# Patient Record
Sex: Female | Born: 1950 | Race: Black or African American | Hispanic: No | Marital: Single | Smoking: Current every day smoker
Health system: Southern US, Community
[De-identification: ages and names within clinical notes are randomized; demographics above are authoritative.]

## PROBLEM LIST (undated history)

## (undated) DIAGNOSIS — E119 Type 2 diabetes mellitus without complications: Secondary | ICD-10-CM

## (undated) DIAGNOSIS — G40909 Epilepsy, unspecified, not intractable, without status epilepticus: Secondary | ICD-10-CM

## (undated) DIAGNOSIS — R911 Solitary pulmonary nodule: Secondary | ICD-10-CM

## (undated) DIAGNOSIS — I10 Essential (primary) hypertension: Secondary | ICD-10-CM

## (undated) DIAGNOSIS — R569 Unspecified convulsions: Secondary | ICD-10-CM

## (undated) DIAGNOSIS — E785 Hyperlipidemia, unspecified: Secondary | ICD-10-CM

## (undated) DIAGNOSIS — I219 Acute myocardial infarction, unspecified: Secondary | ICD-10-CM

## (undated) DIAGNOSIS — C3490 Malignant neoplasm of unspecified part of unspecified bronchus or lung: Secondary | ICD-10-CM

## (undated) DIAGNOSIS — I2510 Atherosclerotic heart disease of native coronary artery without angina pectoris: Secondary | ICD-10-CM

## (undated) DIAGNOSIS — C50911 Malignant neoplasm of unspecified site of right female breast: Secondary | ICD-10-CM

## (undated) DIAGNOSIS — I82409 Acute embolism and thrombosis of unspecified deep veins of unspecified lower extremity: Secondary | ICD-10-CM

## (undated) DIAGNOSIS — C50919 Malignant neoplasm of unspecified site of unspecified female breast: Secondary | ICD-10-CM

## (undated) DIAGNOSIS — F419 Anxiety disorder, unspecified: Secondary | ICD-10-CM

## (undated) HISTORY — PX: IVC FILTER PLACEMENT (ARMC HX): HXRAD1551

## (undated) HISTORY — DX: Epilepsy, unspecified, not intractable, without status epilepticus: G40.909

## (undated) HISTORY — DX: Atherosclerotic heart disease of native coronary artery without angina pectoris: I25.10

## (undated) HISTORY — PX: LEG SURGERY: SHX1003

## (undated) HISTORY — DX: Malignant neoplasm of unspecified part of unspecified bronchus or lung: C34.90

## (undated) HISTORY — DX: Malignant neoplasm of unspecified site of right female breast: C50.911

## (undated) HISTORY — DX: Hyperlipidemia, unspecified: E78.5

## (undated) HISTORY — DX: Type 2 diabetes mellitus without complications: E11.9

## (undated) HISTORY — PX: ABDOMINAL HYSTERECTOMY: SHX81

## (undated) HISTORY — DX: Solitary pulmonary nodule: R91.1

## (undated) HISTORY — DX: Acute embolism and thrombosis of unspecified deep veins of unspecified lower extremity: I82.409

## (undated) HISTORY — DX: Acute myocardial infarction, unspecified: I21.9

## (undated) HISTORY — DX: Essential (primary) hypertension: I10

---

## 2005-02-18 ENCOUNTER — Emergency Department: Payer: Self-pay | Admitting: Emergency Medicine

## 2005-05-05 ENCOUNTER — Emergency Department: Payer: Self-pay | Admitting: Emergency Medicine

## 2005-05-10 ENCOUNTER — Emergency Department: Payer: Self-pay | Admitting: Emergency Medicine

## 2006-07-21 ENCOUNTER — Emergency Department: Payer: Self-pay | Admitting: Emergency Medicine

## 2007-02-19 ENCOUNTER — Emergency Department: Payer: Self-pay | Admitting: Emergency Medicine

## 2007-03-12 ENCOUNTER — Emergency Department: Payer: Self-pay | Admitting: Emergency Medicine

## 2007-04-12 ENCOUNTER — Emergency Department: Payer: Self-pay | Admitting: Emergency Medicine

## 2007-08-15 ENCOUNTER — Emergency Department: Payer: Self-pay | Admitting: Emergency Medicine

## 2007-10-17 ENCOUNTER — Ambulatory Visit: Payer: Self-pay | Admitting: Internal Medicine

## 2007-12-13 ENCOUNTER — Inpatient Hospital Stay: Payer: Self-pay | Admitting: Internal Medicine

## 2007-12-22 ENCOUNTER — Emergency Department: Payer: Self-pay | Admitting: Emergency Medicine

## 2007-12-24 ENCOUNTER — Emergency Department: Payer: Self-pay | Admitting: Emergency Medicine

## 2008-01-02 ENCOUNTER — Ambulatory Visit: Payer: Self-pay | Admitting: Vascular Surgery

## 2008-01-08 ENCOUNTER — Emergency Department: Payer: Self-pay | Admitting: Internal Medicine

## 2008-01-10 ENCOUNTER — Emergency Department: Payer: Self-pay | Admitting: Emergency Medicine

## 2008-04-24 ENCOUNTER — Emergency Department: Payer: Self-pay | Admitting: Emergency Medicine

## 2008-10-10 ENCOUNTER — Emergency Department: Payer: Self-pay | Admitting: Emergency Medicine

## 2009-05-06 ENCOUNTER — Emergency Department: Payer: Self-pay | Admitting: Emergency Medicine

## 2010-03-25 ENCOUNTER — Emergency Department: Payer: Self-pay | Admitting: Internal Medicine

## 2010-04-28 ENCOUNTER — Emergency Department: Payer: Self-pay | Admitting: Emergency Medicine

## 2010-07-22 ENCOUNTER — Emergency Department: Payer: Self-pay | Admitting: Emergency Medicine

## 2010-09-08 ENCOUNTER — Emergency Department: Payer: Self-pay | Admitting: Internal Medicine

## 2010-09-28 ENCOUNTER — Emergency Department: Payer: Self-pay | Admitting: Emergency Medicine

## 2010-09-29 ENCOUNTER — Emergency Department: Payer: Self-pay | Admitting: Emergency Medicine

## 2010-10-21 ENCOUNTER — Ambulatory Visit: Payer: Self-pay | Admitting: Internal Medicine

## 2010-10-29 ENCOUNTER — Emergency Department: Payer: Self-pay | Admitting: Emergency Medicine

## 2010-11-13 ENCOUNTER — Emergency Department: Payer: Self-pay | Admitting: Unknown Physician Specialty

## 2011-03-25 ENCOUNTER — Emergency Department: Payer: Self-pay | Admitting: Internal Medicine

## 2011-11-24 ENCOUNTER — Emergency Department: Payer: Self-pay | Admitting: *Deleted

## 2011-11-24 LAB — URINALYSIS, COMPLETE
Glucose,UR: NEGATIVE mg/dL (ref 0–75)
Ketone: NEGATIVE
Leukocyte Esterase: NEGATIVE
Nitrite: NEGATIVE
Protein: 30
RBC,UR: 7 /HPF (ref 0–5)
Squamous Epithelial: 12
WBC UR: 1 /HPF (ref 0–5)

## 2011-11-24 LAB — COMPREHENSIVE METABOLIC PANEL
Albumin: 3.6 g/dL (ref 3.4–5.0)
Calcium, Total: 8.8 mg/dL (ref 8.5–10.1)
Chloride: 103 mmol/L (ref 98–107)
Co2: 32 mmol/L (ref 21–32)
EGFR (African American): >60
Potassium: 3.7 mmol/L (ref 3.5–5.1)
Total Protein: 8 g/dL (ref 6.4–8.2)

## 2011-11-24 LAB — CBC
HCT: 44.7 % (ref 35.0–47.0)
HGB: 14.7 g/dL (ref 12.0–16.0)
MCH: 27.4 pg (ref 26.0–34.0)
MCHC: 32.8 g/dL (ref 32.0–36.0)
MCV: 84 fL (ref 80–100)
RBC: 5.35 10*6/uL — ABNORMAL HIGH (ref 3.80–5.20)
RDW: 15.9 % — ABNORMAL HIGH (ref 11.5–14.5)

## 2011-11-24 LAB — LIPASE, BLOOD: Lipase: 43 U/L — ABNORMAL LOW (ref 73–393)

## 2013-04-10 ENCOUNTER — Ambulatory Visit: Payer: Self-pay | Admitting: Podiatry

## 2013-04-11 ENCOUNTER — Emergency Department: Payer: Self-pay | Admitting: Emergency Medicine

## 2013-04-11 LAB — BASIC METABOLIC PANEL WITH GFR
Anion Gap: 5 — ABNORMAL LOW (ref 7–16)
BUN: 22 mg/dL — ABNORMAL HIGH (ref 7–18)
Calcium, Total: 8.7 mg/dL (ref 8.5–10.1)
Chloride: 107 mmol/L (ref 98–107)
Co2: 27 mmol/L (ref 21–32)
Creatinine: 0.95 mg/dL (ref 0.60–1.30)
EGFR (African American): >60
EGFR (Non-African Amer.): >60
Glucose: 181 mg/dL — ABNORMAL HIGH (ref 65–99)
Osmolality: 285 (ref 275–301)
Potassium: 3.2 mmol/L — ABNORMAL LOW (ref 3.5–5.1)
Sodium: 139 mmol/L (ref 136–145)

## 2013-04-11 LAB — PROTIME-INR
INR: 1.1
Prothrombin Time: 14 s (ref 11.5–14.7)

## 2013-04-11 LAB — APTT: Activated PTT: 28.8 s (ref 23.6–35.9)

## 2013-04-12 LAB — CBC WITH DIFFERENTIAL/PLATELET
Basophil %: 0.6 %
Eosinophil #: 0.3 10*3/uL (ref 0.0–0.7)
Eosinophil %: 2.8 %
Lymphocyte #: 2.7 10*3/uL (ref 1.0–3.6)
Lymphocyte %: 23.9 %
MCHC: 33.1 g/dL (ref 32.0–36.0)
MCV: 83 fL (ref 80–100)
Monocyte %: 11.1 %
Neutrophil #: 6.9 10*3/uL — ABNORMAL HIGH (ref 1.4–6.5)
Neutrophil %: 61.6 %
Platelet: 102 10*3/uL — ABNORMAL LOW (ref 150–440)
RBC: 4.8 10*6/uL (ref 3.80–5.20)
WBC: 11.2 10*3/uL — ABNORMAL HIGH (ref 3.6–11.0)

## 2013-04-12 LAB — HEPATIC FUNCTION PANEL A (ARMC)
Albumin: 2.7 g/dL — ABNORMAL LOW (ref 3.4–5.0)
Bilirubin, Direct: <0.1 mg/dL (ref 0.00–0.20)
SGOT(AST): 13 U/L — ABNORMAL LOW (ref 15–37)
Total Protein: 6.7 g/dL (ref 6.4–8.2)

## 2013-05-30 ENCOUNTER — Encounter: Payer: Self-pay | Admitting: *Deleted

## 2013-05-31 ENCOUNTER — Ambulatory Visit (INDEPENDENT_AMBULATORY_CARE_PROVIDER_SITE_OTHER): Payer: Medicare Other | Admitting: Podiatry

## 2013-05-31 ENCOUNTER — Encounter: Payer: Self-pay | Admitting: Podiatry

## 2013-05-31 VITALS — BP 154/79 | HR 83 | Resp 16 | Ht 65.0 in | Wt 210.0 lb

## 2013-05-31 DIAGNOSIS — M2040 Other hammer toe(s) (acquired), unspecified foot: Secondary | ICD-10-CM

## 2013-05-31 DIAGNOSIS — E1149 Type 2 diabetes mellitus with other diabetic neurological complication: Secondary | ICD-10-CM

## 2013-05-31 DIAGNOSIS — M7660 Achilles tendinitis, unspecified leg: Secondary | ICD-10-CM

## 2013-05-31 DIAGNOSIS — E1140 Type 2 diabetes mellitus with diabetic neuropathy, unspecified: Secondary | ICD-10-CM | POA: Insufficient documentation

## 2013-05-31 DIAGNOSIS — M722 Plantar fascial fibromatosis: Secondary | ICD-10-CM

## 2013-05-31 NOTE — Progress Notes (Signed)
Chamille presents today for her MRI read. 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 She has a retrocalcaneal spur. She states the majority of her pain is to the plantar left heel. She has friends who state she should just a diabetic shoe. She is not interested in surgical correction of the left heel at this time. She like to try diabetic shoe at all possible. She is a non-insulin-dependent diabetic. States that she is poorly controlled. Relates numbness and tingling in her toes at times.  Objective: Vital signs are stable she is alert and oriented x3. Pulses are palpable. Capillary fill time to digits one through 5 of the bilateral foot is immediate. Decreases sorry per Semmes-Weinstein monofilament to the bilateral foot. The tendon reflexes are intact. Skin is intact. Tenderness on palpation the tendo Achilles at its insertion site left heel. Tenderness on palpation in the tubercle the left heel. Hammertoe deformities 1 through 5 bilateral foot.  Assessment non-insulin-dependent diabetes mellitus poorly controlled with diabetic peripheral neuropathy. Hammertoe deformity. Tendo Achilles tendinitis.  Plan: We discussed etiology pathology conservative versus surgical therapies. We discussed the risks of not performing surgery. At this point in time we did go ahead and order custom molded diabetic shoes. And I will followup with her once the cement.

## 2013-12-04 ENCOUNTER — Emergency Department: Payer: Self-pay | Admitting: Emergency Medicine

## 2014-05-03 ENCOUNTER — Encounter: Payer: Self-pay | Admitting: *Deleted

## 2014-05-03 NOTE — Progress Notes (Signed)
Diabetic shoes not approved by family doctor. Had tried contacting pt with post card and phone call. No answer on why shoes may not be approved. Pt will need to come back into office if want to try for diabetic shoes and also will need to be scanned for inserts.

## 2014-08-01 LAB — APTT: Activated PTT: 26.2 secs (ref 23.6–35.9)

## 2014-08-01 LAB — CBC
HCT: 46.2 % (ref 35.0–47.0)
HGB: 15 g/dL (ref 12.0–16.0)
MCH: 27.2 pg (ref 26.0–34.0)
MCHC: 32.4 g/dL (ref 32.0–36.0)
MCV: 84 fL (ref 80–100)
Platelet: 158 10*3/uL (ref 150–440)
RBC: 5.51 10*6/uL — ABNORMAL HIGH (ref 3.80–5.20)
RDW: 16 % — ABNORMAL HIGH (ref 11.5–14.5)
WBC: 9.9 10*3/uL (ref 3.6–11.0)

## 2014-08-01 LAB — PROTIME-INR
INR: 1.1
PROTHROMBIN TIME: 13.6 s (ref 11.5–14.7)

## 2014-08-01 LAB — BASIC METABOLIC PANEL
Anion Gap: 7 (ref 7–16)
BUN: 24 mg/dL — ABNORMAL HIGH (ref 7–18)
Calcium, Total: 8.8 mg/dL (ref 8.5–10.1)
Chloride: 100 mmol/L (ref 98–107)
Co2: 29 mmol/L (ref 21–32)
Creatinine: 0.97 mg/dL (ref 0.60–1.30)
EGFR (African American): >60
EGFR (Non-African Amer.): >60
GLUCOSE: 260 mg/dL — AB (ref 65–99)
Osmolality: 285 (ref 275–301)
Potassium: 3.4 mmol/L — ABNORMAL LOW (ref 3.5–5.1)
Sodium: 136 mmol/L (ref 136–145)

## 2014-08-01 LAB — TROPONIN I: TROPONIN-I: 0.08 ng/mL — AB

## 2014-08-01 LAB — PRO B NATRIURETIC PEPTIDE: B-Type Natriuretic Peptide: 226 pg/mL — ABNORMAL HIGH (ref 0–125)

## 2014-08-02 ENCOUNTER — Inpatient Hospital Stay: Payer: Self-pay | Admitting: Internal Medicine

## 2014-08-02 DIAGNOSIS — I2510 Atherosclerotic heart disease of native coronary artery without angina pectoris: Secondary | ICD-10-CM

## 2014-08-02 DIAGNOSIS — I10 Essential (primary) hypertension: Secondary | ICD-10-CM

## 2014-08-02 DIAGNOSIS — I214 Non-ST elevation (NSTEMI) myocardial infarction: Secondary | ICD-10-CM

## 2014-08-02 DIAGNOSIS — Z720 Tobacco use: Secondary | ICD-10-CM

## 2014-08-02 HISTORY — PX: CARDIAC CATHETERIZATION: SHX172

## 2014-08-02 HISTORY — PX: CORONARY ANGIOPLASTY: SHX604

## 2014-08-02 LAB — CBC WITH DIFFERENTIAL/PLATELET
BASOS PCT: 0.7 %
Basophil #: 0.1 10*3/uL (ref 0.0–0.1)
Eosinophil #: 0.2 10*3/uL (ref 0.0–0.7)
Eosinophil %: 2.6 %
HCT: 42.1 % (ref 35.0–47.0)
HGB: 13.9 g/dL (ref 12.0–16.0)
Lymphocyte #: 2 10*3/uL (ref 1.0–3.6)
Lymphocyte %: 24.2 %
MCH: 27.5 pg (ref 26.0–34.0)
MCHC: 32.9 g/dL (ref 32.0–36.0)
MCV: 84 fL (ref 80–100)
Monocyte #: 0.7 x10 3/mm (ref 0.2–0.9)
Monocyte %: 8.6 %
NEUTROS PCT: 63.9 %
Neutrophil #: 5.3 10*3/uL (ref 1.4–6.5)
Platelet: 146 10*3/uL — ABNORMAL LOW (ref 150–440)
RBC: 5.04 10*6/uL (ref 3.80–5.20)
RDW: 15.9 % — ABNORMAL HIGH (ref 11.5–14.5)
WBC: 8.2 10*3/uL (ref 3.6–11.0)

## 2014-08-02 LAB — TROPONIN I
Troponin-I: 0.1 ng/mL — ABNORMAL HIGH
Troponin-I: 0.19 ng/mL — ABNORMAL HIGH

## 2014-08-02 LAB — BASIC METABOLIC PANEL
ANION GAP: 7 (ref 7–16)
BUN: 20 mg/dL — ABNORMAL HIGH (ref 7–18)
CREATININE: 0.72 mg/dL (ref 0.60–1.30)
Calcium, Total: 8.7 mg/dL (ref 8.5–10.1)
Chloride: 101 mmol/L (ref 98–107)
Co2: 28 mmol/L (ref 21–32)
EGFR (Non-African Amer.): >60
Glucose: 285 mg/dL — ABNORMAL HIGH (ref 65–99)
OSMOLALITY: 285 (ref 275–301)
POTASSIUM: 3.2 mmol/L — AB (ref 3.5–5.1)
Sodium: 136 mmol/L (ref 136–145)

## 2014-08-02 LAB — CK-MB
CK-MB: 0.8 ng/mL (ref 0.5–3.6)
CK-MB: 1.1 ng/mL (ref 0.5–3.6)
CK-MB: 1.3 ng/mL (ref 0.5–3.6)

## 2014-08-03 LAB — BASIC METABOLIC PANEL
Anion Gap: 6 — ABNORMAL LOW (ref 7–16)
BUN: 14 mg/dL (ref 7–18)
CALCIUM: 8.2 mg/dL — AB (ref 8.5–10.1)
CHLORIDE: 108 mmol/L — AB (ref 98–107)
CREATININE: 0.68 mg/dL (ref 0.60–1.30)
Co2: 27 mmol/L (ref 21–32)
Glucose: 164 mg/dL — ABNORMAL HIGH (ref 65–99)
Osmolality: 285 (ref 275–301)
POTASSIUM: 3.3 mmol/L — AB (ref 3.5–5.1)
Sodium: 141 mmol/L (ref 136–145)

## 2014-08-03 LAB — CK TOTAL AND CKMB (NOT AT ARMC): CK, Total: 138 U/L (ref 26–192)

## 2014-08-03 LAB — CK-MB
CK-MB: 12.4 ng/mL — ABNORMAL HIGH (ref 0.5–3.6)
CK-MB: 12.6 ng/mL — AB (ref 0.5–3.6)

## 2014-08-03 LAB — TROPONIN I: Troponin-I: 4.8 ng/mL — ABNORMAL HIGH

## 2014-08-05 ENCOUNTER — Encounter: Payer: Self-pay | Admitting: Cardiovascular Disease

## 2014-08-06 ENCOUNTER — Encounter: Payer: Self-pay | Admitting: *Deleted

## 2014-08-08 ENCOUNTER — Encounter: Payer: Medicare Other | Admitting: Cardiovascular Disease

## 2014-08-08 ENCOUNTER — Encounter (INDEPENDENT_AMBULATORY_CARE_PROVIDER_SITE_OTHER): Payer: Self-pay

## 2014-08-08 ENCOUNTER — Encounter: Payer: Self-pay | Admitting: Cardiovascular Disease

## 2014-08-08 VITALS — BP 130/82 | Ht 65.0 in | Wt 213.5 lb

## 2014-08-14 ENCOUNTER — Ambulatory Visit (INDEPENDENT_AMBULATORY_CARE_PROVIDER_SITE_OTHER): Payer: Medicare Other | Admitting: Physician Assistant

## 2014-08-14 ENCOUNTER — Encounter: Payer: Self-pay | Admitting: Physician Assistant

## 2014-08-14 ENCOUNTER — Telehealth: Payer: Self-pay | Admitting: *Deleted

## 2014-08-14 VITALS — BP 128/84 | HR 75 | Ht 65.0 in | Wt 210.2 lb

## 2014-08-14 DIAGNOSIS — I252 Old myocardial infarction: Secondary | ICD-10-CM

## 2014-08-14 DIAGNOSIS — E785 Hyperlipidemia, unspecified: Secondary | ICD-10-CM

## 2014-08-14 DIAGNOSIS — T148XXA Other injury of unspecified body region, initial encounter: Secondary | ICD-10-CM

## 2014-08-14 DIAGNOSIS — I10 Essential (primary) hypertension: Secondary | ICD-10-CM

## 2014-08-14 DIAGNOSIS — I82402 Acute embolism and thrombosis of unspecified deep veins of left lower extremity: Secondary | ICD-10-CM

## 2014-08-14 DIAGNOSIS — I2510 Atherosclerotic heart disease of native coronary artery without angina pectoris: Secondary | ICD-10-CM

## 2014-08-14 NOTE — Progress Notes (Signed)
This encounter was created in error - please disregard.

## 2014-08-14 NOTE — Telephone Encounter (Signed)
Attempted to call patient at [phone] to inform patient her ultrasound is scheduled for 08/19/14 at 3 pm at the imaging center on [address]   LVM

## 2014-08-14 NOTE — Progress Notes (Signed)
Patient Name: Dawn Allen, Dawn Allen 1950-12-21, MRN [id number]  Date of Encounter: 08/14/2014  Primary Care Provider:  Lorelee Market, MD Primary Cardiologist:  Dr. Fletcher Anon, MD  Patient Profile:  63 y.o. female with history CAD with history of NSTEMI 07/2014 s/p PCI/DES to mRCA, HTN, HLD, DM2 presents for hospital follow up after recent admission to [facility] from 12/11-12/12 for NSTEMI.    Problem List:   Past Medical History  Diagnosis Date  . Hypertension   . DVT (deep venous thrombosis)     LEFT LEG   . MI (myocardial infarction)   . Diabetes mellitus without complication   . Seizure disorder   . Coronary artery disease     a. NSTEMI cath 08/02/14: LM nl, pLAD 50%, mLCx 30%, mRCA 95% s/p PCI/DES, 2nd lesion 40%, EF 55%  . HLD (hyperlipidemia)    Past Surgical History  Procedure Laterality Date  . Abdominal hysterectomy      PARTIAL   . Leg surgery Right     BLOOD CLOT  . Cardiac catheterization  08/02/2014  . Coronary angioplasty  08/02/2014    drug eluting stent placement     Allergies:  Allergies  Allergen Reactions  . Penicillins      HPI:  63 y.o. female with the above problem list presents for hospital follow up with recently diagnosed CAD s/p NSTEMI s/p PCI/DES to [facility] on 08/02/14. She also has history of HTN, HLD, DM2, recurrent DVT, and seizure disorder. Patient was without any prior known cardiac history. She presented to [facility] ED on 08/02/14 with substernal chest tightness that radiated to her left arm that was associated with SOB. Troponin peaked at 0.19. EKG was NSR with possible old inferior infarct. Cardiac cath showed 95% stenosis of mRCA s/p PCI/DES (see details in [place]).   She feels well today. She does have some bruising along the right radial wrist that is stable per her report. No knots or bleeding. Full ROM of the wrist and arm. No chest pain or SOB. She is taking all of her medications as prescribed. She is advancing her activity as tolerated  without issues.    Home Medications:  Prior to Admission medications   Medication Sig Start Date End Date Taking? Authorizing Provider  atorvastatin (LIPITOR) 40 MG tablet Take 40 mg by mouth daily.    Historical Provider, MD  carvedilol (COREG) 25 MG tablet Take 25 mg by mouth 2 (two) times daily with a meal.     Historical Provider, MD  clopidogrel (PLAVIX) 75 MG tablet Take 75 mg by mouth daily.    Historical Provider, MD  lisinopril-hydrochlorothiazide (PRINZIDE,ZESTORETIC) 20-25 MG per tablet Take 1 tablet by mouth daily.  07/26/14   Historical Provider, MD  metFORMIN (GLUCOPHAGE) 1000 MG tablet Take 1,000 mg by mouth 2 (two) times daily with a meal.    Historical Provider, MD  metoprolol tartrate (LOPRESSOR) 25 MG tablet Take 25 mg by mouth 2 (two) times daily.    Historical Provider, MD  nitroGLYCERIN (NITROSTAT) 0.4 MG SL tablet Place 0.4 mg under the tongue every 5 (five) minutes as needed for chest pain.    Historical Provider, MD  phenytoin (DILANTIN) 100 MG ER capsule Take 300 mg by mouth daily.     Historical Provider, MD  Rivaroxaban (XARELTO) 20 MG TABS tablet Take by mouth daily.    Historical Provider, MD     Weights: Filed Weights   08/14/14 1433  Weight: 210 lb 4 oz (95.369 kg)  Review of Systems:  As above. All other systems reviewed and are otherwise negative except as noted above.  Physical Exam:  Blood pressure 128/84, pulse 75, height 5\' 5"  (1.651 m), weight 210 lb 4 oz (95.369 kg).  General: Pleasant, NAD Psych: Normal affect. Neuro: Alert and oriented X 3. Moves all extremities spontaneously. HEENT: Normal  Neck: Supple without bruits or JVD. Lungs:  Resp regular and unlabored, CTA. Heart: RRR no s3, s4, or murmurs. Abdomen: Soft, non-tender, non-distended, BS + x 4.  Extremities: No clubbing, cyanosis or edema. Right radial wrist without bruits. 2+ pulse along right radial wrist. Resolving bruise along volar aspect of right radial wrist.     Accessory Clinical Findings:  EKG - NSR, 75, poor R wave progression, old inferior infarct, no significant st/t changes  Assessment & Plan:  1. CAD with history of NSTEMI 07/2014 s/p PCI/DES to mRCA: -No further chest pain or SOB -Continue Plavix 75 mg for at least 12 months (on Xarelto rather than aspirin 2/2 recurrent DVT) -Lopressor 25 mg bid -SL NTG prn -Cardiac rehab -Smoking cessation   2. Right radial wrist bruising: -Bruising appears to be resolving -Will check an upper extremity US to make sure -No bruits heard on exam/pulses intact  -Warm compresses -Advised patient this will take time to resolve  3. HTN: -Lisinopril 20 mg  -Lopressor as above  4. HLD: -Lipitor 40 mg   5. DM2: -Metformin 1000 mg bid per PCP  6. Recurrent DVT: -Xarelto 20 mg  7. Seizure disorder: -Dilantin 100 mg 3 capsules daily    Christell Faith, PA-C [facility] [address], [place] [address] [phone] [place] 08/14/2014, 5:13 PM

## 2014-08-14 NOTE — Patient Instructions (Signed)
You are scheduled for a Ultrasound of your upper right extremity  Your procedure will be at 3 pm [facility] [address]     Your physician recommends that you continue on your current medications as directed. Please refer to the Current Medication list given to you today.  Your physician recommends that you schedule a follow-up appointment in:  1 month with Christell Faith PA or Dr. Fletcher Anon

## 2014-08-19 NOTE — Telephone Encounter (Signed)
Patient stated she could not make this appointment  She needs at least 3 days in advance to arrange transportation   Rescheduled patients ultrasound for 08/22/14 at 0900 at [facility]  Patient to arrive at [place]   Patient does not have working phone  She stated she will call later today for new date and time

## 2014-08-20 NOTE — Telephone Encounter (Signed)
What's the status on her getting this done?

## 2014-08-21 NOTE — Telephone Encounter (Signed)
LVM (with friend of patient as requested by patient) to inform patient of ultrasound date and time  Patient does not have working number

## 2014-08-22 ENCOUNTER — Telehealth: Payer: Self-pay

## 2014-08-22 ENCOUNTER — Ambulatory Visit: Payer: Self-pay | Admitting: Physician Assistant

## 2014-08-22 NOTE — Telephone Encounter (Signed)
Calling to let us know that pt is currently at registration

## 2014-08-28 NOTE — Telephone Encounter (Signed)
Patient stated US of her arm has been completed

## 2014-08-28 NOTE — Telephone Encounter (Signed)
LVM 1/6

## 2014-09-10 ENCOUNTER — Encounter: Payer: Self-pay | Admitting: Cardiovascular Disease

## 2014-12-14 NOTE — H&P (Signed)
PATIENT NAME:  Dawn Allen, Dawn Allen MR#:  [id number] DATE OF BIRTH:  Jun 01, 1951  DATE OF ADMISSION:  08/01/2014  PRIMARY CARE PHYSICIAN: Threasa Alpha, FNP-BC  REFERRING PHYSICIAN: Brunilda Payor A. Edd Fabian, MD    CHIEF COMPLAINT: Chest pain.   HISTORY OF PRESENT ILLNESS: Dawn Allen is a 64 year old female with a history of DVT, hypertension, hyperlipidemia, continued tobacco use comes to the Emergency Department with complaints of chest pain. The patient has been experiencing this pain since this afternoon. The pain is in the retrosternal area, center of the chest. The patient states washed her cloths, was putting the cloths on the drying line, started to experience pain of sudden onset, 10/10 in intensity. The pain is worse taking a deep breath. Denies having any cough, shortness of breath. Concerning the ongoing chest pain the patient presented to the Emergency Department, workup in the Emergency Department, with mild elevation of the troponin of 0.08. The patient denies any previous history of chest pain, any workup in regards to this. The patient currently denies having any chest pain.   PAST MEDICAL HISTORY:  1.  History of DVT, on chronic anticoagulation.  2.  Diabetes mellitus type 2.  3.  History of seizures.  4.  Hypertension.   PAST SURGICAL HISTORY: Partial hysterectomy.   ALLERGIES: PENICILLIN.   HOME MEDICATIONS:  1.  Xarelto 20 mg once a day.  2.  Metformin 1000 mg 2 times a day.  3.  Lisinopril 20 mg once a day.  4.  Dilantin 100 mg 3 capsules once a day.   SOCIAL HISTORY: Continues to smoke 1 pack a day. Denies drinking alcohol or using illicit drugs. Lives by herself. Currently not working.   FAMILY HISTORY: Strong family history of coronary artery disease, diabetes mellitus, hypertension.   REVIEW OF SYSTEMS:  CONSTITUTIONAL: Experiencing generalized weakness.  EYES: No change in vision.  EARS, NOSE, AND THROAT: No change in hearing.  RESPIRATORY: No cough, shortness of breath.   CARDIOVASCULAR: Has chest pain.  GASTROINTESTINAL: No nausea, vomiting, abdominal pain.  GENITOURINARY: No dysuria or hematuria.  HEMATOLOGIC: No easy bruising or bleeding.  SKIN: No rash or lesion.  MUSCULOSKELETAL: No joint pains and aches.  NEUROLOGIC: No weakness or numbness in any part of the body.   PHYSICAL EXAMINATION:  GENERAL: This of well-built, well-nourished, age-appropriate female laying down in the bed not in distress.  VITAL SIGNS: Temperature 97.8, pulse 78, blood pressure 171/115, respiratory rate of 19 oxygen saturation is 97% on room air.  HEENT: Head normocephalic, atraumatic. There is no scleral icterus. Conjunctivae normal. Pupils equal and react to light. Mucous membranes moist. No pharyngeal erythema.  NECK: Supple. No lymphadenopathy. No JVD. No carotid bruit. No thyromegaly.  CHEST: Focal tenderness on the left parasternal area.  LUNGS: Bilaterally clear to auscultation.  HEART: S1, S2 regular. No murmurs are heard.  ABDOMEN: Bowel sounds plus. Soft, nontender, nondistended. No hepatosplenomegaly.  EXTREMITIES: No pedal edema. Pulses 2+.  NEUROLOGIC: The patient is alert, oriented to place, person, and time. Cranial nerves II through XII intact. Motor 5/5 in upper and lower extremities.   SKIN: No rash or lesions.  MUSCULOSKELETAL: Good range of motion in all the extremities.   LABORATORY DATA: Chest x-ray, one view, portable: No acute cardiopulmonary disease.   Troponin 0.08. CBC: WBC of 9.9, hemoglobin 15, platelet count of 158. CMP: BUN 24, creatinine of 0.97, potassium 3.4, glucose 260. Rest of all the values are within normal limits. BNP 226.   ASSESSMENT AND PLAN:  Dawn Allen is a 64 year old female who comes to the Emergency Department with complaints of chest pain.  1.  Chest pain. Initial set of cardiac enzymes 0.08, however, the patient has multiple risk factors, hypertension, hyperlipidemia, diabetes mellitus, and smoking, age and gender. Admit the  patient to the monitored bed, cycle cardiac enzymes x 3. If negative, consider getting a dobutamine  nuclear stress test.  2.  Hypertension, currently well controlled.  3.  Diabetes mellitus, on oral medications.  4.  Keep the patient on deep vein thrombosis prophylaxis with Xarelto.   TIME SPENT: 50 minutes.   ____________________________ Dawn Becton, MD pv:AT D: 08/02/2014 02:08:17 ET T: 08/02/2014 02:45:36 ET JOB#: [id number]  cc: Dawn Becton, MD, <Dictator> Dawn Becton MD ELECTRONICALLY SIGNED 08/02/2014 21:38

## 2014-12-14 NOTE — Consult Note (Signed)
Brief Consult Note: Diagnosis: NSTEMI.   Patient was seen by consultant.   Consult note dictated.   Recommend to proceed with surgery or procedure.   Comments: recommend cardiac cath today.  Electronic Signatures: Kathlyn Sacramento (MD)  (Signed 11-Dec-15 09:13)  Authored: Brief Consult Note   Last Updated: 11-Dec-15 09:13 by Kathlyn Sacramento (MD)

## 2014-12-14 NOTE — Discharge Summary (Signed)
PATIENT NAME:  Dawn Allen, Dawn Allen MR#:  [id number] DATE OF BIRTH:  03/11/1951  DATE OF ADMISSION:  08/02/2014 DATE OF DISCHARGE:  08/03/2014  DISCHARGE DIAGNOSES: 1.  Chest pain secondary to non-ST-elevation myocardial infarction status post cardiac with drug-eluting stent in right coronary artery.  2.  Hypertension.  3.  Type 2 diabetes mellitus.  4.  Seizure disorder. 5.  History of recurrent deep venous thromboses.   ALLERGIES: PENICILLIN.   DISCHARGE MEDICATIONS:  Xarelto 20 mg p.o. daily, lisinopril 20 mg p.o. daily, Dilantin 100 mg 3 capsules daily, metformin 1 gram p.o. b.i.d.  Start the metformin on 14th because of cardiac cath, Plavix 75 mg p.o. daily, lisinopril 20 mg p.o. daily, metoprolol tartrate 25 mg p.o. b.i.d. atorvastatin 40 mg p.o. daily, nitroglycerin sublingual p.r.n. for chest pain.  Metoprolol, statins and Plavix are new medications and also nitroglycerin is new. We gave her 20 tablets of nitroglycerin in case she gets chest pain.   PRIMARY DOCTOR: Meindert A. Brunetta Genera, MD.  CARDIOLOGIST CONSULTRogue Jury A. Fletcher Anon, MD.   DIET: Low-sodium, low-fat diet ADA diet.   DISCHARGE INSTRUCTION:  The patient advised to stop metformin until  December 13 and start on December 14.   HOSPITAL COURSE:  1.  This is a 64 year old female patient who came in because of chest pain. The patient noticed sudden onset of chest pain associated with deep breath.  Please look at history and physical for full details.  The patient's chest x-ray was normal, and troponin 1st one was 0.08. She has multiple risk factors hypertension, hyperlipidemia, diabetes, and smoking, so she is admitted to telemetry and patient's 2nd troponin was 0.08, 3rd one 0.10.  The patient was seen by cardiology and the patient was taken to cardiac cath on December 11.  Cardiac cath revealed EF 55% and 95% lesion in the mid RCA. The patient had a drug-eluting stent in RCA and was transferred to ICU for post PCI monitoring. The  patient received IV fluids for her cardiac cath and she was monitored post PCI in ICU.  Her symptoms are improved.  Denies any chest pain. The patient started on Plavix and she takes Xarelto for recurrent DVTs.  So, Dr. Fletcher Anon mentioned that the patient can take Plavix and Xarelto and we cannot give aspirin because of bleeding risk with 3 agents.  Also, same is advised to the patient.  The patient advised to continue Plavix and see Dr. Fletcher Anon in a week. She can also continue metoprolol and lisinopril for high blood pressure and we have added simvastatin 40 mg daily.  2.  Diabetes mellitus type 2.  She takes metformin at home. She can start it on December 14.  3.  History of seizure disorder.  She has a history of petit mal seizures, takes Dilantin for a long time and she says she does not want to stop it and sees Dr. Brunetta Genera for that. Advised to continue Dilantin and also advised to follow up with neurologist.  3.  History of recurrent deep venous thromboses.  Xarelto 20 mg p.o. daily. Advised to continue that.   LABORATORY DATA: The patient's white count is normal. CT of chest  is done to evaluate pulmonary emboli. The patient's CT chest is negative for PE and 2 small pulmonary nodules are present in left upper lobe. The patient can have a repeat CT in 3-6 months.  Potassium 3.2 and she can get the replacement for that.  PHYSICAL EXAMINATION: VITAL SIGNS:  The patient's discharge  blood pressure is 134/58, heart rate 89, temperature 98.4,  saturation 98% on room air.  GENERAL: She is alert, awake, and oriented, answering questions appropriately.  CARDIOVASCULAR: S1, S2 regular.  LUNGS: Clear to auscultation. No wheeze. No rales.  ABDOMEN: Soft, nontender, nondistended. Bowel sounds present.  NEUROLOGIC: Alert, awake, oriented.  No focal neurological deficit.   TIME SPENT ON DISCHARGE PREPARATION: More than 30 minutes.   ____________________________ Epifanio Lesches, MD sk:DT D: 08/03/2014  08:38:00 ET T: 08/03/2014 13:35:10 ET JOB#: [id number]  cc: Muhammad A. Fletcher Anon, MD Meindert A. Brunetta Genera, MD Epifanio Lesches, MD, <Dictator>   Epifanio Lesches MD ELECTRONICALLY SIGNED 08/06/2014 23:17

## 2014-12-18 NOTE — Consult Note (Signed)
PATIENT NAME:  Dawn Allen, Dawn Allen MR#:  [id number] DATE OF BIRTH:  November 30, 1950  DATE OF CONSULTATION:  08/02/2014  REFERRING PHYSICIAN:  Azucena Freed, MD CONSULTING PHYSICIAN:  Aubery Date A. Fletcher Anon, MD  REASON FOR CONSULTATION: Myocardial infarction.   HISTORY OF PRESENT ILLNESS: This is a 64 year old African American female with no previous cardiac history. She has known history of DVT, hypertension, hyperlipidemia, tobacco use and type 2 diabetes. She presented to the Emergency Room after she had chest pain yesterday around noon. It was substernal chest tightness with radiation to the left arm. It was intense in severity and lasted for more than 1 hour. She had associated shortness of breath and she felt like she was having a panic attack. She was noted to have mildly elevated troponin at 0.08 initially, which increased to 0.19. EKG showed sinus rhythm with possible old inferior infarct. She is currently chest pain-free.   PAST MEDICAL HISTORY: 1.  DVT, on chronic anticoagulation.  2.  Type 2 diabetes.  3.  Seizures.  4.  Hypertension.   PAST SURGICAL HISTORY: Includes partial hysterectomy.   ALLERGIES: PENICILLIN.   HOME MEDICATIONS: 1.  Xarelto 20 mg daily.  2.  Metformin 1000 mg twice daily.  3.  Lisinopril 20 mg once daily.  4.  Dilantin 300 mg once daily.   SOCIAL HISTORY: Remarkable for smoking 1 pack per day. She denies any alcohol or recreational drug use.   FAMILY HISTORY: Remarkable for coronary artery disease, diabetes and hypertension.   REVIEW OF SYSTEMS: A 10 point review was performed. It is negative other than what is mentioned in the HPI.  PHYSICAL EXAMINATION: GENERAL: The patient appears to be at her stated age and in no acute distress.  VITAL SIGNS: Blood pressure on presentation was elevated at 187/96; currently it is 144/84. Temperature is 98.2, pulse 90, and oxygen saturation is 97% on room air.  HEENT: Normocephalic, atraumatic.  NECK: No JVD or carotid  bruits.  CARDIOVASCULAR: Normal PMI. Normal S1 and S2 with no gallops. There is a 2/6 systolic ejection murmur at the aortic area.  ABDOMEN: Benign, nontender, and nondistended.  EXTREMITIES: No clubbing, cyanosis, or edema.  SKIN: Warm and dry with no rash.  PSYCHIATRIC: She is alert and oriented x3 with normal mood and affect.   DIAGNOSTIC DATA: EKG shows sinus rhythm with possible old inferior MI with Q waves.   Labs show a BNP of 226. Kidney function is normal. Troponin is 0.19. CBC is unremarkable, except for mild thrombocytopenia at 146.   CT of the chest showed no evidence of pulmonary embolism.   IMPRESSION: 1.  Non-ST elevation myocardial infarction.  2.  Uncontrolled hypertension.  3.  Tobacco use.  4.  Type 2 diabetes.   RECOMMENDATIONS: The patient's presentation is consistent with a small non-ST elevation myocardial infarction. I recommend continuing treatment with aspirin. I added metoprolol and atorvastatin. I recommend proceeding with cardiac catheterization and possible coronary intervention. Risks, benefits and alternatives were discussed with the patient. Further recommendations to follow after cardiac cath.   ____________________________ Mertie Clause. Fletcher Anon, MD maa:sb D: 08/02/2014 09:18:39 ET T: 08/02/2014 10:11:14 ET JOB#: [id number]  cc: Rogue Jury A. Fletcher Anon, MD, <Dictator> Wellington Hampshire MD ELECTRONICALLY SIGNED 08/23/2014 9:38

## 2015-02-05 ENCOUNTER — Emergency Department
Admission: EM | Admit: 2015-02-05 | Discharge: 2015-02-06 | Disposition: A | Discharge status: Home / Self Care | Payer: Medicare Other | Attending: Emergency Medicine | Admitting: Emergency Medicine

## 2015-02-05 ENCOUNTER — Encounter: Payer: Self-pay | Admitting: Emergency Medicine

## 2015-02-05 DIAGNOSIS — Z880 Allergy status to penicillin: Secondary | ICD-10-CM | POA: Diagnosis not present

## 2015-02-05 DIAGNOSIS — R1031 Right lower quadrant pain: Secondary | ICD-10-CM | POA: Diagnosis present

## 2015-02-05 DIAGNOSIS — Z79899 Other long term (current) drug therapy: Secondary | ICD-10-CM | POA: Insufficient documentation

## 2015-02-05 DIAGNOSIS — Z7901 Long term (current) use of anticoagulants: Secondary | ICD-10-CM | POA: Insufficient documentation

## 2015-02-05 DIAGNOSIS — K5730 Diverticulosis of large intestine without perforation or abscess without bleeding: Secondary | ICD-10-CM

## 2015-02-05 DIAGNOSIS — Z7902 Long term (current) use of antithrombotics/antiplatelets: Secondary | ICD-10-CM | POA: Diagnosis not present

## 2015-02-05 DIAGNOSIS — E1149 Type 2 diabetes mellitus with other diabetic neurological complication: Secondary | ICD-10-CM | POA: Diagnosis not present

## 2015-02-05 DIAGNOSIS — Z87891 Personal history of nicotine dependence: Secondary | ICD-10-CM | POA: Diagnosis not present

## 2015-02-05 DIAGNOSIS — I10 Essential (primary) hypertension: Secondary | ICD-10-CM | POA: Insufficient documentation

## 2015-02-05 DIAGNOSIS — R109 Unspecified abdominal pain: Secondary | ICD-10-CM

## 2015-02-05 NOTE — ED Notes (Signed)
Pt presents to ED via EMS with c/o RLQ abdominal pain radiates to flank, onset about a year, worse today. Pt was seen by her Dr. Today and discharge home.

## 2015-02-06 ENCOUNTER — Encounter: Payer: Self-pay | Admitting: Radiology

## 2015-02-06 ENCOUNTER — Emergency Department: Payer: Medicare Other

## 2015-02-06 DIAGNOSIS — K5730 Diverticulosis of large intestine without perforation or abscess without bleeding: Secondary | ICD-10-CM | POA: Diagnosis not present

## 2015-02-06 LAB — URINALYSIS COMPLETE WITH MICROSCOPIC (ARMC ONLY)
BACTERIA UA: NONE SEEN
Bilirubin Urine: NEGATIVE
Glucose, UA: NEGATIVE mg/dL
Ketones, ur: NEGATIVE mg/dL
LEUKOCYTES UA: NEGATIVE
Nitrite: NEGATIVE
Protein, ur: NEGATIVE mg/dL
Specific Gravity, Urine: 1.003 — ABNORMAL LOW (ref 1.005–1.030)
pH: 7 (ref 5.0–8.0)

## 2015-02-06 LAB — COMPREHENSIVE METABOLIC PANEL
ALT: 24 U/L (ref 14–54)
ANION GAP: 8 (ref 5–15)
AST: 20 U/L (ref 15–41)
Albumin: 3.4 g/dL — ABNORMAL LOW (ref 3.5–5.0)
Alkaline Phosphatase: 245 U/L — ABNORMAL HIGH (ref 38–126)
BUN: 16 mg/dL (ref 6–20)
CO2: 27 mmol/L (ref 22–32)
Calcium: 8.6 mg/dL — ABNORMAL LOW (ref 8.9–10.3)
Chloride: 99 mmol/L — ABNORMAL LOW (ref 101–111)
Creatinine, Ser: 0.78 mg/dL (ref 0.44–1.00)
GFR calc Af Amer: >60 mL/min (ref >60)
GFR calc non Af Amer: >60 mL/min (ref >60)
Glucose, Bld: 191 mg/dL — ABNORMAL HIGH (ref 65–99)
POTASSIUM: 3.1 mmol/L — AB (ref 3.5–5.1)
SODIUM: 134 mmol/L — AB (ref 135–145)
TOTAL PROTEIN: 7.2 g/dL (ref 6.5–8.1)
Total Bilirubin: 0.3 mg/dL (ref 0.3–1.2)

## 2015-02-06 MED ORDER — POLYETHYLENE GLYCOL 3350 17 G PO PACK
17.0000 g | PACK | Freq: Every day | ORAL | Status: DC
  Administered 2015-02-06: 17 g via ORAL
  Filled 2015-02-06 (×2): qty 1

## 2015-02-06 MED ORDER — KETOROLAC TROMETHAMINE 10 MG PO TABS: 10.0000 mg | ORAL_TABLET | Freq: Once | ORAL | Status: DC

## 2015-02-06 MED ORDER — SODIUM CHLORIDE 0.9 % IV BOLUS (SEPSIS)
1000.0000 mL | Freq: Once | INTRAVENOUS | Status: AC
  Administered 2015-02-06: 1000 mL via INTRAVENOUS

## 2015-02-06 MED ORDER — IOHEXOL 300 MG/ML  SOLN
100.0000 mL | Freq: Once | INTRAMUSCULAR | Status: AC | PRN
  Administered 2015-02-06: 100 mL via INTRAVENOUS

## 2015-02-06 MED ORDER — ONDANSETRON HCL 4 MG/2ML IJ SOLN
INTRAMUSCULAR | Status: AC
  Administered 2015-02-06: 4 mg via INTRAVENOUS
  Filled 2015-02-06: qty 2

## 2015-02-06 MED ORDER — ONDANSETRON HCL 4 MG/2ML IJ SOLN
4.0000 mg | Freq: Once | INTRAMUSCULAR | Status: AC
  Administered 2015-02-06: 4 mg via INTRAVENOUS

## 2015-02-06 MED ORDER — MORPHINE SULFATE 4 MG/ML IJ SOLN
4.0000 mg | Freq: Once | INTRAMUSCULAR | Status: AC
  Administered 2015-02-06: 4 mg via INTRAVENOUS

## 2015-02-06 MED ORDER — IOHEXOL 240 MG/ML SOLN
25.0000 mL | Freq: Once | INTRAMUSCULAR | Status: AC | PRN
  Administered 2015-02-06: 25 mL via ORAL

## 2015-02-06 MED ORDER — MORPHINE SULFATE 4 MG/ML IJ SOLN
INTRAMUSCULAR | Status: AC
  Administered 2015-02-06: 4 mg via INTRAVENOUS
  Filled 2015-02-06: qty 1

## 2015-02-06 NOTE — ED Notes (Signed)
CT notified that pt finished with contrast. 

## 2015-02-06 NOTE — ED Provider Notes (Signed)
[facility] Emergency Department Provider Note  ____________________________________________  Time seen: 11:45 PM  I have reviewed the triage vital signs and the nursing notes.   HISTORY  Chief Complaint Abdominal Pain      HPI Dawn Allen is a 64 y.o. female presents with bilateral lower quadrant pains times one year with acute worsening today. Of note patient saw her primary care physician today Dr. Brunetta Genera. Patient denied nausea no vomiting no diarrhea and no constipation. Patient denies any dysuria or urinary urgency or frequency.Patient current pain score is 10 out of 10. She denies any aggravating or alleviating factors.     Past Medical History  Diagnosis Date  . Hypertension   . DVT (deep venous thrombosis)     LEFT LEG   . MI (myocardial infarction)   . Diabetes mellitus without complication   . Seizure disorder   . Coronary artery disease     a. NSTEMI cath 08/02/14: LM nl, pLAD 50%, mLCx 30%, mRCA 95% s/p PCI/DES, 2nd lesion 40%, EF 55%  . HLD (hyperlipidemia)     Patient Active Problem List   Diagnosis Date Noted  . Coronary artery disease   . Hypertension   . DVT (deep venous thrombosis)   . HLD (hyperlipidemia)   . Achilles bursitis or tendinitis 05/31/2013  . 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 Other hammer toe (acquired) 05/31/2013  . 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 Abdominal hysterectomy      PARTIAL   . Leg surgery Right     BLOOD CLOT  . Cardiac catheterization  08/02/2014  . Coronary angioplasty  08/02/2014    drug eluting stent placement    Current Outpatient Rx  Name  Route  Sig  Dispense  Refill  . atorvastatin (LIPITOR) 40 MG tablet   Oral   Take 40 mg by mouth daily.         . cholecalciferol (VITAMIN D) 1000 UNITS tablet   Oral   Take 50,000 Units by mouth daily.         . clopidogrel (PLAVIX) 75 MG  tablet   Oral   Take 75 mg by mouth daily.         . empagliflozin (JARDIANCE) 25 MG TABS tablet   Oral   Take 25 mg by mouth daily.         Marland Kitchen lisinopril-hydrochlorothiazide (PRINZIDE,ZESTORETIC) 20-25 MG per tablet   Oral   Take 1 tablet by mouth daily.          . metFORMIN (GLUCOPHAGE) 1000 MG tablet   Oral   Take 1,000 mg by mouth 2 (two) times daily with a meal.         . metoprolol tartrate (LOPRESSOR) 25 MG tablet   Oral   Take 25 mg by mouth 2 (two) times daily.         . nitroGLYCERIN (NITROSTAT) 0.4 MG SL tablet   Sublingual   Place 0.4 mg under the tongue every 5 (five) minutes as needed for chest pain.         . phenytoin (DILANTIN) 100 MG ER capsule   Oral   Take 300 mg by mouth daily.          . Rivaroxaban (XARELTO) 20 MG TABS tablet   Oral   Take 20 mg by mouth daily.          . Saxagliptin-Metformin (KOMBIGLYZE XR) 2.12-998 MG TB24  Oral   Take by mouth.           Allergies Penicillins  Family History  Problem Relation Age of Onset  . Family history unknown: Yes    Social History History  Substance Use Topics  . Smoking status: Former Smoker -- 1.00 packs/day for 14 years    Quit date: 08/04/2014  . Smokeless tobacco: Never Used  . Alcohol Use: No    Review of Systems  Constitutional: Negative for fever. Eyes: Negative for visual changes. ENT: Negative for sore throat. Cardiovascular: Negative for chest pain. Respiratory: Negative for shortness of breath. Gastrointestinal: Positive for abdominal pain. Negative vomiting and diarrhea. Genitourinary: Negative for dysuria. Musculoskeletal: Negative for back pain. Skin: Negative for rash. Neurological: Negative for headaches, focal weakness or numbness.   10-point ROS otherwise negative.  ____________________________________________   PHYSICAL EXAM:  VITAL SIGNS: ED Triage Vitals  Enc Vitals Group     BP 02/05/15 2317 180/91 mmHg     Pulse Rate 02/05/15 2317  74     Resp 02/05/15 2317 18     Temp 02/05/15 2317 97.9 F (36.6 C)     Temp Source 02/05/15 2317 Oral     SpO2 02/05/15 2317 97 %     Weight --      Height --      Head Cir --      Peak Flow --      Pain Score 02/05/15 2318 9     Pain Loc --      Pain Edu? --      Excl. in [place]? --      Constitutional: Alert and oriented. Well appearing and in no distress. Eyes: Conjunctivae are normal. PERRL. Normal extraocular movements. ENT   Head: Normocephalic and atraumatic.   Nose: No congestion/rhinnorhea.   Mouth/Throat: Mucous membranes are moist.   Neck: No stridor. Hematological/Lymphatic/Immunilogical: No cervical lymphadenopathy. Cardiovascular: Normal rate, regular rhythm. Normal and symmetric distal pulses are present in all extremities. No murmurs, rubs, or gallops. Respiratory: Normal respiratory effort without tachypnea nor retractions. Breath sounds are clear and equal bilaterally. No wheezes/rales/rhonchi. Gastrointestinal: Patient tender to palpation right and left lower quadrant.. No distention. There is no CVA tenderness. Genitourinary: deferred Musculoskeletal: Nontender with normal range of motion in all extremities. No joint effusions.  No lower extremity tenderness nor edema. Neurologic:  Normal speech and language. No gross focal neurologic deficits are appreciated. Speech is normal.  Skin:  Skin is warm, dry and intact. No rash noted. Psychiatric: Mood and affect are normal. Speech and behavior are normal. Patient exhibits appropriate insight and judgment.  ____________________________________________    LABS (pertinent positives/negatives)  Labs Reviewed  COMPREHENSIVE METABOLIC PANEL - Abnormal; Notable for the following:    Sodium 134 (*)    Potassium 3.1 (*)    Chloride 99 (*)    Glucose, Bld 191 (*)    Calcium 8.6 (*)    Albumin 3.4 (*)    Alkaline Phosphatase 245 (*)    All other components within normal limits  URINALYSIS COMPLETEWITH  MICROSCOPIC ([facility] ONLY)       RADIOLOGY  CT abdomen and pelvis revealed diverticular disease without evidence of diverticulitis. Stool-filled rectosigmoid colon     INITIAL IMPRESSION / ASSESSMENT AND PLAN / ED COURSE  Pertinent labs & imaging results that were available during my care of the patient were reviewed by me and considered in my medical decision making (see chart for details).  History of physical exam, CT scan  of the abdomen and pelvis and lab data revealed no clear etiology for patient's abdominal pain. Could be secondary to constipation given stool-filled rectosigmoid region  ____________________________________________   FINAL CLINICAL IMPRESSION(S) / ED DIAGNOSES  Final diagnoses:  Abdominal pain, unspecified abdominal location  Diverticulosis of large intestine without hemorrhage      Gregor Hams, MD 02/06/15 [phone]

## 2015-02-06 NOTE — Discharge Instructions (Signed)
Abdominal Pain Many things can cause abdominal pain. Usually, abdominal pain is not caused by a disease and will improve without treatment. It can often be observed and treated at home. Your health care provider will do a physical exam and possibly order blood tests and X-rays to help determine the seriousness of your pain. However, in many cases, more time must pass before a clear cause of the pain can be found. Before that point, your health care provider may not know if you need more testing or further treatment. HOME CARE INSTRUCTIONS  Monitor your abdominal pain for any changes. The following actions may help to alleviate any discomfort you are experiencing:  Only take over-the-counter or prescription medicines as directed by your health care provider.  Do not take laxatives unless directed to do so by your health care provider.  Try a clear liquid diet (broth, tea, or water) as directed by your health care provider. Slowly move to a bland diet as tolerated. SEEK MEDICAL CARE IF:  You have unexplained abdominal pain.  You have abdominal pain associated with nausea or diarrhea.  You have pain when you urinate or have a bowel movement.  You experience abdominal pain that wakes you in the night.  You have abdominal pain that is worsened or improved by eating food.  You have abdominal pain that is worsened with eating fatty foods.  You have a fever. SEEK IMMEDIATE MEDICAL CARE IF:   Your pain does not go away within 2 hours.  You keep throwing up (vomiting).  Your pain is felt only in portions of the abdomen, such as the right side or the left lower portion of the abdomen.  You pass bloody or black tarry stools. MAKE SURE YOU:  Understand these instructions.   Will watch your condition.   Will get help right away if you are not doing well or get worse.  Document Released: 05/19/2005 Document Revised: 08/14/2013 Document Reviewed: 04/18/2013 [facility] Patient Information  2015 [place], [place]. This information is not intended to replace advice given to you by your health care provider. Make sure you discuss any questions you have with your health care provider.  Diverticulosis Diverticulosis is the condition that develops when small pouches (diverticula) form in the wall of your colon. Your colon, or large intestine, is where water is absorbed and stool is formed. The pouches form when the inside layer of your colon pushes through weak spots in the outer layers of your colon. CAUSES  No one knows exactly what causes diverticulosis. RISK FACTORS  Being older than 88. Your risk for this condition increases with age. Diverticulosis is rare in people younger than 40 years. By age 46, almost everyone has it.  Eating a low-fiber diet.  Being frequently constipated.  Being overweight.  Not getting enough exercise.  Smoking.  Taking over-the-counter pain medicines, like aspirin and ibuprofen. SYMPTOMS  Most people with diverticulosis do not have symptoms. DIAGNOSIS  Because diverticulosis often has no symptoms, health care providers often discover the condition during an exam for other colon problems. In many cases, a health care provider will diagnose diverticulosis while using a flexible scope to examine the colon (colonoscopy). TREATMENT  If you have never developed an infection related to diverticulosis, you may not need treatment. If you have had an infection before, treatment may include:  Eating more fruits, vegetables, and grains.  Taking a fiber supplement.  Taking a live bacteria supplement (probiotic).  Taking medicine to relax your colon. HOME CARE INSTRUCTIONS  Drink at least 6-8 glasses of water each day to prevent constipation.  Try not to strain when you have a bowel movement.  Keep all follow-up appointments. If you have had an infection before:  Increase the fiber in your diet as directed by your health care provider or  dietitian.  Take a dietary fiber supplement if your health care provider approves.  Only take medicines as directed by your health care provider. SEEK MEDICAL CARE IF:   You have abdominal pain.  You have bloating.  You have cramps.  You have not gone to the bathroom in 3 days. SEEK IMMEDIATE MEDICAL CARE IF:   Your pain gets worse.  Yourbloating becomes very bad.  You have a fever or chills, and your symptoms suddenly get worse.  You begin vomiting.  You have bowel movements that are bloody or black. MAKE SURE YOU:  Understand these instructions.  Will watch your condition.  Will get help right away if you are not doing well or get worse. Document Released: 05/06/2004 Document Revised: 08/14/2013 Document Reviewed: 07/04/2013 [facility] Patient Information 2015 [place], [place]. This information is not intended to replace advice given to you by your health care provider. Make sure you discuss any questions you have with your health care provider.

## 2015-02-27 ENCOUNTER — Inpatient Hospital Stay: Payer: Medicare Other

## 2015-02-27 ENCOUNTER — Emergency Department: Payer: Medicare Other

## 2015-02-27 ENCOUNTER — Inpatient Hospital Stay
Admission: EM | Admit: 2015-02-27 | Discharge: 2015-02-28 | DRG: 872 | Discharge status: Against Medical Advice | Payer: Medicare Other | Attending: Specialist | Admitting: Specialist

## 2015-02-27 DIAGNOSIS — K029 Dental caries, unspecified: Secondary | ICD-10-CM | POA: Diagnosis present

## 2015-02-27 DIAGNOSIS — Z79899 Other long term (current) drug therapy: Secondary | ICD-10-CM | POA: Diagnosis not present

## 2015-02-27 DIAGNOSIS — Z7902 Long term (current) use of antithrombotics/antiplatelets: Secondary | ICD-10-CM | POA: Diagnosis not present

## 2015-02-27 DIAGNOSIS — K047 Periapical abscess without sinus: Secondary | ICD-10-CM | POA: Diagnosis present

## 2015-02-27 DIAGNOSIS — Z955 Presence of coronary angioplasty implant and graft: Secondary | ICD-10-CM | POA: Diagnosis not present

## 2015-02-27 DIAGNOSIS — I252 Old myocardial infarction: Secondary | ICD-10-CM

## 2015-02-27 DIAGNOSIS — Z7901 Long term (current) use of anticoagulants: Secondary | ICD-10-CM | POA: Diagnosis not present

## 2015-02-27 DIAGNOSIS — R531 Weakness: Secondary | ICD-10-CM | POA: Diagnosis present

## 2015-02-27 DIAGNOSIS — I2510 Atherosclerotic heart disease of native coronary artery without angina pectoris: Secondary | ICD-10-CM | POA: Diagnosis present

## 2015-02-27 DIAGNOSIS — E876 Hypokalemia: Secondary | ICD-10-CM | POA: Diagnosis present

## 2015-02-27 DIAGNOSIS — E119 Type 2 diabetes mellitus without complications: Secondary | ICD-10-CM | POA: Diagnosis present

## 2015-02-27 DIAGNOSIS — Z86718 Personal history of other venous thrombosis and embolism: Secondary | ICD-10-CM | POA: Diagnosis not present

## 2015-02-27 DIAGNOSIS — R911 Solitary pulmonary nodule: Secondary | ICD-10-CM | POA: Diagnosis present

## 2015-02-27 DIAGNOSIS — A419 Sepsis, unspecified organism: Principal | ICD-10-CM | POA: Diagnosis present

## 2015-02-27 DIAGNOSIS — Z880 Allergy status to penicillin: Secondary | ICD-10-CM | POA: Diagnosis not present

## 2015-02-27 DIAGNOSIS — E785 Hyperlipidemia, unspecified: Secondary | ICD-10-CM | POA: Diagnosis present

## 2015-02-27 DIAGNOSIS — G40909 Epilepsy, unspecified, not intractable, without status epilepticus: Secondary | ICD-10-CM | POA: Diagnosis present

## 2015-02-27 DIAGNOSIS — K0889 Other specified disorders of teeth and supporting structures: Secondary | ICD-10-CM

## 2015-02-27 DIAGNOSIS — Z87891 Personal history of nicotine dependence: Secondary | ICD-10-CM

## 2015-02-27 DIAGNOSIS — I10 Essential (primary) hypertension: Secondary | ICD-10-CM | POA: Diagnosis present

## 2015-02-27 DIAGNOSIS — R6510 Systemic inflammatory response syndrome (SIRS) of non-infectious origin without acute organ dysfunction: Secondary | ICD-10-CM

## 2015-02-27 LAB — CBC WITH DIFFERENTIAL/PLATELET
Basophils Absolute: 0.1 10*3/uL (ref 0–0.1)
Basophils Relative: 0 %
EOS PCT: 1 %
Eosinophils Absolute: 0.2 10*3/uL (ref 0–0.7)
HEMATOCRIT: 45.3 % (ref 35.0–47.0)
Hemoglobin: 14.9 g/dL (ref 12.0–16.0)
LYMPHS ABS: 0.9 10*3/uL — AB (ref 1.0–3.6)
LYMPHS PCT: 6 %
MCH: 26.7 pg (ref 26.0–34.0)
MCHC: 32.8 g/dL (ref 32.0–36.0)
MCV: 81.6 fL (ref 80.0–100.0)
MONO ABS: 0.7 10*3/uL (ref 0.2–0.9)
Monocytes Relative: 5 %
Neutro Abs: 12.1 10*3/uL — ABNORMAL HIGH (ref 1.4–6.5)
Neutrophils Relative %: 88 %
Platelets: 159 10*3/uL (ref 150–440)
RBC: 5.56 MIL/uL — AB (ref 3.80–5.20)
RDW: 15.6 % — AB (ref 11.5–14.5)
WBC: 13.9 10*3/uL — ABNORMAL HIGH (ref 3.6–11.0)

## 2015-02-27 LAB — URINALYSIS COMPLETE WITH MICROSCOPIC (ARMC ONLY)
BILIRUBIN URINE: NEGATIVE
Bacteria, UA: NONE SEEN
Glucose, UA: >500 mg/dL — AB
Ketones, ur: NEGATIVE mg/dL
LEUKOCYTES UA: NEGATIVE
Nitrite: NEGATIVE
Protein, ur: NEGATIVE mg/dL
RBC / HPF: NONE SEEN RBC/hpf (ref 0–5)
SQUAMOUS EPITHELIAL / LPF: NONE SEEN
Specific Gravity, Urine: 1 — ABNORMAL LOW (ref 1.005–1.030)
WBC, UA: NONE SEEN WBC/hpf (ref 0–5)
pH: 6 (ref 5.0–8.0)

## 2015-02-27 LAB — COMPREHENSIVE METABOLIC PANEL
ALBUMIN: 3.9 g/dL (ref 3.5–5.0)
ALT: 23 U/L (ref 14–54)
AST: 22 U/L (ref 15–41)
Alkaline Phosphatase: 261 U/L — ABNORMAL HIGH (ref 38–126)
Anion gap: 12 (ref 5–15)
BILIRUBIN TOTAL: 0.4 mg/dL (ref 0.3–1.2)
BUN: 21 mg/dL — ABNORMAL HIGH (ref 6–20)
CO2: 27 mmol/L (ref 22–32)
Calcium: 8.9 mg/dL (ref 8.9–10.3)
Chloride: 98 mmol/L — ABNORMAL LOW (ref 101–111)
Creatinine, Ser: 0.93 mg/dL (ref 0.44–1.00)
Glucose, Bld: 172 mg/dL — ABNORMAL HIGH (ref 65–99)
Potassium: 3.4 mmol/L — ABNORMAL LOW (ref 3.5–5.1)
SODIUM: 137 mmol/L (ref 135–145)
TOTAL PROTEIN: 8.4 g/dL — AB (ref 6.5–8.1)

## 2015-02-27 LAB — LACTIC ACID, PLASMA
LACTIC ACID, VENOUS: 1.3 mmol/L (ref 0.5–2.0)
Lactic Acid, Venous: 1 mmol/L (ref 0.5–2.0)

## 2015-02-27 LAB — GLUCOSE, CAPILLARY
GLUCOSE-CAPILLARY: 214 mg/dL — AB (ref 65–99)
Glucose-Capillary: 204 mg/dL — ABNORMAL HIGH (ref 65–99)

## 2015-02-27 LAB — MAGNESIUM: MAGNESIUM: 2.2 mg/dL (ref 1.7–2.4)

## 2015-02-27 MED ORDER — DOXYCYCLINE HYCLATE 100 MG PO TABS
100.0000 mg | ORAL_TABLET | Freq: Two times a day (BID) | ORAL | Status: DC
  Administered 2015-02-27: 100 mg via ORAL
  Filled 2015-02-27: qty 1

## 2015-02-27 MED ORDER — INSULIN ASPART 100 UNIT/ML ~~LOC~~ SOLN
0.0000 [IU] | Freq: Every day | SUBCUTANEOUS | Status: DC
  Administered 2015-02-27: 2 [IU] via SUBCUTANEOUS

## 2015-02-27 MED ORDER — HYDROCHLOROTHIAZIDE 25 MG PO TABS: 25.0000 mg | ORAL_TABLET | Freq: Every day | ORAL | Status: DC

## 2015-02-27 MED ORDER — METFORMIN HCL 500 MG PO TABS: 1000.0000 mg | ORAL_TABLET | Freq: Two times a day (BID) | ORAL | Status: DC

## 2015-02-27 MED ORDER — NICOTINE 21 MG/24HR TD PT24: 21.0000 mg | MEDICATED_PATCH | Freq: Every day | TRANSDERMAL | Status: DC

## 2015-02-27 MED ORDER — POTASSIUM CHLORIDE IN NACL 20-0.9 MEQ/L-% IV SOLN
INTRAVENOUS | Status: DC
  Administered 2015-02-27 – 2015-02-28 (×2): via INTRAVENOUS
  Filled 2015-02-27 (×4): qty 1000

## 2015-02-27 MED ORDER — LISINOPRIL-HYDROCHLOROTHIAZIDE 20-25 MG PO TABS: 1.0000 | ORAL_TABLET | Freq: Every day | ORAL | Status: DC

## 2015-02-27 MED ORDER — RIVAROXABAN 10 MG PO TABS
20.0000 mg | ORAL_TABLET | Freq: Every evening | ORAL | Status: DC
  Filled 2015-02-27: qty 2

## 2015-02-27 MED ORDER — SAXAGLIPTIN-METFORMIN ER 2.5-1000 MG PO TB24: 2.0000 | ORAL_TABLET | Freq: Every day | ORAL | Status: DC

## 2015-02-27 MED ORDER — LISINOPRIL 20 MG PO TABS: 20.0000 mg | ORAL_TABLET | Freq: Every day | ORAL | Status: DC

## 2015-02-27 MED ORDER — LINAGLIPTIN 5 MG PO TABS
5.0000 mg | ORAL_TABLET | Freq: Every day | ORAL | Status: DC
  Administered 2015-02-27: 5 mg via ORAL
  Filled 2015-02-27: qty 1

## 2015-02-27 MED ORDER — PIOGLITAZONE HCL 15 MG PO TABS: 45.0000 mg | ORAL_TABLET | Freq: Every day | ORAL | Status: DC

## 2015-02-27 MED ORDER — NITROGLYCERIN 0.4 MG SL SUBL: 0.4000 mg | SUBLINGUAL_TABLET | SUBLINGUAL | Status: DC | PRN

## 2015-02-27 MED ORDER — ATORVASTATIN CALCIUM 20 MG PO TABS
40.0000 mg | ORAL_TABLET | Freq: Every day | ORAL | Status: DC
  Filled 2015-02-27: qty 2

## 2015-02-27 MED ORDER — INSULIN ASPART 100 UNIT/ML ~~LOC~~ SOLN
0.0000 [IU] | Freq: Three times a day (TID) | SUBCUTANEOUS | Status: DC
  Administered 2015-02-27: 3 [IU] via SUBCUTANEOUS
  Filled 2015-02-27: qty 3
  Filled 2015-02-27: qty 2

## 2015-02-27 MED ORDER — SODIUM CHLORIDE 0.9 % IV SOLN
1000.0000 mL | Freq: Once | INTRAVENOUS | Status: AC
  Administered 2015-02-27: 1000 mL via INTRAVENOUS

## 2015-02-27 MED ORDER — CLOPIDOGREL BISULFATE 75 MG PO TABS: 75.0000 mg | ORAL_TABLET | Freq: Every day | ORAL | Status: DC

## 2015-02-27 MED ORDER — RIVAROXABAN 20 MG PO TABS
20.0000 mg | ORAL_TABLET | Freq: Every day | ORAL | Status: DC
  Administered 2015-02-27: 20 mg via ORAL
  Filled 2015-02-27: qty 1

## 2015-02-27 MED ORDER — PHENYTOIN SODIUM EXTENDED 100 MG PO CAPS
300.0000 mg | ORAL_CAPSULE | Freq: Every day | ORAL | Status: DC
  Administered 2015-02-27: 300 mg via ORAL
  Filled 2015-02-27: qty 3

## 2015-02-27 MED ORDER — DIAZEPAM 5 MG PO TABS: 5.0000 mg | ORAL_TABLET | Freq: Two times a day (BID) | ORAL | Status: DC | PRN

## 2015-02-27 MED ORDER — EMPAGLIFLOZIN 25 MG PO TABS
25.0000 mg | ORAL_TABLET | Freq: Every day | ORAL | Status: DC
Start: 2015-02-27 — End: 2015-02-28
  Filled 2015-02-27: qty 25

## 2015-02-27 MED ORDER — NICOTINE 10 MG IN INHA: 1.0000 | RESPIRATORY_TRACT | Status: DC | PRN

## 2015-02-27 MED ORDER — SODIUM CHLORIDE 0.9 % IV SOLN: INTRAVENOUS | Status: DC

## 2015-02-27 MED ORDER — METOPROLOL TARTRATE 25 MG PO TABS
25.0000 mg | ORAL_TABLET | Freq: Two times a day (BID) | ORAL | Status: DC
  Administered 2015-02-27: 25 mg via ORAL
  Filled 2015-02-27: qty 1

## 2015-02-27 MED ORDER — HYDROCODONE-ACETAMINOPHEN 5-325 MG PO TABS
1.0000 | ORAL_TABLET | ORAL | Status: DC | PRN
  Administered 2015-02-27 – 2015-02-28 (×2): 1 via ORAL
  Filled 2015-02-27 (×2): qty 1

## 2015-02-27 MED ORDER — LINACLOTIDE 290 MCG PO CAPS
290.0000 ug | ORAL_CAPSULE | Freq: Every day | ORAL | Status: DC
  Filled 2015-02-27 (×2): qty 1

## 2015-02-27 MED ORDER — PAROXETINE HCL 20 MG PO TABS: 20.0000 mg | ORAL_TABLET | Freq: Every day | ORAL | Status: DC

## 2015-02-27 NOTE — H&P (Addendum)
[facility] [place] NAME: Dawn Allen    MR#:  [id number]  DATE OF BIRTH:  12/19/1950  DATE OF ADMISSION:  02/27/2015  PRIMARY CARE PHYSICIAN: Lorelee Market, MD   REQUESTING/REFERRING PHYSICIAN:   CHIEF COMPLAINT:   Chief Complaint  Patient presents with  . Fever    HISTORY OF PRESENT ILLNESS: Dawn Allen  is a 64 y.o. female with a known history of hypertension, DVT, on anticoagulation with Eliquis who presents to the hospital with complaints of fevers and chills when the patient, she was doing well up until today in the morning when she is having shaking chills as well as feeling cold. She felt quite good yesterday though, today she admits of feeling fatigued and weak.  Patient admits of losing weight, approximately a 4 to 5 pounds each time she goes to the physician's office. She's been losing weight for proximal to 6 months now SL dry cough and some shortness of breath. Admits of arrhythmias as well as intermittent nausea and vomiting and abdominal pain. She was seen in the emergency room on 02/06/2015 at which time she had CT scan of her abdomen which was unremarkable. She was discharged to home on medications for constipation. Her abdominal pain has improved.   PAST MEDICAL HISTORY:   Past Medical History  Diagnosis Date  . Hypertension   . DVT (deep venous thrombosis)     LEFT LEG   . MI (myocardial infarction)   . Diabetes mellitus without complication   . Seizure disorder   . Coronary artery disease     a. NSTEMI cath 08/02/14: LM nl, pLAD 50%, mLCx 30%, mRCA 95% s/p PCI/DES, 2nd lesion 40%, EF 55%  . HLD (hyperlipidemia)     PAST SURGICAL HISTORY:  Past Surgical History  Procedure Laterality Date  . Abdominal hysterectomy      PARTIAL   . Leg surgery Right     BLOOD CLOT  . Cardiac catheterization  08/02/2014  . Coronary angioplasty  08/02/2014    drug eluting stent placement    SOCIAL HISTORY:   History  Substance Use Topics  . Smoking status: Former Smoker -- 1.00 packs/day for 14 years    Quit date: 08/04/2014  . Smokeless tobacco: Never Used  . Alcohol Use: No    FAMILY HISTORY:  Family History  Problem Relation Age of Onset  . Family history unknown: Yes    DRUG ALLERGIES:  Allergies  Allergen Reactions  . Penicillins     Review of Systems  Constitutional: Positive for fever, chills, weight loss and malaise/fatigue.  HENT: Negative for congestion.   Eyes: Negative for blurred vision and double vision.  Respiratory: Positive for cough and shortness of breath. Negative for sputum production and wheezing.   Cardiovascular: Negative for chest pain, palpitations, orthopnea, leg swelling and PND.  Gastrointestinal: Positive for nausea, vomiting, abdominal pain and constipation. Negative for diarrhea, blood in stool and melena.  Genitourinary: Negative for dysuria, urgency, frequency and hematuria.  Musculoskeletal: Positive for joint pain. Negative for falls.  Skin: Negative for rash.  Neurological: Negative for dizziness and weakness.  Psychiatric/Behavioral: Negative for depression and memory loss. The patient is not nervous/anxious.     MEDICATIONS AT HOME:  Prior to Admission medications   Medication Sig Start Date End Date Taking? Authorizing Provider  atorvastatin (LIPITOR) 40 MG tablet Take 40 mg by mouth daily.   Yes Historical Provider, MD  clopidogrel (PLAVIX) 75 MG tablet Take  75 mg by mouth daily.   Yes Historical Provider, MD  empagliflozin (JARDIANCE) 25 MG TABS tablet Take 25 mg by mouth daily.   Yes Historical Provider, MD  LINZESS 290 MCG CAPS capsule Take 1 capsule by mouth daily. 02/05/15  Yes Historical Provider, MD  lisinopril-hydrochlorothiazide (PRINZIDE,ZESTORETIC) 20-25 MG per tablet Take 1 tablet by mouth daily.  07/26/14  Yes Historical Provider, MD  metFORMIN (GLUCOPHAGE) 1000 MG tablet Take 1,000 mg by mouth 2 (two) times daily with a  meal.   Yes Historical Provider, MD  metoprolol tartrate (LOPRESSOR) 25 MG tablet Take 25 mg by mouth 2 (two) times daily.   Yes Historical Provider, MD  PARoxetine (PAXIL) 20 MG tablet Take 1 tablet by mouth daily. 01/16/15  Yes Historical Provider, MD  phenytoin (DILANTIN) 100 MG ER capsule Take 300 mg by mouth daily.    Yes Historical Provider, MD  pioglitazone (ACTOS) 45 MG tablet Take 1 tablet by mouth daily. 01/28/15  Yes Historical Provider, MD  Saxagliptin-Metformin (KOMBIGLYZE XR) 2.12-998 MG TB24 Take 2 tablets by mouth daily.    Yes Historical Provider, MD  XARELTO 10 MG TABS tablet Take 1 tablet by mouth every evening. 02/13/15  Yes Historical Provider, MD  diazepam (VALIUM) 5 MG tablet Take 1 tablet by mouth 2 (two) times daily as needed. 01/03/15   Historical Provider, MD  nitroGLYCERIN (NITROSTAT) 0.4 MG SL tablet Place 0.4 mg under the tongue every 5 (five) minutes as needed for chest pain.    Historical Provider, MD      PHYSICAL EXAMINATION:   VITAL SIGNS: Blood pressure 115/59, pulse 114, temperature 103 F (39.4 C), temperature source Oral, resp. rate 22, height '5\' 5"'$  (1.651 m), weight 91.173 kg (201 lb), SpO2 97 %.  GENERAL:  64 y.o.-year-old patient lying in the bed with no acute distress.  EYES: Pupils equal, round, reactive to light and accommodation. No scleral icterus. Extraocular muscles intact.  HEENT: Head atraumatic, normocephalic. Oropharynx and nasopharynx clear.  NECK:  Supple, no jugular venous distention. No thyroid enlargement, no tenderness.  LUNGS: Slightly diminished breath sounds bilaterally, anteriorly, no wheezing, rales,rhonchi or crepitation. No use of accessory muscles of respiration.  CARDIOVASCULAR: S1, S2 normal. No murmurs, rubs, or gallops.  ABDOMEN: Soft, nontender, nondistended. Bowel sounds present. No organomegaly or mass.  EXTREMITIES: No pedal edema, cyanosis, or clubbing.  NEUROLOGIC: Cranial nerves II through XII are intact. Muscle  strength 5/5 in all extremities. Sensation intact. Gait not checked.  PSYCHIATRIC: The patient is alert and oriented x 3.  SKIN: No obvious rash, lesion, or ulcer. Peripheral pulses were diminished in lower extremities  LABORATORY PANEL:   CBC  Recent Labs Lab 02/27/15 1030  WBC 13.9*  HGB 14.9  HCT 45.3  PLT 159  MCV 81.6  MCH 26.7  MCHC 32.8  RDW 15.6*  LYMPHSABS 0.9*  MONOABS 0.7  EOSABS 0.2  BASOSABS 0.1   ------------------------------------------------------------------------------------------------------------------  Chemistries   Recent Labs Lab 02/27/15 1030  NA 137  K 3.4*  CL 98*  CO2 27  GLUCOSE 172*  BUN 21*  CREATININE 0.93  CALCIUM 8.9  AST 22  ALT 23  ALKPHOS 261*  BILITOT 0.4   ------------------------------------------------------------------------------------------------------------------  Cardiac Enzymes No results for input(s): TROPONINI in the last 168 hours. ------------------------------------------------------------------------------------------------------------------  RADIOLOGY: Dg Chest 2 View  02/27/2015   CLINICAL DATA:  Fever and cough  EXAM: CHEST  2 VIEW  COMPARISON:  CT chest 08/01/2014, chest x-ray 08/01/2014  FINDINGS: Cardiac enlargement without heart failure.  Negative for pneumonia or effusion. Small left upper lobe nodular density similar to the prior CT. Followup CT recommended per prior recommendation. No other lung nodule  IMPRESSION: Cardiac enlargement without heart failure  Left upper lobe nodule. Chest CT follow-up per prior CT recommendation.   Electronically Signed   By: Franchot Gallo M.D.   On: 02/27/2015 11:57   Ct Chest Wo Contrast  02/27/2015   CLINICAL DATA:  Possible pneumonia. Fever and chills. Abnormal chest x-ray.  EXAM: CT CHEST WITHOUT CONTRAST  TECHNIQUE: Multidetector CT imaging of the chest was performed following the standard protocol without IV contrast.  COMPARISON:  CT 08/01/2014.  Chest x-ray  02/27/2015  FINDINGS: Interval enlargement of left upper lobe nodule which has become more solid and has spiculated margins. Nodule now measures 8 x 16 mm and is concerning for carcinoma. No other lung nodule. No mediastinal adenopathy.  Negative for infiltrate or effusion.  Coronary artery calcification. Aortic arch calcification. Heart size normal.  Adrenal enlargement bilaterally similar to the prior study and possibly due to adrenal hypertrophy versus adenoma. Metastatic disease considered less likely. These are incompletely evaluated on this study.  No focal bony abnormality.  IMPRESSION: Enlarging left upper lobe nodule now measuring 8 x 16 mm and concerning for carcinoma. PET-CT recommended for further evaluation  Bilateral adrenal enlargement likely due to adrenal hypertrophy or adenoma. Metastatic disease considered less likely.   Electronically Signed   By: Franchot Gallo M.D.   On: 02/27/2015 14:07    EKG: Orders placed or performed in visit on 10/10/14  . EKG    IMPRESSION AND PLAN:  Active Problems:   SIRS (systemic inflammatory response syndrome)  1. SIRS , admit patient to medical floor, get blood cultures taken, start patient on antibiotic therapy with doxycycline. Get ehrlichia testing , Lyme disease antibodies,  also Rocky mountain spotted fever serology. Upon further questioning, however, it appeared the patient was also complaining of left lower jaw area Tooth pain, we'll get CT scan of maxillofacial area to rule out tooth abscess.  2. Hypokalemia. Supplement IV, check magnesium level 3. Leukocytosis. Follow with antibiotic therapy 4. Generalized weakness. Get physical therapist involved for further recommendations 5. Lung nodule. Patient was advised to follow-up with cancer Center as outpatient 6. Tobacco abuse. Discussed this patient for approximately 5 minutes. Nicotine replacement therapy will be initiated ,  patient was agreeable  All the records are reviewed and case  discussed with ED provider. Management plans discussed with the patient, family and they are in agreement.  CODE STATUS: Full code  TOTAL TIME TAKING CARE OF THIS PATIENT: 50 minutes.    Theodoro Grist M.D on 02/27/2015 at 4:09 PM  Between 7am to 6pm - Pager - [phone] After 6pm go to [email or web address] - password [email or web address] [facility]  Office  [phone]  CC: Primary care physician; Lorelee Market, MD

## 2015-02-27 NOTE — ED Notes (Signed)
Pt presents to ED via EMS w/ c/o fever/chills. Per EMS, pt woke up this morning feeling like she "couldn't get warm".  Per pt she has not taken AM medications.  Pt sts she feels nauseous.

## 2015-02-27 NOTE — ED Provider Notes (Signed)
[facility] Emergency Department Provider Note  ____________________________________________  Time seen: On arrival  I have reviewed the triage vital signs and the nursing notes.   HISTORY  Chief Complaint Fever    HPI Dawn Allen is a 64 y.o. female who complains of chills that started this morning. She reports she felt well yesterday but this morning she woke up and has had difficulty getting warm. She has some mild nausea but denies abdominal pain, denies dysuria. Does report a mild cough. No shortness of breath and no diarrhea. She denies sick contacts but reports her boyfriend has bone cancer     Past Medical History  Diagnosis Date  . Hypertension   . DVT (deep venous thrombosis)     LEFT LEG   . MI (myocardial infarction)   . Diabetes mellitus without complication   . Seizure disorder   . Coronary artery disease     a. NSTEMI cath 08/02/14: LM nl, pLAD 50%, mLCx 30%, mRCA 95% s/p PCI/DES, 2nd lesion 40%, EF 55%  . HLD (hyperlipidemia)     Patient Active Problem List   Diagnosis Date Noted  . Coronary artery disease   . Hypertension   . DVT (deep venous thrombosis)   . HLD (hyperlipidemia)   . Achilles bursitis or tendinitis 05/31/2013  . 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 Other hammer toe (acquired) 05/31/2013  . 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 Abdominal hysterectomy      PARTIAL   . Leg surgery Right     BLOOD CLOT  . Cardiac catheterization  08/02/2014  . Coronary angioplasty  08/02/2014    drug eluting stent placement    Current Outpatient Rx  Name  Route  Sig  Dispense  Refill  . atorvastatin (LIPITOR) 40 MG tablet   Oral   Take 40 mg by mouth daily.         . clopidogrel (PLAVIX) 75 MG tablet   Oral   Take 75 mg by mouth daily.         . empagliflozin (JARDIANCE) 25 MG TABS tablet   Oral   Take 25 mg by mouth  daily.         Marland Kitchen LINZESS 290 MCG CAPS capsule   Oral   Take 1 capsule by mouth daily.           Dispense as written.   Marland Kitchen lisinopril-hydrochlorothiazide (PRINZIDE,ZESTORETIC) 20-25 MG per tablet   Oral   Take 1 tablet by mouth daily.          . metFORMIN (GLUCOPHAGE) 1000 MG tablet   Oral   Take 1,000 mg by mouth 2 (two) times daily with a meal.         . metoprolol tartrate (LOPRESSOR) 25 MG tablet   Oral   Take 25 mg by mouth 2 (two) times daily.         Marland Kitchen PARoxetine (PAXIL) 20 MG tablet   Oral   Take 1 tablet by mouth daily.         . phenytoin (DILANTIN) 100 MG ER capsule   Oral   Take 300 mg by mouth daily.          . pioglitazone (ACTOS) 45 MG tablet   Oral   Take 1 tablet by mouth daily.         . Saxagliptin-Metformin (KOMBIGLYZE XR) 2.12-998 MG TB24   Oral   Take 2  tablets by mouth daily.          Alveda Reasons 10 MG TABS tablet   Oral   Take 1 tablet by mouth every evening.           Dispense as written.   . diazepam (VALIUM) 5 MG tablet   Oral   Take 1 tablet by mouth 2 (two) times daily as needed.         . nitroGLYCERIN (NITROSTAT) 0.4 MG SL tablet   Sublingual   Place 0.4 mg under the tongue every 5 (five) minutes as needed for chest pain.           Allergies Penicillins  Family History  Problem Relation Age of Onset  . Family history unknown: Yes    Social History History  Substance Use Topics  . Smoking status: Former Smoker -- 1.00 packs/day for 14 years    Quit date: 08/04/2014  . Smokeless tobacco: Never Used  . Alcohol Use: No    Review of Systems  Constitutional: Positive for chills Eyes: Negative for visual changes. ENT: Negative for sore throat Cardiovascular: Negative for chest pain. Respiratory: Negative for shortness of breath. Gastrointestinal: Negative for abdominal pain, vomiting and diarrhea. Positive for nausea Genitourinary: Negative for dysuria. Musculoskeletal: Negative for back  pain. Skin: Negative for rash. Neurological: Negative for headaches or focal weakness Psychiatric: No anxiety  10-point ROS otherwise negative.  ____________________________________________   PHYSICAL EXAM:  VITAL SIGNS: ED Triage Vitals  Enc Vitals Group     BP 02/27/15 1022 169/79 mmHg     Pulse Rate 02/27/15 1022 112     Resp 02/27/15 1022 19     Temp 02/27/15 1022 103 F (39.4 C)     Temp Source 02/27/15 1022 Oral     SpO2 02/27/15 1022 98 %     Weight 02/27/15 1022 201 lb (91.173 kg)     Height 02/27/15 1022 '5\' 5"'$  (1.651 m)     Head Cir --      Peak Flow --      Pain Score 02/27/15 1023 7     Pain Loc --      Pain Edu? --      Excl. in [place]? --      Constitutional: Alert and oriented. Well appearing and in no distress. Eyes: Conjunctivae are normal.  ENT   Head: Normocephalic and atraumatic.   Mouth/Throat: Mucous membranes are moist. Cardiovascular: Tachycardia, regular rhythm. Normal and symmetric distal pulses are present in all extremities. No murmurs, rubs, or gallops. Respiratory: Normal respiratory effort without tachypnea nor retractions. Breath sounds are clear and equal bilaterally.  Gastrointestinal: Soft and non-tender in all quadrants. No distention. There is no CVA tenderness. Genitourinary: deferred Musculoskeletal: Nontender with normal range of motion in all extremities. No lower extremity tenderness nor edema. Neurologic:  Normal speech and language. No gross focal neurologic deficits are appreciated. Skin:  Skin is warm, dry and intact. No rash noted. Psychiatric: Mood and affect are normal. Patient exhibits appropriate insight and judgment.  ____________________________________________    LABS (pertinent positives/negatives)  Labs Reviewed  CBC WITH DIFFERENTIAL/PLATELET - Abnormal; Notable for the following:    WBC 13.9 (*)    RBC 5.56 (*)    RDW 15.6 (*)    Neutro Abs 12.1 (*)    Lymphs Abs 0.9 (*)    All other components  within normal limits  COMPREHENSIVE METABOLIC PANEL - Abnormal; Notable for the following:    Potassium 3.4 (*)  Chloride 98 (*)    Glucose, Bld 172 (*)    BUN 21 (*)    Total Protein 8.4 (*)    Alkaline Phosphatase 261 (*)    All other components within normal limits  URINALYSIS COMPLETEWITH MICROSCOPIC ([facility] ONLY) - Abnormal; Notable for the following:    Color, Urine COLORLESS (*)    APPearance CLEAR (*)    Glucose, UA >500 (*)    Specific Gravity, Urine 1.000 (*)    Hgb urine dipstick 1+ (*)    All other components within normal limits  CULTURE, BLOOD (ROUTINE X 2)  CULTURE, BLOOD (ROUTINE X 2)  URINE CULTURE  LACTIC ACID, PLASMA  LACTIC ACID, PLASMA    ____________________________________________   EKG  None  ____________________________________________    RADIOLOGY I have personally reviewed any xrays that were ordered on this patient: Chest x-ray unremarkable  CT scan shows nodule has enlarged. Notified patient of this and the need for close close follow-up  ____________________________________________   PROCEDURES  Procedure(s) performed: none  Critical Care performed: none  ____________________________________________   INITIAL IMPRESSION / ASSESSMENT AND PLAN / ED COURSE  Pertinent labs & imaging results that were available during my care of the patient were reviewed by me and considered in my medical decision making (see chart for details).  Unclear cause of patient's chills/fever. Chest x-ray unremarkable, urine normal. Blood work shows elevated white blood cell count and given fever and tachycardia is consistent with SIRS criteria. I will ask hospitalist to evaluate the patient for admission. ____________________________________________   FINAL CLINICAL IMPRESSION(S) / ED DIAGNOSES  Final diagnoses:  SIRS (systemic inflammatory response syndrome)     Lavonia Drafts, MD 02/27/15 1517

## 2015-02-27 NOTE — ED Notes (Addendum)
Patient resting in stretcher. Respirations even and unlabored. No obvious distress. No needs/concerns verbalized at this time. Call bell within reach. Encouraged to call with needs. Will continue to monitor.

## 2015-02-27 NOTE — Progress Notes (Signed)
ANTICOAGULATION CONSULT NOTE - Initial Consult  Pharmacy Consult for Xarelto Indication: VTE prophylaxis  Allergies  Allergen Reactions  . Penicillins     Patient Measurements: Height: '5\' 5"'$  (165.1 cm) Weight: 201 lb (91.173 kg) IBW/kg (Calculated) : 57 Heparin Dosing Weight:   Vital Signs: Temp: 99.8 F (37.7 C) (07/07 1655) Temp Source: Oral (07/07 1655) BP: 118/51 mmHg (07/07 1655) Pulse Rate: 89 (07/07 1655)  Labs:  Recent Labs  02/27/15 1030  HGB 14.9  HCT 45.3  PLT 159  CREATININE 0.93    Estimated Creatinine Clearance: 69.1 mL/min (by C-G formula based on Cr of 0.93).   Medical History: Past Medical History  Diagnosis Date  . Hypertension   . DVT (deep venous thrombosis)     LEFT LEG   . MI (myocardial infarction)   . Diabetes mellitus without complication   . Seizure disorder   . Coronary artery disease     a. NSTEMI cath 08/02/14: LM nl, pLAD 50%, mLCx 30%, mRCA 95% s/p PCI/DES, 2nd lesion 40%, EF 55%  . HLD (hyperlipidemia)     Medications:  Prescriptions prior to admission  Medication Sig Dispense Refill Last Dose  . atorvastatin (LIPITOR) 40 MG tablet Take 40 mg by mouth daily.   02/27/2015 at Unknown time  . clopidogrel (PLAVIX) 75 MG tablet Take 75 mg by mouth daily.   02/26/2015 at Unknown time  . empagliflozin (JARDIANCE) 25 MG TABS tablet Take 25 mg by mouth daily.   02/27/2015 at Unknown time  . LINZESS 290 MCG CAPS capsule Take 1 capsule by mouth daily.   02/27/2015 at Unknown time  . lisinopril-hydrochlorothiazide (PRINZIDE,ZESTORETIC) 20-25 MG per tablet Take 1 tablet by mouth daily.    02/27/2015 at Unknown time  . metFORMIN (GLUCOPHAGE) 1000 MG tablet Take 1,000 mg by mouth 2 (two) times daily with a meal.   02/26/2015 at Unknown time  . metoprolol tartrate (LOPRESSOR) 25 MG tablet Take 25 mg by mouth 2 (two) times daily.   02/27/2015 at Unknown time  . PARoxetine (PAXIL) 20 MG tablet Take 1 tablet by mouth daily.   02/27/2015 at Unknown time  .  phenytoin (DILANTIN) 100 MG ER capsule Take 300 mg by mouth daily.    02/26/2015 at Unknown time  . pioglitazone (ACTOS) 45 MG tablet Take 1 tablet by mouth daily.   02/27/2015 at Unknown time  . Saxagliptin-Metformin (KOMBIGLYZE XR) 2.12-998 MG TB24 Take 2 tablets by mouth daily.    02/26/2015 at Unknown time  . XARELTO 10 MG TABS tablet Take 1 tablet by mouth every evening.   02/26/2015 at Unknown time  . diazepam (VALIUM) 5 MG tablet Take 1 tablet by mouth 2 (two) times daily as needed.   prn at prn  . nitroGLYCERIN (NITROSTAT) 0.4 MG SL tablet Place 0.4 mg under the tongue every 5 (five) minutes as needed for chest pain.   prn at prn    Assessment: Pt was originally ordered Xarelto 10 mg PO daily for DVT prophylaxis based on home med list.  Spoke with Dr V, agreed to increase dose to Xarelto 20 mg PO daily based on indication.   Goal of Therapy:  DVT prophylaxis   Plan:  Xarelto 10 mg PO daily originally ordered.  Will adjust dose to Xarelto 20 mg PO daily.   Teran Daughenbaugh D 02/27/2015,7:02 PM

## 2015-02-28 LAB — URINE CULTURE: Special Requests: NORMAL

## 2015-02-28 NOTE — Progress Notes (Signed)
RN called to room. Pt requesting to leave AMA. She was upset about getting her medications. She wants to take them the way she does at home. I explained to her we could administer her medications to her like she does at home. She still wanted to leave. She signed AMA paper. I tried to explained the benefits of her staying. She didn't want to hear what I had to say.

## 2015-02-28 NOTE — Progress Notes (Signed)
Dr Verdell Carmine notified that pt is requesting to leave AMA. PT UPSET WANTING TO ADM MEDS INDEPENDENTLY.refuses to stay at hospital . md acknowledged AMA

## 2015-02-28 NOTE — Progress Notes (Signed)
PT [facility] MED OF DIABETES PRIOR TO DISCHARGE

## 2015-03-02 ENCOUNTER — Inpatient Hospital Stay
Admission: AD | Admit: 2015-03-02 | Discharge: 2015-03-05 | DRG: 872 | Disposition: A | Discharge status: Home / Self Care | Payer: Medicare Other | Source: Ambulatory Visit | Attending: Internal Medicine | Admitting: Internal Medicine

## 2015-03-02 ENCOUNTER — Inpatient Hospital Stay: Payer: Medicare Other

## 2015-03-02 ENCOUNTER — Encounter: Payer: Self-pay | Admitting: *Deleted

## 2015-03-02 DIAGNOSIS — Z86718 Personal history of other venous thrombosis and embolism: Secondary | ICD-10-CM

## 2015-03-02 DIAGNOSIS — I2510 Atherosclerotic heart disease of native coronary artery without angina pectoris: Secondary | ICD-10-CM | POA: Diagnosis present

## 2015-03-02 DIAGNOSIS — A408 Other streptococcal sepsis: Principal | ICD-10-CM | POA: Diagnosis present

## 2015-03-02 DIAGNOSIS — K029 Dental caries, unspecified: Secondary | ICD-10-CM | POA: Diagnosis present

## 2015-03-02 DIAGNOSIS — I10 Essential (primary) hypertension: Secondary | ICD-10-CM | POA: Diagnosis present

## 2015-03-02 DIAGNOSIS — K047 Periapical abscess without sinus: Secondary | ICD-10-CM | POA: Diagnosis present

## 2015-03-02 DIAGNOSIS — E119 Type 2 diabetes mellitus without complications: Secondary | ICD-10-CM | POA: Diagnosis present

## 2015-03-02 DIAGNOSIS — G40909 Epilepsy, unspecified, not intractable, without status epilepticus: Secondary | ICD-10-CM | POA: Diagnosis present

## 2015-03-02 DIAGNOSIS — R531 Weakness: Secondary | ICD-10-CM

## 2015-03-02 DIAGNOSIS — A419 Sepsis, unspecified organism: Secondary | ICD-10-CM

## 2015-03-02 DIAGNOSIS — B954 Other streptococcus as the cause of diseases classified elsewhere: Secondary | ICD-10-CM | POA: Diagnosis present

## 2015-03-02 DIAGNOSIS — R7881 Bacteremia: Secondary | ICD-10-CM | POA: Diagnosis not present

## 2015-03-02 DIAGNOSIS — F17210 Nicotine dependence, cigarettes, uncomplicated: Secondary | ICD-10-CM | POA: Diagnosis present

## 2015-03-02 DIAGNOSIS — I252 Old myocardial infarction: Secondary | ICD-10-CM

## 2015-03-02 DIAGNOSIS — Z79899 Other long term (current) drug therapy: Secondary | ICD-10-CM | POA: Diagnosis not present

## 2015-03-02 DIAGNOSIS — Z880 Allergy status to penicillin: Secondary | ICD-10-CM | POA: Diagnosis not present

## 2015-03-02 DIAGNOSIS — R911 Solitary pulmonary nodule: Secondary | ICD-10-CM | POA: Diagnosis present

## 2015-03-02 DIAGNOSIS — E785 Hyperlipidemia, unspecified: Secondary | ICD-10-CM | POA: Diagnosis present

## 2015-03-02 DIAGNOSIS — R918 Other nonspecific abnormal finding of lung field: Secondary | ICD-10-CM

## 2015-03-02 LAB — URINALYSIS COMPLETE WITH MICROSCOPIC (ARMC ONLY)
Bacteria, UA: NONE SEEN
Bilirubin Urine: NEGATIVE
Glucose, UA: >500 mg/dL — AB
KETONES UR: NEGATIVE mg/dL
Leukocytes, UA: NEGATIVE
Nitrite: NEGATIVE
PH: 6 (ref 5.0–8.0)
PROTEIN: NEGATIVE mg/dL
SPECIFIC GRAVITY, URINE: 1.009 (ref 1.005–1.030)
Squamous Epithelial / LPF: NONE SEEN
WBC UA: NONE SEEN WBC/hpf (ref 0–5)

## 2015-03-02 LAB — COMPREHENSIVE METABOLIC PANEL
ALK PHOS: 174 U/L — AB (ref 38–126)
ALT: 23 U/L (ref 14–54)
ANION GAP: 10 (ref 5–15)
AST: 21 U/L (ref 15–41)
Albumin: 3.4 g/dL — ABNORMAL LOW (ref 3.5–5.0)
BILIRUBIN TOTAL: 0.3 mg/dL (ref 0.3–1.2)
BUN: 20 mg/dL (ref 6–20)
CHLORIDE: 101 mmol/L (ref 101–111)
CO2: 27 mmol/L (ref 22–32)
Calcium: 8.8 mg/dL — ABNORMAL LOW (ref 8.9–10.3)
Creatinine, Ser: 1.07 mg/dL — ABNORMAL HIGH (ref 0.44–1.00)
GFR calc Af Amer: >60 mL/min (ref >60)
GFR calc non Af Amer: 54 mL/min — ABNORMAL LOW (ref >60)
GLUCOSE: 144 mg/dL — AB (ref 65–99)
POTASSIUM: 3.5 mmol/L (ref 3.5–5.1)
SODIUM: 138 mmol/L (ref 135–145)
TOTAL PROTEIN: 7 g/dL (ref 6.5–8.1)

## 2015-03-02 LAB — CBC
HCT: 40 % (ref 35.0–47.0)
HEMOGLOBIN: 13.2 g/dL (ref 12.0–16.0)
MCH: 26.9 pg (ref 26.0–34.0)
MCHC: 33.1 g/dL (ref 32.0–36.0)
MCV: 81.3 fL (ref 80.0–100.0)
Platelets: 143 10*3/uL — ABNORMAL LOW (ref 150–440)
RBC: 4.93 MIL/uL (ref 3.80–5.20)
RDW: 16.4 % — AB (ref 11.5–14.5)
WBC: 7 10*3/uL (ref 3.6–11.0)

## 2015-03-02 LAB — GLUCOSE, CAPILLARY: GLUCOSE-CAPILLARY: 147 mg/dL — AB (ref 65–99)

## 2015-03-02 MED ORDER — HYDROCHLOROTHIAZIDE 25 MG PO TABS
25.0000 mg | ORAL_TABLET | Freq: Every day | ORAL | Status: DC
  Administered 2015-03-03 – 2015-03-04 (×2): 25 mg via ORAL
  Filled 2015-03-02 (×2): qty 1

## 2015-03-02 MED ORDER — ATORVASTATIN CALCIUM 20 MG PO TABS
40.0000 mg | ORAL_TABLET | Freq: Every day | ORAL | Status: DC
  Administered 2015-03-03 – 2015-03-04 (×2): 40 mg via ORAL
  Filled 2015-03-02 (×2): qty 2

## 2015-03-02 MED ORDER — DOCUSATE SODIUM 100 MG PO CAPS
100.0000 mg | ORAL_CAPSULE | Freq: Two times a day (BID) | ORAL | Status: DC
  Administered 2015-03-02 – 2015-03-04 (×4): 100 mg via ORAL
  Filled 2015-03-02 (×5): qty 1

## 2015-03-02 MED ORDER — DOXYCYCLINE HYCLATE 100 MG PO TABS
100.0000 mg | ORAL_TABLET | Freq: Two times a day (BID) | ORAL | Status: AC
  Administered 2015-03-02 – 2015-03-03 (×4): 100 mg via ORAL
  Filled 2015-03-02 (×4): qty 1

## 2015-03-02 MED ORDER — DIAZEPAM 5 MG PO TABS
5.0000 mg | ORAL_TABLET | Freq: Three times a day (TID) | ORAL | Status: DC | PRN
  Administered 2015-03-03: 5 mg via ORAL
  Filled 2015-03-02: qty 1

## 2015-03-02 MED ORDER — CEFTRIAXONE SODIUM IN DEXTROSE 20 MG/ML IV SOLN
1.0000 g | Freq: Two times a day (BID) | INTRAVENOUS | Status: DC
  Administered 2015-03-02 – 2015-03-03 (×4): 1 g via INTRAVENOUS
  Filled 2015-03-02 (×7): qty 50

## 2015-03-02 MED ORDER — LINACLOTIDE 290 MCG PO CAPS
290.0000 ug | ORAL_CAPSULE | Freq: Every day | ORAL | Status: DC
Start: 2015-03-02 — End: 2015-03-05
  Filled 2015-03-02 (×4): qty 1

## 2015-03-02 MED ORDER — METOPROLOL TARTRATE 25 MG PO TABS
25.0000 mg | ORAL_TABLET | Freq: Two times a day (BID) | ORAL | Status: DC
  Administered 2015-03-02 – 2015-03-04 (×4): 25 mg via ORAL
  Filled 2015-03-02 (×4): qty 1

## 2015-03-02 MED ORDER — CLOPIDOGREL BISULFATE 75 MG PO TABS
75.0000 mg | ORAL_TABLET | Freq: Every day | ORAL | Status: DC
  Filled 2015-03-02 (×2): qty 1

## 2015-03-02 MED ORDER — LISINOPRIL 20 MG PO TABS
20.0000 mg | ORAL_TABLET | Freq: Every day | ORAL | Status: DC
  Administered 2015-03-03 – 2015-03-04 (×2): 20 mg via ORAL
  Filled 2015-03-02 (×2): qty 1

## 2015-03-02 MED ORDER — ACETAMINOPHEN 650 MG RE SUPP: 650.0000 mg | Freq: Four times a day (QID) | RECTAL | Status: DC | PRN

## 2015-03-02 MED ORDER — PAROXETINE HCL 20 MG PO TABS
20.0000 mg | ORAL_TABLET | Freq: Every day | ORAL | Status: DC
  Administered 2015-03-03: 20 mg via ORAL
  Filled 2015-03-02: qty 1

## 2015-03-02 MED ORDER — POTASSIUM CHLORIDE IN NACL 20-0.9 MEQ/L-% IV SOLN
INTRAVENOUS | Status: DC
  Administered 2015-03-02: 19:00:00 via INTRAVENOUS
  Filled 2015-03-02 (×8): qty 1000

## 2015-03-02 MED ORDER — INSULIN ASPART 100 UNIT/ML ~~LOC~~ SOLN: 0.0000 [IU] | Freq: Every day | SUBCUTANEOUS | Status: DC

## 2015-03-02 MED ORDER — NITROGLYCERIN 0.4 MG SL SUBL: 0.4000 mg | SUBLINGUAL_TABLET | SUBLINGUAL | Status: DC | PRN

## 2015-03-02 MED ORDER — LISINOPRIL-HYDROCHLOROTHIAZIDE 20-25 MG PO TABS
1.0000 | ORAL_TABLET | Freq: Every day | ORAL | Status: DC
Start: 2015-03-02 — End: 2015-03-02

## 2015-03-02 MED ORDER — HEPARIN SODIUM (PORCINE) 5000 UNIT/ML IJ SOLN
5000.0000 [IU] | Freq: Three times a day (TID) | INTRAMUSCULAR | Status: DC
  Administered 2015-03-02 – 2015-03-03 (×2): 5000 [IU] via SUBCUTANEOUS
  Filled 2015-03-02 (×2): qty 1

## 2015-03-02 MED ORDER — ONDANSETRON HCL 4 MG PO TABS: 4.0000 mg | ORAL_TABLET | Freq: Four times a day (QID) | ORAL | Status: DC | PRN

## 2015-03-02 MED ORDER — ASPIRIN EC 81 MG PO TBEC
81.0000 mg | DELAYED_RELEASE_TABLET | Freq: Every day | ORAL | Status: DC
  Administered 2015-03-02 – 2015-03-04 (×3): 81 mg via ORAL
  Filled 2015-03-02 (×3): qty 1

## 2015-03-02 MED ORDER — ONDANSETRON HCL 4 MG/2ML IJ SOLN: 4.0000 mg | Freq: Four times a day (QID) | INTRAMUSCULAR | Status: DC | PRN

## 2015-03-02 MED ORDER — HYDROCODONE-ACETAMINOPHEN 5-325 MG PO TABS
1.0000 | ORAL_TABLET | ORAL | Status: DC | PRN
  Administered 2015-03-02 (×2): 1 via ORAL
  Administered 2015-03-03 – 2015-03-04 (×4): 2 via ORAL
  Filled 2015-03-02: qty 1
  Filled 2015-03-02 (×4): qty 2
  Filled 2015-03-02: qty 1

## 2015-03-02 MED ORDER — PHENYTOIN SODIUM EXTENDED 100 MG PO CAPS
300.0000 mg | ORAL_CAPSULE | Freq: Every day | ORAL | Status: DC
  Administered 2015-03-03 – 2015-03-04 (×2): 300 mg via ORAL
  Filled 2015-03-02 (×2): qty 3

## 2015-03-02 MED ORDER — ACETAMINOPHEN 325 MG PO TABS: 650.0000 mg | ORAL_TABLET | Freq: Four times a day (QID) | ORAL | Status: DC | PRN

## 2015-03-02 MED ORDER — PIOGLITAZONE HCL 15 MG PO TABS
45.0000 mg | ORAL_TABLET | Freq: Every day | ORAL | Status: DC
  Administered 2015-03-03: 45 mg via ORAL
  Filled 2015-03-02: qty 3

## 2015-03-02 MED ORDER — METFORMIN HCL 500 MG PO TABS
1000.0000 mg | ORAL_TABLET | Freq: Two times a day (BID) | ORAL | Status: DC
Start: 2015-03-02 — End: 2015-03-05
  Filled 2015-03-02 (×2): qty 2

## 2015-03-02 MED ORDER — INSULIN ASPART 100 UNIT/ML ~~LOC~~ SOLN
0.0000 [IU] | Freq: Three times a day (TID) | SUBCUTANEOUS | Status: DC
  Administered 2015-03-02: 1 [IU] via SUBCUTANEOUS
  Administered 2015-03-04 – 2015-03-05 (×3): 2 [IU] via SUBCUTANEOUS
  Filled 2015-03-02: qty 1
  Filled 2015-03-02 (×3): qty 2

## 2015-03-02 NOTE — H&P (Signed)
History and Physical    Dawn Allen QMV:[id number] DOB: 1951/04/01 DOA: 03/02/2015  Referring physician: none PCP: Lorelee Market, MD  Specialists: none  Chief Complaint: positive blood cultures  HPI: Dawn Allen is a 64 y.o. female has a past medical history significant for DVT, HTN, DM, and ASCVD recently admitted for fever. Left AMA after <48 hours. Cultures returned positive for gram positive cocci. She was called and told to return for admission. Of note, pt states her fevers are gone. She does have a lung mass requiring further workup. She also has horrible dental abcesses and caries noted on recent CT.  Review of Systems: The patient denies anorexia, fever, weight loss,, vision loss, decreased hearing, hoarseness, chest pain, syncope, dyspnea on exertion, peripheral edema, balance deficits, hemoptysis, abdominal pain, melena, hematochezia, severe indigestion/heartburn, hematuria, incontinence, genital sores, muscle weakness, suspicious skin lesions, transient blindness, difficulty walking, depression, unusual weight change, abnormal bleeding, enlarged lymph nodes, angioedema, and breast masses.   Past Medical History  Diagnosis Date  . Hypertension   . DVT (deep venous thrombosis)     LEFT LEG   . MI (myocardial infarction)   . Diabetes mellitus without complication   . Seizure disorder   . Coronary artery disease     a. NSTEMI cath 08/02/14: LM nl, pLAD 50%, mLCx 30%, mRCA 95% s/p PCI/DES, 2nd lesion 40%, EF 55%  . HLD (hyperlipidemia)    Past Surgical History  Procedure Laterality Date  . Abdominal hysterectomy      PARTIAL   . Leg surgery Right     BLOOD CLOT  . Cardiac catheterization  08/02/2014  . Coronary angioplasty  08/02/2014    drug eluting stent placement   Social History:  reports that she has been smoking Cigarettes.  She has a 14 pack-year smoking history. She has never used smokeless tobacco. She reports that she does not drink alcohol or use  illicit drugs.  Allergies  Allergen Reactions  . Penicillins     Family History  Problem Relation Age of Onset  . Family history unknown: Yes    Prior to Admission medications   Medication Sig Start Date End Date Taking? Authorizing Provider  atorvastatin (LIPITOR) 40 MG tablet Take 40 mg by mouth daily.   Yes Historical Provider, MD  empagliflozin (JARDIANCE) 25 MG TABS tablet Take 25 mg by mouth daily.   Yes Historical Provider, MD  LINZESS 290 MCG CAPS capsule Take 1 capsule by mouth daily. 02/05/15  Yes Historical Provider, MD  lisinopril-hydrochlorothiazide (PRINZIDE,ZESTORETIC) 20-25 MG per tablet Take 1 tablet by mouth daily.  07/26/14  Yes Historical Provider, MD  nitroGLYCERIN (NITROSTAT) 0.4 MG SL tablet Place 0.4 mg under the tongue every 5 (five) minutes as needed for chest pain.   Yes Historical Provider, MD  phenytoin (DILANTIN) 100 MG ER capsule Take 300 mg by mouth daily.    Yes Historical Provider, MD  pioglitazone (ACTOS) 45 MG tablet Take 1 tablet by mouth daily. 01/28/15  Yes Historical Provider, MD  Vitamin D, Ergocalciferol, (DRISDOL) 50000 UNITS CAPS capsule Take 50,000 Units by mouth every 7 (seven) days.   Yes Historical Provider, MD  XARELTO 10 MG TABS tablet Take 1 tablet by mouth every evening. 02/13/15  Yes Historical Provider, MD  clopidogrel (PLAVIX) 75 MG tablet Take 75 mg by mouth daily.    Historical Provider, MD  diazepam (VALIUM) 5 MG tablet Take 1 tablet by mouth 2 (two) times daily as needed. 01/03/15   Historical Provider,  MD  metFORMIN (GLUCOPHAGE) 1000 MG tablet Take 1,000 mg by mouth 2 (two) times daily with a meal.    Historical Provider, MD  metoprolol tartrate (LOPRESSOR) 25 MG tablet Take 25 mg by mouth 2 (two) times daily.    Historical Provider, MD  PARoxetine (PAXIL) 20 MG tablet Take 1 tablet by mouth daily. 01/16/15   Historical Provider, MD  Saxagliptin-Metformin (KOMBIGLYZE XR) 2.12-998 MG TB24 Take 2 tablets by mouth daily.     Historical  Provider, MD   Physical Exam: Filed Vitals:   03/02/15 1412 03/02/15 1413  BP: 153/74 130/72  Pulse: 86 85  Temp: 98.5 F (36.9 C)   TempSrc: Oral   SpO2: 96% 98%     General:  No apparent distress  Eyes: PERRL, EOMI, no scleral icterus  ENT: moist oropharynx, dentition poor  Neck: supple, no lymphadenopathy  Cardiovascular: regular rate without MRG; 2+ peripheral pulses, no JVD, no peripheral edema  Respiratory: CTA biL, good air movement without wheezing, rhonchi or crackled  Abdomen: soft, non tender to palpation, positive bowel sounds, no guarding, no rebound  Skin: no rashes  Musculoskeletal: normal bulk and tone, no joint swelling  Psychiatric: normal mood and affect  Neurologic: CN 2-12 grossly intact, MS 5/5 in all 4  Labs on Admission:  Basic Metabolic Panel:  Recent Labs Lab 02/27/15 1030 02/27/15 1808  NA 137  --   K 3.4*  --   CL 98*  --   CO2 27  --   GLUCOSE 172*  --   BUN 21*  --   CREATININE 0.93  --   CALCIUM 8.9  --   MG  --  2.2   Liver Function Tests:  Recent Labs Lab 02/27/15 1030  AST 22  ALT 23  ALKPHOS 261*  BILITOT 0.4  PROT 8.4*  ALBUMIN 3.9   No results for input(s): LIPASE, AMYLASE in the last 168 hours. No results for input(s): AMMONIA in the last 168 hours. CBC:  Recent Labs Lab 02/27/15 1030  WBC 13.9*  NEUTROABS 12.1*  HGB 14.9  HCT 45.3  MCV 81.6  PLT 159   Cardiac Enzymes: No results for input(s): CKTOTAL, CKMB, CKMBINDEX, TROPONINI in the last 168 hours.  BNP (last 3 results) No results for input(s): BNP in the last 8760 hours.  ProBNP (last 3 results) No results for input(s): PROBNP in the last 8760 hours.  CBG:  Recent Labs Lab 02/27/15 1648 02/27/15 2042  GLUCAP 204* 214*    Radiological Exams on Admission: No results found.  EKG: Independently reviewed.  Assessment/Plan Principal Problem:   Sepsis Active Problems:   Lung mass   Will admit to floor and resend cultures.  Order UA and CXR. Begin IV ABX. Order PET scan. Order Dental consult. CM consult for D/C planning  Diet: clear liquid Fluids: NS with K+ DVT Prophylaxis: SQ Heparin  Code Status: FULL  Family Communication: no  Disposition Plan: home  Time spent: 50 min

## 2015-03-02 NOTE — Discharge Summary (Signed)
[facility] [place] NAME: Dawn Allen    MR#:  [id number]  DATE OF BIRTH:  10-25-1950  DATE OF ADMISSION:  02/27/2015 ADMITTING PHYSICIAN: Theodoro Grist, MD  DATE OF DISCHARGE: 02/28/2015  8:05 AM  PRIMARY CARE PHYSICIAN: Lorelee Market, MD     ADMISSION DIAGNOSIS:  SIRS (systemic inflammatory response syndrome) [A41.9]  DISCHARGE DIAGNOSIS:  Principal Problem:   SIRS (systemic inflammatory response syndrome) Active Problems:   Leukocytosis   Pain in a tooth or teeth   Hypokalemia   Generalized weakness   Lung nodule   SECONDARY DIAGNOSIS:   Past Medical History  Diagnosis Date  . Hypertension   . DVT (deep venous thrombosis)     LEFT LEG   . MI (myocardial infarction)   . Diabetes mellitus without complication   . Seizure disorder   . Coronary artery disease     a. NSTEMI cath 08/02/14: LM nl, pLAD 50%, mLCx 30%, mRCA 95% s/p PCI/DES, 2nd lesion 40%, EF 55%  . HLD (hyperlipidemia)     .pro HOSPITAL COURSE:   Patient is 64 year old female with past medical history significant for history of hypertension, DVT, coronary artery disease, diabetes, hyperlipidemia who presents to the hospital with complaints of fevers and chills, fatigue and weakness as well as teeth pain after long discussion. Patient admitted of having weight loss. In emergency room, she was noted to have leukocytosis to almost 14,000 as well as hypokalemia of 3.4. Patient was admitted to the hospital with diagnosis of service and started on antibiotic therapy after blood cultures were taken. Chest x-ray done on 02/27/2015 revealed left upper lobe nodule and CT scan of chest was recommended . After further discussion this patient. It appeared that she was having some teeth pain, so CT scan of maxillofacial area without contrast was ordered, which revealed in retrospect numerous dental carious involving majority of maxillary teeth and minimally at the  right mandibular teeth large periapical abscesses were noted bilaterally along the maxilla and right first mandibular molar in the root of the remaining left mandibular molar among other changes.  Unfortunately, she grew progressively inpatient and decided to leave AGAINST MEDICAL ADVICE Discussion by the problem 1. SIRS,  blood cultures taken, patient was initiated on antibiotic therapy with doxycycline. Since there was no obvious source of infection, we will got ehrlichia testing , Lyme disease antibodies, also Rocky mountain spotted fever serology. Upon further questioning, it appeared the patient was also complaining of left lower jaw area tooth pain, so CT scan of maxillofacial area was done to rule out tooth abscess, which unfortunately it failed, revealing multiple abscesses, unlikely reason cause of patient's high fevers,  septic picture. Unfortunate patient left AGAINST MEDICAL ADVICE.  2. Hypokalemia. Supplemented IV, magnesium level was found to be normal 3. Leukocytosis. Follow with antibiotic therapy 4. Generalized weakness. Physical therapist consultation for further recommendations was requested, but  patient did not wait for it.  5. Lung nodule. Patient was advised to follow-up with cancer Center as outpatient 6. Tobacco abuse. Discussed this patient for approximately 5 minutes. Nicotine replacement therapy was initiated , for which patient was agreeable DISCHARGE CONDITIONS:   Stable  CONSULTS OBTAINED:     DRUG ALLERGIES:   Allergies  Allergen Reactions  . Penicillins     DISCHARGE MEDICATIONS:   Discharge Medication List as of 02/28/2015  8:05 AM    CONTINUE these medications which have NOT CHANGED   Details  atorvastatin (LIPITOR) 40  MG tablet Take 40 mg by mouth daily., Until Discontinued, Historical Med    clopidogrel (PLAVIX) 75 MG tablet Take 75 mg by mouth daily., Until Discontinued, Historical Med    diazepam (VALIUM) 5 MG tablet Take 1 tablet by mouth 2  (two) times daily as needed., Starting 01/03/2015, Until Discontinued, Historical Med    empagliflozin (JARDIANCE) 25 MG TABS tablet Take 25 mg by mouth daily., Until Discontinued, Historical Med    LINZESS 290 MCG CAPS capsule Take 1 capsule by mouth daily., Starting 02/05/2015, Until Discontinued, Historical Med    lisinopril-hydrochlorothiazide (PRINZIDE,ZESTORETIC) 20-25 MG per tablet Take 1 tablet by mouth daily. , Starting 07/26/2014, Until Discontinued, Historical Med    metFORMIN (GLUCOPHAGE) 1000 MG tablet Take 1,000 mg by mouth 2 (two) times daily with a meal., Until Discontinued, Historical Med    metoprolol tartrate (LOPRESSOR) 25 MG tablet Take 25 mg by mouth 2 (two) times daily., Until Discontinued, Historical Med    nitroGLYCERIN (NITROSTAT) 0.4 MG SL tablet Place 0.4 mg under the tongue every 5 (five) minutes as needed for chest pain., Until Discontinued, Historical Med    PARoxetine (PAXIL) 20 MG tablet Take 1 tablet by mouth daily., Starting 01/16/2015, Until Discontinued, Historical Med    phenytoin (DILANTIN) 100 MG ER capsule Take 300 mg by mouth daily. , Until Discontinued, Historical Med    pioglitazone (ACTOS) 45 MG tablet Take 1 tablet by mouth daily., Starting 01/28/2015, Until Discontinued, Historical Med    Saxagliptin-Metformin (KOMBIGLYZE XR) 2.12-998 MG TB24 Take 2 tablets by mouth daily. , Until Discontinued, Historical Med    XARELTO 10 MG TABS tablet Take 1 tablet by mouth every evening., Starting 02/13/2015, Until Discontinued, Historical Med         DISCHARGE INSTRUCTIONS:    Patient is to follow-up with her primary care physician ASAP for further management of her condition  If you experience worsening of your admission symptoms, develop shortness of breath, life threatening emergency, suicidal or homicidal thoughts you must seek medical attention immediately by calling 911 or calling your MD immediately  if symptoms less severe.  You Must read  complete instructions/literature along with all the possible adverse reactions/side effects for all the Medicines you take and that have been prescribed to you. Take any new Medicines after you have completely understood and accept all the possible adverse reactions/side effects.   Please note  You were cared for by a hospitalist during your hospital stay. If you have any questions about your discharge medications or the care you received while you were in the hospital after you are discharged, you can call the unit and asked to speak with the hospitalist on call if the hospitalist that took care of you is not available. Once you are discharged, your primary care physician will handle any further medical issues. Please note that NO REFILLS for any discharge medications will be authorized once you are discharged, as it is imperative that you return to your primary care physician (or establish a relationship with a primary care physician if you do not have one) for your aftercare needs so that they can reassess your need for medications and monitor your lab values.    Today   CHIEF COMPLAINT:   Chief Complaint  Patient presents with  . Fever    HISTORY OF PRESENT ILLNESS:  Blanche Scovell  is a 64 y.o. female with a known history of  hypertension, DVT, coronary artery disease, diabetes, hyperlipidemia who presents to the hospital with complaints  of fevers and chills, fatigue and weakness as well as teeth pain after long discussion. Patient admitted of having weight loss. In emergency room, she was noted to have leukocytosis to almost 14,000 as well as hypokalemia of 3.4. Patient was admitted to the hospital with diagnosis of service and started on antibiotic therapy after blood cultures were taken. Chest x-ray done on 02/27/2015 revealed left upper lobe nodule and CT scan of chest was recommended . After further discussion this patient. It appeared that she was having some teeth pain, so CT scan of  maxillofacial area without contrast was ordered, which revealed in retrospect numerous dental carious involving majority of maxillary teeth and minimally at the right mandibular teeth large periapical abscesses were noted bilaterally along the maxilla and right first mandibular molar in the root of the remaining left mandibular molar among other changes.  Unfortunately, she grew progressively inpatient and decided to leave AGAINST MEDICAL ADVICE Discussion by the problem 1. SIRS,  blood cultures taken, patient was initiated on antibiotic therapy with doxycycline. Since there was no obvious source of infection, we will got ehrlichia testing , Lyme disease antibodies, also Rocky mountain spotted fever serology. Upon further questioning, it appeared the patient was also complaining of left lower jaw area tooth pain, so CT scan of maxillofacial area was done to rule out tooth abscess, which unfortunately it failed, revealing multiple abscesses, unlikely reason cause of patient's high fevers,  septic picture. Unfortunate patient left AGAINST MEDICAL ADVICE.  2. Hypokalemia. Supplemented IV, magnesium level was found to be normal 3. Leukocytosis. Follow with antibiotic therapy 4. Generalized weakness. Physical therapist consultation for further recommendations was requested, but  patient did not wait for it.  5. Lung nodule. Patient was advised to follow-up with cancer Center as outpatient 6. Tobacco abuse. Discussed this patient for approximately 5 minutes. Nicotine replacement therapy was initiated , for which patient was agreeable    VITAL SIGNS:  Blood pressure 147/53, pulse 57, temperature 98.5 F (36.9 C), temperature source Oral, resp. rate 18, height '5\' 5"'$  (1.651 m), weight 91.173 kg (201 lb), SpO2 94 %.  I/O:  No intake or output data in the 24 hours ending 03/02/15 1412  PHYSICAL EXAMINATION:  GENERAL:  64 y.o.-year-old patient lying in the bed with no acute distress.  EYES: Pupils equal,  round, reactive to light and accommodation. No scleral icterus. Extraocular muscles intact.  HEENT: Head atraumatic, normocephalic. Oropharynx and nasopharynx clear.  NECK:  Supple, no jugular venous distention. No thyroid enlargement, no tenderness.  LUNGS: Normal breath sounds bilaterally, no wheezing, rales,rhonchi or crepitation. No use of accessory muscles of respiration.  CARDIOVASCULAR: S1, S2 normal. No murmurs, rubs, or gallops.  ABDOMEN: Soft, non-tender, non-distended. Bowel sounds present. No organomegaly or mass.  EXTREMITIES: No pedal edema, cyanosis, or clubbing.  NEUROLOGIC: Cranial nerves II through XII are intact. Muscle strength 5/5 in all extremities. Sensation intact. Gait not checked.  PSYCHIATRIC: The patient is alert and oriented x 3.  SKIN: No obvious rash, lesion, or ulcer.   DATA REVIEW:   CBC  Recent Labs Lab 02/27/15 1030  WBC 13.9*  HGB 14.9  HCT 45.3  PLT 159    Chemistries   Recent Labs Lab 02/27/15 1030 02/27/15 1808  NA 137  --   K 3.4*  --   CL 98*  --   CO2 27  --   GLUCOSE 172*  --   BUN 21*  --   CREATININE 0.93  --   CALCIUM  8.9  --   MG  --  2.2  AST 22  --   ALT 23  --   ALKPHOS 261*  --   BILITOT 0.4  --     Cardiac Enzymes No results for input(s): TROPONINI in the last 168 hours.  Microbiology Results  Results for orders placed or performed during the hospital encounter of 02/27/15  Culture, blood (routine x 2)     Status: None (Preliminary result)   Collection Time: 02/27/15 10:30 AM  Result Value Ref Range Status   Specimen Description BLOOD LEFT ASSIST CONTROL  Final   Special Requests   Final    BOTTLES DRAWN AEROBIC AND ANAEROBIC  5 CC ANAEROBIC, 3 CC AEROBIC   Culture  Setup Time   Final    GRAM POSITIVE COCCI ANAEROBIC BOTTLE ONLY CRITICAL RESULT CALLED TO, READ BACK BY AND VERIFIED WITH: STEPHANIE BROTHERS AT 060 03/02/15.[facility] CONFIRMED BY [facility]    Culture   Final    GRAM POSITIVE COCCI ANAEROBIC BOTTLE  ONLY IDENTIFICATION TO FOLLOW    Report Status PENDING  Incomplete  Urine culture     Status: None   Collection Time: 02/27/15 10:30 AM  Result Value Ref Range Status   Specimen Description URINE, CLEAN CATCH  Final   Special Requests Normal  Final   Culture   Final    MULTIPLE SPECIES PRESENT, SUGGEST RECOLLECTION IF CLINICALLY INDICATED   Report Status 02/28/2015 FINAL  Final  Culture, blood (routine x 2)     Status: None (Preliminary result)   Collection Time: 02/27/15 10:34 AM  Result Value Ref Range Status   Specimen Description BLOOD LEFT HAND  Final   Special Requests   Final    BOTTLES DRAWN AEROBIC AND ANAEROBIC  3 CC AEROBIC, 5 CC ANAEROBIC   Culture NO GROWTH 3 DAYS  Final   Report Status PENDING  Incomplete    RADIOLOGY:  No results found.  EKG:   Orders placed or performed in visit on 10/10/14  . EKG      Management plans discussed with the patient, family and they are in agreement.  CODE STATUS:   TOTAL TIME TAKING CARE OF THIS PATIENT: 40 minutes.    Theodoro Grist M.D on 03/02/2015 at 2:12 PM  Between 7am to 6pm - Pager - [phone]  After 6pm go to [email or web address] - password [email or web address] [facility]  Office  [phone]  CC: Primary care physician; Lorelee Market, MD

## 2015-03-03 ENCOUNTER — Inpatient Hospital Stay (HOSPITAL_COMMUNITY)
Admission: AD | Admit: 2015-03-03 | Discharge: 2015-03-03 | Disposition: A | Discharge status: Home / Self Care | Payer: Medicare Other | Source: Ambulatory Visit | Attending: Internal Medicine | Admitting: Internal Medicine

## 2015-03-03 DIAGNOSIS — R7881 Bacteremia: Secondary | ICD-10-CM

## 2015-03-03 LAB — BASIC METABOLIC PANEL
Anion gap: 7 (ref 5–15)
BUN: 24 mg/dL — AB (ref 6–20)
CO2: 26 mmol/L (ref 22–32)
Calcium: 8.3 mg/dL — ABNORMAL LOW (ref 8.9–10.3)
Chloride: 103 mmol/L (ref 101–111)
Creatinine, Ser: 0.86 mg/dL (ref 0.44–1.00)
GFR calc Af Amer: >60 mL/min (ref >60)
GFR calc non Af Amer: >60 mL/min (ref >60)
Glucose, Bld: 162 mg/dL — ABNORMAL HIGH (ref 65–99)
Potassium: 3.5 mmol/L (ref 3.5–5.1)
Sodium: 136 mmol/L (ref 135–145)

## 2015-03-03 LAB — CBC
HCT: 38.4 % (ref 35.0–47.0)
HEMOGLOBIN: 12.5 g/dL (ref 12.0–16.0)
MCH: 26.6 pg (ref 26.0–34.0)
MCHC: 32.5 g/dL (ref 32.0–36.0)
MCV: 81.9 fL (ref 80.0–100.0)
Platelets: 139 10*3/uL — ABNORMAL LOW (ref 150–440)
RBC: 4.68 MIL/uL (ref 3.80–5.20)
RDW: 16.4 % — AB (ref 11.5–14.5)
WBC: 6.4 10*3/uL (ref 3.6–11.0)

## 2015-03-03 LAB — HEMOGLOBIN A1C: HEMOGLOBIN A1C: 9.4 % — AB (ref 4.0–6.0)

## 2015-03-03 LAB — GLUCOSE, CAPILLARY
Glucose-Capillary: 148 mg/dL — ABNORMAL HIGH (ref 65–99)
Glucose-Capillary: 146 mg/dL — ABNORMAL HIGH (ref 65–99)

## 2015-03-03 MED ORDER — HYDROMORPHONE HCL 1 MG/ML IJ SOLN
2.0000 mg | Freq: Once | INTRAMUSCULAR | Status: AC
  Administered 2015-03-03: 2 mg via INTRAVENOUS
  Filled 2015-03-03: qty 2

## 2015-03-03 MED ORDER — RIVAROXABAN 20 MG PO TABS
20.0000 mg | ORAL_TABLET | Freq: Every day | ORAL | Status: DC
  Administered 2015-03-03 – 2015-03-04 (×2): 20 mg via ORAL
  Filled 2015-03-03 (×2): qty 1

## 2015-03-03 MED ORDER — MORPHINE SULFATE 4 MG/ML IJ SOLN
4.0000 mg | INTRAMUSCULAR | Status: DC | PRN
  Administered 2015-03-03 – 2015-03-05 (×7): 4 mg via INTRAVENOUS
  Filled 2015-03-03 (×7): qty 1

## 2015-03-03 NOTE — Progress Notes (Signed)
[facility] [place] NAME: Dawn Allen    MR#:  [id number]  DATE OF BIRTH:  [birth year]  SUBJECTIVE:  CHIEF COMPLAINT:  No chief complaint on file. wanted to leave AMA but now agreeable to stay REVIEW OF SYSTEMS:  Review of Systems  Constitutional: Negative for fever, weight loss, malaise/fatigue and diaphoresis.  HENT: Negative for ear discharge, ear pain, hearing loss, nosebleeds, sore throat and tinnitus.   Eyes: Negative for blurred vision and pain.  Respiratory: Negative for cough, hemoptysis, shortness of breath and wheezing.   Cardiovascular: Negative for chest pain, palpitations, orthopnea and leg swelling.  Gastrointestinal: Negative for heartburn, nausea, vomiting, abdominal pain, diarrhea, constipation and blood in stool.  Genitourinary: Negative for dysuria, urgency and frequency.  Musculoskeletal: Negative for myalgias and back pain.  Skin: Negative for itching and rash.  Neurological: Negative for dizziness, tingling, tremors, focal weakness, seizures, weakness and headaches.  Psychiatric/Behavioral: Negative for depression. The patient is not nervous/anxious.    DRUG ALLERGIES:   Allergies  Allergen Reactions  . Penicillins    VITALS:  Blood pressure 140/77, pulse 74, temperature 98.4 F (36.9 C), temperature source Oral, resp. rate 16, height '5\' 5"'$  (1.651 m), weight 91.899 kg (202 lb 9.6 oz), SpO2 99 %. PHYSICAL EXAMINATION:  Physical Exam  Constitutional: She is oriented to person, place, and time and well-developed, well-nourished, and in no distress.  HENT:  Head: Normocephalic and atraumatic.  Mouth/Throat: Dental abscesses and dental caries present.    Eyes: Conjunctivae and EOM are normal. Pupils are equal, round, and reactive to light.  Neck: Normal range of motion. Neck supple. No tracheal deviation present. No thyromegaly present.  Cardiovascular: Normal rate, regular rhythm and normal heart sounds.    Pulmonary/Chest: Effort normal and breath sounds normal. No respiratory distress. She has no wheezes. She exhibits no tenderness.  Abdominal: Soft. Bowel sounds are normal. She exhibits no distension. There is no tenderness.  Musculoskeletal: Normal range of motion.  Neurological: She is alert and oriented to person, place, and time. No cranial nerve deficit.  Skin: Skin is warm and dry. No rash noted.  Psychiatric: Mood and affect normal.   LABORATORY PANEL:   CBC  Recent Labs Lab 03/03/15 0443  WBC 6.4  HGB 12.5  HCT 38.4  PLT 139*   ------------------------------------------------------------------------------------------------------------------ Chemistries   Recent Labs Lab 02/27/15 1808 03/02/15 1554 03/03/15 0443  NA  --  138 136  K  --  3.5 3.5  CL  --  101 103  CO2  --  27 26  GLUCOSE  --  144* 162*  BUN  --  20 24*  CREATININE  --  1.07* 0.86  CALCIUM  --  8.8* 8.3*  MG 2.2  --   --   AST  --  21  --   ALT  --  23  --   ALKPHOS  --  174*  --   BILITOT  --  0.3  --    RADIOLOGY:  Dg Chest 2 View  03/02/2015   CLINICAL DATA:  Fever  EXAM: CHEST  2 VIEW  COMPARISON:  CT chest dated 02/27/2015  FINDINGS: Minimal patchy/ nodular opacity in the left upper lobe. Lungs are otherwise clear. No pleural effusion or pneumothorax.  The heart is normal in size.  Degenerative changes of the visualized thoracolumbar spine.  IVC filter.  IMPRESSION: Minimal patchy/ nodular opacity in the left upper lobe, better evaluated on CT chest.  No focal consolidation.   Electronically Signed   By: Julian Hy M.D.   On: 03/02/2015 17:46   ASSESSMENT AND PLAN:  * Strep Viridans bacteremia: 1/2 bottles, likely false + with no fever, leukocytosis or clinical indicators suggestive although she does have dental abscesses. Will get echo ad await repeat blood c/s from y'day, continue rocephin for now. Can D/C on PO AUGMENTIN if c/s remain neg.  * Lung mass: PET scan tomorrow  *  tobacco abuse: counselled for 3 mins. Not ready to quit yet.  * dental abcesses and caries: outpt dental eval   All the records are reviewed and case discussed with Care Management/Social Workerr. Management plans discussed with the patient, family and they are in agreement.  CODE STATUS: full code  TOTAL TIME TAKING CARE OF THIS PATIENT: 35 minutes.   More than 50% of the time was spent in counseling/coordination of care: YES  POSSIBLE D/C IN 1 DAYS, DEPENDING ON CLINICAL CONDITION.   [facility], Duffy Dantonio M.D on 03/03/2015 at 3:35 PM  Between 7am to 6pm - Pager - [phone]  After 6pm go to [email or web address] - password [email or web address] [facility]  Office  [phone]  CC:  Primary care physician; Lorelee Market, MD

## 2015-03-03 NOTE — Progress Notes (Signed)
*  PRELIMINARY RESULTS* Echocardiogram 2D Echocardiogram has been performed.  Dawn Allen 03/03/2015, 5:59 PM

## 2015-03-03 NOTE — Care Management (Addendum)
Spoke with patient for discharge planning. Patient is from home alone and stated that she is independent.She denies falls in the past. She stated that she doe not use walker or any other DME. Uses [employer] bus for transportation . PCP is Dr Eugenia Pancoast. Patient has used Dental clinic in [place] prior for dental problems and I will provide her with contact information for this prior to discharge. Patient stated that she will be able to get transportation there. For PET scan tomorrow. Patient stated that she has a "State Nurse"Joyce Wynetta Emery [phone] (number is incorrect) that comes out to see her twice yearly and that she also has an Loss adjuster, chartered" that comes every quarter to see her. She does not know any other number or what agency [employer] works for. Denies difficulty with obtaining medications. Continue to follow for discharge needs. [facility] [phone] [email or web address] placed on patient's discharge instructions. She does not have access to internet. I have attempted twice to reach [facility] but they are not answering the phone. Message left at [place] for follow up with patient. Patient updated.   Patient contacted this RNCM back and stated that she would rather go to [facility]. I have left message at [facility] dentistry Address: [address] [place], [place] [address] Phone: [phone]. When appointment is made this appointment date/time will need to be placed on patient's follow up instructions.  Appointment made: July 19 at [facility] [place].

## 2015-03-03 NOTE — Progress Notes (Signed)
Pt refusing IV maintenance fluids and IV restart. Pt educated by staff. Pt expresses desire to leave AMA. MD aware. Nurse Supervisor on floor to speak with pt.

## 2015-03-04 LAB — URINE CULTURE: SPECIAL REQUESTS: NORMAL

## 2015-03-04 LAB — CULTURE, BLOOD (ROUTINE X 2): Culture: NO GROWTH

## 2015-03-04 LAB — GLUCOSE, CAPILLARY
GLUCOSE-CAPILLARY: 132 mg/dL — AB (ref 65–99)
GLUCOSE-CAPILLARY: 117 mg/dL — AB (ref 65–99)
Glucose-Capillary: 154 mg/dL — ABNORMAL HIGH (ref 65–99)
Glucose-Capillary: 162 mg/dL — ABNORMAL HIGH (ref 65–99)

## 2015-03-04 MED ORDER — LORAZEPAM 2 MG/ML IJ SOLN: 1.0000 mg | Freq: Four times a day (QID) | INTRAMUSCULAR | Status: DC | PRN

## 2015-03-04 MED ORDER — CEFTRIAXONE SODIUM IN DEXTROSE 20 MG/ML IV SOLN
1.0000 g | INTRAVENOUS | Status: DC
  Filled 2015-03-04 (×2): qty 50

## 2015-03-04 NOTE — Progress Notes (Signed)
Pt needs PET scan per MD. Order entered. Received call from PET scan dept stating they did not have time and they prefer if it was outpatient Called MD again and he stated he did not feel comfortable discharging pt without PET scan being done since her CT scan was concerning for lung masses. Called PET scan again and pt is on schedule for 0830 tomorrow Am. Will be NPO after midnight

## 2015-03-04 NOTE — Progress Notes (Signed)
[place] at [place] NAME: Dawn Allen    MR#:  [id number]  DATE OF BIRTH:  01-02-1951  SUBJECTIVE:  CHIEF COMPLAINT:  No chief complaint on file. was planned for D/C after PET but she ate and nuclear lab says - it will happen tomorrow  REVIEW OF SYSTEMS:  Review of Systems  Constitutional: Negative for fever, weight loss, malaise/fatigue and diaphoresis.  HENT: Negative for ear discharge, ear pain, hearing loss, nosebleeds, sore throat and tinnitus.   Eyes: Negative for blurred vision and pain.  Respiratory: Negative for cough, hemoptysis, shortness of breath and wheezing.   Cardiovascular: Negative for chest pain, palpitations, orthopnea and leg swelling.  Gastrointestinal: Negative for heartburn, nausea, vomiting, abdominal pain, diarrhea, constipation and blood in stool.  Genitourinary: Negative for dysuria, urgency and frequency.  Musculoskeletal: Negative for myalgias and back pain.  Skin: Negative for itching and rash.  Neurological: Negative for dizziness, tingling, tremors, focal weakness, seizures, weakness and headaches.  Psychiatric/Behavioral: Negative for depression. The patient is not nervous/anxious.    DRUG ALLERGIES:   Allergies  Allergen Reactions  . Penicillins    VITALS:  Blood pressure 163/75, pulse 75, temperature 98.3 F (36.8 C), temperature source Oral, resp. rate 19, height '5\' 5"'$  (1.651 m), weight 94.031 kg (207 lb 4.8 oz), SpO2 100 %. PHYSICAL EXAMINATION:  Physical Exam  Constitutional: She is oriented to person, place, and time and well-developed, well-nourished, and in no distress.  HENT:  Head: Normocephalic and atraumatic.  Mouth/Throat: Dental abscesses and dental caries present.    Eyes: Conjunctivae and EOM are normal. Pupils are equal, round, and reactive to light.  Neck: Normal range of motion. Neck supple. No tracheal deviation present. No thyromegaly present.  Cardiovascular: Normal  rate, regular rhythm and normal heart sounds.   Pulmonary/Chest: Effort normal and breath sounds normal. No respiratory distress. She has no wheezes. She exhibits no tenderness.  Abdominal: Soft. Bowel sounds are normal. She exhibits no distension. There is no tenderness.  Musculoskeletal: Normal range of motion.  Neurological: She is alert and oriented to person, place, and time. No cranial nerve deficit.  Skin: Skin is warm and dry. No rash noted.  Psychiatric: Mood and affect normal.   LABORATORY PANEL:   CBC  Recent Labs Lab 03/03/15 0443  WBC 6.4  HGB 12.5  HCT 38.4  PLT 139*   ------------------------------------------------------------------------------------------------------------------ Chemistries   Recent Labs Lab 02/27/15 1808 03/02/15 1554 03/03/15 0443  NA  --  138 136  K  --  3.5 3.5  CL  --  101 103  CO2  --  27 26  GLUCOSE  --  144* 162*  BUN  --  20 24*  CREATININE  --  1.07* 0.86  CALCIUM  --  8.8* 8.3*  MG 2.2  --   --   AST  --  21  --   ALT  --  23  --   ALKPHOS  --  174*  --   BILITOT  --  0.3  --    RADIOLOGY:  No results found. ASSESSMENT AND PLAN:  * Strep Viridans bacteremia: 1/2 bottles, likely false + with no fever, leukocytosis or clinical indicators suggestive although she does have dental abscesses. Echo not showing ay vegetations, continue rocephin for now. Can D/C on PO AUGMENTIN if c/s remain neg.  * Lung mass: PET scan tomorrow  * tobacco abuse: counselled for 3 mins. Not ready to quit yet.  *  dental abcesses and caries: outpt dental eval   All the records are reviewed and case discussed with Care Management/Social Workerr. Management plans discussed with the patient, family and they are in agreement.  CODE STATUS: full code  TOTAL TIME TAKING CARE OF THIS PATIENT: 35 minutes.   More than 50% of the time was spent in counseling/coordination of care: YES  POSSIBLE D/C IN 1 DAYS, DEPENDING ON CLINICAL  CONDITION.   [facility], Merrill Villarruel M.D on 03/04/2015 at 12:35 PM  Between 7am to 6pm - Pager - [phone]  After 6pm go to [email or web address] - password [email or web address] [facility]  Office  [phone]  CC:  Primary care physician; Lorelee Market, MD

## 2015-03-04 NOTE — Care Management Important Message (Signed)
Important Message  Patient Details  Name: Dawn Allen MRN: [id number] Date of Birth: [birth year]   Medicare Important Message Given:  Yes-second notification given    Juliann Pulse A Allmond 03/04/2015, 11:08 AM

## 2015-03-04 NOTE — Progress Notes (Signed)
Inpatient Diabetes Program Recommendations  AACE/ADA: New Consensus Statement on Inpatient Glycemic Control (2013)  Target Ranges:  Prepandial:   less than 140 mg/dL      Peak postprandial:   less than 180 mg/dL (1-2 hours)      Critically ill patients:  140 - 180 mg/dL   Reason for Visit: elevated A1C  Spoke with patient at the bedside re: elevated A1C- understands it but is pleased that it has decreased from 12%, then 10% now 9.2%.  MD has encouraged her to start taking insulin but she has refused because of fear of pain- she is very pleased with her blood sugars here at the hospital and wants to begin insulin.  Discussed Lantus and Novolog- reluctant to take multiple doses per day but willing to take once a day Lantus to start. Would like a pen and she will discuss with MD at next visit.  Home stress, recent ingrown toenail and dental hygiene are likely contrubuting to elevate blood sugars which she states are generally in the high 200's and low 300's most of the time.   Gentry Fitz, RN, BA, MHA, CDE Diabetes Coordinator Inpatient Diabetes Program  [phone] (Team Pager) [phone] ([place]) 03/04/2015 2:23 PM

## 2015-03-05 ENCOUNTER — Ambulatory Visit: Payer: Medicare Other

## 2015-03-05 ENCOUNTER — Inpatient Hospital Stay: Payer: Medicare Other

## 2015-03-05 ENCOUNTER — Encounter: Payer: Self-pay | Admitting: Radiology

## 2015-03-05 LAB — GLUCOSE, CAPILLARY
Glucose-Capillary: 160 mg/dL — ABNORMAL HIGH (ref 65–99)
Glucose-Capillary: 128 mg/dL — ABNORMAL HIGH (ref 65–99)

## 2015-03-05 LAB — CULTURE, BLOOD (ROUTINE X 2)

## 2015-03-05 MED ORDER — CLINDAMYCIN HCL 150 MG PO CAPS: 300.0000 mg | ORAL_CAPSULE | Freq: Three times a day (TID) | ORAL | Status: DC

## 2015-03-05 MED ORDER — CLINDAMYCIN HCL 300 MG PO CAPS: 300.0000 mg | ORAL_CAPSULE | Freq: Three times a day (TID) | ORAL | Status: DC

## 2015-03-05 MED ORDER — HYDROCODONE-ACETAMINOPHEN 5-325 MG PO TABS
1.0000 | ORAL_TABLET | Freq: Three times a day (TID) | ORAL | Status: DC | PRN
Start: 2015-03-05 — End: 2015-08-14

## 2015-03-05 MED ORDER — IOHEXOL 300 MG/ML  SOLN
75.0000 mL | Freq: Once | INTRAMUSCULAR | Status: AC | PRN
  Administered 2015-03-05: 75 mL via INTRAVENOUS

## 2015-03-05 NOTE — Progress Notes (Signed)
Patient very adamant about being discharged today after PET scan. Pt kept NPO and given 2 units of Insulin per active orders in the chart. Pt sent down to scan per policy and could not have PET scan performed due to being given insulin. As this new information was given to the RN, MD notified and unaware of this information as well. PET scan discontinued and CT scan ordered per Dr. Posey Pronto. Pt began to get very defensive and insisted she be discharged from the hospital right away. As Dr. Posey Pronto tried to inform pt of changes to plan of care, the pt did not give Dr. Posey Pronto a chance to explain herself and her best interest for the patient. Dr. Posey Pronto stated that she did not feel comfortable discharging the pt and said the pt could leave against medical advice.  Pt refused to leave against medical advice and demanded her discharge paperwork, while being rude to the staff. Appropriate follow up taken by nursing staff and management to persuade the patient to allow CT scan to be performed. CT scan performed and pt was found to have lung nodule present. Dr. Posey Pronto informed pt of results and discharged the patient with appropriate follow up/outpatient care ordered. Discharged per MD orders. Discharge instructions reviewed with pt and pt verbalized understanding. IV removed per policy. Pain medication prescription given to pt and antibiotic prescription called into pharmacy. Pt then stated that her ride was downstairs waiting for her at the medical mall entrance. Pt discharged via wheelchair escorted by [place]. [facility] staff called nursing station to inform staff that the patient's ride was not yet here to pick her up. Pt very persuasive and refused to come back to floor and await ride to arrive. The charge nurse then went down and talked to patient into returning to the unit to wait for ride to arrive, although she was very unhappy. Once her ride arrived, she was discharged via wheelchair escorted by nursing staff.

## 2015-03-05 NOTE — Progress Notes (Signed)
Pt very upset when I entered the room. Her PET scan was apparently cancelled but now able to do it and pt is very frustrated and did not even give me a chance to talk or explained. She rushed out of the batheroom saying she needs her discharge paper, she is too old to go around like this and will f/u with her PCP. Requested I talk to her but refused. I am not discharging the patient and so she can leave AMA if she wishes to. RN informed

## 2015-03-05 NOTE — Discharge Summary (Addendum)
[facility] [place] NAME: Dawn Allen    MR#:  [id number]  DATE OF BIRTH:  05-17-1951  DATE OF ADMISSION:  03/02/2015 ADMITTING PHYSICIAN: Idelle Crouch, MD  DATE OF d/c July 13th  PRIMARY CARE PHYSICIAN: Lorelee Market, MD    ADMISSION DIAGNOSIS:  sepsis  DISCHARGE DIAGNOSIS:  Dental abscess Right lung nodule/mass positive  CT chest  SECONDARY DIAGNOSIS:   Past Medical History  Diagnosis Date  . Hypertension   . DVT (deep venous thrombosis)     LEFT LEG   . MI (myocardial infarction)   . Diabetes mellitus without complication   . Seizure disorder   . Coronary artery disease     a. NSTEMI cath 08/02/14: LM nl, pLAD 50%, mLCx 30%, mRCA 95% s/p PCI/DES, 2nd lesion 40%, EF 55%  . HLD (hyperlipidemia)     HOSPITAL COURSE:  Dawn Allen is a 64 y.o. female has a past medical history significant for DVT, HTN, DM, and ASCVD recently admitted for fever. Left AMA after <48 hours. Cultures returned positive for gram positive cocci. She was called and told to return for admission  *trep Viridans bacteremia: 1/2 bottles, likely false + with no fever, leukocytosis or clinical indicators suggestive although she does have dental abscesses. Echo not showing ay vegetations, recieved rocephin  -All repeat BC negative  *dental caries Pt advised to follow with dentist as outpt Po clindamycin at d/c  *left UL lung mass in the setting of Tobacco abuse CT chest shows increase in size since 2015 worrisome for bronchogenic ca\\spoke with pt and will f/u with Dr Oliva Bustard as outpt for durther w/u Dr Oliva Bustard aware  * tobacco abuse: counselled for 3 mins at admission. Not ready to quit yet.  Will d/c home   DRUG ALLERGIES:   Allergies  Allergen Reactions  . Penicillins     DISCHARGE MEDICATIONS:   Current Discharge Medication List    START taking these medications   Details  clindamycin (CLEOCIN) 300 MG capsule Take 1  capsule (300 mg total) by mouth every 8 (eight) hours. Qty: 21 capsule, Refills: 0    HYDROcodone-acetaminophen (NORCO/VICODIN) 5-325 MG per tablet Take 1 tablet by mouth every 8 (eight) hours as needed for moderate pain. Qty: 30 tablet, Refills: 0      CONTINUE these medications which have NOT CHANGED   Details  atorvastatin (LIPITOR) 40 MG tablet Take 40 mg by mouth daily.    empagliflozin (JARDIANCE) 25 MG TABS tablet Take 25 mg by mouth daily.    LINZESS 290 MCG CAPS capsule Take 1 capsule by mouth daily.    lisinopril-hydrochlorothiazide (PRINZIDE,ZESTORETIC) 20-25 MG per tablet Take 1 tablet by mouth daily.     nitroGLYCERIN (NITROSTAT) 0.4 MG SL tablet Place 0.4 mg under the tongue every 5 (five) minutes as needed for chest pain.    phenytoin (DILANTIN) 100 MG ER capsule Take 300 mg by mouth daily.     pioglitazone (ACTOS) 45 MG tablet Take 1 tablet by mouth daily.    Vitamin D, Ergocalciferol, (DRISDOL) 50000 UNITS CAPS capsule Take 50,000 Units by mouth every 7 (seven) days.    XARELTO 10 MG TABS tablet Take 1 tablet by mouth every evening.    clopidogrel (PLAVIX) 75 MG tablet Take 75 mg by mouth daily.    diazepam (VALIUM) 5 MG tablet Take 1 tablet by mouth 2 (two) times daily as needed.    metFORMIN (GLUCOPHAGE) 1000 MG tablet Take  1,000 mg by mouth 2 (two) times daily with a meal.    metoprolol tartrate (LOPRESSOR) 25 MG tablet Take 25 mg by mouth 2 (two) times daily.    PARoxetine (PAXIL) 20 MG tablet Take 1 tablet by mouth daily.    Saxagliptin-Metformin (KOMBIGLYZE XR) 2.12-998 MG TB24 Take 2 tablets by mouth daily.         If you experience worsening of your admission symptoms, develop shortness of breath, life threatening emergency, suicidal or homicidal thoughts you must seek medical attention immediately by calling 911 or calling your MD immediately  if symptoms less severe.  You Must read complete instructions/literature along with all the possible  adverse reactions/side effects for all the Medicines you take and that have been prescribed to you. Take any new Medicines after you have completely understood and accept all the possible adverse reactions/side effects.    DATA REVIEW:   CBC   Recent Labs Lab 03/03/15 0443  WBC 6.4  HGB 12.5  HCT 38.4  PLT 139*    Chemistries   Recent Labs Lab 02/27/15 1808 03/02/15 1554 03/03/15 0443  NA  --  138 136  K  --  3.5 3.5  CL  --  101 103  CO2  --  27 26  GLUCOSE  --  144* 162*  BUN  --  20 24*  CREATININE  --  1.07* 0.86  CALCIUM  --  8.8* 8.3*  MG 2.2  --   --   AST  --  21  --   ALT  --  23  --   ALKPHOS  --  174*  --   BILITOT  --  0.3  --     CODE STATUS:     Code Status Orders        Start     Ordered   03/02/15 1543  Full code   Continuous     03/02/15 1542      TOTAL TIME TAKING CARE OF THIS PATIENT: 40 minutes.    Akshith Moncus M.D on 03/05/2015 at 11:58 AM  Between 7am to 6pm - Pager - [phone] After 6pm go to [email or web address] - password [email or web address] [facility]  Office  [phone]  CC: Primary care physician; Lorelee Market, MD

## 2015-03-06 ENCOUNTER — Encounter: Payer: Self-pay | Admitting: Cardiothoracic Surgery

## 2015-03-06 ENCOUNTER — Inpatient Hospital Stay: Payer: Medicare Other | Attending: Cardiothoracic Surgery | Admitting: Cardiothoracic Surgery

## 2015-03-06 ENCOUNTER — Encounter: Payer: Self-pay | Admitting: *Deleted

## 2015-03-06 VITALS — BP 131/80 | HR 70 | Temp 98.0°F | Resp 18 | Ht 65.0 in | Wt 204.0 lb

## 2015-03-06 DIAGNOSIS — R911 Solitary pulmonary nodule: Secondary | ICD-10-CM | POA: Diagnosis not present

## 2015-03-06 DIAGNOSIS — R918 Other nonspecific abnormal finding of lung field: Secondary | ICD-10-CM | POA: Insufficient documentation

## 2015-03-06 NOTE — Patient Instructions (Signed)
Smoking Cessation Quitting smoking is important to your health and has many advantages. However, it is not always easy to quit since nicotine is a very addictive drug. Oftentimes, people try 3 times or more before being able to quit. This document explains the best ways for you to prepare to quit smoking. Quitting takes hard work and a lot of effort, but you can do it. ADVANTAGES OF QUITTING SMOKING  You will live longer, feel better, and live better.  Your body will feel the impact of quitting smoking almost immediately.  Within 20 minutes, blood pressure decreases. Your pulse returns to its normal level.  After 8 hours, carbon monoxide levels in the blood return to normal. Your oxygen level increases.  After 24 hours, the chance of having a heart attack starts to decrease. Your breath, hair, and body stop smelling like smoke.  After 48 hours, damaged nerve endings begin to recover. Your sense of taste and smell improve.  After 72 hours, the body is virtually free of nicotine. Your bronchial tubes relax and breathing becomes easier.  After 2 to 12 weeks, lungs can hold more air. Exercise becomes easier and circulation improves.  The risk of having a heart attack, stroke, cancer, or lung disease is greatly reduced.  After 1 year, the risk of coronary heart disease is cut in half.  After 5 years, the risk of stroke falls to the same as a nonsmoker.  After 10 years, the risk of lung cancer is cut in half and the risk of other cancers decreases significantly.  After 15 years, the risk of coronary heart disease drops, usually to the level of a nonsmoker.  If you are pregnant, quitting smoking will improve your chances of having a healthy baby.  The people you live with, especially any children, will be healthier.  You will have extra money to spend on things other than cigarettes. QUESTIONS TO THINK ABOUT BEFORE ATTEMPTING TO QUIT You may want to talk about your answers with your  health care provider.  Why do you want to quit?  If you tried to quit in the past, what helped and what did not?  What will be the most difficult situations for you after you quit? How will you plan to handle them?  Who can help you through the tough times? Your family? Friends? A health care provider?  What pleasures do you get from smoking? What ways can you still get pleasure if you quit? Here are some questions to ask your health care provider:  How can you help me to be successful at quitting?  What medicine do you think would be best for me and how should I take it?  What should I do if I need more help?  What is smoking withdrawal like? How can I get information on withdrawal? GET READY  Set a quit date.  Change your environment by getting rid of all cigarettes, ashtrays, matches, and lighters in your home, car, or work. Do not let people smoke in your home.  Review your past attempts to quit. Think about what worked and what did not. GET SUPPORT AND ENCOURAGEMENT You have a better chance of being successful if you have help. You can get support in many ways.  Tell your family, friends, and coworkers that you are going to quit and need their support. Ask them not to smoke around you.  Get individual, group, or telephone counseling and support. Programs are available at local hospitals and health centers. Call   your local health department for information about programs in your area.  Spiritual beliefs and practices may help some smokers quit.  Download a "quit meter" on your computer to keep track of quit statistics, such as how long you have gone without smoking, cigarettes not smoked, and money saved.  Get a self-help book about quitting smoking and staying off tobacco. LEARN NEW SKILLS AND BEHAVIORS  Distract yourself from urges to smoke. Talk to someone, go for a walk, or occupy your time with a task.  Change your normal routine. Take a different route to work.  Drink tea instead of coffee. Eat breakfast in a different place.  Reduce your stress. Take a hot bath, exercise, or read a book.  Plan something enjoyable to do every day. Reward yourself for not smoking.  Explore interactive web-based programs that specialize in helping you quit. GET MEDICINE AND USE IT CORRECTLY Medicines can help you stop smoking and decrease the urge to smoke. Combining medicine with the above behavioral methods and support can greatly increase your chances of successfully quitting smoking.  Nicotine replacement therapy helps deliver nicotine to your body without the negative effects and risks of smoking. Nicotine replacement therapy includes nicotine gum, lozenges, inhalers, nasal sprays, and skin patches. Some may be available over-the-counter and others require a prescription.  Antidepressant medicine helps people abstain from smoking, but how this works is unknown. This medicine is available by prescription.  Nicotinic receptor partial agonist medicine simulates the effect of nicotine in your brain. This medicine is available by prescription. Ask your health care provider for advice about which medicines to use and how to use them based on your health history. Your health care provider will tell you what side effects to look out for if you choose to be on a medicine or therapy. Carefully read the information on the package. Do not use any other product containing nicotine while using a nicotine replacement product.  RELAPSE OR DIFFICULT SITUATIONS Most relapses occur within the first 3 months after quitting. Do not be discouraged if you start smoking again. Remember, most people try several times before finally quitting. You may have symptoms of withdrawal because your body is used to nicotine. You may crave cigarettes, be irritable, feel very hungry, cough often, get headaches, or have difficulty concentrating. The withdrawal symptoms are only temporary. They are strongest  when you first quit, but they will go away within 10-14 days. To reduce the chances of relapse, try to:  Avoid drinking alcohol. Drinking lowers your chances of successfully quitting.  Reduce the amount of caffeine you consume. Once you quit smoking, the amount of caffeine in your body increases and can give you symptoms, such as a rapid heartbeat, sweating, and anxiety.  Avoid smokers because they can make you want to smoke.  Do not let weight gain distract you. Many smokers will gain weight when they quit, usually less than 10 pounds. Eat a healthy diet and stay active. You can always lose the weight gained after you quit.  Find ways to improve your mood other than smoking. FOR MORE INFORMATION  www.smokefree.gov  Document Released: 08/03/2001 Document Revised: 12/24/2013 Document Reviewed: 11/18/2011 ExitCare Patient Information 2015 [employer]. This information is not intended to replace advice given to you by your health care provider. Make sure you discuss any questions you have with your health care provider.  

## 2015-03-06 NOTE — Progress Notes (Signed)
Patient ID: Dawn Allen, female   DOB: June 16, 1951, 64 y.o.   MRN: [id number]  Chief Complaint  Patient presents with  . New Evaluation    lung nodule   Referred By Dr. Brunetta Genera Reason for Referral left lung nodule  HPI Location, Quality, Duration, Severity, Timing, Context, Modifying Factors, Associated Signs and Symptoms.  Dawn Allen is a 64 y.o. female.  This patient is a 64 year old African-American female who is referred to me after a short stay in the hospital earlier this week. She states that she was diagnosed as having a tooth abscess which required intravenous antibiotics and a short hospital stay. During that evaluation she was found to have an enlarging left upper lobe mass. Previously in December 2015 she had a CT scan done revealing 2 small lesions in the left upper lobe each measuring about 6 mm. A repeat CT scan performed yesterday showed that the 2 lesions have now coalesced and the lesions now measure about 1.6 cm. She states that she has no prior history of any lung cancers. With regard to her tooth abscess she is scheduled to follow-up at the dental school at [facility]. She states she does not want to follow-up with anybody here because of some concerns about anesthesia. Of note is that the patient has had a prior deep venous thrombosis in both right and left legs on separate occasions and has a inferior vena caval filter in place. She takes several toe for that. She does have a history of smoking and continues to smoke at least a half a pack cigarettes a day. She has smoked all her life. In addition she is diabetic and requires insulin when she was in the hospital but is currently taking oral hypoglycemics. She does get somewhat short of breath whenever she exerts herself. She feels that this is secondary to deconditioning as well as her underlying lung problems. She carries a diagnosis of a prior myocardial infarction in late 2015. She does have a stent in place. She has no overt  symptoms related to her lung nodule. It's unclear how long this has been there and. There are no prior scans before 2015.   Past Medical History  Diagnosis Date  . Hypertension   . DVT (deep venous thrombosis)     LEFT LEG   . MI (myocardial infarction)   . Diabetes mellitus without complication   . Seizure disorder   . Coronary artery disease     a. NSTEMI cath 08/02/14: LM nl, pLAD 50%, mLCx 30%, mRCA 95% s/p PCI/DES, 2nd lesion 40%, EF 55%  . HLD (hyperlipidemia)   . Lung nodule     Past Surgical History  Procedure Laterality Date  . Abdominal hysterectomy      PARTIAL   . Leg surgery Right     BLOOD CLOT  . Cardiac catheterization  08/02/2014  . Coronary angioplasty  08/02/2014    drug eluting stent placement    Family History  Problem Relation Age of Onset  . Heart attack Mother     Social History History  Substance Use Topics  . Smoking status: Current Every Day Smoker -- 1.00 packs/day for 14 years    Types: Cigarettes  . Smokeless tobacco: Never Used  . Alcohol Use: No    Allergies  Allergen Reactions  . Penicillins     Current Outpatient Prescriptions  Medication Sig Dispense Refill  . atorvastatin (LIPITOR) 40 MG tablet Take 40 mg by mouth daily.    . clindamycin (  CLEOCIN) 300 MG capsule Take 1 capsule (300 mg total) by mouth every 8 (eight) hours. 21 capsule 0  . clopidogrel (PLAVIX) 75 MG tablet Take 75 mg by mouth daily.    . diazepam (VALIUM) 5 MG tablet Take 1 tablet by mouth 2 (two) times daily as needed.    . empagliflozin (JARDIANCE) 25 MG TABS tablet Take 25 mg by mouth daily.    Marland Kitchen HYDROcodone-acetaminophen (NORCO/VICODIN) 5-325 MG per tablet Take 1 tablet by mouth every 8 (eight) hours as needed for moderate pain. 30 tablet 0  . LINZESS 290 MCG CAPS capsule Take 1 capsule by mouth daily.    Marland Kitchen lisinopril-hydrochlorothiazide (PRINZIDE,ZESTORETIC) 20-25 MG per tablet Take 1 tablet by mouth daily.     . metFORMIN (GLUCOPHAGE) 1000 MG tablet  Take 1,000 mg by mouth 2 (two) times daily with a meal.    . metoprolol tartrate (LOPRESSOR) 25 MG tablet Take 25 mg by mouth 2 (two) times daily.    . nitroGLYCERIN (NITROSTAT) 0.4 MG SL tablet Place 0.4 mg under the tongue every 5 (five) minutes as needed for chest pain.    Marland Kitchen PARoxetine (PAXIL) 20 MG tablet Take 1 tablet by mouth daily.    . phenytoin (DILANTIN) 100 MG ER capsule Take 300 mg by mouth daily.     . pioglitazone (ACTOS) 45 MG tablet Take 1 tablet by mouth daily.    . Saxagliptin-Metformin (KOMBIGLYZE XR) 2.12-998 MG TB24 Take 2 tablets by mouth daily.     . Vitamin D, Ergocalciferol, (DRISDOL) 50000 UNITS CAPS capsule Take 50,000 Units by mouth every 7 (seven) days.    Alveda Reasons 10 MG TABS tablet Take 1 tablet by mouth every evening.     No current facility-administered medications for this visit.      Review of Systems A complete review of systems was asked and was negative except for the following positive findings a 20 pound weight loss over the last 6 months, fever chills night sweats related to her tooth abscess, loss of appetite, occasional cough, joint pain, depression, excessive thirst, easy bruising.  Blood pressure 131/80, pulse 70, temperature 98 F (36.7 C), temperature source Oral, resp. rate 18, height '5\' 5"'$  (1.651 m), weight 204 lb (92.534 kg), SpO2 98 %.  Physical Exam CONSTITUTIONAL:  Pleasant, well-developed, well-nourished, and in no acute distress. EYES: Pupils equal and reactive to light, Sclera non-icteric EARS, NOSE, MOUTH AND THROAT:  The oropharynx was clear.  Dentition is good repair.  Oral mucosa pink and moist. LYMPH NODES:  Lymph nodes in the neck and axillae were normal RESPIRATORY:  Lungs were clear.  Normal respiratory effort without pathologic use of accessory muscles of respiration CARDIOVASCULAR: Heart was regular without murmurs.  There were no carotid bruits. GI: The abdomen was soft, nontender, and nondistended. There were no palpable  masses. There was no hepatosplenomegaly. There were normal bowel sounds in all quadrants. GU:  Rectal deferred.   MUSCULOSKELETAL:  Normal muscle strength and tone.  No clubbing or cyanosis.   SKIN:  There were no pathologic skin lesions.  There were no nodules on palpation. NEUROLOGIC:  Sensation is normal.  Cranial nerves are grossly intact. PSYCH:  Oriented to person, place and time.  Mood and affect are normal.  Data Reviewed I have independently reviewed the patient's CT scan from yesterday in compared to the scan from before. Reveals an enlarging left upper lobe mass  I have personally reviewed the patient's imaging, laboratory findings and medical records.  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 She also has other comorbid conditions including morbid obesity, myocardial infarction, coronary artery disease, tobacco abuse, diabetes mellitus and hypertension.    Plan    I told the patient I believe that she needs to have her tooth addressed first. Would also like to obtain a PET scan as well as a complete set of pulmonary function studies. I would like to see her back again in 2 weeks' time. We'll also set her up to see one of our cardiologists to see if any additional workup is required prior to any surgical intervention. We understand that she is on Xarelto and that will need to be adjusted. I will see her back in 2 weeks for continuing consultation regarding her left upper lobe mass. We also counseled her on smoking cessation today.       Nestor Lewandowsky, MD 03/06/2015, 4:30 PM

## 2015-03-06 NOTE — Progress Notes (Signed)
   03/06/15 1439  Clinical Encounter Type  Visited With Patient  Visit Type Initial  Spiritual Encounters  Spiritual Needs Emotional  Stress Factors  Patient Stress Factors Health changes;Lack of knowledge  Pastoral care and support to patient in the cancer center.  Pt said she is scared of what the doctor is going to tell her and uncertain of what is ahead.  Provided compassion and support.  Arrie Senate Reyansh Kushnir-pager [phone]

## 2015-03-06 NOTE — Addendum Note (Signed)
Addended by: Louis Matte on: 03/06/2015 04:58 PM   Modules accepted: Orders

## 2015-03-07 LAB — EHRLICHIA ANTIBODY PANEL
E CHAFFEENSIS AB, IGG: NEGATIVE
E CHAFFEENSIS AB, IGM: NEGATIVE
E. CHAFFEENSIS IGG AB: NEGATIVE
E. Chaffeensis (HME) IgM Titer: NEGATIVE

## 2015-03-07 LAB — CULTURE, BLOOD (ROUTINE X 2)
Culture: NO GROWTH
Culture: NO GROWTH

## 2015-03-07 LAB — B. BURGDORFI ANTIBODIES: B burgdorferi Ab IgG+IgM: <0.91 {ISR} (ref 0.00–0.90)

## 2015-03-07 LAB — ROCKY MTN SPOTTED FVR ABS PNL(IGG+IGM)
RMSF IgG: NEGATIVE
RMSF IgM: 0.23 index (ref 0.00–0.89)

## 2015-03-07 NOTE — Progress Notes (Signed)
  Oncology Nurse Navigator Documentation    Navigator Encounter Type: Clinic/MDC (03/07/15 0700)     Barriers/Navigation Needs: Transportation;Financial (03/07/15 0700)         Met with patient at initial thoracic surgery consultation. Reviewed navigation program and plan of care. Patient is visibly very anxious and reports feeling overwhelmed. Will follow closely and assist in coordination of plan of care to include PFT's, PET scan, cardiac evaluation, and evaluation of need for/ alternatives to xarelto. Patient appears reassured from interventions.

## 2015-03-11 ENCOUNTER — Ambulatory Visit: Payer: Medicare Other | Admitting: Cardiovascular Disease

## 2015-03-11 ENCOUNTER — Other Ambulatory Visit: Payer: Self-pay | Admitting: Cardiothoracic Surgery

## 2015-03-11 ENCOUNTER — Ambulatory Visit: Payer: Medicare Other | Attending: Cardiothoracic Surgery

## 2015-03-11 ENCOUNTER — Telehealth: Payer: Self-pay | Admitting: *Deleted

## 2015-03-11 DIAGNOSIS — R918 Other nonspecific abnormal finding of lung field: Secondary | ICD-10-CM | POA: Diagnosis not present

## 2015-03-11 LAB — BLOOD GAS, ARTERIAL
Acid-Base Excess: 1 mmol/L (ref 0.0–3.0)
Allens test (pass/fail): POSITIVE — AB
Bicarbonate: 25.2 mEq/L (ref 21.0–28.0)
FIO2: 21 %
O2 Saturation: 96.8 %
Patient temperature: 37
pCO2 arterial: 38 mmHg (ref 32.0–48.0)
pH, Arterial: 7.43 (ref 7.350–7.450)
pO2, Arterial: 86 mmHg (ref 83.0–108.0)

## 2015-03-11 MED ORDER — ALBUTEROL SULFATE (2.5 MG/3ML) 0.083% IN NEBU
2.5000 mg | INHALATION_SOLUTION | Freq: Once | RESPIRATORY_TRACT | Status: AC
  Filled 2015-03-11: qty 3

## 2015-03-12 ENCOUNTER — Ambulatory Visit: Payer: Medicare Other | Admitting: Cardiovascular Disease

## 2015-03-12 ENCOUNTER — Telehealth: Payer: Self-pay | Admitting: *Deleted

## 2015-03-12 NOTE — Telephone Encounter (Signed)
Patient called clinical hotline for Dr. Genevive Bi. Call answered immediately by RN. patient introduced herself as "This is Dawn Allen need to talk to Dr. Genevive Bi" Allen asked patient for her last name and her birthday to verify correct chart/patient. Patient refused to answer RN and stated "If you don't want to talk to me then fine. Allen don't have to talk to you." She hung up. Allen attempted to call patient back. Pt did not answer. Allen left a voice mail message to reach back to the patient. Allen explained via the voice mail that Allen was trying to help her and Allen would like the patient call the cancer center back so that we can answer her questions or concerns.  Allen communicated in verbal hand off to Burgess Estelle, RN nurse navigation regarding patient's call. He will attempt to reach out to the patient this afternoon.

## 2015-03-13 ENCOUNTER — Inpatient Hospital Stay: Payer: Medicare Other

## 2015-03-13 ENCOUNTER — Encounter: Admission: RE | Admit: 2015-03-13 | Payer: Medicare Other | Source: Ambulatory Visit

## 2015-03-14 NOTE — Progress Notes (Signed)
PSN called patient to discuss financial resources that may be available to her.  Patient reported that she was treated "like a dog" by an employee of the cancer center.  She stated that she did not want to talk right now.  PSN apologized for the treatment she perceived as receiving.  Additionally, PSN asked if we could make it up to here.    Patient hung up before anything else could be said.  Patient called back while typing this note.  She called to apologize.  PSN was very supportive of patient, and asked if she would allow RN navigator to call her to set her appointments back up..  She agreed to have him call Monday.  RN navigator notified to give her a call on Monday.  PSN called a food card in to [employer] for patient.

## 2015-03-17 ENCOUNTER — Ambulatory Visit: Payer: Medicare Other | Admitting: Cardiovascular Disease

## 2015-03-17 ENCOUNTER — Telehealth: Payer: Self-pay | Admitting: *Deleted

## 2015-03-17 NOTE — Telephone Encounter (Signed)
[name] Driver called patient to let her know that he was on his way to pick her up the for the cardiac appointment. Pt told VanDriver that she is not going to the heart doctor today said she didn't know about appointment.  She also told the VanDriver that she "was not sure about going to the PET scan tomorrow."  Per Elease Etienne, Raquel Sarna, RN will

## 2015-03-17 NOTE — Telephone Encounter (Signed)
  Oncology Nurse Navigator Documentation    Navigator Encounter Type: Telephone (03/17/15 1000)               Discussed needed appointments with patient. Patient doesn't remember knowing about appointments but is willing to have pet scan tomorrow. In reviewing restrictions with patient, patient verbalizes that blood sugar is always over 200. Patient is taking oral medications for diabetes and doesn't know what else to do.   Contacted patient's primary care, Dr. Bernita Buffy, to discuss above. He reports patient is noncompliant and will not consider injectable insulins, and has maxed out effectiveness of oral hypoglycemic medications. After reviewing recent glucose labs while patient was admitted, recommendation is for dietary management today and tomorrow and if patient is agreeable to injectable insulin Dr. Bernita Buffy is happy to assist.   Left voicemail with patient to call back.

## 2015-03-17 NOTE — Telephone Encounter (Signed)
  Oncology Nurse Navigator Documentation    Navigator Encounter Type: Telephone (03/17/15 1100)               Patient called back. Reviewed in detail PET scan instructions related to dietary restrictions and for patient to check blood sugar frequently to ensure safety. Patient agrees to come to PET scan.  Also, reviewed information from Dr. Bernita Buffy and patient is agreeable to injectable insulin to manage diabetes if needed. Will follow.

## 2015-03-17 NOTE — Telephone Encounter (Signed)
Received voice message on clinical hotline from patient asking "why Dr. Genevive Bi ordered a pap smear" for her for tomorrow. Shawn to return patient's call.

## 2015-03-18 ENCOUNTER — Encounter
Admission: RE | Admit: 2015-03-18 | Discharge: 2015-03-18 | Disposition: A | Discharge status: Home / Self Care | Payer: Medicare Other | Source: Ambulatory Visit | Attending: Cardiothoracic Surgery | Admitting: Cardiothoracic Surgery

## 2015-03-18 ENCOUNTER — Encounter: Payer: Self-pay | Admitting: *Deleted

## 2015-03-18 ENCOUNTER — Inpatient Hospital Stay: Payer: Medicare Other

## 2015-03-18 DIAGNOSIS — R911 Solitary pulmonary nodule: Secondary | ICD-10-CM | POA: Insufficient documentation

## 2015-03-18 LAB — GLUCOSE, CAPILLARY: GLUCOSE-CAPILLARY: 163 mg/dL — AB (ref 65–99)

## 2015-03-18 MED ORDER — FLUDEOXYGLUCOSE F - 18 (FDG) INJECTION
14.2700 | Freq: Once | INTRAVENOUS | Status: AC | PRN
  Administered 2015-03-18: 14.27 via INTRAVENOUS

## 2015-03-18 NOTE — Progress Notes (Signed)
  Oncology Nurse Navigator Documentation    Navigator Encounter Type: Other (03/18/15 0900)     Barriers/Navigation Needs: Transportation;Financial;Family concerns (03/18/15 0900)         Met patient in medical mall and escorted to PET scan waiting room. While waiting with patient reviewed importance of PET scan and f/u with Dr. Genevive Bi Thursday. Will assist in ongoing treatment planning.

## 2015-03-20 ENCOUNTER — Inpatient Hospital Stay: Payer: Medicare Other

## 2015-03-20 ENCOUNTER — Encounter: Payer: Self-pay | Admitting: *Deleted

## 2015-03-20 ENCOUNTER — Inpatient Hospital Stay (HOSPITAL_BASED_OUTPATIENT_CLINIC_OR_DEPARTMENT_OTHER): Payer: Medicare Other | Admitting: Cardiothoracic Surgery

## 2015-03-20 ENCOUNTER — Telehealth: Payer: Self-pay | Admitting: *Deleted

## 2015-03-20 VITALS — BP 148/84 | HR 83 | Temp 100.0°F | Resp 20 | Wt 195.9 lb

## 2015-03-20 DIAGNOSIS — R918 Other nonspecific abnormal finding of lung field: Secondary | ICD-10-CM

## 2015-03-20 NOTE — Progress Notes (Signed)
Patient here for follow up visit.

## 2015-03-20 NOTE — Telephone Encounter (Signed)
1. Patient is s/p PCI/DES in 07/2014 -She cannot come of Plavix until at least 07/2015 -If absolutely necessary could possibly bridge with Aggrenox, though this is not optimal   2. 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

## 2015-03-20 NOTE — Progress Notes (Signed)
Dawn Allen Inpatient Post-Op Note  Patient ID: Dawn Allen, female   DOB: 11-20-1950, 64 y.o.   MRN: [id number]  HISTORY: This patient returns today in follow-up. She did not get an opportunity to see the cardiologist or the dentists last week. However she did have her pulmonary function studies done and she had her PET scan performed. She states that she's been doing reasonably well with any new problems. Unfortunately she continues to smoke. She is also on aspirin and Plavix.   Filed Vitals:   03/20/15 1008  BP: 148/84  Pulse: 83  Temp: 100 F (37.8 C)  Resp: 20     EXAM: Resp: Lungs are clear bilaterally.  No respiratory distress, normal effort. Heart:  Regular without murmurs  her neck is supple without thyromegaly, palpable nodularity or adenopathy  Neurological: Alert and oriented to person, place, and time. Coordination normal.  Skin: Skin is warm and dry. No rash noted. No diaphoretic. No erythema. No pallor.  Psychiatric: Normal mood and affect. Normal behavior. Judgment and thought content normal.    ASSESSMENT: I have independently reviewed the patient's CT scan and PET scan. Of also reviewed her pulmonary function studies. Her DLCO is only 50% of predicted and her FEV1 is approximately 80% of predicted. Her PET scan shows intense uptake in the right and left thyroid lobes as well as the left upper lobe. There is no other evidence of distant disease.   PLAN:   I had a long discussion with the patient today regarding the options. I told her I would like her to see the cardiologist and dentists as well as our pulmonologist to see if they can improve her overall pulmonary function. I also told her that she should try to avoid tobacco use. She will come back to see me again in one week so that we can keep the flow of evaluation ongoing. Before any biopsies can be performed she must be off her aspirin and Plavix.    Nestor Lewandowsky, MD

## 2015-03-20 NOTE — Telephone Encounter (Signed)
Dawn Allen with the [place] wants to know if she can come off the Xarelto and Plavix for a biopsy of lung and thyroid. Possible surgery in the future.

## 2015-03-21 ENCOUNTER — Telehealth: Payer: Self-pay | Admitting: *Deleted

## 2015-03-21 NOTE — Progress Notes (Signed)
  Oncology Nurse Navigator Documentation    Navigator Encounter Type: Clinic/MDC (03/21/15 0600)     Barriers/Navigation Needs: Transportation;Financial;Education (03/21/15 0600)         Met with patient at thoracic surgery follow up. Had lengthy 1:1 interaction with assistance of [name], Navigator, to address patients anxiety and inability to accept possibility of malignancy that is impacting process of care. Will continue to closely follow. Patient agreeable to appointments being set up for next Tuesday, the 2nd with cancer center [place] transportation. Will attempt to obtain cardiac clearance for biopsy with anticoagulant medication hold, face to face appt with patient services navigator for resource assessment and assistance, pulmonary consult to maximize borderline lung functions for consideration of future surgery, dental visit to address tooth decay prior to surgery, and follow up with Dr. Genevive Bi.

## 2015-03-21 NOTE — Telephone Encounter (Signed)
  Oncology Nurse Navigator Documentation    Navigator Encounter Type: Telephone (03/21/15 1500)               Notified patient of appt for Tuesday 03/25/15 with patient services navigator at [place] to pick up at 1:30pm. Patient agreeable to this plan.

## 2015-03-21 NOTE — Telephone Encounter (Signed)
Spoke w/ Raquel Sarna. Please see staff message that he sent you.

## 2015-03-21 NOTE — Telephone Encounter (Signed)
Contacted ordering provider of xarelto, Dr. Brunetta Genera, and discussed PET findings and need for CT guided biopsy. Dr. Brunetta Genera approves holding xarelto for this procedure as patient has IVC filter in place.

## 2015-03-23 NOTE — Telephone Encounter (Signed)
Spoke with interventional cardiology, Dr. Fletcher Anon, MD, who states given patient is now > 6 months from PCI without ACS she can come off Plavix without bridging. Please have patient hold Plavix 7 days prior to procedure and continue aspirin 81 mg throughout. Please have her resume Plavix as soon as possible post procedure per treatment MD.

## 2015-03-25 ENCOUNTER — Inpatient Hospital Stay: Payer: Medicare Other | Attending: Internal Medicine | Admitting: Internal Medicine

## 2015-03-25 ENCOUNTER — Telehealth: Payer: Self-pay | Admitting: *Deleted

## 2015-03-25 NOTE — Telephone Encounter (Signed)
[name] Driver contacted patient to let her know that he was on his way to pick patient up.  Patient notified Ronal Fear "I will not be keeping my appointment today in the cancer center. I am currently at the [employer] office in [place]."  Dr. Mortimer Fries was notified at 1445. Dr. Mortimer Fries suggests that a referral be initiated to [facility] pulmonology clinic.  I spoke with [name], RN, who will call the patient coordinated any necessary referrals. Dr. Genevive Bi notified as well.

## 2015-03-25 NOTE — Progress Notes (Signed)
  Oncology Nurse Navigator Documentation    Navigator Encounter Type: Other (03/25/15 1500)               Notified that Ms Mcdougle was unable to keep her appt today. Per recommendation of Dr Mortimer Fries .I will attempt to arrange an appt with [place] for possible navigational bronch for biopsy with approval from Ms Eubanks. Dr Fletcher Anon is in this office as well and can consult with pulmonology regarding staying on asa. Will contact Ms Aden 8/3

## 2015-03-26 ENCOUNTER — Telehealth: Payer: Self-pay

## 2015-03-26 NOTE — Telephone Encounter (Signed)
  Oncology Nurse Navigator Documentation    Navigator Encounter Type: Telephone (03/26/15 1300)               Voicemail left for Ms Suttles to return call--Needs to potentially have CT guided biopsy arranged

## 2015-03-31 ENCOUNTER — Telehealth: Payer: Self-pay | Admitting: *Deleted

## 2015-03-31 ENCOUNTER — Telehealth: Payer: Self-pay

## 2015-03-31 NOTE — Telephone Encounter (Signed)
1545~ I spoke with Amber in Ultrasound and was informed that pt's thyroid biopsy is scheduled for 04/03/15 at 2:30 pm. Pt is to arrive at 1:45 pm. I informed Amber that pt is on Metformin and Xarelto. She then asked me for Ms. Albertsen's telephone number; which I gave to her.  This note to be forwarded to [facility] and Dr. Genevive Bi.Marland KitchenMarland Kitchen

## 2015-03-31 NOTE — Telephone Encounter (Signed)
  Oncology Nurse Navigator Documentation    Navigator Encounter Type: Telephone (03/31/15 1100)               Attempted to call and arrange for biopsy. No voicemail available

## 2015-04-02 ENCOUNTER — Other Ambulatory Visit: Payer: Self-pay | Admitting: *Deleted

## 2015-04-02 ENCOUNTER — Telehealth: Payer: Self-pay | Admitting: *Deleted

## 2015-04-02 DIAGNOSIS — E041 Nontoxic single thyroid nodule: Secondary | ICD-10-CM

## 2015-04-02 NOTE — Progress Notes (Signed)
  Oncology Nurse Navigator Documentation    Navigator Encounter Type: Other;Written/Letter (04/02/15 1100)    Letter mailed to home address.           04/02/15  Good afternoon Dawn Allen,  I have been unable to reach you by telephone. I wanted to reach out to you to see how you were doing since the last time I got to meet with you. I saw where you were unable to make your appointment with [facility] and Dr Mortimer Fries. If you would like for me to get you rescheduled with [facility], let me know and I can arrange that and transportation for you. I also wanted to offer my support to you regarding your decision on whether to proceed with having a lung and thyroid biopsy. If you would like to talk to me further regarding having the biopsy done, please call me. If you have any other needs please feel free to call me as well.  Yours Sincerely,   [name] RN  [phone]  Raquel Sarna will be back in the office after August 22nd , and will be here for all your needs as well.

## 2015-04-02 NOTE — Telephone Encounter (Signed)
I tried to reach pt via text message without success. Dawn Allen in [place] (ext [phone]) notified..pt is to be rescheduled... For future reference she cannot take Xarelto the day before her procedure; but CAN take all other meds the day of.Marland KitchenMarland KitchenWill need to be NPO after MN.Marland KitchenMarland Kitchen

## 2015-04-03 ENCOUNTER — Ambulatory Visit: Payer: Medicare Other

## 2015-04-07 NOTE — Telephone Encounter (Signed)
On 04/03/15 pt called and stated that she would come in and see Dr. Genevive Bi one more time before proceeding with her thyroid biopsy and lung biopsy. She is afraid and will need reassurance. Dr. Genevive Bi was agreeable to this. Appt. Scheduled.Marland KitchenMarland Kitchen

## 2015-04-10 ENCOUNTER — Encounter: Payer: Self-pay | Admitting: *Deleted

## 2015-04-10 ENCOUNTER — Inpatient Hospital Stay: Payer: Medicare Other | Attending: Cardiothoracic Surgery | Admitting: Cardiothoracic Surgery

## 2015-04-10 VITALS — BP 123/81 | HR 96 | Temp 97.6°F | Wt 195.8 lb

## 2015-04-10 DIAGNOSIS — E079 Disorder of thyroid, unspecified: Secondary | ICD-10-CM | POA: Diagnosis not present

## 2015-04-10 DIAGNOSIS — R918 Other nonspecific abnormal finding of lung field: Secondary | ICD-10-CM | POA: Insufficient documentation

## 2015-04-10 NOTE — Addendum Note (Signed)
Addended by: Louis Matte on: 04/10/2015 01:39 PM   Modules accepted: Orders

## 2015-04-10 NOTE — Progress Notes (Unsigned)
PSN met with patient to discuss financial concerns. Patient accepted assistance with groceries. Offered additional resources/services with other areas as well, patient declined at this time. She agreed to contact us as needed.

## 2015-04-10 NOTE — Progress Notes (Signed)
Dawn Allen Inpatient Post-Op Note  Patient ID: Dawn Allen, female   DOB: 1951-04-16, 64 y.o.   MRN: [id number]  HISTORY: This patient returns today in follow-up. She had been scheduled for a biopsy of her thyroid and lung lesions but did not keep that appointment. She continues to remain very anxious and nervous about potential diagnosis. I explained to her that without a biopsy we could not institute any type of treatment plan. She understood that. She does not complain of any shortness of breath at this time. She's had no fevers or chills.   Filed Vitals:   04/10/15 0919  BP: 123/81  Pulse: 96  Temp: 97.6 F (36.4 C)     EXAM: Resp: Lungs are clear bilaterally.  No respiratory distress, normal effort. Heart:  Regular without murmurs Abd:  Abdomen is soft, non distended and non tender. No masses are palpable.  There is no rebound and no guarding.  Neurological: Alert and oriented to person, place, and time. Coordination normal.  Skin: Skin is warm and dry. No rash noted. No diaphoretic. No erythema. No pallor.  Psychiatric: Normal mood and affect. Normal behavior. Judgment and thought content normal.    ASSESSMENT: I had a long discussion with her today regarding the potential need for a biopsy of her thyroid and her left upper lobe mass. I did discuss her care with Dr. Phoebe Perch at [facility] surgical. He has recommended a biopsy of both lobes of the thyroid. I discussed that with the patient and she is agreeable to proceeding. She does take Eliquis and we have permission to stop that. We'll go ahead and set her up for a lung and thyroid biopsy and stop her Eliquis several days prior. I will see her back again in follow-up once the biopsy results are available.   PLAN:   I will see the patient back in follow-up once the results of her thyroid and lung biopsies are available. All of her questions were answered. She will also meet with our social worker for her continued  needs.    Nestor Lewandowsky, MD

## 2015-04-10 NOTE — Progress Notes (Signed)
  Oncology Nurse Navigator Documentation    Navigator Encounter Type: Clinic/MDC (04/10/15 1000)     Barriers/Navigation Needs: Financial;Transportation (04/10/15 1000)         Met with patient at length at follow up appointment with Dr. Genevive Bi. Patient is agreeable to proceed with plan for biopsy of both thyroid and lung abnormality. With assistance from [place] in special procedures and [place] in scheduling, patient is given instructions to stop xarelto after 8/28 dose, van to pick up on Tuesday 04/22/15 at 11:45am, does not need to be npo for this biopsy. Also given appt. For Wednesday 04/23/15 7:30 van pick up. Will need to be npo for this biopsy and have someone at home. [place] is aware that patient will be transported to and from hospital by cancer center [name] and will make arrangements for a friend to be with her at home.   Also coordinated patient services navigator visit while here in clinic today to assist with financial issues as well as giving some food from our food pantry supply. Will follow closely.

## 2015-04-18 ENCOUNTER — Ambulatory Visit: Payer: Medicare Other | Admitting: Cardiovascular Disease

## 2015-04-21 ENCOUNTER — Other Ambulatory Visit: Payer: Self-pay | Admitting: Radiology

## 2015-04-21 NOTE — Telephone Encounter (Signed)
error 

## 2015-04-22 ENCOUNTER — Other Ambulatory Visit: Payer: Self-pay | Admitting: Radiology

## 2015-04-22 ENCOUNTER — Ambulatory Visit: Admission: RE | Admit: 2015-04-22 | Payer: Medicare Other | Source: Ambulatory Visit

## 2015-04-23 ENCOUNTER — Ambulatory Visit
Admission: RE | Admit: 2015-04-23 | Discharge: 2015-04-23 | Disposition: A | Discharge status: Home / Self Care | Payer: Medicare Other | Source: Ambulatory Visit | Attending: Cardiothoracic Surgery | Admitting: Cardiothoracic Surgery

## 2015-04-23 ENCOUNTER — Ambulatory Visit
Admission: RE | Admit: 2015-04-23 | Discharge: 2015-04-23 | Disposition: A | Discharge status: Home / Self Care | Payer: Medicare Other | Source: Ambulatory Visit | Attending: Interventional Radiology | Admitting: Interventional Radiology

## 2015-04-23 DIAGNOSIS — M722 Plantar fascial fibromatosis: Secondary | ICD-10-CM | POA: Diagnosis not present

## 2015-04-23 DIAGNOSIS — R918 Other nonspecific abnormal finding of lung field: Secondary | ICD-10-CM

## 2015-04-23 DIAGNOSIS — Z9889 Other specified postprocedural states: Secondary | ICD-10-CM | POA: Diagnosis present

## 2015-04-23 DIAGNOSIS — C3412 Malignant neoplasm of upper lobe, left bronchus or lung: Secondary | ICD-10-CM | POA: Insufficient documentation

## 2015-04-23 DIAGNOSIS — D72829 Elevated white blood cell count, unspecified: Secondary | ICD-10-CM | POA: Diagnosis not present

## 2015-04-23 DIAGNOSIS — A419 Sepsis, unspecified organism: Secondary | ICD-10-CM | POA: Insufficient documentation

## 2015-04-23 DIAGNOSIS — E785 Hyperlipidemia, unspecified: Secondary | ICD-10-CM | POA: Diagnosis not present

## 2015-04-23 DIAGNOSIS — I82409 Acute embolism and thrombosis of unspecified deep veins of unspecified lower extremity: Secondary | ICD-10-CM | POA: Insufficient documentation

## 2015-04-23 DIAGNOSIS — E876 Hypokalemia: Secondary | ICD-10-CM | POA: Diagnosis not present

## 2015-04-23 DIAGNOSIS — I2510 Atherosclerotic heart disease of native coronary artery without angina pectoris: Secondary | ICD-10-CM | POA: Diagnosis not present

## 2015-04-23 DIAGNOSIS — M2040 Other hammer toe(s) (acquired), unspecified foot: Secondary | ICD-10-CM | POA: Diagnosis not present

## 2015-04-23 DIAGNOSIS — I10 Essential (primary) hypertension: Secondary | ICD-10-CM | POA: Insufficient documentation

## 2015-04-23 DIAGNOSIS — M7660 Achilles tendinitis, unspecified leg: Secondary | ICD-10-CM | POA: Diagnosis not present

## 2015-04-23 DIAGNOSIS — C3490 Malignant neoplasm of unspecified part of unspecified bronchus or lung: Secondary | ICD-10-CM

## 2015-04-23 HISTORY — DX: Anxiety disorder, unspecified: F41.9

## 2015-04-23 HISTORY — DX: Unspecified convulsions: R56.9

## 2015-04-23 HISTORY — DX: Malignant neoplasm of unspecified part of unspecified bronchus or lung: C34.90

## 2015-04-23 LAB — CBC
HCT: 41.4 % (ref 35.0–47.0)
Hemoglobin: 13.4 g/dL (ref 12.0–16.0)
MCH: 26.9 pg (ref 26.0–34.0)
MCHC: 32.4 g/dL (ref 32.0–36.0)
MCV: 83 fL (ref 80.0–100.0)
PLATELETS: 171 10*3/uL (ref 150–440)
RBC: 4.99 MIL/uL (ref 3.80–5.20)
RDW: 17 % — ABNORMAL HIGH (ref 11.5–14.5)
WBC: 7.1 10*3/uL (ref 3.6–11.0)

## 2015-04-23 LAB — GLUCOSE, CAPILLARY: Glucose-Capillary: 166 mg/dL — ABNORMAL HIGH (ref 65–99)

## 2015-04-23 LAB — PROTIME-INR
INR: 1.01
PROTHROMBIN TIME: 13.5 s (ref 11.4–15.0)

## 2015-04-23 LAB — APTT: aPTT: 25 seconds (ref 24–36)

## 2015-04-23 MED ORDER — FENTANYL CITRATE (PF) 100 MCG/2ML IJ SOLN
INTRAMUSCULAR | Status: AC | PRN
  Administered 2015-04-23 (×3): 50 ug via INTRAVENOUS

## 2015-04-23 MED ORDER — MIDAZOLAM HCL 2 MG/2ML IJ SOLN
INTRAMUSCULAR | Status: AC | PRN
  Administered 2015-04-23 (×4): 1 mg via INTRAVENOUS

## 2015-04-23 MED ORDER — SODIUM CHLORIDE 0.9 % IV SOLN
Freq: Once | INTRAVENOUS | Status: AC
  Administered 2015-04-23: 09:00:00 via INTRAVENOUS

## 2015-04-23 MED ORDER — HYDROCODONE-ACETAMINOPHEN 5-325 MG PO TABS
ORAL_TABLET | ORAL | Status: AC
  Filled 2015-04-23: qty 1

## 2015-04-23 MED ORDER — HYDROCODONE-ACETAMINOPHEN 5-325 MG PO TABS
1.0000 | ORAL_TABLET | ORAL | Status: DC | PRN
  Administered 2015-04-23: 1 via ORAL

## 2015-04-23 MED ORDER — FENTANYL CITRATE (PF) 100 MCG/2ML IJ SOLN
INTRAMUSCULAR | Status: AC
  Filled 2015-04-23: qty 2

## 2015-04-23 MED ORDER — MIDAZOLAM HCL 5 MG/5ML IJ SOLN
INTRAMUSCULAR | Status: AC
  Filled 2015-04-23: qty 5

## 2015-04-23 NOTE — Procedures (Signed)
CT core 18g x2 LUL nodule to surg path No complication No blood loss. See complete dictation in [facility].

## 2015-04-23 NOTE — Discharge Instructions (Signed)
Needle Biopsy of Lung, Care After °Refer to this sheet in the next few weeks. These instructions provide you with information on caring for yourself after your procedure. Your health care provider may also give you more specific instructions. Your treatment has been planned according to current medical practices, but problems sometimes occur. Call your health care provider if you have any problems or questions after your procedure. °WHAT TO EXPECT AFTER THE PROCEDURE °· A bandage will be applied over the area where the needle was inserted. You may be asked to apply pressure to the bandage for several minutes to ensure there is minimal bleeding. °· In most cases, you can leave when your needle biopsy procedure is completed. Do not drive yourself home. Someone else should take you home. °· If you received an IV sedative or general anesthetic, you will be taken to a comfortable place to relax while the medicine wears off. °· If you have upcoming travel scheduled, talk to your health care provider about when it is safe to travel by air after the procedure. °HOME CARE INSTRUCTIONS °· Expect to take it easy for the rest of the day. °· Protect the area where you received the needle biopsy by keeping the bandage in place for as long as instructed. °· You may feel some mild pain or discomfort in the area, but this should stop in a day or two. °· Take medicines only as directed by your health care provider. °SEEK MEDICAL CARE IF:  °· You have pain at the biopsy site that worsens or is not helped by medicine. °· You have swelling or drainage at the needle biopsy site. °· You have a fever. °SEEK IMMEDIATE MEDICAL CARE IF:  °· You have new or worsening shortness of breath. °· You have chest pain. °· You are coughing up blood. °· You have bleeding that does not stop with pressure or a bandage. °· You develop light-headedness or fainting. °Document Released: 06/06/2007 Document Revised: 12/24/2013 Document Reviewed:  01/01/2013 °ExitCare® Patient Information ©2015 [employer]. This information is not intended to replace advice given to you by your health care provider. Make sure you discuss any questions you have with your health care provider. ° °

## 2015-04-25 ENCOUNTER — Other Ambulatory Visit: Payer: Self-pay | Admitting: Radiology

## 2015-04-25 LAB — SURGICAL PATHOLOGY

## 2015-04-29 ENCOUNTER — Ambulatory Visit
Admission: RE | Admit: 2015-04-29 | Discharge: 2015-04-29 | Disposition: A | Discharge status: Home / Self Care | Payer: Medicare Other | Source: Ambulatory Visit | Attending: Cardiothoracic Surgery | Admitting: Cardiothoracic Surgery

## 2015-04-29 DIAGNOSIS — E041 Nontoxic single thyroid nodule: Secondary | ICD-10-CM | POA: Diagnosis present

## 2015-04-29 MED ORDER — IBUPROFEN 200 MG PO TABS
800.0000 mg | ORAL_TABLET | Freq: Once | ORAL | Status: AC
  Administered 2015-04-29: 800 mg via ORAL
  Filled 2015-04-29 (×2): qty 4

## 2015-04-29 NOTE — Procedures (Signed)
Interventional Radiology Procedure Note  Procedure:  US guided bx of single right and single left thyroid nodule.  Both are FDG+ on prior PET CT.  Complications: None Recommendations:  - Ok to shower tomorrow - OTC analgesics - Routine care   Signed,  Dulcy Fanny. Earleen Newport, DO

## 2015-05-01 ENCOUNTER — Other Ambulatory Visit: Payer: Self-pay | Admitting: *Deleted

## 2015-05-01 DIAGNOSIS — C3490 Malignant neoplasm of unspecified part of unspecified bronchus or lung: Secondary | ICD-10-CM

## 2015-05-02 ENCOUNTER — Inpatient Hospital Stay: Payer: Medicare Other | Attending: Hematology and Oncology | Admitting: Hematology and Oncology

## 2015-05-02 ENCOUNTER — Ambulatory Visit
Admission: RE | Admit: 2015-05-02 | Discharge: 2015-05-02 | Disposition: A | Discharge status: Home / Self Care | Payer: Medicare Other | Source: Ambulatory Visit | Attending: Radiation Oncology | Admitting: Radiation Oncology

## 2015-05-02 ENCOUNTER — Encounter: Payer: Self-pay | Admitting: Radiation Oncology

## 2015-05-02 ENCOUNTER — Inpatient Hospital Stay (HOSPITAL_BASED_OUTPATIENT_CLINIC_OR_DEPARTMENT_OTHER): Payer: Medicare Other | Admitting: Cardiothoracic Surgery

## 2015-05-02 ENCOUNTER — Inpatient Hospital Stay: Payer: Medicare Other | Admitting: Cardiothoracic Surgery

## 2015-05-02 ENCOUNTER — Inpatient Hospital Stay: Payer: Medicare Other

## 2015-05-02 VITALS — BP 141/106 | HR 82 | Temp 98.6°F | Ht 65.0 in | Wt 200.2 lb

## 2015-05-02 DIAGNOSIS — I10 Essential (primary) hypertension: Secondary | ICD-10-CM | POA: Diagnosis not present

## 2015-05-02 DIAGNOSIS — F17210 Nicotine dependence, cigarettes, uncomplicated: Secondary | ICD-10-CM | POA: Diagnosis not present

## 2015-05-02 DIAGNOSIS — E041 Nontoxic single thyroid nodule: Secondary | ICD-10-CM

## 2015-05-02 DIAGNOSIS — E119 Type 2 diabetes mellitus without complications: Secondary | ICD-10-CM | POA: Insufficient documentation

## 2015-05-02 DIAGNOSIS — C3412 Malignant neoplasm of upper lobe, left bronchus or lung: Secondary | ICD-10-CM

## 2015-05-02 DIAGNOSIS — I2510 Atherosclerotic heart disease of native coronary artery without angina pectoris: Secondary | ICD-10-CM | POA: Insufficient documentation

## 2015-05-02 DIAGNOSIS — Z79899 Other long term (current) drug therapy: Secondary | ICD-10-CM | POA: Diagnosis not present

## 2015-05-02 DIAGNOSIS — F419 Anxiety disorder, unspecified: Secondary | ICD-10-CM | POA: Diagnosis not present

## 2015-05-02 DIAGNOSIS — I82509 Chronic embolism and thrombosis of unspecified deep veins of unspecified lower extremity: Secondary | ICD-10-CM | POA: Insufficient documentation

## 2015-05-02 DIAGNOSIS — Z955 Presence of coronary angioplasty implant and graft: Secondary | ICD-10-CM | POA: Insufficient documentation

## 2015-05-02 DIAGNOSIS — R911 Solitary pulmonary nodule: Secondary | ICD-10-CM

## 2015-05-02 DIAGNOSIS — I252 Old myocardial infarction: Secondary | ICD-10-CM

## 2015-05-02 DIAGNOSIS — R451 Restlessness and agitation: Secondary | ICD-10-CM | POA: Diagnosis not present

## 2015-05-02 DIAGNOSIS — C3492 Malignant neoplasm of unspecified part of left bronchus or lung: Secondary | ICD-10-CM

## 2015-05-02 DIAGNOSIS — Z7901 Long term (current) use of anticoagulants: Secondary | ICD-10-CM

## 2015-05-02 DIAGNOSIS — Z86718 Personal history of other venous thrombosis and embolism: Secondary | ICD-10-CM | POA: Insufficient documentation

## 2015-05-02 DIAGNOSIS — Z7902 Long term (current) use of antithrombotics/antiplatelets: Secondary | ICD-10-CM | POA: Insufficient documentation

## 2015-05-02 DIAGNOSIS — Z510 Encounter for antineoplastic radiation therapy: Secondary | ICD-10-CM | POA: Insufficient documentation

## 2015-05-02 LAB — CYTOLOGY - NON PAP

## 2015-05-02 NOTE — Progress Notes (Signed)
Patient here for follow up. Blood pressure extremely high. Patient took BP meds while in clinic and was advised to see her PCP as soon as possible regarding blood pressure control.

## 2015-05-02 NOTE — Consult Note (Signed)
Except an outstanding is perfect of Radiation Oncology NEW PATIENT EVALUATION  Name: Dawn Allen  MRN: [id number]  Date:   05/02/2015     DOB: Mar 05, 1951   This 64 y.o. female patient presents to the clinic for initial evaluation of stage I lung cancer for SB RT.  REFERRING PHYSICIAN: Lorelee Market, MD  CHIEF COMPLAINT: No chief complaint on file.   DIAGNOSIS: The encounter diagnosis was Malignant neoplasm of upper lobe of left lung.   PREVIOUS INVESTIGATIONS:  PET CT and CT scans reviewed Pathology reports reviewed Clinical notes reviewed Case presented at weekly tumor conference   HPI: Patient is a 64 year old female who was seen in the hospital for tooth abscess was noted on x-ray to have a left lung mass. Initially there were 2 separate lesions although they coalesced and on recent examination measure about 6 mm. Patient also has significant comorbidities including DVT with a filter placed coronary artery disease adult onset diabetes and prior MI. PET CT scan was performed showing hypermetabolic bilateral thyroid nodules as well as a 1.6 cm left upper lobe pulmonary nodule suspicious for primary bronchogenic carcinoma.  She underwent a CT-guided biopsy of the lung lesion which was positive for non-small cell lung carcinoma favoring squamous cell carcinoma. 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 Patient is asymptomatic from a pulmonary standpoint. She is been declined by surgical oncology for resection. She is seen today for consideration of radiation therapy. She is doing well specifically denies dysphagia cough mop phthisis or chest tightness.  PLANNED TREATMENT REGIMEN: SB RT of left upper lobe lesion.  PAST MEDICAL HISTORY:  has a past medical history of Hypertension; DVT (deep venous thrombosis); MI (myocardial infarction); Diabetes mellitus without complication; Seizure disorder; Coronary artery disease; HLD  (hyperlipidemia); Lung nodule; Anxiety; and Seizures.    PAST SURGICAL HISTORY:  Past Surgical History  Procedure Laterality Date  . Abdominal hysterectomy      PARTIAL   . Leg surgery Right     BLOOD CLOT  . Cardiac catheterization  08/02/2014  . Coronary angioplasty  08/02/2014    drug eluting stent placement  . Ivc filter placement ([facility] hx)      FAMILY HISTORY: family history includes Heart attack in her mother.  SOCIAL HISTORY:  reports that she has been smoking Cigarettes.  She has a 45 pack-year smoking history. She has never used smokeless tobacco. She reports that she does not drink alcohol or use illicit drugs.  ALLERGIES: Penicillins  MEDICATIONS:  Current Outpatient Prescriptions  Medication Sig Dispense Refill  . atorvastatin (LIPITOR) 40 MG tablet Take 40 mg by mouth daily.    . clindamycin (CLEOCIN) 300 MG capsule Take 1 capsule (300 mg total) by mouth every 8 (eight) hours. 21 capsule 0  . clopidogrel (PLAVIX) 75 MG tablet Take 75 mg by mouth daily.    . diazepam (VALIUM) 5 MG tablet Take 1 tablet by mouth 2 (two) times daily as needed.    . empagliflozin (JARDIANCE) 25 MG TABS tablet Take 25 mg by mouth daily.    Marland Kitchen HYDROcodone-acetaminophen (NORCO/VICODIN) 5-325 MG per tablet Take 1 tablet by mouth every 8 (eight) hours as needed for moderate pain. 30 tablet 0  . LINZESS 290 MCG CAPS capsule Take 1 capsule by mouth daily.    Marland Kitchen lisinopril-hydrochlorothiazide (PRINZIDE,ZESTORETIC) 20-25 MG per tablet Take 1 tablet by mouth daily.     . metFORMIN (GLUCOPHAGE) 1000 MG tablet Take 1,000 mg by mouth 2 (two)  times daily with a meal.    . metoprolol tartrate (LOPRESSOR) 25 MG tablet Take 25 mg by mouth 2 (two) times daily.    . nitroGLYCERIN (NITROSTAT) 0.4 MG SL tablet Place 0.4 mg under the tongue every 5 (five) minutes as needed for chest pain.    Marland Kitchen PARoxetine (PAXIL) 20 MG tablet Take 1 tablet by mouth daily.    . phenytoin (DILANTIN) 100 MG ER capsule Take 300 mg by  mouth daily.     . pioglitazone (ACTOS) 45 MG tablet Take 1 tablet by mouth daily.    . Saxagliptin-Metformin (KOMBIGLYZE XR) 2.12-998 MG TB24 Take 2 tablets by mouth daily.     . Vitamin D, Ergocalciferol, (DRISDOL) 50000 UNITS CAPS capsule Take 50,000 Units by mouth every 7 (seven) days.    Alveda Reasons 10 MG TABS tablet Take 1 tablet by mouth every evening.     No current facility-administered medications for this encounter.   Facility-Administered Medications Ordered in Other Encounters  Medication Dose Route Frequency Provider Last Rate Last Dose  . albuterol (PROVENTIL) (2.5 MG/3ML) 0.083% nebulizer solution 2.5 mg  2.5 mg Nebulization Once Nestor Lewandowsky, MD        ECOG PERFORMANCE STATUS:  0 - Asymptomatic  REVIEW OF SYSTEMS:  Patient denies any weight loss, fatigue, weakness, fever, chills or night sweats. Patient denies any loss of vision, blurred vision. Patient denies any ringing  of the ears or hearing loss. No irregular heartbeat. Patient denies heart murmur or history of fainting. Patient denies any chest pain or pain radiating to her upper extremities. Patient denies any shortness of breath, difficulty breathing at night, cough or hemoptysis. Patient denies any swelling in the lower legs. Patient denies any nausea vomiting, vomiting of blood, or coffee ground material in the vomitus. Patient denies any stomach pain. Patient states has had normal bowel movements no significant constipation or diarrhea. Patient denies any dysuria, hematuria or significant nocturia. Patient denies any problems walking, swelling in the joints or loss of balance. Patient denies any skin changes, loss of hair or loss of weight. Patient denies any excessive worrying or anxiety or significant depression. Patient denies any problems with insomnia. Patient denies excessive thirst, polyuria, polydipsia. Patient denies any swollen glands, patient denies easy bruising or easy bleeding. Patient denies any recent  infections, allergies or URI. Patient "s visual fields have not changed significantly in recent time.    PHYSICAL EXAM: There were no vitals taken for this visit. Well-developed female in NAD slightly obese. No cervical or supra clavicular adenopathy is identified. Well-developed well-nourished patient in NAD. HEENT reveals PERLA, EOMI, discs not visualized.  Oral cavity is clear. No oral mucosal lesions are identified. Neck is clear without evidence of cervical or supraclavicular adenopathy. Lungs are clear to A&P. Cardiac examination is essentially unremarkable with regular rate and rhythm without murmur rub or thrill. Abdomen is benign with no organomegaly or masses noted. Motor sensory and DTR levels are equal and symmetric in the upper and lower extremities. Cranial nerves II through XII are grossly intact. Proprioception is intact. No peripheral adenopathy or edema is identified. No motor or sensory levels are noted. Crude visual fields are within normal range.   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 Would plan on delivering 5000 cGy in 5 fractions. Risks and benefits of treatment including cough, fatigue, alteration of blood counts, all were discussed in detail with the patient and her family friend. Patient also be seen by ENT for discussion of thyroid care based on her recent follicular carcinoma diagnosis. I have set up and ordered CT simulation early next week. Case was discussed at our weekly tumor conference and recommendation for SB RT was confirmed.  I would like to take this opportunity for allowing me to participate in the care of your  patient.Armstead Peaks., MD

## 2015-05-03 NOTE — Progress Notes (Signed)
[facility] day:  05/02/2015  Chief Complaint: Dawn Allen is a 64 y.o. female with recently diagnosed squamous cell lung cancer who is referred by Dr Genevive Bi for new patient assessment.  HPI: The patient has a long standing smoking history (at least 1/2 pack a day for a number of years).  She describes having a dental abscess requiring admission to [facility] from 03/02/2015 - 0713/2016 for IV antibiotics.  At that time, she denied any respiratory issues.  CXR revealed a left upper lung mass.  Chest CT on 03/05/2015 revealed an enlarging LUL nodule (16 x 9 mm; previously 6 mm each) from 08/01/2014 worrisome for bronchogenic carcinoma.  Thyroid gland was heterogeneous.  PET scan on 03/18/2015 revealed a hypermetabolic 16 mm LUL nodule suspicious for primary lung cancer.  She also had hypermetabolic bilateral thyroid nodules.  On 04/23/2015, she underwent CT guided lung biopsy.  Pathology revealed non small cell carcinoma, favor squamous. FNA of right thyroid nodule on 04/29/2015 was suspicious for follicular neoplasm, Hurthle cell type (Bethesda category IV).  She has a history of bilateral lower extremity deep venous thrombosis.  She has an IVC filter in place.  She is on Xarelto.  She has been evaluated by surgery and is felt not to be a good surgical candidate.  She was presented at tumor board.  Symptomatically, she feels fine.  She denies any shortness of breath or cough.  She notes that her only problem is stress.  Past Medical History  Diagnosis Date  . Hypertension   . DVT (deep venous thrombosis)     LEFT LEG   . MI (myocardial infarction)   . Diabetes mellitus without complication   . Seizure disorder   . Coronary artery disease     a. NSTEMI cath 08/02/14: LM nl, pLAD 50%, mLCx 30%, mRCA 95% s/p PCI/DES, 2nd lesion 40%, EF 55%  . HLD (hyperlipidemia)   . Lung nodule   . Anxiety   . Seizures     Past Surgical History  Procedure  Laterality Date  . Abdominal hysterectomy      PARTIAL   . Leg surgery Right     BLOOD CLOT  . Cardiac catheterization  08/02/2014  . Coronary angioplasty  08/02/2014    drug eluting stent placement  . Ivc filter placement ([facility] hx)      Family History  Problem Relation Age of Onset  . Heart attack Mother     Social History:  reports that she has been smoking Cigarettes.  She has a 45 pack-year smoking history. She has never used smokeless tobacco. She reports that she does not drink alcohol or use illicit drugs.  The patient is accompanied by her friend, Rosamaria Lints, today.  Allergies:  Allergies  Allergen Reactions  . Penicillins     Current Medications: Current Outpatient Prescriptions  Medication Sig Dispense Refill  . atorvastatin (LIPITOR) 40 MG tablet Take 40 mg by mouth daily.    . clindamycin (CLEOCIN) 300 MG capsule Take 1 capsule (300 mg total) by mouth every 8 (eight) hours. 21 capsule 0  . clopidogrel (PLAVIX) 75 MG tablet Take 75 mg by mouth daily.    . diazepam (VALIUM) 5 MG tablet Take 1 tablet by mouth 2 (two) times daily as needed.    . empagliflozin (JARDIANCE) 25 MG TABS tablet Take 25 mg by mouth daily.    Marland Kitchen HYDROcodone-acetaminophen (NORCO/VICODIN) 5-325 MG per tablet Take 1 tablet by mouth every  8 (eight) hours as needed for moderate pain. 30 tablet 0  . LINZESS 290 MCG CAPS capsule Take 1 capsule by mouth daily.    Marland Kitchen lisinopril-hydrochlorothiazide (PRINZIDE,ZESTORETIC) 20-25 MG per tablet Take 1 tablet by mouth daily.     . metFORMIN (GLUCOPHAGE) 1000 MG tablet Take 1,000 mg by mouth 2 (two) times daily with a meal.    . metoprolol tartrate (LOPRESSOR) 25 MG tablet Take 25 mg by mouth 2 (two) times daily.    . nitroGLYCERIN (NITROSTAT) 0.4 MG SL tablet Place 0.4 mg under the tongue every 5 (five) minutes as needed for chest pain.    Marland Kitchen PARoxetine (PAXIL) 20 MG tablet Take 1 tablet by mouth daily.    . phenytoin (DILANTIN) 100 MG ER capsule Take 300  mg by mouth daily.     . pioglitazone (ACTOS) 45 MG tablet Take 1 tablet by mouth daily.    . Saxagliptin-Metformin (KOMBIGLYZE XR) 2.12-998 MG TB24 Take 2 tablets by mouth daily.     . Vitamin D, Ergocalciferol, (DRISDOL) 50000 UNITS CAPS capsule Take 50,000 Units by mouth every 7 (seven) days.    Alveda Reasons 10 MG TABS tablet Take 1 tablet by mouth every evening.     No current facility-administered medications for this visit.   Facility-Administered Medications Ordered in Other Visits  Medication Dose Route Frequency Provider Last Rate Last Dose  . albuterol (PROVENTIL) (2.5 MG/3ML) 0.083% nebulizer solution 2.5 mg  2.5 mg Nebulization Once Nestor Lewandowsky, MD        Review of Systems:  GENERAL:  Feels "fine".  Active.  No fevers or sweats.  20 pound weight loss in 6 months. PERFORMANCE STATUS (ECOG):  1 HEENT:  No visual changes, runny nose, sore throat, mouth sores or tenderness. Lungs: No shortness of breath or cough.  No hemoptysis. Cardiac:  No chest pain, palpitations, orthopnea, or PND. GI:  No nausea, vomiting, diarrhea, constipation, melena or hematochezia. GU:  No urgency, frequency, dysuria, or hematuria. Musculoskeletal:  No back pain.  No joint pain.  No muscle tenderness. Extremities:  No pain or swelling. Skin:  No rashes or skin changes. Neuro:  No headache, numbness or weakness, balance or coordination issues. Endocrine:  No diabetes, thyroid issues, hot flashes or night sweats. Psych:  Stress.  No mood changes, depression or anxiety. Pain:  No focal pain. Review of systems:  All other systems reviewed and found to be negative.  Physical Exam: There were no vitals taken for this visit. GENERAL:  Well developed, well nourished, sitting comfortably in the exam room in no acute distress.  She becomes quite agitated when discussing tobacco and walks out of room. MENTAL STATUS:  Alert and oriented to person, place and time. HEAD: Long brown hair.  Normocephalic,  atraumatic, face symmetric, no Cushingoid features. EYES:  Brown eyes.  No conjunctivitis or scleral icterus. SKIN:  No rashes, ulcers or lesions. EXTREMITIES: No edema, no skin discoloration.   NEUROLOGICAL: Unremarkable. PSYCH:  Agitated easily.  No visits with results within 3 Day(s) from this visit. Latest known visit with results is:  Hospital Outpatient Visit on 04/29/2015  Component Date Value Ref Range Status  . CYTOLOGY - NON GYN 04/29/2015    Final                   Value:Cytology - Non PAP CASE: [id number] PATIENT: Dawn Allen Non-Gyn Cytology Report     SPECIMEN SUBMITTED: A. Thyroid nodule, right, FNA  CLINICAL HISTORY: 64 year old female with  a history of bilateral thyroid nodules identified as FDG positive on prior PET-CT - 2.5 cm inferior left thyroid nodule, max SUV 5.6. 1.5 cm lateral right thyroid nodule, max SUV 7.1.  PRE-OPERATIVE DIAGNOSIS: Right thyroid nodule  POST-OPERATIVE DIAGNOSIS: Same as pre-op     DIAGNOSIS: A. THYROID NODULE, RIGHT; ULTRASOUND GUIDED FNA: - SUSPICIOUS FOR FOLLICULAR NEOPLASM, HURTHLE CELL TYPE (BETHESDA CATEGORY IV).  Comment: The slides contain focal chunky colloid, abundant Hurthle cells arranged in a crowded groups as well as singly. These cytologic findings support the above diagnosis.   GROSS DESCRIPTION:  A. Site: right thyroid nodule Procedure: [place] Cytotechnologist: Micheline Rough Specimen(s) collected: 3 Diff Quik stained slides 3 Pap stained slides Specimen                          labeled right thyroid nodule :      Description: pink CytoLyt solution      Submitted for:           ThinPrep           Cell block(s): 1   Final Diagnosis performed by Quay Burow, MD.  Electronically signed 05/02/2015 9:17:58AM    The electronic signature indicates that the named Attending Pathologist has evaluated the specimen  Technical component performed at [facility], [address] [place], [place]  [address] Lab: [phone] Dir: Darrick Penna. Evette Doffing, MD  Professional component performed at [facility], [facility], [place], [place], [place] [address] Lab: [phone] Dir: Dellia Nims. Reuel Derby, MD    . CYTOLOGY - NON GYN 04/29/2015    Final                   Value:Cytology - Non PAP CASE: [id number] PATIENT: Dawn Allen Non-Gyn Cytology Report     SPECIMEN SUBMITTED: A. Thryoid nodule, left, FNA  CLINICAL HISTORY: 64 year old female with a history of bilateral thyroid nodules identified as FDG positive on prior PET-CT - 2.5 cm inferior left thyroid nodule, max SUV 5.6. 1.5 cm lateral right thyroid nodule, max SUV 7.1.  PRE-OPERATIVE DIAGNOSIS: Left thyroid nodule  POST-OPERATIVE DIAGNOSIS: Same as pre-op     DIAGNOSIS: A. THYROID NODULE, LEFT; ULTRASOUND GUIDED FNA: - FOLLICULAR LESION OF UNDETERMINED SIGNIFICANCE (BETHESDA CATEGORY III).  Comment: The slides contain watery colloid, low cellularity of follicular groups some of which are arranged in a microfollicular pattern with crowding, single follicular cells, and a single intranuclear inclusion. These cytologic findings support the above diagnosis.  GROSS DESCRIPTION:  A. Site: left thyroid nodule Procedure: [place] Cytotechnologist: Micheline Rough S                         pecimen(s) collected: 3 Diff Quik stained slides 3 Pap stained slides Specimen labeled left thyroid nodule:      Description: pink CytoLyt solution      Submitted for:           ThinPrep           Cell block(s): 1   Final Diagnosis performed by Quay Burow, MD.  Electronically signed 05/02/2015 9:11:50AM    The electronic signature indicates that the named Attending Pathologist has evaluated the specimen  Technical component performed at [facility], [address], [place], [place] [address] Lab: [phone] Dir: Darrick Penna. Evette Doffing, MD  Professional component performed at [facility], [facility], [place], [place], [place] [address] Lab: [phone] Dir: Dellia Nims. Reuel Derby, MD  Assessment:  Dawn Allen is a 64 y.o. female with stage IA squamous cell lung cancer of the left upper lobe.  CT guided lung biopsy on 04/23/2015 revealed non small cell carcinoma, favor squamous.  PET scan on 03/18/2015 revealed a hypermetabolic 16 mm LUL nodule suspicious for primary lung cancer.  She also had hypermetabolic bilateral thyroid nodules.  She is not a surgical candidate.  She is being considered for stereotactic radiosurgery.  FNA right thyroid nodule on 04/29/2015 was suspicious for follicular neoplasm, Hurthle cell type (Bethesda category IV).  She has a history of bilateral lower extremity deep venous thrombosis.  She has an IVC filter in place.  She is on Xarelto.  Plan: 1.  Discuss diagnosis of lung cancer.  Discuss staging and prognosis.  Discuss plan for radiation ony.  Side effects briefly reviewed.  Discuss smoking cessation.  At that point patient got up and left the room. 2.  Anticipate follow-up of FNA right thyroid nodule suspicious for follicular neoplasm, Hurthle cell type (Bethesda category IV).  Anticipate ENT evaluation.  Possible resection or molecular diagnostic testing with either gene expression classifier or mutational analysis.  Risk of malignancy approximately 5-30%. 3.  Follow-up with radiation oncology. 4.  RTC prn.   Lequita Asal, MD  05/02/2015

## 2015-05-05 ENCOUNTER — Telehealth: Payer: Self-pay

## 2015-05-05 NOTE — Telephone Encounter (Signed)
  Oncology Nurse Navigator Documentation    Navigator Encounter Type: Other (05/05/15 1400)                      Time Spent with Patient: 30 (05/05/15 1400)   Received call from [place] who states she received call from surgery that wanted to set her up to take her thyroid out. Instructed her that Dr Genevive Bi had made a referral to Dr Burt Knack at [facility] surgical and the plan was that she does need to have her thyroid removed. She would like to talk more about it when she comes Wed for simulation. I did tell her that I did not know very much regarding thyroid surgery and I would call their office and ask her questions for her and we could talk about them when she comes in and get that appt scheduled for her. She agreed to plan.

## 2015-05-06 NOTE — Progress Notes (Signed)
Dawn Allen Inpatient Post-Op Note  Patient ID: Dawn Allen, female   DOB: 03-Apr-1951, 64 y.o.   MRN: [id number]  HISTORY: Here for followup of Lung and Thryroid biopsies.  Lung showed squamous cell carcinoma.  Complains of pain at thyroid biopsy site but not on chest wall.  Very anxious today   Filed Vitals:   05/02/15 1040  BP: 141/106  Pulse: 82  Temp: 98.6 F (37 C)     EXAM: Resp: Lungs are clear bilaterally.  No respiratory distress, normal effort. Heart:  Regular without murmurs  Neurological: Alert and oriented to person, place, and time. Coordination normal.  Skin: Skin is warm and dry. No rash noted. No diaphoretic. No erythema. No pallor.  Psychiatric: Anxious about the diagnosis. Normal behavior. Judgment and thought content normal.    ASSESSMENT: Reviewed the results of her lung pathology.  I think that she would be best served with RT as she lacks fortitude to undergo surgery.  She is in agreement and understands the risks and benefits of the options.  Aware that surgery has better overall survival in most cases.  She does not want to undergo surgery.   PLAN:   Stage I carcinoma of lung for which she will see Dr. Donella Stade and [name].    Thyroid mass to see Dr. Burt Knack for his opinion.    Nestor Lewandowsky, MD

## 2015-05-07 ENCOUNTER — Inpatient Hospital Stay: Payer: Medicare Other

## 2015-05-07 ENCOUNTER — Ambulatory Visit
Admission: RE | Admit: 2015-05-07 | Discharge: 2015-05-07 | Disposition: A | Discharge status: Home / Self Care | Payer: Medicare Other | Source: Ambulatory Visit | Attending: Radiation Oncology | Admitting: Radiation Oncology

## 2015-05-07 DIAGNOSIS — E119 Type 2 diabetes mellitus without complications: Secondary | ICD-10-CM | POA: Diagnosis not present

## 2015-05-07 DIAGNOSIS — Z955 Presence of coronary angioplasty implant and graft: Secondary | ICD-10-CM | POA: Diagnosis not present

## 2015-05-07 DIAGNOSIS — Z7902 Long term (current) use of antithrombotics/antiplatelets: Secondary | ICD-10-CM | POA: Diagnosis not present

## 2015-05-07 DIAGNOSIS — I252 Old myocardial infarction: Secondary | ICD-10-CM | POA: Diagnosis not present

## 2015-05-07 DIAGNOSIS — I82509 Chronic embolism and thrombosis of unspecified deep veins of unspecified lower extremity: Secondary | ICD-10-CM | POA: Diagnosis not present

## 2015-05-07 DIAGNOSIS — F17210 Nicotine dependence, cigarettes, uncomplicated: Secondary | ICD-10-CM | POA: Diagnosis not present

## 2015-05-07 DIAGNOSIS — Z79899 Other long term (current) drug therapy: Secondary | ICD-10-CM | POA: Diagnosis not present

## 2015-05-07 DIAGNOSIS — I2510 Atherosclerotic heart disease of native coronary artery without angina pectoris: Secondary | ICD-10-CM | POA: Diagnosis not present

## 2015-05-07 DIAGNOSIS — Z510 Encounter for antineoplastic radiation therapy: Secondary | ICD-10-CM | POA: Diagnosis present

## 2015-05-07 DIAGNOSIS — C3412 Malignant neoplasm of upper lobe, left bronchus or lung: Secondary | ICD-10-CM | POA: Diagnosis not present

## 2015-05-07 NOTE — Progress Notes (Signed)
  Oncology Nurse Navigator Documentation    Navigator Encounter Type: Other (Simulation) (05/07/15 1100) Patient Visit Type: Radonc (05/07/15 1100) Treatment Phase: CT SIM (05/07/15 1100)                  Time Spent with Patient: 30 (05/07/15 1100)   Called from XRT. Pt wanting to leave. Emotional support given. Escorted to CT sim and stayed during initial appt. Ms Lun also states she will go see surgeon regarding her thyroid but wants it to be after she finishes XRT. Dr Genevive Bi has said this will be okay and Ms Ryle was relayed this information.

## 2015-05-13 DIAGNOSIS — Z510 Encounter for antineoplastic radiation therapy: Secondary | ICD-10-CM | POA: Diagnosis not present

## 2015-05-19 ENCOUNTER — Ambulatory Visit
Admission: RE | Admit: 2015-05-19 | Discharge: 2015-05-19 | Disposition: A | Discharge status: Home / Self Care | Payer: Medicare Other | Source: Ambulatory Visit | Attending: Radiation Oncology | Admitting: Radiation Oncology

## 2015-05-19 DIAGNOSIS — Z510 Encounter for antineoplastic radiation therapy: Secondary | ICD-10-CM | POA: Diagnosis not present

## 2015-05-20 ENCOUNTER — Ambulatory Visit: Payer: Medicare Other | Admitting: Radiation Oncology

## 2015-05-21 ENCOUNTER — Ambulatory Visit
Admission: RE | Admit: 2015-05-21 | Discharge: 2015-05-21 | Disposition: A | Discharge status: Home / Self Care | Payer: Medicare Other | Source: Ambulatory Visit | Attending: Radiation Oncology | Admitting: Radiation Oncology

## 2015-05-21 DIAGNOSIS — Z510 Encounter for antineoplastic radiation therapy: Secondary | ICD-10-CM | POA: Diagnosis not present

## 2015-05-22 ENCOUNTER — Ambulatory Visit: Payer: Medicare Other | Admitting: Radiation Oncology

## 2015-05-23 ENCOUNTER — Ambulatory Visit
Admission: RE | Admit: 2015-05-23 | Discharge: 2015-05-23 | Disposition: A | Discharge status: Home / Self Care | Payer: Medicare Other | Source: Ambulatory Visit | Attending: Radiation Oncology | Admitting: Radiation Oncology

## 2015-05-23 ENCOUNTER — Ambulatory Visit: Payer: Medicare Other | Admitting: Radiation Oncology

## 2015-05-26 ENCOUNTER — Ambulatory Visit
Admission: RE | Admit: 2015-05-26 | Discharge: 2015-05-26 | Disposition: A | Discharge status: Home / Self Care | Payer: Medicare Other | Source: Ambulatory Visit | Attending: Radiation Oncology | Admitting: Radiation Oncology

## 2015-05-26 DIAGNOSIS — Z955 Presence of coronary angioplasty implant and graft: Secondary | ICD-10-CM | POA: Diagnosis not present

## 2015-05-26 DIAGNOSIS — I252 Old myocardial infarction: Secondary | ICD-10-CM | POA: Diagnosis not present

## 2015-05-26 DIAGNOSIS — Z510 Encounter for antineoplastic radiation therapy: Secondary | ICD-10-CM | POA: Diagnosis not present

## 2015-05-26 DIAGNOSIS — I2510 Atherosclerotic heart disease of native coronary artery without angina pectoris: Secondary | ICD-10-CM | POA: Diagnosis not present

## 2015-05-26 DIAGNOSIS — C3412 Malignant neoplasm of upper lobe, left bronchus or lung: Secondary | ICD-10-CM | POA: Diagnosis not present

## 2015-05-26 DIAGNOSIS — I82509 Chronic embolism and thrombosis of unspecified deep veins of unspecified lower extremity: Secondary | ICD-10-CM | POA: Diagnosis not present

## 2015-05-26 DIAGNOSIS — E119 Type 2 diabetes mellitus without complications: Secondary | ICD-10-CM | POA: Diagnosis not present

## 2015-05-26 DIAGNOSIS — Z7902 Long term (current) use of antithrombotics/antiplatelets: Secondary | ICD-10-CM | POA: Diagnosis not present

## 2015-05-26 DIAGNOSIS — Z79899 Other long term (current) drug therapy: Secondary | ICD-10-CM | POA: Diagnosis not present

## 2015-05-26 DIAGNOSIS — F17210 Nicotine dependence, cigarettes, uncomplicated: Secondary | ICD-10-CM | POA: Diagnosis not present

## 2015-05-27 ENCOUNTER — Ambulatory Visit: Payer: Medicare Other | Admitting: Radiation Oncology

## 2015-05-28 ENCOUNTER — Ambulatory Visit
Admission: RE | Admit: 2015-05-28 | Discharge: 2015-05-28 | Disposition: A | Discharge status: Home / Self Care | Payer: Medicare Other | Source: Ambulatory Visit | Attending: Radiation Oncology | Admitting: Radiation Oncology

## 2015-05-28 ENCOUNTER — Ambulatory Visit: Payer: Medicare Other | Admitting: Radiation Oncology

## 2015-05-28 DIAGNOSIS — Z510 Encounter for antineoplastic radiation therapy: Secondary | ICD-10-CM | POA: Diagnosis not present

## 2015-05-29 ENCOUNTER — Ambulatory Visit: Payer: Medicare Other | Admitting: Radiation Oncology

## 2015-05-30 ENCOUNTER — Ambulatory Visit
Admission: RE | Admit: 2015-05-30 | Discharge: 2015-05-30 | Disposition: A | Discharge status: Home / Self Care | Payer: Medicare Other | Source: Ambulatory Visit | Attending: Radiation Oncology | Admitting: Radiation Oncology

## 2015-05-30 DIAGNOSIS — Z510 Encounter for antineoplastic radiation therapy: Secondary | ICD-10-CM | POA: Diagnosis not present

## 2015-06-03 ENCOUNTER — Encounter: Payer: Self-pay | Admitting: Hematology and Oncology

## 2015-06-03 ENCOUNTER — Ambulatory Visit: Payer: Medicare Other | Admitting: Radiation Oncology

## 2015-06-13 ENCOUNTER — Telehealth: Payer: Self-pay | Admitting: *Deleted

## 2015-06-13 NOTE — Telephone Encounter (Signed)
Notified patient of appointment with ENT provider on 06/19/15, [name] will pick her up at 1:45pm. Informed that copay amount should not be greater than $3.00 per Dory Larsen at ENT office.

## 2015-07-02 ENCOUNTER — Other Ambulatory Visit: Payer: Self-pay | Admitting: *Deleted

## 2015-07-02 ENCOUNTER — Encounter: Payer: Self-pay | Admitting: Radiation Oncology

## 2015-07-02 ENCOUNTER — Ambulatory Visit
Admission: RE | Admit: 2015-07-02 | Discharge: 2015-07-02 | Disposition: A | Discharge status: Home / Self Care | Payer: Medicare Other | Source: Ambulatory Visit | Attending: Radiation Oncology | Admitting: Radiation Oncology

## 2015-07-02 VITALS — BP 158/94 | HR 83 | Temp 96.5°F | Resp 20 | Wt 204.6 lb

## 2015-07-02 DIAGNOSIS — C3412 Malignant neoplasm of upper lobe, left bronchus or lung: Secondary | ICD-10-CM

## 2015-07-02 DIAGNOSIS — Z85118 Personal history of other malignant neoplasm of bronchus and lung: Secondary | ICD-10-CM

## 2015-07-02 NOTE — Progress Notes (Signed)
Radiation Oncology Follow up Note  Name: Dawn Allen   Date:   07/02/2015 MRN:  [id number] DOB: 1950-12-27    This 64 y.o. female presents to the clinic today for follow-up for stage I lung cancer status post SB RT.  REFERRING PROVIDER: Lorelee Market, MD  HPI: Patient is a 64 year old female now 1 month out having completed SB RT for 1.6 cm left upper lobe pulmonary nodule. Histology favored squamous cell carcinoma. She is now 1 month out of SB RT and doing well. She is a mild slightly productive clear cough. She's also being worked up for possible thyroid nodule which time she revealed to be well-differentiated follicular carcinoma [place]da category 3. She's having no other symptoms or complaints from her prior SB RT.Marland Kitchen  COMPLICATIONS OF TREATMENT: none  FOLLOW UP COMPLIANCE: keeps appointments   PHYSICAL EXAM:  BP 158/94 mmHg  Pulse 83  Temp(Src) 96.5 F (35.8 C)  Resp 20  Wt 204 lb 9.4 oz (92.8 kg) Well-developed well-nourished patient in NAD. HEENT reveals PERLA, EOMI, discs not visualized.  Oral cavity is clear. No oral mucosal lesions are identified. Neck is clear without evidence of cervical or supraclavicular adenopathy. Lungs are clear to A&P. Cardiac examination is essentially unremarkable with regular rate and rhythm without murmur rub or thrill. Abdomen is benign with no organomegaly or masses noted. Motor sensory and DTR levels are equal and symmetric in the upper and lower extremities. Cranial nerves II through XII are grossly intact. Proprioception is intact. No peripheral adenopathy or edema is identified. No motor or sensory levels are noted. Crude visual fields are within normal range.  RADIOLOGY RESULTS: No current films for review  PLAN: At the present time she is doing well with no smoking side effects or complaints. She does have a mild slightly productive cough. I've suggested she uses some Mucinex for that. Otherwise I'm please were overall progress and of  asked to see her back in 3 months for follow-up. Will obtain and have ordered a CT scan with contrast prior to her visit with me. Patient knows to call sooner with any concerns.  I would like to take this opportunity for allowing me to participate in the care of your patient.Armstead Peaks., MD

## 2015-07-04 ENCOUNTER — Other Ambulatory Visit: Payer: Self-pay | Admitting: Otolaryngology

## 2015-07-04 DIAGNOSIS — E042 Nontoxic multinodular goiter: Secondary | ICD-10-CM

## 2015-07-08 ENCOUNTER — Ambulatory Visit
Admission: RE | Admit: 2015-07-08 | Discharge: 2015-07-08 | Disposition: A | Discharge status: Home / Self Care | Payer: Medicare Other | Source: Ambulatory Visit | Attending: Otolaryngology | Admitting: Otolaryngology

## 2015-07-08 ENCOUNTER — Other Ambulatory Visit: Payer: Self-pay | Admitting: Otolaryngology

## 2015-07-08 DIAGNOSIS — E042 Nontoxic multinodular goiter: Secondary | ICD-10-CM | POA: Insufficient documentation

## 2015-07-08 NOTE — Procedures (Signed)
Bilateral thyroid biopsy for Afirma testing  Complications:  None  Blood Loss: none  See dictation in canopy pacs

## 2015-07-10 ENCOUNTER — Telehealth: Payer: Self-pay

## 2015-08-01 ENCOUNTER — Encounter: Payer: Self-pay | Admitting: Diagnostic Radiology

## 2015-08-12 ENCOUNTER — Telehealth: Payer: Self-pay

## 2015-08-12 NOTE — Telephone Encounter (Signed)
  Oncology Nurse Navigator Documentation    Navigator Encounter Type: Telephone (08/12/15 1400)     Barriers/Navigation Needs: Transportation (08/12/15 1400)   Interventions: Transportations (08/12/15 1400)            Time Spent with Patient: 30 (08/12/15 1400)   Pt called and reports new finding in right breast on self exam. States lump is not bigger than a golf ball. Transportation has been arranged for 12/22 0900 with [name] and she will be given a breast exam and follow up after exam.

## 2015-08-14 ENCOUNTER — Encounter: Payer: Self-pay | Admitting: *Deleted

## 2015-08-14 ENCOUNTER — Inpatient Hospital Stay: Payer: Medicare Other | Attending: Family Medicine | Admitting: Family Medicine

## 2015-08-14 ENCOUNTER — Encounter: Payer: Self-pay | Admitting: Family Medicine

## 2015-08-14 VITALS — BP 160/82 | HR 79 | Temp 97.8°F | Resp 18 | Ht 65.0 in | Wt 209.0 lb

## 2015-08-14 DIAGNOSIS — N6310 Unspecified lump in the right breast, unspecified quadrant: Secondary | ICD-10-CM

## 2015-08-14 NOTE — Progress Notes (Signed)
Patient phoned Dawn Allen, and Dawn Allen earlier this week reporting a right breast lump.  CBE today reveals a 3.5 cm firm ,mobile mass at 11 o'clock right breast 3 cm. from areola. Patient states she first noticed a week ago when lying on right side.  Last mammogram in 2009 at [facility] with normal results.  Explained need for mammogram ,and ultrasound.  Patient at first resistant, but agreed to mammogram, and ultrasound, and biopsy if needed.  Scheduled for mammogram on 08/26/15 at 0900.  [name] to transport patient.  Navigators to meet in [employer]. Spoke to [employer], mammography tech regarding patient's anxiety.  She will be there to support patient , along with navigators.  Oncology Nurse Navigator Documentation    Navigator Encounter Type: Screening;Education (08/14/15 1100)

## 2015-08-14 NOTE — Progress Notes (Signed)
  Oncology Nurse Navigator Documentation    Navigator Encounter Type: Education;Other (support) (08/14/15 1100)                      Time Spent with Patient: 60 (08/14/15 1100)   Stayed with patient during appt for support and encouragement for proper follow up regarding breast nodule. She has her scheduled mammogram appt in hand for 08/25/14. [name] to provide transportation.

## 2015-08-14 NOTE — Progress Notes (Signed)
The patient presents to clinic with new findings of a palpable right breast mass-found on self breast exam. h/o lung cancer. She has not had a mammogram since 2009.

## 2015-08-14 NOTE — Progress Notes (Signed)
After discussion with patient on Monday 08/11/15 and [name] NP, met with patient today in clinic along with [name] and [name]. Reviewed plan of care with patient, arranged transportation with [name], and discussed need for medical oncologist visit in future. Patient is agreeable to this but does not desire to have appointment with [name] whom she has seen in the past. Patient verbalizes desire for followup after breast evaluation with [name].

## 2015-08-14 NOTE — Progress Notes (Deleted)
Patient phoned Dawn Allen, and Dawn Allen earlier this week reporting a right breast lump.  CBE today reveals a 3.5 cm firm ,mobile mass at 11 o'clock right breast 3 cm. from areola. Patient states she first noticed a week ago when lying on right side.  Last mammogram in 2009 at [facility] with normal results.  Explained need for mammogram ,and ultrasound.  Patient at first resistant, but agreed to mammogram, and ultrasound, and biopsy if needed.  Scheduled for mammogram on 08/26/15 at 0900.  [name] to transport patient.  Navigators to meet in [employer]. Spoke to [employer], mammography tech regarding patient's anxiety.  She will be there to support patient , along with navigators.  Oncology Nurse Navigator Documentation    Navigator Encounter Type: Screening;Education (08/14/15 1100)                      Time Spent with Patient: 60 (08/14/15 1100)

## 2015-08-20 ENCOUNTER — Other Ambulatory Visit: Payer: Self-pay | Admitting: Family Medicine

## 2015-08-21 ENCOUNTER — Other Ambulatory Visit: Payer: Self-pay | Admitting: Family Medicine

## 2015-08-21 DIAGNOSIS — N6310 Unspecified lump in the right breast, unspecified quadrant: Secondary | ICD-10-CM

## 2015-08-24 DIAGNOSIS — C50919 Malignant neoplasm of unspecified site of unspecified female breast: Secondary | ICD-10-CM

## 2015-08-24 HISTORY — DX: Malignant neoplasm of unspecified site of unspecified female breast: C50.919

## 2015-08-25 ENCOUNTER — Emergency Department
Admission: EM | Admit: 2015-08-25 | Discharge: 2015-08-25 | Disposition: A | Discharge status: Home / Self Care | Payer: Medicare Other | Attending: Emergency Medicine | Admitting: Emergency Medicine

## 2015-08-25 ENCOUNTER — Encounter: Payer: Self-pay | Admitting: Emergency Medicine

## 2015-08-25 DIAGNOSIS — Z79899 Other long term (current) drug therapy: Secondary | ICD-10-CM | POA: Insufficient documentation

## 2015-08-25 DIAGNOSIS — Z7901 Long term (current) use of anticoagulants: Secondary | ICD-10-CM | POA: Diagnosis not present

## 2015-08-25 DIAGNOSIS — Y9289 Other specified places as the place of occurrence of the external cause: Secondary | ICD-10-CM | POA: Insufficient documentation

## 2015-08-25 DIAGNOSIS — Z7984 Long term (current) use of oral hypoglycemic drugs: Secondary | ICD-10-CM | POA: Insufficient documentation

## 2015-08-25 DIAGNOSIS — W1849XA Other slipping, tripping and stumbling without falling, initial encounter: Secondary | ICD-10-CM | POA: Diagnosis not present

## 2015-08-25 DIAGNOSIS — Y9389 Activity, other specified: Secondary | ICD-10-CM | POA: Insufficient documentation

## 2015-08-25 DIAGNOSIS — Z880 Allergy status to penicillin: Secondary | ICD-10-CM | POA: Insufficient documentation

## 2015-08-25 DIAGNOSIS — F17210 Nicotine dependence, cigarettes, uncomplicated: Secondary | ICD-10-CM | POA: Diagnosis not present

## 2015-08-25 DIAGNOSIS — Y998 Other external cause status: Secondary | ICD-10-CM | POA: Diagnosis not present

## 2015-08-25 DIAGNOSIS — M7122 Synovial cyst of popliteal space [Baker], left knee: Secondary | ICD-10-CM | POA: Insufficient documentation

## 2015-08-25 DIAGNOSIS — S8992XA Unspecified injury of left lower leg, initial encounter: Secondary | ICD-10-CM | POA: Diagnosis present

## 2015-08-25 DIAGNOSIS — I10 Essential (primary) hypertension: Secondary | ICD-10-CM | POA: Diagnosis not present

## 2015-08-25 DIAGNOSIS — E1149 Type 2 diabetes mellitus with other diabetic neurological complication: Secondary | ICD-10-CM | POA: Insufficient documentation

## 2015-08-25 DIAGNOSIS — F419 Anxiety disorder, unspecified: Secondary | ICD-10-CM | POA: Diagnosis not present

## 2015-08-25 MED ORDER — NAPROXEN 500 MG PO TABS: 500.0000 mg | ORAL_TABLET | Freq: Two times a day (BID) | ORAL | Status: DC

## 2015-08-25 MED ORDER — HYDROCODONE-ACETAMINOPHEN 5-325 MG PO TABS
1.0000 | ORAL_TABLET | Freq: Once | ORAL | Status: AC
  Administered 2015-08-25: 1 via ORAL
  Filled 2015-08-25: qty 1

## 2015-08-25 MED ORDER — HYDROCODONE-ACETAMINOPHEN 5-325 MG PO TABS: 1.0000 | ORAL_TABLET | ORAL | Status: DC | PRN

## 2015-08-25 NOTE — Discharge Instructions (Signed)
Baker Cyst A Baker cyst is a sac-like structure that forms in the back of the knee. It is filled with the same fluid that is located in your knee. This fluid lubricates the bones and cartilage of the knee and allows them to move over each other more easily. CAUSES  When the knee becomes injured or inflamed, increased fluid forms in the knee. When this happens, the joint lining is pushed out behind the knee and forms the Baker cyst. This cyst may also be caused by inflammation from arthritic conditions and infections. SIGNS AND SYMPTOMS  A Baker cyst usually has no symptoms. When the cyst is substantially enlarged:  You may feel pressure behind the knee, stiffness in the knee, or a mass in the area behind the knee.  You may develop pain, redness, and swelling in the calf. This can suggest a blood clot and requires evaluation by your health care provider. DIAGNOSIS  A Baker cyst is most often found during an ultrasound exam. This exam may have been performed for other reasons, and the cyst was found incidentally. Sometimes an MRI is used. This picks up other problems within a joint that an ultrasound exam may not. If the Baker cyst developed immediately after an injury, X-ray exams may be used to diagnose the cyst. TREATMENT  The treatment depends on the cause of the cyst. Anti-inflammatory medicines and rest often will be prescribed. If the cyst is caused by a bacterial infection, antibiotic medicines may be prescribed.  HOME CARE INSTRUCTIONS   If the cyst was caused by an injury, for the first 24 hours, keep the injured leg elevated on 2 pillows while lying down.  For the first 24 hours while you are awake, apply ice to the injured area:  Put ice in a plastic bag.  Place a towel between your skin and the bag.  Leave the ice on for 20 minutes, 2-3 times a day.  Only take over-the-counter or prescription medicines for pain, discomfort, or fever as directed by your health care  provider.  Only take antibiotic medicine as directed. Make sure to finish it even if you start to feel better. MAKE SURE YOU:   Understand these instructions.  Will watch your condition.  Will get help right away if you are not doing well or get worse.   This information is not intended to replace advice given to you by your health care provider. Make sure you discuss any questions you have with your health care provider.   Document Released: 08/09/2005 Document Revised: 05/30/2013 Document Reviewed: 03/21/2013 Elsevier Interactive Patient Education [employer].

## 2015-08-25 NOTE — ED Provider Notes (Signed)
[facility] Emergency Department Provider Note  ____________________________________________  Time seen: 11 AM  I have reviewed the triage vital signs and the nursing notes.   HISTORY  Chief Complaint Leg Pain    HPI Dawn Allen is a 65 y.o. female who presents with left leg pain. Patient reports she was stepping out of her car yesterday and felt a pop behind the left knee. Since then she has had pain in the left knee and has had some difficulty ambulating. She did not fall. She denies calf pain. He complains of mild swelling to her left knee. No fevers or erythema. She reports that she was able to hear her knee pop. She does report a history of DVT and is on Xarelto     Past Medical History  Diagnosis Date  . Hypertension   . DVT (deep venous thrombosis) (HCC)     LEFT LEG   . MI (myocardial infarction) ([place])   . Diabetes mellitus without complication ([place])   . Seizure disorder ([place])   . Coronary artery disease     a. NSTEMI cath 08/02/14: LM nl, pLAD 50%, mLCx 30%, mRCA 95% s/p PCI/DES, 2nd lesion 40%, EF 55%  . HLD (hyperlipidemia)   . Lung nodule   . Anxiety   . Seizures ([place])   . 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 Mass of right breast 08/14/2015  . Thyroid nodule 04/29/2015  . 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 Hypokalemia 03/02/2015  . Leukocytosis 03/02/2015  . Generalized weakness 03/02/2015  . Lung nodule 03/02/2015  . Pain in a tooth or teeth 03/02/2015  . Sepsis ([place]) 03/02/2015  . Lung mass 03/02/2015  . SIRS (systemic inflammatory response syndrome) ([place]) 02/27/2015  . Coronary artery disease   . Hypertension   . DVT (deep venous thrombosis) ([place])   . HLD (hyperlipidemia)   . Achilles bursitis or tendinitis 05/31/2013  . 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 Other hammer toe (acquired) 05/31/2013  . 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 Abdominal hysterectomy      PARTIAL   . Leg surgery Right     BLOOD CLOT  . Cardiac catheterization  08/02/2014  . Coronary angioplasty  08/02/2014    drug eluting stent placement  . Ivc filter placement ([facility] hx)      Current Outpatient Rx  Name  Route  Sig  Dispense  Refill  . atorvastatin (LIPITOR) 40 MG tablet   Oral   Take 40 mg by mouth daily.         . diazepam (VALIUM) 5 MG tablet   Oral   Take 1 tablet by mouth 2 (two) times daily as needed.         . empagliflozin (JARDIANCE) 25 MG TABS tablet   Oral   Take 25 mg by mouth daily.         Marland Kitchen lisinopril-hydrochlorothiazide (PRINZIDE,ZESTORETIC) 20-25 MG per tablet   Oral   Take 1 tablet by mouth daily.          . metoprolol tartrate (LOPRESSOR) 25 MG tablet   Oral   Take 25 mg by mouth 2 (two) times daily.         . nitroGLYCERIN (NITROSTAT) 0.4 MG SL tablet   Sublingual   Place 0.4 mg under the tongue every 5 (five) minutes as  needed for chest pain.         . phenytoin (DILANTIN) 100 MG ER capsule   Oral   Take 300 mg by mouth daily.          . pioglitazone (ACTOS) 45 MG tablet   Oral   Take 1 tablet by mouth daily.         . sitaGLIPtin (JANUVIA) 100 MG tablet   Oral   Take 100 mg by mouth daily.         Alveda Reasons 10 MG TABS tablet   Oral   Take 1 tablet by mouth every evening.           Dispense as written.     Allergies Penicillins  Family History  Problem Relation Age of Onset  . Heart attack Mother     Social History Social History  Substance Use Topics  . Smoking status: Current Every Day Smoker -- 1.00 packs/day for 45 years    Types: Cigarettes  . Smokeless tobacco: Never Used  . Alcohol Use: No    Review of Systems  Constitutional: Negative for fever. Eyes: Negative for visual changes. ENT: Negative for neck pain Cardiovascular: Negative for chest  pain. Respiratory: Negative for shortness of breath. Gastrointestinal: Negative for abdominal pain Genitourinary: Negative for dysuria. Musculoskeletal: Negative for back pain. Positive for left knee pain Skin: Negative for rash. Neurological: Negative for headaches or focal weakness Psychiatric: Positive for anxiety    ____________________________________________   PHYSICAL EXAM:  VITAL SIGNS: ED Triage Vitals  Enc Vitals Group     BP 08/25/15 1022 139/73 mmHg     Pulse Rate 08/25/15 1022 77     Resp 08/25/15 1022 20     Temp 08/25/15 1022 98.2 F (36.8 C)     Temp Source 08/25/15 1022 Oral     SpO2 08/25/15 1022 99 %     Weight 08/25/15 1004 209 lb (94.802 kg)     Height 08/25/15 1004 '5\' 5"'$  (1.651 m)     Head Cir --      Peak Flow --      Pain Score 08/25/15 1005 10     Pain Loc --      Pain Edu? --      Excl. in [place]? --      Constitutional: Alert and oriented. Well appearing and in no distress. Eyes: Conjunctivae are normal.  ENT   Head: Normocephalic and atraumatic.   Mouth/Throat: Mucous membranes are moist. Cardiovascular: Normal rate, regular rhythm. Normal and symmetric distal pulses are present in all extremities. No murmurs, rubs, or gallops. Respiratory: Normal respiratory effort without tachypnea nor retractions. Breath sounds are clear and equal bilaterally.  Gastrointestinal: Soft and non-tender in all quadrants. No distention. There is no CVA tenderness. Genitourinary: deferred Musculoskeletal: Nontender with normal range of motion in all extremities. No lower extremity tenderness nor edema. Left knee ligaments appear intact although patient does have pain with rotation of distal extremity, she has point tenderness medially and posteriorly to the knee suspicious for a Baker cyst Neurologic:  Normal speech and language. No gross focal neurologic deficits are appreciated. Skin:  Skin is warm, dry and intact. No rash noted. Psychiatric: Mood and  affect are normal. Patient exhibits appropriate insight and judgment.  ____________________________________________    LABS (pertinent positives/negatives)  Labs Reviewed - No data to display  ____________________________________________   EKG  None  ____________________________________________    RADIOLOGY I have personally reviewed any xrays that were ordered  on this patient: None  ____________________________________________   PROCEDURES  Procedure(s) performed: none  Critical Care performed: none  ____________________________________________   INITIAL IMPRESSION / ASSESSMENT AND PLAN / ED COURSE  Pertinent labs & imaging results that were available during my care of the patient were reviewed by me and considered in my medical decision making (see chart for details).  History of present illness most consistent with ligament injury with likely developing a Baker cyst. Imaging not felt to be necessary given no fall injury. Patient is on Xarelto for DVT. She will follow-up with orthopedics for further evaluation. Knee immobilizer provided and walker but patient refuses to use walker became angry with nurse for unknown reason  ____________________________________________   FINAL CLINICAL IMPRESSION(S) / ED DIAGNOSES  Final diagnoses:  Baker's cyst of knee, left     Lavonia Drafts, MD 08/25/15 1513

## 2015-08-25 NOTE — ED Notes (Signed)
Pt presents with pain to back of left knee. Pt states she was getting out of her vehicle yesterday and heard a "pop." States she is unable to get up without help and was not able to lie down to sleep last night. NAD noted.

## 2015-08-25 NOTE — ED Notes (Signed)
Pt discharged home after verbalizing understanding of discharge instructions; nad noted. 

## 2015-08-25 NOTE — ED Notes (Signed)
Pt to ed with c/o left leg pain that started yesterday when she got out of car.  Pt states she heard a "pop" and then the pain started.

## 2015-08-25 NOTE — ED Notes (Signed)
Pt refused walker.

## 2015-08-25 NOTE — ED Notes (Signed)
Pt frustrated that it took too long to discharge her. Explained to pt that it generally takes 2-4 hours (minimum) to complete a visit to ED. She became angry and stated that she is well aware of how long it takes, and that she did not appreciate my telling her that. She demanded to know this nurse's last name. This nurse stated that we do not give out last names, but that there is only one [facility] in the department. Pt refused to take jacket off for blood pressure measurement, so bp taken over jacket on forearm.

## 2015-08-26 ENCOUNTER — Other Ambulatory Visit: Payer: Medicare Other

## 2015-08-26 ENCOUNTER — Ambulatory Visit: Admission: RE | Admit: 2015-08-26 | Payer: Medicare Other | Source: Ambulatory Visit

## 2015-08-26 ENCOUNTER — Telehealth: Payer: Self-pay | Admitting: *Deleted

## 2015-08-26 NOTE — Telephone Encounter (Signed)
Patient called yesterday to notify of need to cancel breast and thryroid appointments due to immobility from presumed Bakers cyst. Encouraged to try to keep appointments and discussed need for evaluation of both abnormalities. Had Tanya Nones contact patient this morning to discuss further. Per patient request appointments cancelled. Patient is instructed to contact us when we can reschedule.

## 2015-08-27 ENCOUNTER — Ambulatory Visit: Payer: Medicare Other | Admitting: Surgery

## 2015-08-28 ENCOUNTER — Telehealth: Payer: Self-pay | Admitting: *Deleted

## 2015-08-28 NOTE — Telephone Encounter (Signed)
Called and informed patient of her new appointment for mammogram on Monday 09/01/15 @ 9:40.  The cancer [name] will pick her up at 8:45.  She is agreeable.

## 2015-09-01 ENCOUNTER — Inpatient Hospital Stay: Admission: RE | Admit: 2015-09-01 | Payer: Medicare Other | Source: Ambulatory Visit

## 2015-09-01 ENCOUNTER — Other Ambulatory Visit: Payer: Medicare Other

## 2015-09-01 ENCOUNTER — Ambulatory Visit: Payer: Medicare Other | Attending: Family Medicine

## 2015-09-02 ENCOUNTER — Telehealth: Payer: Self-pay

## 2015-09-02 ENCOUNTER — Telehealth: Payer: Self-pay | Admitting: *Deleted

## 2015-09-02 NOTE — Telephone Encounter (Signed)
  Oncology Nurse Navigator Documentation    Navigator Encounter Type: Telephone (09/02/15 1400)               Barriers/Navigation Needs: Transportation (09/02/15 1400)   Interventions: Coordination of Care (09/02/15 1400)    Talked to patient earlier this morning and informed of her rescheduled appointment for her mammogram on 09/10/15 @ 11:00 with [name] pick up at 10:15.  Informed her we would call with her rescheduled appointment with [facility].  I have left her a message with her appointment to see Dr. Everlena Cooper at [facility] on 09/15/15 @ 2:00.  The cancer [name] is to pick her up at 1:15.  She is to call me back to confirm she received my message or if she has any questions.                  Time Spent with Patient: 30 (09/02/15 1400)

## 2015-09-02 NOTE — Telephone Encounter (Signed)
Spoke with Tanya Nones at this time in regards to patient being a No Show last week for her Thyroid Mass appointment. She explains that patient currently has a palpable breast mass that is being worked up. She is scheduled for a Mammogram at [place] on 09/10/15 and she would like patient to be seen after this date so that we can discuss breast mass and thyroid mass at same time as patient has extreme difficulty getting tranportation to appointments.   Appointment scheduled with Dr. Adonis Huguenin on 09/15/15 at [place] will let patient know and set-up [place] for transportation to and from this appointment.

## 2015-09-10 ENCOUNTER — Ambulatory Visit
Admission: RE | Admit: 2015-09-10 | Discharge: 2015-09-10 | Disposition: A | Discharge status: Home / Self Care | Payer: Medicare Other | Source: Ambulatory Visit | Attending: Family Medicine | Admitting: Family Medicine

## 2015-09-10 ENCOUNTER — Other Ambulatory Visit: Payer: Self-pay | Admitting: *Deleted

## 2015-09-10 DIAGNOSIS — C3490 Malignant neoplasm of unspecified part of unspecified bronchus or lung: Secondary | ICD-10-CM | POA: Diagnosis not present

## 2015-09-10 DIAGNOSIS — N63 Unspecified lump in unspecified breast: Secondary | ICD-10-CM

## 2015-09-10 DIAGNOSIS — N6310 Unspecified lump in the right breast, unspecified quadrant: Secondary | ICD-10-CM

## 2015-09-11 ENCOUNTER — Other Ambulatory Visit: Payer: Self-pay | Admitting: Radiation Oncology

## 2015-09-11 DIAGNOSIS — N63 Unspecified lump in unspecified breast: Secondary | ICD-10-CM

## 2015-09-15 ENCOUNTER — Ambulatory Visit
Admission: RE | Admit: 2015-09-15 | Discharge: 2015-09-15 | Disposition: A | Discharge status: Home / Self Care | Payer: Medicare Other | Source: Ambulatory Visit | Attending: Radiation Oncology | Admitting: Radiation Oncology

## 2015-09-15 ENCOUNTER — Other Ambulatory Visit: Payer: Self-pay | Admitting: *Deleted

## 2015-09-15 ENCOUNTER — Ambulatory Visit: Payer: Self-pay | Admitting: Surgery

## 2015-09-15 ENCOUNTER — Encounter: Payer: Self-pay | Admitting: *Deleted

## 2015-09-15 DIAGNOSIS — D0581 Other specified type of carcinoma in situ of right breast: Secondary | ICD-10-CM | POA: Insufficient documentation

## 2015-09-15 DIAGNOSIS — N63 Unspecified lump in unspecified breast: Secondary | ICD-10-CM

## 2015-09-15 HISTORY — PX: BREAST BIOPSY: SHX20

## 2015-09-15 MED ORDER — HYDROCODONE-ACETAMINOPHEN 5-325 MG PO TABS: 1.0000 | ORAL_TABLET | ORAL | Status: DC | PRN

## 2015-09-15 NOTE — Progress Notes (Signed)
  Oncology Nurse Navigator Documentation  Navigator Location: [facility] (09/15/15 1100) Navigator Encounter Type: Other (breast biopsy) (09/15/15 1100)   Abnormal Finding Date: 09/10/15 (09/15/15 1100)       Patient Visit Type:  (Biopsy) (09/15/15 1100)   Barriers/Navigation Needs: Transportation;Coordination of Care (09/15/15 1100)   Interventions: Coordination of Care (09/15/15 1100)   Coordination of Care: Radiology (09/15/15 1100)      Stayed with patient during her breast biopsy.  Offered support.  Patient was going to leave, but with much coaxing she decided to stay and have the procedure.  Informed her one of the navigators or Dr. Baruch Gouty would call her with her results.              Time Spent with Patient: 90 (09/15/15 1100)

## 2015-09-17 ENCOUNTER — Telehealth: Payer: Self-pay

## 2015-09-17 NOTE — Telephone Encounter (Signed)
Patient called our office stating that she was returning our phone call. However, I asked Amber-RN if she had called her and she stated that she had not. I also looked into patient's chart to see if there was any phone messages and didn't see anything. I told patient that I would contact her Breast navigator and ask if she was the one trying to locate patient. I told patient that I would investigate and that I would call her back with an answer. Patient agreed.  Called Sheena one of the breast navigators and asked if she or Lelon Frohlich have been trying to get in touch with patient. Vita Barley stated that Lelon Frohlich had already spoken to patient in reference to three upcoming appointments that she has on 09/22/2015. However, she was not sure if Lelon Frohlich have been calling the patient. I told Vita Barley that I would call patient back to let her know about her appointments. Sheena agreed.  I then called patient back and told her that we were not sure if Lelon Frohlich was trying to get in touch with her again or not. Patient asked me for Ann's phone number to call her and ask if she was needing from her. So I gave patient Ann's phone number [phone]).

## 2015-09-17 NOTE — Telephone Encounter (Signed)
Phoned patient this afternoon, and discussed biopsy results of invasive mammary carcinoma.  Patient states she is not sure if she wants to see Medical Oncologist, but encouraged her to see for treatment plan. Scheduled appointment with [name] for Monday 09/22/15 at 1:15.  Reminded patient of previously scheduled appointment for CT at 11:00, and with Dr. Azalee Course at [facility] at 3:00.   [name] to transport patient to and from appointments. Instructed patient to call back with any questions. Patient called and left message she would like for me to call back after 4:00 p.m.  Phoned patient. She requested I give pathology information to her friend Micheal Likens .  Spoke to [place] who is planning to accompany patient to appointment on Monday.    Oncology Nurse Navigator Documentation  Navigator Location: [facility] (09/17/15 1700) Navigator Encounter Type: Telephone (09/17/15 1700) Telephone: Incoming Call;Outgoing Call;Education;Appt Confirmation/Clarification;Diagnostic Results (09/17/15 1700) Abnormal Finding Date: 09/10/15 (09/17/15 1700) Confirmed Diagnosis Date: 09/17/15 (09/17/15 1700)   Treatment Initiated Date: 09/22/15 (09/17/15 1700) Patient Visit Type: MedOnc;Surgery (09/17/15 1700)   Barriers/Navigation Needs: Transportation;Education;Coordination of Care (09/17/15 1700) Education: Newly Diagnosed Cancer Education;Coping with Diagnosis/ Prognosis;Understanding Cancer/ Treatment Options;Accessing Care/ Finding Providers (09/17/15 1700) Interventions: Coordination of Care;Education Method (09/17/15 1700)            Acuity: Level 4 (09/17/15 1700)       Acuity Level 4: Coordination of Multimodality treatment;Low health literacy;Ongoing guidance, education and support provided throughout the patient's treatment;Psychological issues (09/17/15 1700) Time Spent with Patient: 45 (09/17/15 1700)

## 2015-09-22 ENCOUNTER — Encounter: Payer: Self-pay | Admitting: Diagnostic Radiology

## 2015-09-22 ENCOUNTER — Telehealth: Payer: Self-pay

## 2015-09-22 ENCOUNTER — Inpatient Hospital Stay: Payer: Medicare Other | Attending: Internal Medicine | Admitting: Internal Medicine

## 2015-09-22 ENCOUNTER — Encounter: Payer: Self-pay | Admitting: Internal Medicine

## 2015-09-22 ENCOUNTER — Ambulatory Visit
Admission: RE | Admit: 2015-09-22 | Discharge: 2015-09-22 | Disposition: A | Discharge status: Home / Self Care | Payer: Medicare Other | Source: Ambulatory Visit | Attending: Radiation Oncology | Admitting: Radiation Oncology

## 2015-09-22 ENCOUNTER — Ambulatory Visit: Payer: Medicare Other | Admitting: Surgery

## 2015-09-22 ENCOUNTER — Encounter: Payer: Self-pay | Admitting: *Deleted

## 2015-09-22 DIAGNOSIS — Z923 Personal history of irradiation: Secondary | ICD-10-CM | POA: Insufficient documentation

## 2015-09-22 DIAGNOSIS — G40909 Epilepsy, unspecified, not intractable, without status epilepticus: Secondary | ICD-10-CM | POA: Insufficient documentation

## 2015-09-22 DIAGNOSIS — C50411 Malignant neoplasm of upper-outer quadrant of right female breast: Secondary | ICD-10-CM | POA: Insufficient documentation

## 2015-09-22 DIAGNOSIS — R911 Solitary pulmonary nodule: Secondary | ICD-10-CM | POA: Diagnosis not present

## 2015-09-22 DIAGNOSIS — Z7901 Long term (current) use of anticoagulants: Secondary | ICD-10-CM | POA: Diagnosis not present

## 2015-09-22 DIAGNOSIS — E278 Other specified disorders of adrenal gland: Secondary | ICD-10-CM | POA: Insufficient documentation

## 2015-09-22 DIAGNOSIS — Z85118 Personal history of other malignant neoplasm of bronchus and lung: Secondary | ICD-10-CM | POA: Diagnosis not present

## 2015-09-22 DIAGNOSIS — I2510 Atherosclerotic heart disease of native coronary artery without angina pectoris: Secondary | ICD-10-CM | POA: Diagnosis not present

## 2015-09-22 DIAGNOSIS — Z87891 Personal history of nicotine dependence: Secondary | ICD-10-CM | POA: Insufficient documentation

## 2015-09-22 DIAGNOSIS — Z86718 Personal history of other venous thrombosis and embolism: Secondary | ICD-10-CM | POA: Diagnosis not present

## 2015-09-22 DIAGNOSIS — Z79899 Other long term (current) drug therapy: Secondary | ICD-10-CM | POA: Insufficient documentation

## 2015-09-22 DIAGNOSIS — F17210 Nicotine dependence, cigarettes, uncomplicated: Secondary | ICD-10-CM | POA: Insufficient documentation

## 2015-09-22 DIAGNOSIS — Z171 Estrogen receptor negative status [ER-]: Secondary | ICD-10-CM | POA: Insufficient documentation

## 2015-09-22 DIAGNOSIS — E079 Disorder of thyroid, unspecified: Secondary | ICD-10-CM | POA: Insufficient documentation

## 2015-09-22 DIAGNOSIS — I252 Old myocardial infarction: Secondary | ICD-10-CM | POA: Diagnosis not present

## 2015-09-22 DIAGNOSIS — E785 Hyperlipidemia, unspecified: Secondary | ICD-10-CM | POA: Diagnosis not present

## 2015-09-22 DIAGNOSIS — I10 Essential (primary) hypertension: Secondary | ICD-10-CM | POA: Diagnosis not present

## 2015-09-22 LAB — POCT I-STAT CREATININE: CREATININE: 1.1 mg/dL — AB (ref 0.44–1.00)

## 2015-09-22 LAB — SURGICAL PATHOLOGY

## 2015-09-22 MED ORDER — IOHEXOL 300 MG/ML  SOLN
75.0000 mL | Freq: Once | INTRAMUSCULAR | Status: AC | PRN
  Administered 2015-09-22: 75 mL via INTRAVENOUS

## 2015-09-22 NOTE — Progress Notes (Signed)
  Oncology Nurse Navigator Documentation  Navigator Location: [facility] (09/22/15 1600) Navigator Encounter Type: Initial MedOnc (09/22/15 1600)           Patient Visit Type: MedOnc (09/22/15 1600)   Barriers/Navigation Needs: Transportation (09/22/15 1600) Education: Newly Diagnosed Cancer Education (09/22/15 1600) Interventions: Coordination of Care (09/22/15 1600)   Coordination of Care: Other (surgery) (09/22/15 1600)        Acuity: Level 4 (09/22/15 1600)    Met with patient during her medical oncology visit with Dr. Yevette Edwards.  Offered support.  Took patient to meet with Dr. Azalee Course for her surgical consult.  She rescheduled after waiting about 40 minutes since Dr. Azalee Course was still in surgery and she uses the cancer center [name] for transportation.  She has been rescheduled for 10/02/15 @ 9:45.  She is to call if she has any questions or needs.     Time Spent with Patient: > 120 (09/22/15 1600)

## 2015-09-22 NOTE — Telephone Encounter (Signed)
Patient arrived for appointment with Tanya Nones, RN from [facility]. Patient's weight was recorded from previous visit at [facility] today and placed in patient room. Shortly after placing patient in room, she became concerned because she needed to be home by 4pm to have Oil delivered to heat her house as she would not have heat tonight if she did not get there in time. Call was made to operating room and Dr. Azalee Course was approximately 30-45 minutes away from being done which would have made the patient late for her delivery. She decided to reschedule and was placed on schedule for 10/02/15 at 0945 in [facility] with Dr. Azalee Course. RN Vita Barley wrote down appointment information and will get transportation van arranged for this patient. I apologized on multiple occasions for the delay. The patient was not upset with the circumstances at all.

## 2015-09-22 NOTE — Progress Notes (Signed)
Pt here to discuss breast bx for breast cancer. She states that where she had bx her breast hurts esp. On movement like cleaning her house and cooking dinner.

## 2015-09-22 NOTE — Progress Notes (Signed)
Chaperoned provider with Breast Exam

## 2015-09-22 NOTE — Progress Notes (Signed)
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   # Smoker; ? Paranoia   INTERVAL HISTORY:  This is my first interaction with the patient since I joined the practice September 2016. I reviewed the patient's prior charts/pertinent labs/imaging in detail; findings are summarized above.   65 year old female patient with a history of newly diagnosed breast cancer; and also stage I lung cancer status post radiation is here to discuss the treatment options for breast cancer. She patient is very fearful/paranoid- not wanting to talk regarding the breast cancer.  Patient states that she felt a lump in the breast few weeks ago- that was followed up with an mammogram and ultrasound of the breast- that showed 7-8 cm breast mass and also axillary lymph node. The breast mass positive for invasive ductal carcinoma; axillary lymph node negative for malignancy. She was treated to discuss further treatment options.  Patient denies any headaches. Denies any chest pain or shortness of breath or cough. She denies any Nausea vomiting tingling or numbness. Denies any weight loss.  REVIEW OF SYSTEMS:  A complete 10 point review of system is done which is negative except mentioned above/history of present illness.   PAST MEDICAL HISTORY :  Past Medical History  Diagnosis Date  . Hypertension   . DVT (deep venous thrombosis) (HCC)     LEFT LEG   . MI (myocardial infarction) ([place])   . Diabetes mellitus without complication ([place])   . Seizure disorder ([place])   . Coronary artery disease     a.  NSTEMI cath 08/02/14: LM nl, pLAD 50%, mLCx 30%, mRCA 95% s/p PCI/DES, 2nd lesion 40%, EF 55%  . HLD (hyperlipidemia)   . Lung nodule   . Anxiety   . Seizures ([place])   . Squamous cell lung cancer ([place]) 04/23/2015    PAST SURGICAL HISTORY :   Past Surgical History  Procedure Laterality Date  . Abdominal hysterectomy      PARTIAL   . Leg surgery Right     BLOOD CLOT  . Cardiac catheterization  08/02/2014  . Coronary angioplasty  08/02/2014    drug eluting stent placement  . Ivc filter placement ([facility] hx)      FAMILY HISTORY :   Family History  Problem Relation Age of Onset  . Heart attack Mother   . Breast cancer Neg Hx     SOCIAL HISTORY:   Social History  Substance Use Topics  . Smoking status: Current Every Day Smoker -- 1.00 packs/day for 45 years    Types: Cigarettes  . Smokeless tobacco: Never Used  . Alcohol Use: No    ALLERGIES:  is allergic to penicillins.  MEDICATIONS:  Current Outpatient Prescriptions  Medication Sig Dispense Refill  . diazepam (VALIUM) 5 MG tablet Take 1 tablet by mouth 2 (two) times daily as needed.    . empagliflozin (JARDIANCE) 25 MG TABS tablet Take 25 mg by mouth daily.    Marland Kitchen HYDROcodone-acetaminophen (NORCO/VICODIN) 5-325 MG tablet Take 1 tablet by mouth every 4 (four) hours  as needed for moderate pain. 20 tablet 0  . lisinopril-hydrochlorothiazide (PRINZIDE,ZESTORETIC) 20-25 MG per tablet Take 1 tablet by mouth daily.     . naproxen (NAPROSYN) 375 MG tablet Take 375 mg by mouth 2 (two) times daily with a meal.    . nitroGLYCERIN (NITROSTAT) 0.4 MG SL tablet Place 0.4 mg under the tongue every 5 (five) minutes as needed for chest pain.    . phenytoin (DILANTIN) 100 MG ER capsule Take 300 mg by mouth daily.     Alveda Reasons 10 MG TABS tablet Take 1 tablet by mouth every evening.    . pioglitazone (ACTOS) 45 MG tablet Take 1 tablet by mouth daily.     No current facility-administered medications for this visit.   Facility-Administered  Medications Ordered in Other Visits  Medication Dose Route Frequency Provider Last Rate Last Dose  . albuterol (PROVENTIL) (2.5 MG/3ML) 0.083% nebulizer solution 2.5 mg  2.5 mg Nebulization Once Nestor Lewandowsky, MD        PHYSICAL EXAMINATION: ECOG PERFORMANCE STATUS: 0 - Asymptomatic  There were no vitals taken for this visit.  There were no vitals filed for this visit.  GENERAL: Well-nourished well-developed; Alert, no distress and comfortable.  Alone;  EYES: no pallor or icterus OROPHARYNX: no thrush or ulceration; good dentition  NECK: supple, no masses felt LYMPH:  no palpable lymphadenopathy in the cervical, axillary or inguinal regions LUNGS: clear to auscultation and  No wheeze or crackles HEART/CVS: regular rate & rhythm and no murmurs; No lower extremity edema ABDOMEN:abdomen soft, non-tender and normal bowel sounds Musculoskeletal:no cyanosis of digits and no clubbing  PSYCH: alert & oriented x 3 with fluent speech NEURO: no focal motor/sensory deficits SKIN:  no rashes or significant lesions  RIGHT BREAST: Ecchymosis from the biopsy noted. No skin dimpling/erythema warmth noted. No nipple changes. She is mobile 78 cm mass in the upper outer quadrant. Mobile; no tender.   LABORATORY DATA:  I have reviewed the data as listed    Component Value Date/Time   NA 136 03/03/2015 0443   NA 141 08/03/2014 0050   K 3.5 03/03/2015 0443   K 3.3* 08/03/2014 0050   CL 103 03/03/2015 0443   CL 108* 08/03/2014 0050   CO2 26 03/03/2015 0443   CO2 27 08/03/2014 0050   GLUCOSE 162* 03/03/2015 0443   GLUCOSE 164* 08/03/2014 0050   BUN 24* 03/03/2015 0443   BUN 14 08/03/2014 0050   CREATININE 1.10* 09/22/2015 1107   CREATININE 0.68 08/03/2014 0050   CALCIUM 8.3* 03/03/2015 0443   CALCIUM 8.2* 08/03/2014 0050   PROT 7.0 03/02/2015 1554   PROT 6.7 04/11/2013 2241   ALBUMIN 3.4* 03/02/2015 1554   ALBUMIN 2.7* 04/11/2013 2241   AST 21 03/02/2015 1554   AST 13* 04/11/2013 2241    ALT 23 03/02/2015 1554   ALT 16 04/11/2013 2241   ALKPHOS 174* 03/02/2015 1554   ALKPHOS 207* 04/11/2013 2241   BILITOT 0.3 03/02/2015 1554   BILITOT 0.1* 04/11/2013 2241   GFRNONAA >60 03/03/2015 0443   GFRNONAA >60 08/03/2014 0050   GFRNONAA >60 04/11/2013 2241   GFRAA >60 03/03/2015 0443   GFRAA >60 08/03/2014 0050   GFRAA >60 04/11/2013 2241    No results found for: SPEP, UPEP  Lab Results  Component Value Date   WBC 7.1 04/23/2015   NEUTROABS 12.1* 02/27/2015   HGB 13.4 04/23/2015   HCT 41.4 04/23/2015   MCV 83.0 04/23/2015   PLT 171 04/23/2015  Chemistry      Component Value Date/Time   NA 136 03/03/2015 0443   NA 141 08/03/2014 0050   K 3.5 03/03/2015 0443   K 3.3* 08/03/2014 0050   CL 103 03/03/2015 0443   CL 108* 08/03/2014 0050   CO2 26 03/03/2015 0443   CO2 27 08/03/2014 0050   BUN 24* 03/03/2015 0443   BUN 14 08/03/2014 0050   CREATININE 1.10* 09/22/2015 1107   CREATININE 0.68 08/03/2014 0050      Component Value Date/Time   CALCIUM 8.3* 03/03/2015 0443   CALCIUM 8.2* 08/03/2014 0050   ALKPHOS 174* 03/02/2015 1554   ALKPHOS 207* 04/11/2013 2241   AST 21 03/02/2015 1554   AST 13* 04/11/2013 2241   ALT 23 03/02/2015 1554   ALT 16 04/11/2013 2241   BILITOT 0.3 03/02/2015 1554   BILITOT 0.1* 04/11/2013 2241       RADIOGRAPHIC STUDIES: I have personally reviewed the radiological images as listed and agreed with the findings in the report. Ct Chest W Contrast  09/22/2015  CLINICAL DATA:  Staging left lung cancer, recently finished radiation treatment. Smoking history. EXAM: CT CHEST WITH CONTRAST TECHNIQUE: Multidetector CT imaging of the chest was performed during intravenous contrast administration. CONTRAST:  35m OMNIPAQUE IOHEXOL 300 MG/ML  SOLN COMPARISON:  None. FINDINGS: Mediastinum/Lymph Nodes: Stable mild enlargement and heterogeneity of the thyroid gland. No masses or enlarged lymph nodes within the mediastinum or perihilar regions. No  enlarged lymph nodes within the axillary regions. Scattered atherosclerotic changes again seen along the walls of the normal-caliber thoracic aorta. Heart size is upper normal. No pericardial effusion. Coronary artery calcifications noted. Lungs/Pleura: Overall extent of patient's irregular left upper lobe nodule is unchanged, measuring 16 x 9 x 6 mm, but is decreased in density and overall fullness. No new nodules within either lung. No pleural effusion. Trachea and central bronchi are unremarkable. Upper abdomen: Stable bilateral adrenal enlargement, likely hyperplasia. Limited images of the upper abdomen otherwise unremarkable. Musculoskeletal: No chest wall mass or suspicious bone lesions identified. Scattered degenerative changes throughout the thoracic spine. IMPRESSION: 1. Left upper lobe nodule stable in extent, measuring 16 x 9 x 6 mm (long axis by transverse by craniocaudal dimensions respectively) but slightly decreased in density and overall fullness compared to the previous chest CT of 03/05/2015. No new lung findings. 2. No evidence of metastatic disease within the chest. 3. No evidence of osseous metastasis. 4. Stable bilateral adrenal enlargement, likely hyperplasia. Electronically Signed   By: SFranki CabotM.D.   On: 09/22/2015 12:38     ASSESSMENT & PLAN:   # Breast Ca- ER/PR- Neg; her2-NEG. T3N0- STAGE II; patient has a large primary breast malignancy [7-8 cm in size]; triple negative. Ideally, it is  recommended neoadjuvant chemotherapy followed by surgery/possibly radiation. However given patient's reluctance to proceed with treatments/and are state of mind I think- offering definitive treatment- like surgery upfront would be most reasonable. I think offer neoadjuvant chemotherapy would be difficult- given her psychiatric issues.   Given the triple-negative nature of the disease; she will likely benefit from adjuvant chemotherapy; and also is on the size/lymph node status- radiation  might also be necessary.  I'll also recommend PET scan.   # I have left a message for Dr. MCarrie Mewto discuss the above plan.  # Lung ca STAGE I Squamous cell s/p RT- CT- JAN 2017- No evidence of recurrence.  # Possible thyroid malignancy- this is to be followed up later on especially the context  of her newly diagnosed breast cancer.  # Smoking- given her reluctance/ I didn't think with was appropriate to discuss smoking cessation at this time.     Cammie Sickle, MD 09/22/2015 2:48 PM

## 2015-09-25 ENCOUNTER — Ambulatory Visit: Payer: Medicare Other | Admitting: Internal Medicine

## 2015-09-30 ENCOUNTER — Encounter: Payer: Self-pay | Admitting: Surgery

## 2015-10-01 ENCOUNTER — Telehealth: Payer: Self-pay | Admitting: *Deleted

## 2015-10-01 NOTE — Telephone Encounter (Signed)
Talked with patient earlier today to inform her that the [name] would pick her up at 9:00 for her 9:45 appointment tomorrow.  She is very hesitant about coming.  States a friend has told her horror stories about treatment and that she doesn't want to come tomorrow.  States "I'm a grown women and I don't have to do this".  We had about a 10 minute conversation.  I did inform her that she needs to be able to make a good informed decision about her care and that she should at least listen to what Dr. Azalee Course has to say tomorrow.  She asked that I call her back this afternoon so we could talk longer since I was in the clinic during our conversation.  I have left 2 messages for her this afternoon.

## 2015-10-02 ENCOUNTER — Ambulatory Visit (INDEPENDENT_AMBULATORY_CARE_PROVIDER_SITE_OTHER): Payer: Medicare Other | Admitting: Surgery

## 2015-10-02 ENCOUNTER — Encounter: Payer: Self-pay | Admitting: *Deleted

## 2015-10-02 ENCOUNTER — Encounter: Payer: Self-pay | Admitting: Surgery

## 2015-10-02 VITALS — Wt 208.0 lb

## 2015-10-02 DIAGNOSIS — N6310 Unspecified lump in the right breast, unspecified quadrant: Secondary | ICD-10-CM

## 2015-10-02 DIAGNOSIS — N63 Unspecified lump in breast: Secondary | ICD-10-CM | POA: Diagnosis not present

## 2015-10-02 NOTE — Progress Notes (Signed)
  Oncology Nurse Navigator Documentation  Navigator Location: [facility] (10/02/15 1100) Navigator Encounter Type: Other (surgical appt) (10/02/15 1100)           Patient Visit Type: Surgery (10/02/15 1100)   Barriers/Navigation Needs: Transportation;Coordination of Care;Financial (10/02/15 1100) Education: Preparing for Upcoming Surgery/ Treatment (10/02/15 1100) Interventions: Coordination of Care (10/02/15 1100)   Coordination of Care: Appts (10/02/15 1100)        Acuity: Level 4 (10/02/15 1100)         Time Spent with Patient: 120 (10/02/15 1100)   Met patient today for initial surgical consultation.  Offered support and education.  Assisted with coordination of care and appointments.  Scheduled transportation for next appointment for pre-op.  [name] to pick her up at 9:45 for her 10:30 appointment.

## 2015-10-02 NOTE — Progress Notes (Signed)
Subjective:     Patient ID: Dawn Allen, female   DOB: 1951-02-25, 65 y.o.   MRN: [id number]  HPI   65 year old female who comes in today to evaluate for right breast mass as well as her right thyroid mass. Patient does have a recent history of left lung non-small cell carcinoma for which she underwent radiation. About 3 weeks ago the patient had discovered this mass in her right breast , and spoke to one of our nurse navigators. She has since had mammography showing a 5 cm lesion was suspicious  Associated microcalcifications extending to about 10 cm in size. 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 Also had a right axillary lymph node that was biopsied which was negative for malignancy. She denies any changes in the skin of her breast or any drainage. Patient denies anyone in her family having a history of cancer. Patient has never had any previous issues on mammograms and has never had any prior biopsies.   Patient is very concerned about the complications associated with chemotherapy as well is scared of losing her entire breast.  Past Medical History  Diagnosis Date  . Hypertension   . DVT (deep venous thrombosis) (HCC)     LEFT LEG   . MI (myocardial infarction) ([place])   . Diabetes mellitus without complication ([place])   . Seizure disorder ([place])   . Coronary artery disease     a. NSTEMI cath 08/02/14: LM nl, pLAD 50%, mLCx 30%, mRCA 95% s/p PCI/DES, 2nd lesion 40%, EF 55%  . HLD (hyperlipidemia)   . Lung nodule   . Anxiety   . Seizures ([place])   . Squamous cell lung cancer ([place]) 04/23/2015   Past Surgical History  Procedure Laterality Date  . Abdominal hysterectomy      PARTIAL   . Leg surgery Right     BLOOD CLOT  . Cardiac catheterization  08/02/2014  . Coronary angioplasty  08/02/2014    drug eluting stent placement  . Ivc filter placement ([facility] hx)     Family History  Problem  Relation Age of Onset  . Heart attack Mother   . Breast cancer Neg Hx    Social History   Social History  . Marital Status: Single    Spouse Name: N/A  . Number of Children: N/A  . Years of Education: N/A   Social History Main Topics  . Smoking status: Current Every Day Smoker -- 1.00 packs/day for 45 years    Types: Cigarettes  . Smokeless tobacco: Never Used  . Alcohol Use: No  . Drug Use: No  . Sexual Activity:    Partners: Male   Other Topics Concern  . None   Social History Narrative    Current outpatient prescriptions:  .  atorvastatin (LIPITOR) 40 MG tablet, Take 1 tablet by mouth 1 day or 1 dose., Disp: , Rfl:  .  diazepam (VALIUM) 5 MG tablet, Take 1 tablet by mouth 2 (two) times daily as needed., Disp: , Rfl:  .  empagliflozin (JARDIANCE) 25 MG TABS tablet, Take 25 mg by mouth daily., Disp: , Rfl:  .  HYDROcodone-acetaminophen (NORCO/VICODIN) 5-325 MG tablet, Take 1 tablet by mouth every 4 (four) hours as needed for moderate pain., Disp: 20 tablet, Rfl: 0 .  lisinopril-hydrochlorothiazide (PRINZIDE,ZESTORETIC) 20-25 MG per tablet, Take 1 tablet by mouth daily. , Disp: , Rfl:  .  metFORMIN (GLUCOPHAGE)  1000 MG tablet, Take 1 tablet by mouth 2 (two) times daily., Disp: , Rfl:  .  naproxen (NAPROSYN) 500 MG tablet, Take 1 tablet by mouth 2 (two) times daily., Disp: , Rfl:  .  nitroGLYCERIN (NITROSTAT) 0.4 MG SL tablet, Place 0.4 mg under the tongue every 5 (five) minutes as needed for chest pain., Disp: , Rfl:  .  phenytoin (DILANTIN) 100 MG ER capsule, Take 300 mg by mouth daily. , Disp: , Rfl:  .  pioglitazone (ACTOS) 45 MG tablet, Take 1 tablet by mouth daily., Disp: , Rfl:  .  XARELTO 10 MG TABS tablet, Take 1 tablet by mouth every evening., Disp: , Rfl:  .  traMADol-acetaminophen (ULTRACET) 37.5-325 MG tablet, , Disp: , Rfl:  No current facility-administered medications for this visit.  Facility-Administered Medications Ordered in Other Visits:  .  albuterol  (PROVENTIL) (2.5 MG/3ML) 0.083% nebulizer solution 2.5 mg, 2.5 mg, Nebulization, Once, Nestor Lewandowsky, MD Allergies  Allergen Reactions  . Penicillins     Patient denies allergy       Review of Systems  Constitutional: Negative for fever, chills, activity change, appetite change and fatigue.  HENT: Negative for congestion and sore throat.   Respiratory: Negative for cough, chest tightness and wheezing.   Cardiovascular: Negative for chest pain, palpitations and leg swelling.  Gastrointestinal: Negative for diarrhea and abdominal distention.  Genitourinary: Negative for dysuria.  Musculoskeletal: Negative for back pain.  Skin: Negative for color change, pallor, rash and wound.  Neurological: Negative for dizziness and weakness.  Hematological: Negative for adenopathy. Does not bruise/bleed easily.  Psychiatric/Behavioral: Positive for decreased concentration and agitation. The patient is nervous/anxious.   All other systems reviewed and are negative.     There were no vitals filed for this visit.  Objective:   Physical Exam  Constitutional: She is oriented to person, place, and time. She appears well-developed and well-nourished. No distress.  HENT:  Head: Normocephalic and atraumatic.  Nose: Nose normal.  Mouth/Throat: Oropharynx is clear and moist. No oropharyngeal exudate.  Eyes: Conjunctivae and EOM are normal. Pupils are equal, round, and reactive to light. No scleral icterus.  Neck: Normal range of motion. Neck supple. No tracheal deviation present.  Cardiovascular: Normal rate, regular rhythm, normal heart sounds and intact distal pulses.  Exam reveals no gallop and no friction rub.   No murmur heard. Pulmonary/Chest: Effort normal and breath sounds normal. No respiratory distress. She has no wheezes. She has no rales.  Abdominal: Soft. Bowel sounds are normal. She exhibits no distension. There is no rebound.  Musculoskeletal: Normal range of motion. She exhibits no edema  or tenderness.  Neurological: She is alert and oriented to person, place, and time. No cranial nerve deficit.  Skin: Skin is warm and dry. No rash noted. No erythema. No pallor.  Psychiatric: Judgment and thought content normal.  Vitals reviewed. Breast Exam:  Right breast: Large approximately 8cm mass, mobile, in the upper outer quadrant.  No other masses palpable, no lymphadenopathy Left breast: no masses palpabale, no skin changes, nipple retract or lymphadenopathy     Assessment:     65 yr old with Right breast cancer     Plan:      I had a lengthy discussion with the patient concerning her diagnoses of cancer. I discussed with her that with her thyroid cancer is usually slow growing and we can put on on the back burner for now. I also discussed with her that given the size of her  breast mass that she had a couple of options available to her. The first option would be to go on and have a mastectomy , along with sampling the lymph nodes and if any of those were positive then she would need chemotherapy and possibly radiation after that. I also discussed with her that the other option would be a start chemotherapy first see if we could shrink the size of the tumor , and then potentially do a lumpectomy after that time. Patient was very concerned about having to have her whole breast removed and would like to to try chemotherapy first.   I discussed with her the risks benefits, alternatives and expected outcomes of having a port placement, including  Bleeding, infection, the for removal of the port , pneumothorax , as well as anesthetic risk of stroke heart attacks and blood clot.   She is on Xareto for prior DVTs and I will have her stop this 3 days prior  To her port placement on February 20.   I discussed with her today that her last dose of Xarelto would be on February 16.   She will have her preoperative appointment on February 14.   I also discussed with Dr. Diamantina Monks,  Her oncologist,  The  option that she had decided on. And he will likely start chemotherapy on February 21.   I spent  60 minutes with at least 50% of that time on patient on education, decision making, counseling and coordination of care.

## 2015-10-02 NOTE — Addendum Note (Signed)
Addended by: Hubbard Robinson on: 10/02/2015 11:50 AM   Modules accepted: Orders

## 2015-10-03 ENCOUNTER — Telehealth: Payer: Self-pay | Admitting: Surgery

## 2015-10-03 NOTE — Telephone Encounter (Signed)
Tanya Nones has called back we have discussed the surgery date and pre op date and time. She stated that she will make the patient aware of all information and also make sure transportation is set up for both dates.

## 2015-10-03 NOTE — Telephone Encounter (Signed)
I have called Tanya Nones at the cancer center to advise her of the patients surgery date and pre op date and time. No answer. I have left her a message on voicemail.   Sx: 10/13/15 with Dr Lina Sayre Placement. Pre op: 10/07/15 @ 10:30am--Office.

## 2015-10-06 ENCOUNTER — Ambulatory Visit
Admission: RE | Admit: 2015-10-06 | Discharge: 2015-10-06 | Disposition: A | Discharge status: Home / Self Care | Payer: Medicare Other | Source: Ambulatory Visit | Attending: Radiation Oncology | Admitting: Radiation Oncology

## 2015-10-06 ENCOUNTER — Inpatient Hospital Stay: Payer: Medicare Other

## 2015-10-06 ENCOUNTER — Encounter: Payer: Self-pay | Admitting: Internal Medicine

## 2015-10-06 ENCOUNTER — Encounter: Payer: Self-pay | Admitting: Radiation Oncology

## 2015-10-06 ENCOUNTER — Inpatient Hospital Stay: Payer: Medicare Other | Attending: Internal Medicine | Admitting: Internal Medicine

## 2015-10-06 ENCOUNTER — Other Ambulatory Visit: Payer: Self-pay | Admitting: *Deleted

## 2015-10-06 VITALS — BP 150/84 | HR 86 | Temp 96.9°F | Resp 18

## 2015-10-06 DIAGNOSIS — Z171 Estrogen receptor negative status [ER-]: Secondary | ICD-10-CM | POA: Diagnosis not present

## 2015-10-06 DIAGNOSIS — M79602 Pain in left arm: Secondary | ICD-10-CM | POA: Diagnosis not present

## 2015-10-06 DIAGNOSIS — E785 Hyperlipidemia, unspecified: Secondary | ICD-10-CM | POA: Insufficient documentation

## 2015-10-06 DIAGNOSIS — Z923 Personal history of irradiation: Secondary | ICD-10-CM | POA: Insufficient documentation

## 2015-10-06 DIAGNOSIS — X58XXXS Exposure to other specified factors, sequela: Secondary | ICD-10-CM | POA: Insufficient documentation

## 2015-10-06 DIAGNOSIS — Z79899 Other long term (current) drug therapy: Secondary | ICD-10-CM | POA: Diagnosis not present

## 2015-10-06 DIAGNOSIS — Z86718 Personal history of other venous thrombosis and embolism: Secondary | ICD-10-CM | POA: Diagnosis not present

## 2015-10-06 DIAGNOSIS — C50411 Malignant neoplasm of upper-outer quadrant of right female breast: Secondary | ICD-10-CM

## 2015-10-06 DIAGNOSIS — Z85118 Personal history of other malignant neoplasm of bronchus and lung: Secondary | ICD-10-CM | POA: Diagnosis not present

## 2015-10-06 DIAGNOSIS — C3492 Malignant neoplasm of unspecified part of left bronchus or lung: Secondary | ICD-10-CM

## 2015-10-06 DIAGNOSIS — E119 Type 2 diabetes mellitus without complications: Secondary | ICD-10-CM | POA: Insufficient documentation

## 2015-10-06 DIAGNOSIS — I10 Essential (primary) hypertension: Secondary | ICD-10-CM | POA: Diagnosis not present

## 2015-10-06 DIAGNOSIS — Z9861 Coronary angioplasty status: Secondary | ICD-10-CM | POA: Insufficient documentation

## 2015-10-06 DIAGNOSIS — T85848S Pain due to other internal prosthetic devices, implants and grafts, sequela: Secondary | ICD-10-CM | POA: Diagnosis not present

## 2015-10-06 DIAGNOSIS — G40909 Epilepsy, unspecified, not intractable, without status epilepticus: Secondary | ICD-10-CM | POA: Insufficient documentation

## 2015-10-06 DIAGNOSIS — F17210 Nicotine dependence, cigarettes, uncomplicated: Secondary | ICD-10-CM | POA: Diagnosis not present

## 2015-10-06 DIAGNOSIS — C50811 Malignant neoplasm of overlapping sites of right female breast: Secondary | ICD-10-CM

## 2015-10-06 DIAGNOSIS — Z7901 Long term (current) use of anticoagulants: Secondary | ICD-10-CM | POA: Diagnosis not present

## 2015-10-06 DIAGNOSIS — I252 Old myocardial infarction: Secondary | ICD-10-CM | POA: Diagnosis not present

## 2015-10-06 DIAGNOSIS — Z7984 Long term (current) use of oral hypoglycemic drugs: Secondary | ICD-10-CM | POA: Diagnosis not present

## 2015-10-06 DIAGNOSIS — Z5111 Encounter for antineoplastic chemotherapy: Secondary | ICD-10-CM | POA: Diagnosis present

## 2015-10-06 DIAGNOSIS — I2510 Atherosclerotic heart disease of native coronary artery without angina pectoris: Secondary | ICD-10-CM | POA: Diagnosis not present

## 2015-10-06 DIAGNOSIS — E079 Disorder of thyroid, unspecified: Secondary | ICD-10-CM | POA: Insufficient documentation

## 2015-10-06 MED ORDER — PROCHLORPERAZINE MALEATE 10 MG PO TABS: 10.0000 mg | ORAL_TABLET | Freq: Four times a day (QID) | ORAL | Status: DC | PRN

## 2015-10-06 MED ORDER — HYDROCODONE-ACETAMINOPHEN 5-325 MG PO TABS: 1.0000 | ORAL_TABLET | ORAL | Status: DC | PRN

## 2015-10-06 MED ORDER — ONDANSETRON HCL 8 MG PO TABS: 8.0000 mg | ORAL_TABLET | Freq: Three times a day (TID) | ORAL | Status: DC | PRN

## 2015-10-06 MED ORDER — LIDOCAINE-PRILOCAINE 2.5-2.5 % EX CREA: 1.0000 "application " | TOPICAL_CREAM | CUTANEOUS | Status: DC | PRN

## 2015-10-06 NOTE — Progress Notes (Signed)
RN chaperoned provider today's visit/examination  Pt lacks family/social support system. "I refuse to let anyone know that I have cancer." She declines at this time to meet with counselors or chaplin. Emotional support provided at today's visit. She was instructed on the treatment plan (weekly carbo/taxol), the chemotherapy class, the purpose of the pet scan, muga scan and port placement. I instructed her that MD has prescribed EMLA cream to numb the port a cath site on the days of her treatment. I explained to the patient that she will need to apply the cream approximately 30 mins. to 1 hour before the port is accessed on day of treatment.  Her port a cath will be placed on Monday 2/201/7 under the care of Dr. Azalee Course. Vita Barley, RN provided the patient with written instructions for her surgery date and preop dates.  Teach back process performed with patient. Approximately 20 minutes spent by RN face to face providing the patient with a generalized overview for the plan. Pt stated that "was anxious" and expressed "being overwhelmed with all information." RN to reinforce teaching as needed.

## 2015-10-06 NOTE — Patient Instructions (Addendum)
PET Scan A PET scan, also called positron emission tomography, is a test that creates pictures of the inside of the body. A PET scan requires a small dose of a harmless radioactive material to be injected into a vein. When this material combines with certain substances in the body, it produces tiny particles that can be detected by a scanner and converted into pictures.  The pictures created during a PET scan can be used to study a disease. They are often used to study cancer and cancer therapy. The colors and brightness on the pictures show different levels of organ and tissue function. For example, cancer tissue appears brighter than normal tissue on a PET scan picture. LET [facility] CARE PROVIDER KNOW ABOUT:   Any allergies you have.  All medicines you are taking, including vitamins, herbs, eye drops, creams, and over-the-counter medicines.  Previous problems you or members of your family have had with the use of anesthetics.  Any blood disorders you have.  Previous surgeries you have had.  Medical conditions you have.  If you are afraid of cramped spaces (claustrophobic). If claustrophobia is a problem, it usually can be relieved with mild sedatives or antianxiety medicines.  If you have trouble staying still for long periods of time. BEFORE THE PROCEDURE   Do not eat or drink anything after midnight on the night before the procedure or as directed by your health care provider.  Take medicines only as directed by your health care provider.  If you have diabetes, ask your health care provider for diet guidelines to control sugar (glucose) levels on the day of the test. PROCEDURE   A small amount of radioactive material will be injected into a vein. The test will begin 30-60 minutes after the injection, when the material has traveled around your body.  You will lie on a cushioned table, and the table will be moved through the center of a machine that looks like a large donut. It  will take about 30-60 minutes for the machine to produce pictures of your body. You will need to stay still during this time. AFTER THE PROCEDURE  You may resume your normal diet and activities.  Drink several 8 oz glasses of water following the test to flush the radioactive material out of your body.   This information is not intended to replace advice given to you by your health care provider. Make sure you discuss any questions you have with your health care provider.   Document Released: 02/13/2003 Document Revised: 08/30/2014 Document Reviewed: 11/21/2013 Elsevier Interactive Patient Education 2016 [place] Scan A MUGA scan (multigated acquisition scan) is a test that makes pictures of your heart at specific times while it beats. The test uses a radioactive dye (tracer) that is injected into your bloodstream. The dye makes it possible for your health care provider to see the red blood cells passing through your heart. A MUGA scan shows:  How much blood your heart is pumping out with each heartbeat.  How well your ventricles are working. Ventricles are chambers in the lower part of your heart. They pump blood to your lungs and to the rest of your body. A MUGA scan may be done to determine:  What is causing heart symptoms, such as chest pain.  If the arteries that supply your heart are blocked.  If you have heart valve disease.  If your heart is damaged from a heart attack.  If your heart  has trouble pumping blood. LET [facility] CARE PROVIDER KNOW ABOUT:  Any allergies you have.  All medicines you are taking, including vitamins, herbs, eye drops, creams, and over-the-counter medicines.  Any blood disorders you have.  Previous surgeries you have had.  Medical conditions you have.  If you are pregnant, if you might be pregnant, or if you are breastfeeding. This is very important. RISKS AND COMPLICATIONS Generally this is a safe procedure. However,  problems can occur and include:  An allergic reaction to the tracer. This side effect is rare.  Pain and redness at the IV site.  Potential harm to your baby if you are pregnant or breastfeeding. BEFORE THE PROCEDURE  Do not take your regular medicines before your procedure if your health care provider asks you not to. Ask your health care provider about changing or stopping those medicines.  Do not drink any beverages that have caffeine for several hours before the scan. Beverages containing caffeine include coffee, tea, and soft drinks.  Ask your health care provider if you will be having an exercise scan with your MUGA scan.  If you will be having an exercise scan with your MUGA scan, do not eat or drink anything except water for 4 hours before the scan.  Wear loose, comfortable clothing. PROCEDURE  You will be asked to lie down on an exam table.  An IV tube will be put into one of your veins. Medicine will flow through the tube during the procedure.  Discs called electrodes will be attached to your chest, arms, and legs. These will be connected with wires to a machine.  A small amount of tracer will be injected through your IV. You may feel a cold sensation in your arm as it runs through your IV.  A camera will be placed over your chest. It will take a series of pictures while you rest.  If you need to have an exercise scan, you will walk on a treadmill or ride a stationary bicycle. Then you will be asked to lie back down on the exam table for more pictures.  After all of the pictures have been taken, your IV tube will be removed. AFTER THE PROCEDURE  Drink enough fluid to keep your urine clear or pale yellow. This helps to flush the tracer out of your body.  It is your responsibility to get your test results. Ask the lab or department performing the test when and how you will get your results.  Keep all follow-up visits as directed by your health care provider. This is  important.   This information is not intended to replace advice given to you by your health care provider. Make sure you discuss any questions you have with your health care provider.   Document Released: 09/28/2005 Document Revised: 08/30/2014 Document Reviewed: 12/27/2013 Elsevier Interactive Patient Education 2016 [place] An implanted port is a type of central line that is placed under the skin. Central lines are used to provide IV access when treatment or nutrition needs to be given through a person's veins. Implanted ports are used for long-term IV access. An implanted port may be placed because:   You need IV medicine that would be irritating to the small veins in your hands or arms.   You need long-term IV medicines, such as antibiotics.   You need IV nutrition for a long period.   You need frequent blood draws for  lab tests.   You need dialysis.  Implanted ports are usually placed in the chest area, but they can also be placed in the upper arm, the abdomen, or the leg. An implanted port has two main parts:   Reservoir. The reservoir is round and will appear as a small, raised area under your skin. The reservoir is the part where a needle is inserted to give medicines or draw blood.   Catheter. The catheter is a thin, flexible tube that extends from the reservoir. The catheter is placed into a large vein. Medicine that is inserted into the reservoir goes into the catheter and then into the vein.  HOW WILL I CARE FOR MY INCISION SITE? Do not get the incision site wet. Bathe or shower as directed by your health care provider.  HOW IS MY PORT ACCESSED? Special steps must be taken to access the port:   Before the port is accessed, a numbing cream can be placed on the skin. This helps numb the skin over the port site.   Your health care provider uses a sterile technique to access the port.  Your health care provider must put  on a mask and sterile gloves.  The skin over your port is cleaned carefully with an antiseptic and allowed to dry.  The port is gently pinched between sterile gloves, and a needle is inserted into the port.  Only "non-coring" port needles should be used to access the port. Once the port is accessed, a blood return should be checked. This helps ensure that the port is in the vein and is not clogged.   If your port needs to remain accessed for a constant infusion, a clear (transparent) bandage will be placed over the needle site. The bandage and needle will need to be changed every week, or as directed by your health care provider.   Keep the bandage covering the needle clean and dry. Do not get it wet. Follow your health care provider's instructions on how to take a shower or bath while the port is accessed.   If your port does not need to stay accessed, no bandage is needed over the port.  WHAT IS FLUSHING? Flushing helps keep the port from getting clogged. Follow your health care provider's instructions on how and when to flush the port. Ports are usually flushed with saline solution or a medicine called heparin. The need for flushing will depend on how the port is used.   If the port is used for intermittent medicines or blood draws, the port will need to be flushed:   After medicines have been given.   After blood has been drawn.   As part of routine maintenance.   If a constant infusion is running, the port may not need to be flushed.  HOW LONG WILL MY PORT STAY IMPLANTED? The port can stay in for as long as your health care provider thinks it is needed. When it is time for the port to come out, surgery will be done to remove it. The procedure is similar to the one performed when the port was put in.  WHEN SHOULD I SEEK IMMEDIATE MEDICAL CARE? When you have an implanted port, you should seek immediate medical care if:   You notice a bad smell coming from the incision site.    You have swelling, redness, or drainage at the incision site.   You have more swelling or pain at the port site or the surrounding area.   You  have a fever that is not controlled with medicine.   This information is not intended to replace advice given to you by your health care provider. Make sure you discuss any questions you have with your health care provider.   Document Released: 08/09/2005 Document Revised: 05/30/2013 Document Reviewed: 04/16/2013 Elsevier Interactive Patient Education 2016 Else[employer] Inc.     Lidocaine; Prilocaine cream What is this medicine? LIDOCAINE; PRILOCAINE (LYE doe kane; PRIL oh kane) is a topical anesthetic that causes loss of feeling in the skin and surrounding tissues. It is used to numb the skin before procedures or injections. This medicine may be used for other purposes; ask your health care provider or pharmacist if you have questions. What should I tell my health care provider before I take this medicine? They need to know if you have any of these conditions: -glucose-6-phosphate deficiencies -heart disease -kidney or liver disease -methemoglobinemia -an unusual or allergic reaction to lidocaine, prilocaine, other medicines, foods, dyes, or preservatives -pregnant or trying to get pregnant -breast-feeding How should I use this medicine? This medicine is for external use only on the skin. Do not take by mouth. Follow the directions on the prescription label. Wash hands before and after use. Do not use more or leave in contact with the skin longer than directed. Do not apply to eyes or open wounds. It can cause irritation and blurred or temporary loss of vision. If this medicine comes in contact with your eyes, immediately rinse the eye with water. Do not touch or rub the eye. Contact your health care provider right away. Talk to your pediatrician regarding the use of this medicine in children. While this medicine may be prescribed for children  for selected conditions, precautions do apply. Overdosage: If you think you have taken too much of this medicine contact a poison control center or emergency room at once. NOTE: This medicine is only for you. Do not share this medicine with others. What if I miss a dose? This medicine is usually only applied once prior to each procedure. It must be in contact with the skin for a period of time for it to work. If you applied this medicine later than directed, tell your health care professional before starting the procedure. What may interact with this medicine? -acetaminophen -chloroquine -dapsone -medicines to control heart rhythm -nitrates like nitroglycerin and nitroprusside -other ointments, creams, or sprays that may contain anesthetic medicine -phenobarbital -phenytoin -quinine -sulfonamides like sulfacetamide, sulfamethoxazole, sulfasalazine and others This list may not describe all possible interactions. Give your health care provider a list of all the medicines, herbs, non-prescription drugs, or dietary supplements you use. Also tell them if you smoke, drink alcohol, or use illegal drugs. Some items may interact with your medicine. What should I watch for while using this medicine? Be careful to avoid injury to the treated area while it is numb and you are not aware of pain. Avoid scratching, rubbing, or exposing the treated area to hot or cold temperatures until complete sensation has returned. The numb feeling will wear off a few hours after applying the cream. What side effects may I notice from receiving this medicine? Side effects that you should report to your doctor or health care professional as soon as possible: -blurred vision -chest pain -difficulty breathing -dizziness -drowsiness -fast or irregular heartbeat -skin rash or itching -swelling of your throat, lips, or face -trembling Side effects that usually do not require medical attention (report to your doctor or  health care professional  if they continue or are bothersome): -changes in ability to feel hot or cold -redness and swelling at the application site This list may not describe all possible side effects. Call your doctor for medical advice about side effects. You may report side effects to FDA at 1-800-FDA-1088. Where should I keep my medicine? Keep out of reach of children. Store at room temperature between 15 and 30 degrees C (59 and 86 degrees F). Keep container tightly closed. Throw away any unused medicine after the expiration date. NOTE: This sheet is a summary. It may not cover all possible information. If you have questions about this medicine, talk to your doctor, pharmacist, or health care provider.    2016, Elsevier/Gold Standard. (2008-02-12 17:14:35)    Carboplatin injection What is this medicine? CARBOPLATIN (KAR boe pla tin) is a chemotherapy drug. It targets fast dividing cells, like cancer cells, and causes these cells to die. This medicine is used to treat ovarian cancer and many other cancers. This medicine may be used for other purposes; ask your health care provider or pharmacist if you have questions. What should I tell my health care provider before I take this medicine? They need to know if you have any of these conditions: -blood disorders -hearing problems -kidney disease -recent or ongoing radiation therapy -an unusual or allergic reaction to carboplatin, cisplatin, other chemotherapy, other medicines, foods, dyes, or preservatives -pregnant or trying to get pregnant -breast-feeding How should I use this medicine? This drug is usually given as an infusion into a vein. It is administered in a hospital or clinic by a specially trained health care professional. Talk to your pediatrician regarding the use of this medicine in children. Special care may be needed. Overdosage: If you think you have taken too much of this medicine contact a poison control center or  emergency room at once. NOTE: This medicine is only for you. Do not share this medicine with others. What if I miss a dose? It is important not to miss a dose. Call your doctor or health care professional if you are unable to keep an appointment. What may interact with this medicine? -medicines for seizures -medicines to increase blood counts like filgrastim, pegfilgrastim, sargramostim -some antibiotics like amikacin, gentamicin, neomycin, streptomycin, tobramycin -vaccines Talk to your doctor or health care professional before taking any of these medicines: -acetaminophen -aspirin -ibuprofen -ketoprofen -naproxen This list may not describe all possible interactions. Give your health care provider a list of all the medicines, herbs, non-prescription drugs, or dietary supplements you use. Also tell them if you smoke, drink alcohol, or use illegal drugs. Some items may interact with your medicine. What should I watch for while using this medicine? Your condition will be monitored carefully while you are receiving this medicine. You will need important blood work done while you are taking this medicine. This drug may make you feel generally unwell. This is not uncommon, as chemotherapy can affect healthy cells as well as cancer cells. Report any side effects. Continue your course of treatment even though you feel ill unless your doctor tells you to stop. In some cases, you may be given additional medicines to help with side effects. Follow all directions for their use. Call your doctor or health care professional for advice if you get a fever, chills or sore throat, or other symptoms of a cold or flu. Do not treat yourself. This drug decreases your body's ability to fight infections. Try to avoid being around people who are sick. This  medicine may increase your risk to bruise or bleed. Call your doctor or health care professional if you notice any unusual bleeding. Be careful brushing and  flossing your teeth or using a toothpick because you may get an infection or bleed more easily. If you have any dental work done, tell your dentist you are receiving this medicine. Avoid taking products that contain aspirin, acetaminophen, ibuprofen, naproxen, or ketoprofen unless instructed by your doctor. These medicines may hide a fever. Do not become pregnant while taking this medicine. Women should inform their doctor if they wish to become pregnant or think they might be pregnant. There is a potential for serious side effects to an unborn child. Talk to your health care professional or pharmacist for more information. Do not breast-feed an infant while taking this medicine. What side effects may I notice from receiving this medicine? Side effects that you should report to your doctor or health care professional as soon as possible: -allergic reactions like skin rash, itching or hives, swelling of the face, lips, or tongue -signs of infection - fever or chills, cough, sore throat, pain or difficulty passing urine -signs of decreased platelets or bleeding - bruising, pinpoint red spots on the skin, black, tarry stools, nosebleeds -signs of decreased red blood cells - unusually weak or tired, fainting spells, lightheadedness -breathing problems -changes in hearing -changes in vision -chest pain -high blood pressure -low blood counts - This drug may decrease the number of white blood cells, red blood cells and platelets. You may be at increased risk for infections and bleeding. -nausea and vomiting -pain, swelling, redness or irritation at the injection site -pain, tingling, numbness in the hands or feet -problems with balance, talking, walking -trouble passing urine or change in the amount of urine Side effects that usually do not require medical attention (report to your doctor or health care professional if they continue or are bothersome): -hair loss -loss of appetite -metallic taste in  the mouth or changes in taste This list may not describe all possible side effects. Call your doctor for medical advice about side effects. You may report side effects to FDA at 1-800-FDA-1088. Where should I keep my medicine? This drug is given in a hospital or clinic and will not be stored at home. NOTE: This sheet is a summary. It may not cover all possible information. If you have questions about this medicine, talk to your doctor, pharmacist, or health care provider.    2016, Elsevier/Gold Standard. (2007-11-14 14:38:05)      Paclitaxel injection What is this medicine? PACLITAXEL (PAK li TAX el) is a chemotherapy drug. It targets fast dividing cells, like cancer cells, and causes these cells to die. This medicine is used to treat ovarian cancer, breast cancer, and other cancers. This medicine may be used for other purposes; ask your health care provider or pharmacist if you have questions. What should I tell my health care provider before I take this medicine? They need to know if you have any of these conditions: -blood disorders -irregular heartbeat -infection (especially a virus infection such as chickenpox, cold sores, or herpes) -liver disease -previous or ongoing radiation therapy -an unusual or allergic reaction to paclitaxel, alcohol, polyoxyethylated castor oil, other chemotherapy agents, other medicines, foods, dyes, or preservatives -pregnant or trying to get pregnant -breast-feeding How should I use this medicine? This drug is given as an infusion into a vein. It is administered in a hospital or clinic by a specially trained health care professional. Talk  to your pediatrician regarding the use of this medicine in children. Special care may be needed. Overdosage: If you think you have taken too much of this medicine contact a poison control center or emergency room at once. NOTE: This medicine is only for you. Do not share this medicine with others. What if I miss a  dose? It is important not to miss your dose. Call your doctor or health care professional if you are unable to keep an appointment. What may interact with this medicine? Do not take this medicine with any of the following medications: -disulfiram -metronidazole This medicine may also interact with the following medications: -cyclosporine -diazepam -ketoconazole -medicines to increase blood counts like filgrastim, pegfilgrastim, sargramostim -other chemotherapy drugs like cisplatin, doxorubicin, epirubicin, etoposide, teniposide, vincristine -quinidine -testosterone -vaccines -verapamil Talk to your doctor or health care professional before taking any of these medicines: -acetaminophen -aspirin -ibuprofen -ketoprofen -naproxen This list may not describe all possible interactions. Give your health care provider a list of all the medicines, herbs, non-prescription drugs, or dietary supplements you use. Also tell them if you smoke, drink alcohol, or use illegal drugs. Some items may interact with your medicine. What should I watch for while using this medicine? Your condition will be monitored carefully while you are receiving this medicine. You will need important blood work done while you are taking this medicine. This drug may make you feel generally unwell. This is not uncommon, as chemotherapy can affect healthy cells as well as cancer cells. Report any side effects. Continue your course of treatment even though you feel ill unless your doctor tells you to stop. This medicine can cause serious allergic reactions. To reduce your risk you will need to take other medicine(s) before treatment with this medicine. In some cases, you may be given additional medicines to help with side effects. Follow all directions for their use. Call your doctor or health care professional for advice if you get a fever, chills or sore throat, or other symptoms of a cold or flu. Do not treat yourself. This drug  decreases your body's ability to fight infections. Try to avoid being around people who are sick. This medicine may increase your risk to bruise or bleed. Call your doctor or health care professional if you notice any unusual bleeding. Be careful brushing and flossing your teeth or using a toothpick because you may get an infection or bleed more easily. If you have any dental work done, tell your dentist you are receiving this medicine. Avoid taking products that contain aspirin, acetaminophen, ibuprofen, naproxen, or ketoprofen unless instructed by your doctor. These medicines may hide a fever. Do not become pregnant while taking this medicine. Women should inform their doctor if they wish to become pregnant or think they might be pregnant. There is a potential for serious side effects to an unborn child. Talk to your health care professional or pharmacist for more information. Do not breast-feed an infant while taking this medicine. Men are advised not to father a child while receiving this medicine. This product may contain alcohol. Ask your pharmacist or healthcare provider if this medicine contains alcohol. Be sure to tell all healthcare providers you are taking this medicine. Certain medicines, like metronidazole and disulfiram, can cause an unpleasant reaction when taken with alcohol. The reaction includes flushing, headache, nausea, vomiting, sweating, and increased thirst. The reaction can last from 30 minutes to several hours. What side effects may I notice from receiving this medicine? Side effects that you should  report to your doctor or health care professional as soon as possible: -allergic reactions like skin rash, itching or hives, swelling of the face, lips, or tongue -low blood counts - This drug may decrease the number of white blood cells, red blood cells and platelets. You may be at increased risk for infections and bleeding. -signs of infection - fever or chills, cough, sore throat,  pain or difficulty passing urine -signs of decreased platelets or bleeding - bruising, pinpoint red spots on the skin, black, tarry stools, nosebleeds -signs of decreased red blood cells - unusually weak or tired, fainting spells, lightheadedness -breathing problems -chest pain -high or low blood pressure -mouth sores -nausea and vomiting -pain, swelling, redness or irritation at the injection site -pain, tingling, numbness in the hands or feet -slow or irregular heartbeat -swelling of the ankle, feet, hands Side effects that usually do not require medical attention (report to your doctor or health care professional if they continue or are bothersome): -bone pain -complete hair loss including hair on your head, underarms, pubic hair, eyebrows, and eyelashes -changes in the color of fingernails -diarrhea -loosening of the fingernails -loss of appetite -muscle or joint pain -red flush to skin -sweating This list may not describe all possible side effects. Call your doctor for medical advice about side effects. You may report side effects to FDA at 1-800-FDA-1088. Where should I keep my medicine? This drug is given in a hospital or clinic and will not be stored at home. NOTE: This sheet is a summary. It may not cover all possible information. If you have questions about this medicine, talk to your doctor, pharmacist, or health care provider.    2016, Elsevier/Gold Standard. (2015-03-27 13:02:56)

## 2015-10-06 NOTE — Progress Notes (Signed)
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   # Smoker; ? Paranoia   INTERVAL HISTORY:  65year old female patient with above history of newly diagnosed breast cancer- triple negative; clinical stage II;is here to review her treatment options.  Patient has met with surgery; patient is interested in lumpectomy. She is recommended neoadjuvant chemotherapy. She denies the breast mass getting any larger.  Patient denies any headaches. Denies any chest pain or shortness of breath or cough. She denies any Nausea vomiting tingling or numbness. Denies any weight loss.  REVIEW OF SYSTEMS:  A complete 10 point review of system is done which is negative except mentioned above/history of present illness.   PAST MEDICAL HISTORY :  Past Medical History  Diagnosis Date  . Hypertension   . DVT (deep venous thrombosis) (HCC)     LEFT LEG   . [place] infarction) ([facility]   . Diabetes mellitus without complication ([facility]   . Seizure disorder ([facility]   . Coronary artery disease     a. NSTEMI cath 08/02/14: LM nl, pLAD 50%, mLCx 30%, mRCA 95% s/p PCI/DES, 2nd lesion 40%, EF 55%  . HLD (hyperlipidemia)   . Lung nodule   . Anxiety   . Seizures ([facility]   . Squamous cell lung cancer ([facility] 04/23/2015    PAST SURGICAL HISTORY :   Past Surgical History  Procedure Laterality Date  . Abdominal hysterectomy      PARTIAL   . Leg surgery Right     BLOOD CLOT  . Cardiac catheterization  08/02/2014  . Coronary angioplasty  08/02/2014    drug eluting stent placement   . Ivc filter placement ([facility] hx)      FAMILY HISTORY :   Family History  Problem Relation Age of Onset  . Heart attack Mother   . Breast cancer Neg Hx     SOCIAL HISTORY:   Social History  Substance Use Topics  . Smoking status: Current Every Day Smoker -- 1.00 packs/day for 45 years    Types: Cigarettes  . Smokeless tobacco: Never Used  . Alcohol Use: No    ALLERGIES:  is allergic to penicillins.  MEDICATIONS:  Current Outpatient Prescriptions  Medication Sig Dispense Refill  . atorvastatin (LIPITOR) 40 MG tablet Take 1 tablet by mouth 1 day or 1 dose.    . diazepam (VALIUM) 5 MG tablet Take 1 tablet by mouth 2 (two) times daily as needed.    . empagliflozin (JARDIANCE) 25 MG TABS tablet Take 25 mg by mouth daily.    .Marland Kitchenlisinopril-hydrochlorothiazide (PRINZIDE,ZESTORETIC) 20-25 MG per tablet Take 1 tablet by mouth daily.     . metFORMIN (GLUCOPHAGE) 1000 MG tablet Take 1 tablet by mouth 2 (two) times daily.    . naproxen (NAPROSYN) 500 MG tablet Take 1 tablet by mouth 2 (two) times daily.    . nitroGLYCERIN (NITROSTAT) 0.4 MG SL tablet Place 0.4 mg under the tongue every 5 (five) minutes as needed for chest pain.    .Marland Kitchen  phenytoin (DILANTIN) 100 MG ER capsule Take 300 mg by mouth daily.     . pioglitazone (ACTOS) 45 MG tablet Take 1 tablet by mouth daily.    . traMADol-acetaminophen (ULTRACET) 37.5-325 MG tablet     . XARELTO 10 MG TABS tablet Take 1 tablet by mouth every evening.    Marland Kitchen HYDROcodone-acetaminophen (NORCO/VICODIN) 5-325 MG tablet Take 1 tablet by mouth every 4 (four) hours as needed for moderate pain. 20 tablet 0  . lidocaine-prilocaine (EMLA) cream Apply 1 application topically as needed. Apply to port a cath site 1 hour before chemotherapy treatment. 30 g 6   No current facility-administered medications for this visit.   Facility-Administered Medications Ordered in Other Visits  Medication Dose Route Frequency Provider Last Rate Last Dose  . albuterol  (PROVENTIL) (2.5 MG/3ML) 0.083% nebulizer solution 2.5 mg  2.5 mg Nebulization Once Nestor Lewandowsky, MD        PHYSICAL EXAMINATION: ECOG PERFORMANCE STATUS: 0 - Asymptomatic  BP 150/84 mmHg  Pulse 86  Temp(Src) 96.9 F (36.1 C) (Tympanic)  Resp 18  There were no vitals filed for this visit.  GENERAL: Well-nourished well-developed; Alert, no distress and comfortable.  Alone;    LABORATORY DATA:  I have reviewed the data as listed    Component Value Date/Time   NA 136 03/03/2015 0443   NA 141 08/03/2014 0050   K 3.5 03/03/2015 0443   K 3.3* 08/03/2014 0050   CL 103 03/03/2015 0443   CL 108* 08/03/2014 0050   CO2 26 03/03/2015 0443   CO2 27 08/03/2014 0050   GLUCOSE 162* 03/03/2015 0443   GLUCOSE 164* 08/03/2014 0050   BUN 24* 03/03/2015 0443   BUN 14 08/03/2014 0050   CREATININE 1.10* 09/22/2015 1107   CREATININE 0.68 08/03/2014 0050   CALCIUM 8.3* 03/03/2015 0443   CALCIUM 8.2* 08/03/2014 0050   PROT 7.0 03/02/2015 1554   PROT 6.7 04/11/2013 2241   ALBUMIN 3.4* 03/02/2015 1554   ALBUMIN 2.7* 04/11/2013 2241   AST 21 03/02/2015 1554   AST 13* 04/11/2013 2241   ALT 23 03/02/2015 1554   ALT 16 04/11/2013 2241   ALKPHOS 174* 03/02/2015 1554   ALKPHOS 207* 04/11/2013 2241   BILITOT 0.3 03/02/2015 1554   BILITOT 0.1* 04/11/2013 2241   GFRNONAA >60 03/03/2015 0443   GFRNONAA >60 08/03/2014 0050   GFRNONAA >60 04/11/2013 2241   GFRAA >60 03/03/2015 0443   GFRAA >60 08/03/2014 0050   GFRAA >60 04/11/2013 2241    No results found for: SPEP, UPEP  Lab Results  Component Value Date   WBC 7.1 04/23/2015   NEUTROABS 12.1* 02/27/2015   HGB 13.4 04/23/2015   HCT 41.4 04/23/2015   MCV 83.0 04/23/2015   PLT 171 04/23/2015      Chemistry      Component Value Date/Time   NA 136 03/03/2015 0443   NA 141 08/03/2014 0050   K 3.5 03/03/2015 0443   K 3.3* 08/03/2014 0050   CL 103 03/03/2015 0443   CL 108* 08/03/2014 0050   CO2 26 03/03/2015 0443   CO2 27 08/03/2014  0050   BUN 24* 03/03/2015 0443   BUN 14 08/03/2014 0050   CREATININE 1.10* 09/22/2015 1107   CREATININE 0.68 08/03/2014 0050      Component Value Date/Time   CALCIUM 8.3* 03/03/2015 0443   CALCIUM 8.2* 08/03/2014 0050   ALKPHOS 174* 03/02/2015 1554   ALKPHOS 207* 04/11/2013 2241   AST 21 03/02/2015 1554  AST 13* 04/11/2013 2241   ALT 23 03/02/2015 1554   ALT 16 04/11/2013 2241   BILITOT 0.3 03/02/2015 1554   BILITOT 0.1* 04/11/2013 2241       RADIOGRAPHIC STUDIES: I have personally reviewed the radiological images as listed and agreed with the findings in the report. No results found.   ASSESSMENT & PLAN:   # Breast Ca- ER/PR- Neg; Her2-NEG. T3N0- STAGE II; patient has a large primary breast malignancy [7-8 cm in size]; triple negative. Recommend PET scan to rule out any distant metastatic disease. Also get a MUGA scan.  # I would recommend carboplatin [AUC-2] with Taxol 91m/m2 weekly concurrently for 4 cycles [12 treatments]; followed by Adriamycin and Cytoxan dose dense. I discussed the potential side effects of chemotherapy- including nausea vomiting; hair loss. We'll plan to start chemotherapy on February 22nd.   # Patient will get chemotherapy education class; also performed on February 20th. Sent prescriptions for Compazine and Zofran to pharmacy.  # Also spoke to Dr. MCarrie Mew Spoke to Dr. CCasim[facility] Needleoncology. Patient will likely need radiation postchemotherapy/surgery.   # 25 minutes face-to-face with the patient discussing the above plan of care; more than 50% of time spent on prognosis/ natural history; counseling and coordination.      GCammie Sickle MD 10/06/2015 10:59 AM

## 2015-10-06 NOTE — Progress Notes (Signed)
Radiation Oncology Follow up Note  Name: Dawn Allen   Date:   10/06/2015 MRN:  [id number] DOB: April 12, 1951    This 65 y.o. female presents to the clinic today for old patient new area (OP N/A) status post SB RT treatment for stage I lung cancer now with locally advanced breast cancer.Marland Kitchen  REFERRING PROVIDER: Forest Gleason, MD  HPI: Patient is a 65 year old female. Now out about 3 months having completed SB RT to her left lung stage I non-small cell lung cancer. She presented back in January were palpable right breast mass was found to have a 5 cm mass in the upper outer quadrant of the right breast with possible abnormal right axillary lymph nodes. She underwent ultrasound-guided biopsy which showed a overall grade 3 invasive mammary carcinoma. Lymph node was negative. Patient is opted for neoadjuvant chemotherapy to be followed by either mastectomy or wide local excision. She's quite emotional about her recent diagnosis. She's having a port placed this week and is scheduled to start systemic chemotherapy in a neoadjuvant fashion next week.  COMPLICATIONS OF TREATMENT: none  FOLLOW UP COMPLIANCE: keeps appointments   PHYSICAL EXAM:  There were no vitals taken for this visit. There is a proximal 5 cm mass in the upper outer quadrant of the right breast. No other dominant mass or nodularity is noted in either breast in 2 positions examined. No axillary or supraclavicular adenopathy is appreciated. Well-developed well-nourished patient in NAD. HEENT reveals PERLA, EOMI, discs not visualized.  Oral cavity is clear. No oral mucosal lesions are identified. Neck is clear without evidence of cervical or supraclavicular adenopathy. Lungs are clear to A&P. Cardiac examination is essentially unremarkable with regular rate and rhythm without murmur rub or thrill. Abdomen is benign with no organomegaly or masses noted. Motor sensory and DTR levels are equal and symmetric in the upper and lower extremities.  Cranial nerves II through XII are grossly intact. Proprioception is intact. No peripheral adenopathy or edema is identified. No motor or sensory levels are noted. Crude visual fields are within normal range.  RADIOLOGY RESULTS: Mammograms and ultrasound reviewed  PLAN: At this time I discussed briefly the role of adjuvant radiation therapy which will be necessary either with mastectomy or wide local excision after neoadjuvant chemotherapy based on the size of her tumor as a poor prognostic feature. I will reevaluate her in consult or after completion of neoadjuvant chemotherapy and decided upon surgery. Briefly reviewed risks and benefits of radiation therapy with the patient. I have discussed the case personally with medical oncology. Will follow-up appropriately.  I would like to take this opportunity for allowing me to participate in the care of your patient.Armstead Peaks., MD

## 2015-10-07 ENCOUNTER — Encounter: Payer: Self-pay | Admitting: *Deleted

## 2015-10-07 NOTE — Patient Instructions (Signed)

## 2015-10-07 NOTE — Progress Notes (Signed)
  Oncology Nurse Navigator Documentation  Navigator Location: [facility] (10/07/15 1600) Navigator Encounter Type: Clinic/MDC;Telephone (10/07/15 1600) Telephone: [facility] Call (10/07/15 1600)         Patient Visit Type:  (pre admit testing) (10/07/15 1600)   Barriers/Navigation Needs: Transportation;Coordination of Care (10/07/15 1600)   Interventions: Coordination of Care (10/07/15 1600)   Coordination of Care: Appts (10/07/15 1600)        Acuity: Level 4 (10/07/15 1600)         Time Spent with Patient: > 120 (10/07/15 1600)   Dawn Allen and myself worked with the patient today to coordinate care.  Patient was here for her pre-admit testing and walked out.  She was upset.  We worked as a team to calm the patient and coordinate care to get her rescheduled and  arrange transportation.  Patient has been rescheduled for 10/08/15 @ 2:45.

## 2015-10-08 ENCOUNTER — Encounter
Admission: RE | Admit: 2015-10-08 | Discharge: 2015-10-08 | Disposition: A | Discharge status: Home / Self Care | Payer: Medicare Other | Source: Ambulatory Visit | Attending: Surgery | Admitting: Surgery

## 2015-10-08 ENCOUNTER — Ambulatory Visit: Payer: Medicare Other | Admitting: Radiation Oncology

## 2015-10-08 DIAGNOSIS — Z01810 Encounter for preprocedural cardiovascular examination: Secondary | ICD-10-CM | POA: Diagnosis present

## 2015-10-08 DIAGNOSIS — Z01812 Encounter for preprocedural laboratory examination: Secondary | ICD-10-CM | POA: Insufficient documentation

## 2015-10-08 LAB — CBC WITH DIFFERENTIAL/PLATELET
Basophils Absolute: 0 10*3/uL (ref 0–0.1)
Basophils Relative: 1 %
EOS PCT: 3 %
Eosinophils Absolute: 0.3 10*3/uL (ref 0–0.7)
HEMATOCRIT: 45 % (ref 35.0–47.0)
Hemoglobin: 14.7 g/dL (ref 12.0–16.0)
LYMPHS PCT: 30 %
Lymphs Abs: 2.2 10*3/uL (ref 1.0–3.6)
MCH: 27.1 pg (ref 26.0–34.0)
MCHC: 32.5 g/dL (ref 32.0–36.0)
MCV: 83.4 fL (ref 80.0–100.0)
MONO ABS: 0.6 10*3/uL (ref 0.2–0.9)
MONOS PCT: 8 %
NEUTROS ABS: 4.3 10*3/uL (ref 1.4–6.5)
Neutrophils Relative %: 58 %
Platelets: 135 10*3/uL — ABNORMAL LOW (ref 150–440)
RBC: 5.4 MIL/uL — ABNORMAL HIGH (ref 3.80–5.20)
RDW: 16.7 % — AB (ref 11.5–14.5)
WBC: 7.5 10*3/uL (ref 3.6–11.0)

## 2015-10-08 LAB — COMPREHENSIVE METABOLIC PANEL
ALT: 23 U/L (ref 14–54)
ANION GAP: 8 (ref 5–15)
AST: 23 U/L (ref 15–41)
Albumin: 3.4 g/dL — ABNORMAL LOW (ref 3.5–5.0)
Alkaline Phosphatase: 190 U/L — ABNORMAL HIGH (ref 38–126)
BILIRUBIN TOTAL: 0.2 mg/dL — AB (ref 0.3–1.2)
BUN: 28 mg/dL — AB (ref 6–20)
CO2: 28 mmol/L (ref 22–32)
Calcium: 8.4 mg/dL — ABNORMAL LOW (ref 8.9–10.3)
Chloride: 103 mmol/L (ref 101–111)
Creatinine, Ser: 1.13 mg/dL — ABNORMAL HIGH (ref 0.44–1.00)
GFR, EST AFRICAN AMERICAN: 58 mL/min — AB (ref >60)
GFR, EST NON AFRICAN AMERICAN: 50 mL/min — AB (ref >60)
Glucose, Bld: 216 mg/dL — ABNORMAL HIGH (ref 65–99)
POTASSIUM: 4.2 mmol/L (ref 3.5–5.1)
Sodium: 139 mmol/L (ref 135–145)
Total Protein: 7 g/dL (ref 6.5–8.1)

## 2015-10-08 LAB — SURGICAL PCR SCREEN
MRSA, PCR: NEGATIVE
Staphylococcus aureus: NEGATIVE

## 2015-10-08 NOTE — Patient Instructions (Signed)
  Your procedure is scheduled on: Monday 10/13/15 Report to Day Surgery. 2ND FLOOR MEDICAL MALL ENTRANCE To find out your arrival time please call [phone] between 1PM - 3PM on Friday 10/10/15.  Remember: Instructions that are not followed completely may result in serious medical risk, up to and including death, or upon the discretion of your surgeon and anesthesiologist your surgery may need to be rescheduled.    __X__ 1. Do not eat food or drink liquids after midnight. No gum chewing or hard candies.     __X__ 2. No Alcohol for 24 hours before or after surgery.   ____ 3. Bring all medications with you on the day of surgery if instructed.    __X__ 4. Notify your doctor if there is any change in your medical condition     (cold, fever, infections).     Do not wear jewelry, make-up, hairpins, clips or nail polish.  Do not wear lotions, powders, or perfumes.   Do not shave 48 hours prior to surgery. Men may shave face and neck.  Do not bring valuables to the hospital.    [facility] is not responsible for any belongings or valuables.               Contacts, dentures or bridgework may not be worn into surgery.  Leave your suitcase in the car. After surgery it may be brought to your room.  For patients admitted to the hospital, discharge time is determined by your                treatment team.   Patients discharged the day of surgery will not be allowed to drive home.   Please read over the following fact sheets that you were given:   MRSA Information and Surgical Site Infection Prevention   __X_ Take these medicines the morning of surgery with A SIP OF WATER:    1. ATORVASTATIN/LIPITOR  2. DILANTIN  3.   4.  5.  6.  ____ Fleet Enema (as directed)   __X__ Use CHG Soap as directed  ____ Use inhalers on the day of surgery  ____ Stop metformin 2 days prior to surgery    ____ Take 1/2 of usual insulin dose the night before surgery and none on the morning of surgery.    ____ Stop Coumadin/Plavix/aspirin on   __X__ Stop Anti-inflammatories on TODAY   ____ Stop supplements until after surgery.    ____ Bring C-Pap to the hospital.

## 2015-10-09 ENCOUNTER — Encounter
Admission: RE | Admit: 2015-10-09 | Discharge: 2015-10-09 | Disposition: A | Discharge status: Home / Self Care | Payer: Medicare Other | Source: Ambulatory Visit | Attending: Internal Medicine | Admitting: Internal Medicine

## 2015-10-09 ENCOUNTER — Inpatient Hospital Stay: Payer: Medicare Other

## 2015-10-09 DIAGNOSIS — C3492 Malignant neoplasm of unspecified part of left bronchus or lung: Secondary | ICD-10-CM | POA: Diagnosis present

## 2015-10-09 DIAGNOSIS — C50811 Malignant neoplasm of overlapping sites of right female breast: Secondary | ICD-10-CM

## 2015-10-09 LAB — GLUCOSE, CAPILLARY: GLUCOSE-CAPILLARY: 129 mg/dL — AB (ref 65–99)

## 2015-10-09 MED ORDER — FLUDEOXYGLUCOSE F - 18 (FDG) INJECTION
12.1000 | Freq: Once | INTRAVENOUS | Status: AC | PRN
  Administered 2015-10-09: 12.1 via INTRAVENOUS

## 2015-10-09 NOTE — Pre-Procedure Instructions (Signed)
EKG NOTED AND COMPARED WITH 2015

## 2015-10-10 ENCOUNTER — Encounter
Admission: RE | Admit: 2015-10-10 | Discharge: 2015-10-10 | Disposition: A | Discharge status: Home / Self Care | Payer: Medicare Other | Source: Ambulatory Visit | Attending: Internal Medicine | Admitting: Internal Medicine

## 2015-10-10 DIAGNOSIS — C50811 Malignant neoplasm of overlapping sites of right female breast: Secondary | ICD-10-CM

## 2015-10-10 DIAGNOSIS — C3492 Malignant neoplasm of unspecified part of left bronchus or lung: Secondary | ICD-10-CM | POA: Diagnosis not present

## 2015-10-10 MED ORDER — TECHNETIUM TC 99M-LABELED RED BLOOD CELLS IV KIT
21.3100 | PACK | Freq: Once | INTRAVENOUS | Status: AC | PRN
  Administered 2015-10-10: 21.31 via INTRAVENOUS

## 2015-10-10 NOTE — Progress Notes (Signed)
  Oncology Nurse Navigator Documentation  Navigator Location: [facility] (10/10/15 1500)             Patient Visit Type: Follow-up (10/10/15 1500)     Education: Preparing for Upcoming Surgery/ Treatment (Port placement) (10/10/15 1500) Interventions: Coordination of Care;Education Method (10/10/15 1500)   Coordination of Care: Appts (10/10/15 1500) Education Method: Verbal (Medication information) (10/10/15 1500)      Acuity: Level 4 (10/10/15 1500)       Acuity Level 4: Ongoing guidance, education and support provided throughout the patient's treatment (10/10/15 1500) Time Spent with Patient: 30 (10/10/15 1500)   Met patient after MUGA scan.  She had questions about information she received in chemotherapy class yesterday regarding diet, crowds.  Also questions about when to stop Xarelto prior to port placement on Monday.  Phoned Dr. Geoffry Paradise office.  Nurse said to stop Xarelto now, and Dr. Azalee Course will address when to restart after port placement.  Phoned patient with this instruction. She verbalizes understanding.

## 2015-10-13 ENCOUNTER — Ambulatory Visit: Payer: Medicare Other | Admitting: Anesthesiology

## 2015-10-13 ENCOUNTER — Encounter: Payer: Self-pay | Admitting: *Deleted

## 2015-10-13 ENCOUNTER — Other Ambulatory Visit: Payer: Self-pay | Admitting: Internal Medicine

## 2015-10-13 ENCOUNTER — Encounter
Admission: RE | Disposition: A | Discharge status: Home / Self Care | Payer: Self-pay | Source: Ambulatory Visit | Attending: Surgery

## 2015-10-13 ENCOUNTER — Ambulatory Visit: Payer: Medicare Other

## 2015-10-13 ENCOUNTER — Ambulatory Visit
Admission: RE | Admit: 2015-10-13 | Discharge: 2015-10-13 | Disposition: A | Discharge status: Home / Self Care | Payer: Medicare Other | Source: Ambulatory Visit | Attending: Surgery | Admitting: Surgery

## 2015-10-13 DIAGNOSIS — C50911 Malignant neoplasm of unspecified site of right female breast: Secondary | ICD-10-CM | POA: Diagnosis present

## 2015-10-13 DIAGNOSIS — Z86718 Personal history of other venous thrombosis and embolism: Secondary | ICD-10-CM | POA: Diagnosis not present

## 2015-10-13 DIAGNOSIS — Z90710 Acquired absence of both cervix and uterus: Secondary | ICD-10-CM | POA: Insufficient documentation

## 2015-10-13 DIAGNOSIS — Z923 Personal history of irradiation: Secondary | ICD-10-CM | POA: Diagnosis not present

## 2015-10-13 DIAGNOSIS — Z95828 Presence of other vascular implants and grafts: Secondary | ICD-10-CM

## 2015-10-13 DIAGNOSIS — Z7984 Long term (current) use of oral hypoglycemic drugs: Secondary | ICD-10-CM | POA: Insufficient documentation

## 2015-10-13 DIAGNOSIS — Z955 Presence of coronary angioplasty implant and graft: Secondary | ICD-10-CM | POA: Diagnosis not present

## 2015-10-13 DIAGNOSIS — Z79899 Other long term (current) drug therapy: Secondary | ICD-10-CM | POA: Insufficient documentation

## 2015-10-13 DIAGNOSIS — C73 Malignant neoplasm of thyroid gland: Secondary | ICD-10-CM | POA: Insufficient documentation

## 2015-10-13 DIAGNOSIS — I252 Old myocardial infarction: Secondary | ICD-10-CM | POA: Diagnosis not present

## 2015-10-13 DIAGNOSIS — Z6834 Body mass index (BMI) 34.0-34.9, adult: Secondary | ICD-10-CM | POA: Diagnosis not present

## 2015-10-13 DIAGNOSIS — Z7951 Long term (current) use of inhaled steroids: Secondary | ICD-10-CM | POA: Insufficient documentation

## 2015-10-13 DIAGNOSIS — G40909 Epilepsy, unspecified, not intractable, without status epilepticus: Secondary | ICD-10-CM | POA: Diagnosis not present

## 2015-10-13 DIAGNOSIS — Z85118 Personal history of other malignant neoplasm of bronchus and lung: Secondary | ICD-10-CM | POA: Diagnosis not present

## 2015-10-13 DIAGNOSIS — E669 Obesity, unspecified: Secondary | ICD-10-CM | POA: Diagnosis not present

## 2015-10-13 DIAGNOSIS — I10 Essential (primary) hypertension: Secondary | ICD-10-CM | POA: Diagnosis not present

## 2015-10-13 DIAGNOSIS — Z880 Allergy status to penicillin: Secondary | ICD-10-CM | POA: Diagnosis not present

## 2015-10-13 DIAGNOSIS — Z8249 Family history of ischemic heart disease and other diseases of the circulatory system: Secondary | ICD-10-CM | POA: Insufficient documentation

## 2015-10-13 DIAGNOSIS — E119 Type 2 diabetes mellitus without complications: Secondary | ICD-10-CM | POA: Insufficient documentation

## 2015-10-13 DIAGNOSIS — Z7901 Long term (current) use of anticoagulants: Secondary | ICD-10-CM | POA: Insufficient documentation

## 2015-10-13 DIAGNOSIS — I2510 Atherosclerotic heart disease of native coronary artery without angina pectoris: Secondary | ICD-10-CM | POA: Diagnosis not present

## 2015-10-13 DIAGNOSIS — F17210 Nicotine dependence, cigarettes, uncomplicated: Secondary | ICD-10-CM | POA: Diagnosis not present

## 2015-10-13 DIAGNOSIS — J449 Chronic obstructive pulmonary disease, unspecified: Secondary | ICD-10-CM | POA: Insufficient documentation

## 2015-10-13 DIAGNOSIS — E785 Hyperlipidemia, unspecified: Secondary | ICD-10-CM | POA: Insufficient documentation

## 2015-10-13 DIAGNOSIS — F419 Anxiety disorder, unspecified: Secondary | ICD-10-CM | POA: Insufficient documentation

## 2015-10-13 HISTORY — PX: PORTACATH PLACEMENT: SHX2246

## 2015-10-13 LAB — GLUCOSE, CAPILLARY
GLUCOSE-CAPILLARY: 119 mg/dL — AB (ref 65–99)
Glucose-Capillary: 180 mg/dL — ABNORMAL HIGH (ref 65–99)
Glucose-Capillary: 133 mg/dL — ABNORMAL HIGH (ref 65–99)

## 2015-10-13 SURGERY — INSERTION, TUNNELED CENTRAL VENOUS DEVICE, WITH PORT
Anesthesia: General | Laterality: Left | Wound class: Clean

## 2015-10-13 MED ORDER — PROPOFOL 500 MG/50ML IV EMUL: INTRAVENOUS | Status: DC | PRN

## 2015-10-13 MED ORDER — FENTANYL CITRATE (PF) 100 MCG/2ML IJ SOLN
25.0000 ug | INTRAMUSCULAR | Status: DC | PRN
  Administered 2015-10-13: 25 ug via INTRAVENOUS

## 2015-10-13 MED ORDER — ONDANSETRON HCL 4 MG/2ML IJ SOLN: 4.0000 mg | Freq: Once | INTRAMUSCULAR | Status: DC | PRN

## 2015-10-13 MED ORDER — FAMOTIDINE 20 MG PO TABS
20.0000 mg | ORAL_TABLET | Freq: Once | ORAL | Status: AC
  Administered 2015-10-13: 20 mg via ORAL

## 2015-10-13 MED ORDER — CHLORHEXIDINE GLUCONATE 4 % EX LIQD: 1.0000 "application " | Freq: Once | CUTANEOUS | Status: DC

## 2015-10-13 MED ORDER — SODIUM CHLORIDE 0.9 % IJ SOLN
INTRAMUSCULAR | Status: AC
  Filled 2015-10-13: qty 50

## 2015-10-13 MED ORDER — LIDOCAINE HCL (CARDIAC) 20 MG/ML IV SOLN
INTRAVENOUS | Status: DC | PRN
  Administered 2015-10-13: 100 mg via INTRAVENOUS

## 2015-10-13 MED ORDER — FENTANYL CITRATE (PF) 100 MCG/2ML IJ SOLN
INTRAMUSCULAR | Status: AC
  Filled 2015-10-13: qty 2

## 2015-10-13 MED ORDER — SODIUM CHLORIDE 0.9 % IV SOLN
INTRAVENOUS | Status: DC | PRN
  Administered 2015-10-13: 5 mL via INTRAMUSCULAR

## 2015-10-13 MED ORDER — HEPARIN SODIUM (PORCINE) 5000 UNIT/ML IJ SOLN
INTRAMUSCULAR | Status: AC
  Filled 2015-10-13: qty 1

## 2015-10-13 MED ORDER — CIPROFLOXACIN IN D5W 400 MG/200ML IV SOLN
INTRAVENOUS | Status: AC
  Filled 2015-10-13: qty 200

## 2015-10-13 MED ORDER — FAMOTIDINE 20 MG PO TABS
ORAL_TABLET | ORAL | Status: AC
  Administered 2015-10-13: 20 mg via ORAL
  Filled 2015-10-13: qty 1

## 2015-10-13 MED ORDER — LIDOCAINE HCL (PF) 1 % IJ SOLN
INTRAMUSCULAR | Status: DC
Start: 2015-10-13 — End: 2015-10-13
  Filled 2015-10-13: qty 2

## 2015-10-13 MED ORDER — CIPROFLOXACIN IN D5W 400 MG/200ML IV SOLN
400.0000 mg | INTRAVENOUS | Status: AC
  Administered 2015-10-13: 400 mg via INTRAVENOUS

## 2015-10-13 MED ORDER — PROPOFOL 10 MG/ML IV BOLUS
INTRAVENOUS | Status: DC | PRN
  Administered 2015-10-13: 180 mg via INTRAVENOUS
  Administered 2015-10-13: 20 mg via INTRAVENOUS

## 2015-10-13 MED ORDER — BUPIVACAINE HCL (PF) 0.5 % IJ SOLN
INTRAMUSCULAR | Status: AC
  Filled 2015-10-13: qty 30

## 2015-10-13 MED ORDER — SODIUM CHLORIDE 0.9 % IV SOLN
INTRAVENOUS | Status: DC
  Administered 2015-10-13: 11:00:00 via INTRAVENOUS

## 2015-10-13 MED ORDER — BUPIVACAINE HCL (PF) 0.5 % IJ SOLN
INTRAMUSCULAR | Status: DC | PRN
  Administered 2015-10-13: 7 mL

## 2015-10-13 MED ORDER — PHENYLEPHRINE HCL 10 MG/ML IJ SOLN
INTRAMUSCULAR | Status: DC | PRN
  Administered 2015-10-13: 100 ug via INTRAVENOUS
  Administered 2015-10-13 (×2): 200 ug via INTRAVENOUS
  Administered 2015-10-13 (×2): 100 ug via INTRAVENOUS

## 2015-10-13 MED ORDER — EPHEDRINE SULFATE 50 MG/ML IJ SOLN
INTRAMUSCULAR | Status: DC | PRN
  Administered 2015-10-13: 10 mg via INTRAVENOUS

## 2015-10-13 MED ORDER — FENTANYL CITRATE (PF) 100 MCG/2ML IJ SOLN
INTRAMUSCULAR | Status: DC | PRN
  Administered 2015-10-13 (×2): 25 ug via INTRAVENOUS
  Administered 2015-10-13: 50 ug via INTRAVENOUS

## 2015-10-13 MED ORDER — ONDANSETRON HCL 4 MG/2ML IJ SOLN
INTRAMUSCULAR | Status: DC | PRN
  Administered 2015-10-13: 4 mg via INTRAVENOUS

## 2015-10-13 MED ORDER — MIDAZOLAM HCL 2 MG/2ML IJ SOLN
INTRAMUSCULAR | Status: DC | PRN
  Administered 2015-10-13: 2 mg via INTRAVENOUS

## 2015-10-13 SURGICAL SUPPLY — 27 items
BLADE SURG SZ11 CARB STEEL (BLADE) ×3 IMPLANT
CANISTER SUCT 1200ML W/VALVE (MISCELLANEOUS) ×3 IMPLANT
CHLORAPREP W/TINT 26ML (MISCELLANEOUS) ×3 IMPLANT
COVER LIGHT HANDLE STERIS (MISCELLANEOUS) ×6 IMPLANT
DECANTER SPIKE VIAL GLASS SM (MISCELLANEOUS) ×3 IMPLANT
DRAPE C-ARM XRAY 36X54 (DRAPES) ×3 IMPLANT
ELECT REM PT RETURN 9FT ADLT (ELECTROSURGICAL) ×3
ELECTRODE REM PT RTRN 9FT ADLT (ELECTROSURGICAL) ×1 IMPLANT
GLOVE PI ORTHOPRO 6.5 (GLOVE) ×12
GLOVE PI ORTHOPRO STRL 6.5 (GLOVE) ×6 IMPLANT
GOWN STRL REUS W/ TWL LRG LVL3 (GOWN DISPOSABLE) ×3 IMPLANT
GOWN STRL REUS W/TWL LRG LVL3 (GOWN DISPOSABLE) ×6
KIT PORT POWER 8FR ISP CVUE (Catheter) ×3 IMPLANT
KIT RM TURNOVER STRD PROC AR (KITS) ×3 IMPLANT
LABEL OR SOLS (LABEL) ×3 IMPLANT
LIQUID BAND (GAUZE/BANDAGES/DRESSINGS) ×3 IMPLANT
NEEDLE FILTER BLUNT 18X 1/2SAF (NEEDLE) ×2
NEEDLE FILTER BLUNT 18X1 1/2 (NEEDLE) ×1 IMPLANT
PACK PORT-A-CATH (MISCELLANEOUS) ×3 IMPLANT
SUT MNCRL 4-0 (SUTURE) ×2
SUT MNCRL 4-0 27XMFL (SUTURE) ×1
SUT PROLENE 3 0 SH DA (SUTURE) ×3 IMPLANT
SUT VIC AB 3-0 SH 27 (SUTURE) ×2
SUT VIC AB 3-0 SH 27X BRD (SUTURE) ×1 IMPLANT
SUTURE MNCRL 4-0 27XMF (SUTURE) ×1 IMPLANT
SYR 3ML LL SCALE MARK (SYRINGE) ×3 IMPLANT
SYRINGE 10CC LL (SYRINGE) ×3 IMPLANT

## 2015-10-13 NOTE — Anesthesia Procedure Notes (Signed)
Procedure Name: LMA Insertion Performed by: Lance Muss Pre-anesthesia Checklist: Patient identified, Emergency Drugs available, Suction available and Patient being monitored Oxygen Delivery Method: Circle system utilized Preoxygenation: Pre-oxygenation with 100% oxygen Intubation Type: IV induction Ventilation: Mask ventilation without difficulty LMA: LMA inserted LMA Size: 3.5 Number of attempts: 1 Tube secured with: Tape Dental Injury: Teeth and Oropharynx as per pre-operative assessment

## 2015-10-13 NOTE — Anesthesia Postprocedure Evaluation (Signed)
Anesthesia Post Note  Patient: Dawn Allen  Procedure(s) Performed: Procedure(s) (LRB): INSERTION PORT-A-CATH (Left)  Patient location during evaluation: PACU Anesthesia Type: General Level of consciousness: awake and alert Pain management: pain level controlled Vital Signs Assessment: post-procedure vital signs reviewed and stable Respiratory status: spontaneous breathing, nonlabored ventilation, respiratory function stable and patient connected to nasal cannula oxygen Cardiovascular status: blood pressure returned to baseline and stable Postop Assessment: no signs of nausea or vomiting Anesthetic complications: no    Last Vitals:  Filed Vitals:   10/13/15 1610 10/13/15 1728  BP: 127/89 139/66  Pulse: 77 78  Temp: 35.6 C   Resp: 18 18    Last Pain:  Filed Vitals:   10/13/15 1731  PainSc: 2                  Precious Haws Dorris Pierre

## 2015-10-13 NOTE — Transfer of Care (Signed)
Immediate Anesthesia Transfer of Care Note  Patient: Dawn Allen  Procedure(s) Performed: Procedure(s): INSERTION PORT-A-CATH (Left)  Patient Location: PACU  Anesthesia Type:General  Level of Consciousness: awake, alert  and oriented  Airway & Oxygen Therapy: Patient Spontanous Breathing, oxygen supplied via facemask  Post-op Assessment: Report given to RN and Post -op Vital signs reviewed and stable  Post vital signs: Reviewed and stable  Last Vitals:  Filed Vitals:   10/13/15 1001 10/13/15 1452  BP: 154/90 148/69  Pulse: 84 91  Temp: 37.2 C 36.9 C  Resp: 16 20    Complications: No apparent anesthesia complications

## 2015-10-13 NOTE — Progress Notes (Signed)
  Oncology Nurse Navigator Documentation  Navigator Location: [facility] (10/13/15 1000)         Surgery Date: 10/13/15 (10/13/15 1000)   Patient Visit Type: Surgery (10/13/15 1000)   Barriers/Navigation Needs:  (support) (10/13/15 1000) Education: Preparing for Upcoming Surgery/ Treatment (10/13/15 1000) Interventions: Coordination of Care (10/13/15 1000)            Acuity: Level 4 (10/13/15 1000)         Time Spent with Patient: 15 (10/13/15 1000)   Met patient in SDS prior to her port a cath placement.  Offered support.

## 2015-10-13 NOTE — OR Nursing (Signed)
Pt. States she took jardiance '25mg'$  this am. Blood sugar upon arrival 180. Blood sugar at 1145 is 130. Dr. Micah Flesher aware.

## 2015-10-13 NOTE — Op Note (Signed)
Central Venous Catheter Insertion Procedure Note  Procedure: Insertion of Central Venous Catheter  Indications:  65 yr old female with right breast cancer with needs for frequent infusions of caustic medications.   Attending: Susa Griffins, MD  Anesthesia: GETA  EBL: 20cc   Procedure Details  Informed consent was obtained for the procedure, including sedation.  Risks of lung perforation, hemorrhage, arrhythmia, and adverse drug reaction were discussed.  After consent was obtained patient was taken back to the operating room suite. Patient was placed in the bed of the supine position and anesthesia was induced without any difficulty. Patient had LMA placed. A timeout was performed to confirm patient identity and correct procedure.  The patient's left chest was then prepped and draped in usual sterile fashion. A introducer needle was used while aspirating underneath the left clavicle heading toward the jugular notch staying parallel with the floor. There was no return of air. Good dark venous blood return was seen on the first stick. The guidewire was then placed easily through the catheter and fluoroscopic guidance was used to ensure that the placement went down into the superior vena cava. There were no arrhythmias seen. At this time an 11 blade scalpel was used to make an incision around the wire and the needle was removed. The introducer catheter was placed over the wire using direct fluoroscopy visualization. The wire was then removed. The port catheter was then placed under direct visualization with the fluoroscopy to a level of 25 cm at the skin. The breakaway catheter was then removed.  An 11 blade scalpel was then used to make a pocket about 2 cm inferior to the incision site.  Bovie electric cardia was then used to get through a small layer subcutaneous tissue and then to ensure that there was a pocket just inferior to the incision site. A catheter passer was then used to tunnel  underneath from the catheter insertion site down to the port site. The catheter was then pulled through on this tunneler. The catheter was then clamped and cut at 28 cm. The port was then placed onto the catheter without any difficulty. A 2-0 Prolene was then used in 3 places to secure the port and 2 the inferior pocket. A Huber needle was then used to access the port with good return of blood flow and easy flushing of 5 mL of heparinized saline.  A 3-0 Vicryl was then used in a running subcutaneous closure. 4-0 Monocryl was then used in a running subcuticular stitch to close the skin. Sterile glue was then applied to both incisions.  Sponge and instrument counts were correct at the end of the case. Patient tolerated the procedure well.    Findings: There were no changes to vital signs. Catheter was flushed with 5 cc NS and heparin mixture. Patient did tolerate procedure well.  Recommendations: CXR ordered to verify placement.

## 2015-10-13 NOTE — Anesthesia Preprocedure Evaluation (Signed)
Anesthesia Evaluation  Patient identified by MRN, date of birth, ID band Patient awake    Reviewed: Allergy & Precautions, NPO status , Patient's Chart, lab work & pertinent test results  Airway Mallampati: II       Dental  (+) Poor Dentition, Missing, Dental Advisory Given   Pulmonary COPD, Current Smoker,     + decreased breath sounds      Cardiovascular hypertension, Pt. on medications + Past MI   Rhythm:Regular Rate:Normal     Neuro/Psych Seizures -, Well Controlled,  Anxiety    GI/Hepatic negative GI ROS, Neg liver ROS,   Endo/Other  diabetes, Well Controlled, Type 2, Oral Hypoglycemic Agents  Renal/GU negative Renal ROS     Musculoskeletal   Abdominal (+) + obese,   Peds  Hematology   Anesthesia Other Findings   Reproductive/Obstetrics                             Anesthesia Physical Anesthesia Plan  ASA: III  Anesthesia Plan: General   Post-op Pain Management:    Induction: Intravenous  Airway Management Planned: LMA  Additional Equipment:   Intra-op Plan:   Post-operative Plan: Extubation in OR  Informed Consent: I have reviewed the patients History and Physical, chart, labs and discussed the procedure including the risks, benefits and alternatives for the proposed anesthesia with the patient or authorized representative who has indicated his/her understanding and acceptance.     Plan Discussed with: CRNA  Anesthesia Plan Comments:         Anesthesia Quick Evaluation

## 2015-10-13 NOTE — Interval H&P Note (Signed)
History and Physical Interval Note:  10/13/2015 1:16 PM  Dawn Allen  has presented today for surgery, with the diagnosis of Right breast cancer  The various methods of treatment have been discussed with the patient and family. After consideration of risks, benefits and other options for treatment, the patient has consented to  Procedure(s): INSERTION PORT-A-CATH (Left) as a surgical intervention .  The patient's history has been reviewed, patient examined, no change in status, stable for surgery.  I have reviewed the patient's chart and labs.  Questions were answered to the patient's satisfaction.     Zitlali Primm L Julis Haubner

## 2015-10-13 NOTE — H&P (View-Only) (Signed)
Subjective:     Patient ID: Dawn Allen, female   DOB: 07-30-1951, 65 y.o.   MRN: [id number]  HPI   65 year old female who comes in today to evaluate for right breast mass as well as her right thyroid mass. Patient does have a recent history of left lung non-small cell carcinoma for which she underwent radiation. About 3 weeks ago the patient had discovered this mass in her right breast , and spoke to one of our nurse navigators. She has since had mammography showing a 5 cm lesion was suspicious  Associated microcalcifications extending to about 10 cm in size. 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 Also had a right axillary lymph node that was biopsied which was negative for malignancy. She denies any changes in the skin of her breast or any drainage. Patient denies anyone in her family having a history of cancer. Patient has never had any previous issues on mammograms and has never had any prior biopsies.   Patient is very concerned about the complications associated with chemotherapy as well is scared of losing her entire breast.  Past Medical History  Diagnosis Date  . Hypertension   . DVT (deep venous thrombosis) (HCC)     LEFT LEG   . MI (myocardial infarction) ([place])   . Diabetes mellitus without complication ([place])   . Seizure disorder ([place])   . Coronary artery disease     a. NSTEMI cath 08/02/14: LM nl, pLAD 50%, mLCx 30%, mRCA 95% s/p PCI/DES, 2nd lesion 40%, EF 55%  . HLD (hyperlipidemia)   . Lung nodule   . Anxiety   . Seizures ([place])   . 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 Abdominal hysterectomy      PARTIAL   . Leg surgery Right     BLOOD CLOT  . Cardiac catheterization  08/02/2014  . Coronary angioplasty  08/02/2014    drug eluting stent placement  . Ivc filter placement ([facility] hx)     Family History  Problem  Relation Age of Onset  . Heart attack Mother   . Breast cancer Neg Hx    Social History   Social History  . Marital Status: Single    Spouse Name: N/A  . Number of Children: N/A  . Years of Education: N/A   Social History Main Topics  . Smoking status: Current Every Day Smoker -- 1.00 packs/day for 45 years    Types: Cigarettes  . Smokeless tobacco: Never Used  . Alcohol Use: No  . Drug Use: No  . Sexual Activity:    Partners: Male   Other Topics Concern  . None   Social History Narrative    Current outpatient prescriptions:  .  atorvastatin (LIPITOR) 40 MG tablet, Take 1 tablet by mouth 1 day or 1 dose., Disp: , Rfl:  .  diazepam (VALIUM) 5 MG tablet, Take 1 tablet by mouth 2 (two) times daily as needed., Disp: , Rfl:  .  empagliflozin (JARDIANCE) 25 MG TABS tablet, Take 25 mg by mouth daily., Disp: , Rfl:  .  HYDROcodone-acetaminophen (NORCO/VICODIN) 5-325 MG tablet, Take 1 tablet by mouth every 4 (four) hours as needed for moderate pain., Disp: 20 tablet, Rfl: 0 .  lisinopril-hydrochlorothiazide (PRINZIDE,ZESTORETIC) 20-25 MG per tablet, Take 1 tablet by mouth daily. , Disp: , Rfl:  .  metFORMIN (GLUCOPHAGE)  1000 MG tablet, Take 1 tablet by mouth 2 (two) times daily., Disp: , Rfl:  .  naproxen (NAPROSYN) 500 MG tablet, Take 1 tablet by mouth 2 (two) times daily., Disp: , Rfl:  .  nitroGLYCERIN (NITROSTAT) 0.4 MG SL tablet, Place 0.4 mg under the tongue every 5 (five) minutes as needed for chest pain., Disp: , Rfl:  .  phenytoin (DILANTIN) 100 MG ER capsule, Take 300 mg by mouth daily. , Disp: , Rfl:  .  pioglitazone (ACTOS) 45 MG tablet, Take 1 tablet by mouth daily., Disp: , Rfl:  .  XARELTO 10 MG TABS tablet, Take 1 tablet by mouth every evening., Disp: , Rfl:  .  traMADol-acetaminophen (ULTRACET) 37.5-325 MG tablet, , Disp: , Rfl:  No current facility-administered medications for this visit.  Facility-Administered Medications Ordered in Other Visits:  .  albuterol  (PROVENTIL) (2.5 MG/3ML) 0.083% nebulizer solution 2.5 mg, 2.5 mg, Nebulization, Once, Nestor Lewandowsky, MD Allergies  Allergen Reactions  . Penicillins     Patient denies allergy       Review of Systems  Constitutional: Negative for fever, chills, activity change, appetite change and fatigue.  HENT: Negative for congestion and sore throat.   Respiratory: Negative for cough, chest tightness and wheezing.   Cardiovascular: Negative for chest pain, palpitations and leg swelling.  Gastrointestinal: Negative for diarrhea and abdominal distention.  Genitourinary: Negative for dysuria.  Musculoskeletal: Negative for back pain.  Skin: Negative for color change, pallor, rash and wound.  Neurological: Negative for dizziness and weakness.  Hematological: Negative for adenopathy. Does not bruise/bleed easily.  Psychiatric/Behavioral: Positive for decreased concentration and agitation. The patient is nervous/anxious.   All other systems reviewed and are negative.     There were no vitals filed for this visit.  Objective:   Physical Exam  Constitutional: She is oriented to person, place, and time. She appears well-developed and well-nourished. No distress.  HENT:  Head: Normocephalic and atraumatic.  Nose: Nose normal.  Mouth/Throat: Oropharynx is clear and moist. No oropharyngeal exudate.  Eyes: Conjunctivae and EOM are normal. Pupils are equal, round, and reactive to light. No scleral icterus.  Neck: Normal range of motion. Neck supple. No tracheal deviation present.  Cardiovascular: Normal rate, regular rhythm, normal heart sounds and intact distal pulses.  Exam reveals no gallop and no friction rub.   No murmur heard. Pulmonary/Chest: Effort normal and breath sounds normal. No respiratory distress. She has no wheezes. She has no rales.  Abdominal: Soft. Bowel sounds are normal. She exhibits no distension. There is no rebound.  Musculoskeletal: Normal range of motion. She exhibits no edema  or tenderness.  Neurological: She is alert and oriented to person, place, and time. No cranial nerve deficit.  Skin: Skin is warm and dry. No rash noted. No erythema. No pallor.  Psychiatric: Judgment and thought content normal.  Vitals reviewed. Breast Exam:  Right breast: Large approximately 8cm mass, mobile, in the upper outer quadrant.  No other masses palpable, no lymphadenopathy Left breast: no masses palpabale, no skin changes, nipple retract or lymphadenopathy     Assessment:     65 yr old with Right breast cancer     Plan:      I had a lengthy discussion with the patient concerning her diagnoses of cancer. I discussed with her that with her thyroid cancer is usually slow growing and we can put on on the back burner for now. I also discussed with her that given the size of her  breast mass that she had a couple of options available to her. The first option would be to go on and have a mastectomy , along with sampling the lymph nodes and if any of those were positive then she would need chemotherapy and possibly radiation after that. I also discussed with her that the other option would be a start chemotherapy first see if we could shrink the size of the tumor , and then potentially do a lumpectomy after that time. Patient was very concerned about having to have her whole breast removed and would like to to try chemotherapy first.   I discussed with her the risks benefits, alternatives and expected outcomes of having a port placement, including  Bleeding, infection, the for removal of the port , pneumothorax , as well as anesthetic risk of stroke heart attacks and blood clot.   She is on Xareto for prior DVTs and I will have her stop this 3 days prior  To her port placement on February 20.   I discussed with her today that her last dose of Xarelto would be on February 16.   She will have her preoperative appointment on February 14.   I also discussed with Dr. Diamantina Monks,  Her oncologist,  The  option that she had decided on. And he will likely start chemotherapy on February 21.   I spent  60 minutes with at least 50% of that time on patient on education, decision making, counseling and coordination of care.

## 2015-10-13 NOTE — Progress Notes (Signed)
Chaplain rounded the unit and offered a compassionate presence and support to the patient. Patient stated there is nothing we could do for her at this time. Chaplain Lenell Mcconnell [phone] 

## 2015-10-13 NOTE — Transfer of Care (Deleted)
Immediate Anesthesia Transfer of Care Note  Patient: Dawn Allen  Procedure(s) Performed: Procedure(s): INSERTION PORT-A-CATH (Left)  Patient Location: PACU  Anesthesia Type:General  Level of Consciousness: awake, alert  and oriented  Airway & Oxygen Therapy: Patient Spontanous Breathing and Patient connected to face mask oxygen  Post-op Assessment: Report given to RN and Post -op Vital signs reviewed and stable  Post vital signs: Reviewed and stable  Last Vitals:  Filed Vitals:   10/13/15 1001 10/13/15 1452  BP: 154/90 148/69  Pulse: 84 91  Temp: 37.2 C 36.9 C  Resp: 16 20    Complications: No apparent anesthesia complications

## 2015-10-13 NOTE — Discharge Instructions (Signed)
AMBULATORY SURGERY  DISCHARGE INSTRUCTIONS   1) The drugs that you were given will stay in your system until tomorrow so for the next 24 hours you should not:  A) Drive an automobile B) Make any legal decisions C) Drink any alcoholic beverage   2) You may resume regular meals tomorrow.  Today it is better to start with liquids and gradually work up to solid foods.  You may eat anything you prefer, but it is better to start with liquids, then soup and crackers, and gradually work up to solid foods.   3) Please notify your doctor immediately if you have any unusual bleeding, trouble breathing, redness and pain at the surgery site, drainage, fever, or pain not relieved by medication.    4) Additional Instructions:        Please contact your physician with any problems or Same Day Surgery at [phone], Monday through Friday 6 am to 4 pm, or [place] at [facility] number at [phone].    Acknowledgement of Risk of Discharge  Against Medical Advice   And Release of Liability    Because I am choosing to leave the hospital in spite of these risks, I release the hospital, its employees and officers, and my attending physician from all liability for any adverse results caused by my leaving the hospital prematurely.   ________________________________      _____________________________________ Patient's Signature                                        Date and Time   ________________________________      _____________________________________ Witness' Signature                                        Relationship to Patient    ________________________________      _____________________________________ Witness' Signature                                        Relationship to Patient   [If patient refuses to sign, write "Patient refused to sign" on the patient's signature line and have witnesses to the refusal sign as witnesses.]

## 2015-10-14 ENCOUNTER — Encounter: Payer: Self-pay | Admitting: Surgery

## 2015-10-14 ENCOUNTER — Other Ambulatory Visit: Payer: Medicare Other

## 2015-10-15 ENCOUNTER — Inpatient Hospital Stay: Payer: Medicare Other

## 2015-10-15 ENCOUNTER — Inpatient Hospital Stay: Payer: Medicare Other | Admitting: Internal Medicine

## 2015-10-15 ENCOUNTER — Telehealth: Payer: Self-pay

## 2015-10-15 ENCOUNTER — Telehealth: Payer: Self-pay | Admitting: *Deleted

## 2015-10-15 MED ORDER — IBUPROFEN 800 MG PO TABS: 800.0000 mg | ORAL_TABLET | Freq: Three times a day (TID) | ORAL | Status: DC | PRN

## 2015-10-15 NOTE — Telephone Encounter (Signed)
Called patient to let her know that Dr. Azalee Course will send her Ibuprofen 800 MG three times a day.

## 2015-10-15 NOTE — Telephone Encounter (Signed)
I will contact the patient to reeducate/reinforce teaching regarding the EMLA cream application instructions. Normally, these instructions are reinforced at the chemotherapy education. The patient did not attend this chemotherapy class yesterday as scheduled. The patient also called yesterday afternoon to cnl the chemotherapy apt for today. She spoke with navigation. She explained to [facility] that her port a cath site was "hurting so bad that she could not get dressed, tie her shoes or bend over." per [facility]. The patient desired to post pone her tx. [name] was made aware. Her chemotherapy apt will be r/s for Monday 10/20/15. In the meantime, I will work with scheduling to get the patient r/s for her chemo ed. Class as this is needed before she can get her chemotherapy treatments.

## 2015-10-15 NOTE — Telephone Encounter (Signed)
Pt states that she did go to the chemotherapy class yesterday. She was able to 'teach' back the instructions on the EMLA cream application. Pt thanked me for calling her back.

## 2015-10-15 NOTE — Telephone Encounter (Signed)
-----   Message from Loa Socks sent at 10/15/2015  8:23 AM EST ----- Pls call pt re her lidocaine cream directions.

## 2015-10-16 ENCOUNTER — Telehealth: Payer: Self-pay

## 2015-10-16 NOTE — Telephone Encounter (Signed)
Prescription was sent to patient's pharmacy

## 2015-10-20 ENCOUNTER — Inpatient Hospital Stay (HOSPITAL_BASED_OUTPATIENT_CLINIC_OR_DEPARTMENT_OTHER): Payer: Medicare Other | Admitting: Internal Medicine

## 2015-10-20 ENCOUNTER — Encounter: Payer: Self-pay | Admitting: Internal Medicine

## 2015-10-20 ENCOUNTER — Inpatient Hospital Stay: Payer: Medicare Other

## 2015-10-20 ENCOUNTER — Encounter: Payer: Self-pay | Admitting: *Deleted

## 2015-10-20 VITALS — BP 142/76 | HR 88 | Temp 97.6°F | Resp 18 | Ht 65.0 in | Wt 216.5 lb

## 2015-10-20 DIAGNOSIS — C50411 Malignant neoplasm of upper-outer quadrant of right female breast: Secondary | ICD-10-CM

## 2015-10-20 DIAGNOSIS — Z923 Personal history of irradiation: Secondary | ICD-10-CM | POA: Diagnosis not present

## 2015-10-20 DIAGNOSIS — F17210 Nicotine dependence, cigarettes, uncomplicated: Secondary | ICD-10-CM

## 2015-10-20 DIAGNOSIS — Z85118 Personal history of other malignant neoplasm of bronchus and lung: Secondary | ICD-10-CM | POA: Diagnosis not present

## 2015-10-20 DIAGNOSIS — Z5111 Encounter for antineoplastic chemotherapy: Secondary | ICD-10-CM | POA: Diagnosis not present

## 2015-10-20 DIAGNOSIS — T85848S Pain due to other internal prosthetic devices, implants and grafts, sequela: Secondary | ICD-10-CM

## 2015-10-20 DIAGNOSIS — I10 Essential (primary) hypertension: Secondary | ICD-10-CM

## 2015-10-20 DIAGNOSIS — Z171 Estrogen receptor negative status [ER-]: Secondary | ICD-10-CM

## 2015-10-20 DIAGNOSIS — Z7984 Long term (current) use of oral hypoglycemic drugs: Secondary | ICD-10-CM

## 2015-10-20 DIAGNOSIS — E119 Type 2 diabetes mellitus without complications: Secondary | ICD-10-CM

## 2015-10-20 DIAGNOSIS — C50811 Malignant neoplasm of overlapping sites of right female breast: Secondary | ICD-10-CM

## 2015-10-20 DIAGNOSIS — C50819 Malignant neoplasm of overlapping sites of unspecified female breast: Secondary | ICD-10-CM

## 2015-10-20 DIAGNOSIS — C50911 Malignant neoplasm of unspecified site of right female breast: Secondary | ICD-10-CM

## 2015-10-20 DIAGNOSIS — E785 Hyperlipidemia, unspecified: Secondary | ICD-10-CM

## 2015-10-20 DIAGNOSIS — M79602 Pain in left arm: Secondary | ICD-10-CM

## 2015-10-20 DIAGNOSIS — X58XXXS Exposure to other specified factors, sequela: Secondary | ICD-10-CM

## 2015-10-20 DIAGNOSIS — C3492 Malignant neoplasm of unspecified part of left bronchus or lung: Secondary | ICD-10-CM

## 2015-10-20 LAB — COMPREHENSIVE METABOLIC PANEL
ALT: 25 U/L (ref 14–54)
ANION GAP: 7 (ref 5–15)
AST: 20 U/L (ref 15–41)
Albumin: 3.6 g/dL (ref 3.5–5.0)
Alkaline Phosphatase: 224 U/L — ABNORMAL HIGH (ref 38–126)
BILIRUBIN TOTAL: 0.3 mg/dL (ref 0.3–1.2)
BUN: 30 mg/dL — AB (ref 6–20)
CALCIUM: 8.6 mg/dL — AB (ref 8.9–10.3)
CO2: 27 mmol/L (ref 22–32)
Chloride: 99 mmol/L — ABNORMAL LOW (ref 101–111)
Creatinine, Ser: 0.96 mg/dL (ref 0.44–1.00)
GFR calc Af Amer: >60 mL/min (ref >60)
Glucose, Bld: 204 mg/dL — ABNORMAL HIGH (ref 65–99)
POTASSIUM: 3.5 mmol/L (ref 3.5–5.1)
Sodium: 133 mmol/L — ABNORMAL LOW (ref 135–145)
TOTAL PROTEIN: 7.5 g/dL (ref 6.5–8.1)

## 2015-10-20 LAB — CBC WITH DIFFERENTIAL/PLATELET
Basophils Absolute: 0 10*3/uL (ref 0–0.1)
Basophils Relative: 1 %
Eosinophils Absolute: 0.3 10*3/uL (ref 0–0.7)
Eosinophils Relative: 4 %
HEMATOCRIT: 44.4 % (ref 35.0–47.0)
Hemoglobin: 14.8 g/dL (ref 12.0–16.0)
LYMPHS ABS: 1.7 10*3/uL (ref 1.0–3.6)
LYMPHS PCT: 24 %
MCH: 27.2 pg (ref 26.0–34.0)
MCHC: 33.4 g/dL (ref 32.0–36.0)
MCV: 81.7 fL (ref 80.0–100.0)
MONO ABS: 0.6 10*3/uL (ref 0.2–0.9)
MONOS PCT: 9 %
NEUTROS ABS: 4.4 10*3/uL (ref 1.4–6.5)
Neutrophils Relative %: 62 %
Platelets: 132 10*3/uL — ABNORMAL LOW (ref 150–440)
RBC: 5.44 MIL/uL — ABNORMAL HIGH (ref 3.80–5.20)
RDW: 16.5 % — AB (ref 11.5–14.5)
WBC: 7.1 10*3/uL (ref 3.6–11.0)

## 2015-10-20 MED ORDER — PACLITAXEL CHEMO INJECTION 300 MG/50ML
80.0000 mg/m2 | Freq: Once | INTRAVENOUS | Status: AC
  Administered 2015-10-20: 168 mg via INTRAVENOUS
  Filled 2015-10-20: qty 28

## 2015-10-20 MED ORDER — PALONOSETRON HCL INJECTION 0.25 MG/5ML
0.2500 mg | Freq: Once | INTRAVENOUS | Status: AC
  Administered 2015-10-20: 0.25 mg via INTRAVENOUS
  Filled 2015-10-20: qty 5

## 2015-10-20 MED ORDER — SODIUM CHLORIDE 0.9 % IV SOLN
Freq: Once | INTRAVENOUS | Status: AC
  Administered 2015-10-20: 11:00:00 via INTRAVENOUS
  Filled 2015-10-20: qty 1000

## 2015-10-20 MED ORDER — SODIUM CHLORIDE 0.9 % IV SOLN
226.2000 mg | Freq: Once | INTRAVENOUS | Status: AC
  Administered 2015-10-20: 230 mg via INTRAVENOUS
  Filled 2015-10-20: qty 23

## 2015-10-20 MED ORDER — SODIUM CHLORIDE 0.9 % IV SOLN: Freq: Once | INTRAVENOUS | Status: DC

## 2015-10-20 MED ORDER — SODIUM CHLORIDE 0.9 % IV SOLN
10.0000 mg | Freq: Once | INTRAVENOUS | Status: AC
  Administered 2015-10-20: 10 mg via INTRAVENOUS
  Filled 2015-10-20: qty 1

## 2015-10-20 MED ORDER — FAMOTIDINE IN NACL 20-0.9 MG/50ML-% IV SOLN
20.0000 mg | Freq: Once | INTRAVENOUS | Status: AC
  Administered 2015-10-20: 20 mg via INTRAVENOUS
  Filled 2015-10-20: qty 50

## 2015-10-20 MED ORDER — HYDROCODONE-ACETAMINOPHEN 5-325 MG PO TABS: 1.0000 | ORAL_TABLET | Freq: Four times a day (QID) | ORAL | Status: DC | PRN

## 2015-10-20 MED ORDER — DIPHENHYDRAMINE HCL 50 MG/ML IJ SOLN
50.0000 mg | Freq: Once | INTRAMUSCULAR | Status: AC
  Administered 2015-10-20: 50 mg via INTRAVENOUS
  Filled 2015-10-20: qty 1

## 2015-10-20 MED ORDER — HEPARIN SOD (PORK) LOCK FLUSH 100 UNIT/ML IV SOLN
500.0000 [IU] | Freq: Once | INTRAVENOUS | Status: AC | PRN
  Administered 2015-10-20: 500 [IU]
  Filled 2015-10-20: qty 5

## 2015-10-20 NOTE — Progress Notes (Signed)
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   # Smoker; ? Paranoia   INTERVAL HISTORY:  65year old female patient with above history of newly diagnosed breast cancer- triple negative; clinical stage II;is here to  Proceed with  First cycle of chemotherapy. Patient had missed appointments last week because of "left arm pain/mediport pain"  In the interim patient underwent a Mediport placement. She complains of significant pain in the left arm  Since a port placement.  Otherwise patient denies any unusual shortness of breath or chest pain or cough. Very Nervous about the treatments.  REVIEW OF SYSTEMS:  A complete 10 point review of system is done which is negative except mentioned above/history of present illness.   PAST MEDICAL HISTORY :  Past Medical History  Diagnosis Date  . Hypertension   . DVT (deep venous thrombosis) (HCC)     LEFT LEG   . MI (myocardial infarction) ([place]   . Diabetes mellitus without complication ([place]   . [place]ure disorder ([facility] [place]   . Coronary artery disease     a. NSTEMI cath 08/02/14: LM nl, pLAD 50%, mLCx 30%, mRCA 95% s/p PCI/DES, 2nd lesion 40%, EF 55%  . HLD (hyperlipidemia)   . Lung nodule   . Anxiety   . [place]izures ([place]   . Squamous cell lung cancer ([facility] 04/23/2015    PAST SURGICAL HISTORY :   Past Surgical History  Procedure Laterality Date  . Abdominal hysterectomy      PARTIAL   . Leg surgery Right     BLOOD CLOT  . Cardiac catheterization  08/02/2014  . Coronary angioplasty  08/02/2014    drug  eluting stent placement  . Ivc filter placement ([facility] hx)    . Portacath placement Left 10/13/2015    Procedure: INSERTION PORT-A-CATH;  Surgeon: CHubbard Robinson MD;  Location: [facility] ORS;  Service: General;  Laterality: Left;    FAMILY HISTORY :   Family History  Problem Relation Age of Onset  . Heart attack Mother   . Breast cancer Neg Hx     SOCIAL HISTORY:   Social History  Substance Use Topics  . Smoking status: Current Every Day Smoker -- 1.00 packs/day for 45 years    Types: Cigarettes  . Smokeless tobacco: Never Used  . Alcohol Use: No    ALLERGIES:  is allergic to penicillins.  MEDICATIONS:  Current Outpatient Prescriptions  Medication Sig Dispense Refill  . apixaban (ELIQUIS) 2.5 MG TABS tablet Take 2.5 mg by mouth 2 (two) times daily.    .Marland Kitchenatorvastatin (LIPITOR) 40 MG tablet Take 1 tablet by mouth 1 day or 1 dose.    . diazepam (VALIUM) 5 MG tablet Take 1 tablet by mouth 2 (two) times daily as needed.    . empagliflozin (JARDIANCE) 25 MG TABS tablet Take 25 mg by mouth daily.    .Marland KitchenHYDROcodone-acetaminophen (NORCO/VICODIN) 5-325 MG tablet Take 1 tablet by mouth every 6 (six) hours as needed for moderate pain. 20 tablet 0  .  lidocaine-prilocaine (EMLA) cream Apply 1 application topically as needed. Apply to port a cath site 1 hour before chemotherapy treatment. 30 g 6  . lisinopril-hydrochlorothiazide (PRINZIDE,ZESTORETIC) 20-25 MG per tablet Take 1 tablet by mouth daily.     . metFORMIN (GLUCOPHAGE) 1000 MG tablet Take 1 tablet by mouth 2 (two) times daily. Reported on 10/08/2015    . naproxen (NAPROSYN) 500 MG tablet Take 1 tablet by mouth 2 (two) times daily.    . nitroGLYCERIN (NITROSTAT) 0.4 MG SL tablet Place 0.4 mg under the tongue every 5 (five) minutes as needed for chest pain.    Marland Kitchen ondansetron (ZOFRAN) 8 MG tablet Take 1 tablet (8 mg total) by mouth every 8 (eight) hours as needed for nausea or vomiting (start 3 days; after chemo). 40 tablet 0  . phenytoin  (DILANTIN) 100 MG ER capsule Take 300 mg by mouth daily.     . pioglitazone (ACTOS) 45 MG tablet Take 1 tablet by mouth daily.    . prochlorperazine (COMPAZINE) 10 MG tablet Take 1 tablet (10 mg total) by mouth every 6 (six) hours as needed for nausea or vomiting. 40 tablet 0  . traMADol-acetaminophen (ULTRACET) 37.5-325 MG tablet      No current facility-administered medications for this visit.   Facility-Administered Medications Ordered in Other Visits  Medication Dose Route Frequency Provider Last Rate Last Dose  . albuterol (PROVENTIL) (2.5 MG/3ML) 0.083% nebulizer solution 2.5 mg  2.5 mg Nebulization Once Nestor Lewandowsky, MD      . CARBOplatin (PARAPLATIN) 230 mg in sodium chloride 0.9 % 100 mL chemo infusion  230 mg Intravenous Once Cammie Sickle, MD   Stopped at 10/20/15 1504  . heparin lock flush 100 unit/mL  500 Units Intracatheter Once PRN Cammie Sickle, MD        PHYSICAL EXAMINATION: ECOG PERFORMANCE STATUS: 0 - Asymptomatic  BP 142/76 mmHg  Pulse 88  Temp(Src) 97.6 F (36.4 C) (Tympanic)  Resp 18  Ht _0  (1.651 m)  Wt 216 lb 7.9 oz (98.2 kg)  BMI 36.03 kg/m2  Filed Weights   10/20/15 0923  Weight: 216 lb 7.9 oz (98.2 kg)    GENERAL: Well-nourished well-developed; Alert, no distress and comfortable.  Alone;    LABORATORY DATA:  I have reviewed the data as listed    Component Value Date/Time   NA 133* 10/20/2015 0858   NA 141 08/03/2014 0050   K 3.5 10/20/2015 0858   K 3.3* 08/03/2014 0050   CL 99* 10/20/2015 0858   CL 108* 08/03/2014 0050   CO2 27 10/20/2015 0858   CO2 27 08/03/2014 0050   GLUCOSE 204* 10/20/2015 0858   GLUCOSE 164* 08/03/2014 0050   BUN 30* 10/20/2015 0858   BUN 14 08/03/2014 0050   CREATININE 0.96 10/20/2015 0858   CREATININE 0.68 08/03/2014 0050   CALCIUM 8.6* 10/20/2015 0858   CALCIUM 8.2* 08/03/2014 0050   PROT 7.5 10/20/2015 0858   PROT 6.7 04/11/2013 2241   ALBUMIN 3.6 10/20/2015 0858   ALBUMIN 2.7* 04/11/2013  2241   AST 20 10/20/2015 0858   AST 13* 04/11/2013 2241   ALT 25 10/20/2015 0858   ALT 16 04/11/2013 2241   ALKPHOS 224* 10/20/2015 0858   ALKPHOS 207* 04/11/2013 2241   BILITOT 0.3 10/20/2015 0858   BILITOT 0.1* 04/11/2013 2241   GFRNONAA >60 10/20/2015 0858   GFRNONAA >60 08/03/2014 0050   GFRNONAA >60 04/11/2013 2241   GFRAA >60 10/20/2015 0858   GFRAA >60 08/03/2014  0050   GFRAA >60 04/11/2013 2241    No results found for: SPEP, UPEP  Lab Results  Component Value Date   WBC 7.1 10/20/2015   NEUTROABS 4.4 10/20/2015   HGB 14.8 10/20/2015   HCT 44.4 10/20/2015   MCV 81.7 10/20/2015   PLT 132* 10/20/2015      Chemistry      Component Value Date/Time   NA 133* 10/20/2015 0858   NA 141 08/03/2014 0050   K 3.5 10/20/2015 0858   K 3.3* 08/03/2014 0050   CL 99* 10/20/2015 0858   CL 108* 08/03/2014 0050   CO2 27 10/20/2015 0858   CO2 27 08/03/2014 0050   BUN 30* 10/20/2015 0858   BUN 14 08/03/2014 0050   CREATININE 0.96 10/20/2015 0858   CREATININE 0.68 08/03/2014 0050      Component Value Date/Time   CALCIUM 8.6* 10/20/2015 0858   CALCIUM 8.2* 08/03/2014 0050   ALKPHOS 224* 10/20/2015 0858   ALKPHOS 207* 04/11/2013 2241   AST 20 10/20/2015 0858   AST 13* 04/11/2013 2241   ALT 25 10/20/2015 0858   ALT 16 04/11/2013 2241   BILITOT 0.3 10/20/2015 0858   BILITOT 0.1* 04/11/2013 2241       RADIOGRAPHIC STUDIES: I have personally reviewed the radiological images as listed and agreed with the findings in the report. No results found.   ASSESSMENT & PLAN:   # Breast Ca- ER/PR- Neg; Her2-NEG. T3N0- STAGE II; patient has a large primary breast malignancy [7-8 cm in size]; triple negative. PET scan- negative for distant metastases.   # Proceed with  Carboplatin AUC 2- Taxol 80 mg/m weekly;  Cycle #1 today.  CBC within normal limits except for slightly low platelets at 132.  # Left arm pain/mediport placement-  New prescription for hydrocodone given.   #  I'll  see the patient back in 2 weeks/ CBC CMP/ chemotherapy;  Next week-  CBC BMP/ chemotherapy.   # 25 minutes face-to-face with the patient discussing the above plan of care; more than 50% of time spent on prognosis/ natural history; counseling and coordination.      Cammie Sickle, MD 10/20/2015 2:53 PM

## 2015-10-20 NOTE — Progress Notes (Signed)
Pt very anxous about her appt today and starting treatment. She states she is still hurting in her chest where port was placed and rad. To left arm.  Rating 8. She has no more hydrocodone. She had a good weekend but worried about chemo today.

## 2015-10-20 NOTE — Progress Notes (Signed)
  Oncology Nurse Navigator Documentation  Navigator Location: [facility]Med [facility]c (10/20/15 1600) Navigator Encounter Type: Clinic/MDC (10/20/15 1600)           Patient Visit Type: MedOnc (10/20/15 1600) Treatment Phase: First Chemo Tx (10/20/15 1600) Barriers/Navigation Needs: Transportation (10/20/15 1600) Education: Understanding Cancer/ Treatment Options (10/20/15 1600) Interventions: Coordination of Care (10/20/15 1600)   Coordination of Care: Appts (10/20/15 1600)        Acuity: Level 4 (10/20/15 1600)         Time Spent with Patient: 15 (10/20/15 1600)   Met patient during her first chemo infusion.  Offered support.  Re-educated on next appointments and labs.  She is to call if she has any questions or needs.

## 2015-10-21 ENCOUNTER — Telehealth: Payer: Self-pay

## 2015-10-21 NOTE — Telephone Encounter (Signed)
       Expand All Collapse All   Called patient to ask how she was doing with her pain. She stated that she continued to have pain. I told her that she was able to take Ibuprofen every 8 hours. Patient asked me if I could send her a prescription to her pharmacy since she did not have the money to buy them over-the-counter. I told her that I would. I also reminded her that she would be starting her chemotherapy on Monday and she stated that she would. I also told her to give [place] a call if she had any questions or concerns. Patient agreed.            Wayna Chalet, CMA at 10/14/2015 2:54 PM     Status: Signed       Expand All Collapse All   Patient called stating that yesterday she had her port placed and today she is in a lot of pain. She also stated that she is not able to move her arm not even to brush her hair. Patient stated that she tried contacting [facility]) and they have not answered their call. I told her that I will call Dr. Azalee Course in reference to her pain. I also told her that I would try to get in contact with Vita Barley and/or Ann in reference on how she is feeling. Patient agreed.

## 2015-10-23 ENCOUNTER — Encounter: Payer: Self-pay | Admitting: *Deleted

## 2015-10-23 NOTE — Progress Notes (Signed)
  Oncology Nurse Navigator Documentation  Navigator Location: [facility] (10/23/15 1400) Navigator Encounter Type: Telephone (10/23/15 1400)         Treatment Initiated Date: 10/20/15 (10/23/15 1400)                      Acuity: Level 4 (10/23/15 1400)         Time Spent with Patient: 15 (10/23/15 1400)   Called patient to check on her status post her first chemo on Monday.   States she is doing well.  No complaints.  Denies nausea, vomiting, diarrhea.  She is to call if she has any questions or needs.

## 2015-10-27 ENCOUNTER — Telehealth: Payer: Self-pay | Admitting: *Deleted

## 2015-10-27 ENCOUNTER — Inpatient Hospital Stay: Payer: Medicare Other

## 2015-10-27 ENCOUNTER — Inpatient Hospital Stay: Payer: Medicare Other | Attending: Internal Medicine

## 2015-10-27 VITALS — BP 132/84 | HR 72 | Resp 20

## 2015-10-27 DIAGNOSIS — M79602 Pain in left arm: Secondary | ICD-10-CM | POA: Diagnosis not present

## 2015-10-27 DIAGNOSIS — Z85118 Personal history of other malignant neoplasm of bronchus and lung: Secondary | ICD-10-CM | POA: Diagnosis not present

## 2015-10-27 DIAGNOSIS — Z86718 Personal history of other venous thrombosis and embolism: Secondary | ICD-10-CM | POA: Insufficient documentation

## 2015-10-27 DIAGNOSIS — Z923 Personal history of irradiation: Secondary | ICD-10-CM | POA: Insufficient documentation

## 2015-10-27 DIAGNOSIS — Z171 Estrogen receptor negative status [ER-]: Secondary | ICD-10-CM | POA: Diagnosis not present

## 2015-10-27 DIAGNOSIS — D696 Thrombocytopenia, unspecified: Secondary | ICD-10-CM | POA: Insufficient documentation

## 2015-10-27 DIAGNOSIS — I252 Old myocardial infarction: Secondary | ICD-10-CM | POA: Insufficient documentation

## 2015-10-27 DIAGNOSIS — X58XXXS Exposure to other specified factors, sequela: Secondary | ICD-10-CM | POA: Diagnosis not present

## 2015-10-27 DIAGNOSIS — E785 Hyperlipidemia, unspecified: Secondary | ICD-10-CM | POA: Insufficient documentation

## 2015-10-27 DIAGNOSIS — I2510 Atherosclerotic heart disease of native coronary artery without angina pectoris: Secondary | ICD-10-CM | POA: Insufficient documentation

## 2015-10-27 DIAGNOSIS — G40909 Epilepsy, unspecified, not intractable, without status epilepticus: Secondary | ICD-10-CM | POA: Diagnosis not present

## 2015-10-27 DIAGNOSIS — Z5111 Encounter for antineoplastic chemotherapy: Secondary | ICD-10-CM | POA: Diagnosis present

## 2015-10-27 DIAGNOSIS — T85848S Pain due to other internal prosthetic devices, implants and grafts, sequela: Secondary | ICD-10-CM | POA: Diagnosis not present

## 2015-10-27 DIAGNOSIS — Z79899 Other long term (current) drug therapy: Secondary | ICD-10-CM | POA: Diagnosis not present

## 2015-10-27 DIAGNOSIS — Z880 Allergy status to penicillin: Secondary | ICD-10-CM | POA: Diagnosis not present

## 2015-10-27 DIAGNOSIS — I10 Essential (primary) hypertension: Secondary | ICD-10-CM | POA: Insufficient documentation

## 2015-10-27 DIAGNOSIS — Z7984 Long term (current) use of oral hypoglycemic drugs: Secondary | ICD-10-CM | POA: Insufficient documentation

## 2015-10-27 DIAGNOSIS — C50411 Malignant neoplasm of upper-outer quadrant of right female breast: Secondary | ICD-10-CM | POA: Insufficient documentation

## 2015-10-27 DIAGNOSIS — F17210 Nicotine dependence, cigarettes, uncomplicated: Secondary | ICD-10-CM | POA: Insufficient documentation

## 2015-10-27 DIAGNOSIS — Z7901 Long term (current) use of anticoagulants: Secondary | ICD-10-CM | POA: Diagnosis not present

## 2015-10-27 DIAGNOSIS — C50819 Malignant neoplasm of overlapping sites of unspecified female breast: Secondary | ICD-10-CM

## 2015-10-27 DIAGNOSIS — E119 Type 2 diabetes mellitus without complications: Secondary | ICD-10-CM | POA: Insufficient documentation

## 2015-10-27 DIAGNOSIS — F419 Anxiety disorder, unspecified: Secondary | ICD-10-CM | POA: Insufficient documentation

## 2015-10-27 DIAGNOSIS — C50911 Malignant neoplasm of unspecified site of right female breast: Secondary | ICD-10-CM

## 2015-10-27 LAB — CBC WITH DIFFERENTIAL/PLATELET
BASOS PCT: 1 %
Basophils Absolute: 0 10*3/uL (ref 0–0.1)
EOS ABS: 0.2 10*3/uL (ref 0–0.7)
EOS PCT: 4 %
HEMATOCRIT: 43.4 % (ref 35.0–47.0)
Hemoglobin: 14.7 g/dL (ref 12.0–16.0)
Lymphocytes Relative: 31 %
Lymphs Abs: 1.5 10*3/uL (ref 1.0–3.6)
MCH: 27.7 pg (ref 26.0–34.0)
MCHC: 33.9 g/dL (ref 32.0–36.0)
MCV: 81.6 fL (ref 80.0–100.0)
MONO ABS: 0.3 10*3/uL (ref 0.2–0.9)
MONOS PCT: 5 %
Neutro Abs: 3 10*3/uL (ref 1.4–6.5)
Neutrophils Relative %: 59 %
PLATELETS: 145 10*3/uL — AB (ref 150–440)
RBC: 5.32 MIL/uL — ABNORMAL HIGH (ref 3.80–5.20)
RDW: 16.1 % — AB (ref 11.5–14.5)
WBC: 5 10*3/uL (ref 3.6–11.0)

## 2015-10-27 LAB — BASIC METABOLIC PANEL
Anion gap: 6 (ref 5–15)
BUN: 29 mg/dL — ABNORMAL HIGH (ref 6–20)
CALCIUM: 8.3 mg/dL — AB (ref 8.9–10.3)
CO2: 26 mmol/L (ref 22–32)
CREATININE: 0.85 mg/dL (ref 0.44–1.00)
Chloride: 101 mmol/L (ref 101–111)
Glucose, Bld: 219 mg/dL — ABNORMAL HIGH (ref 65–99)
Potassium: 3.2 mmol/L — ABNORMAL LOW (ref 3.5–5.1)
SODIUM: 133 mmol/L — AB (ref 135–145)

## 2015-10-27 MED ORDER — SODIUM CHLORIDE 0.9 % IV SOLN
230.0000 mg | Freq: Once | INTRAVENOUS | Status: AC
  Administered 2015-10-27: 230 mg via INTRAVENOUS
  Filled 2015-10-27: qty 23

## 2015-10-27 MED ORDER — FAMOTIDINE IN NACL 20-0.9 MG/50ML-% IV SOLN
20.0000 mg | Freq: Once | INTRAVENOUS | Status: AC
  Administered 2015-10-27: 20 mg via INTRAVENOUS
  Filled 2015-10-27: qty 50

## 2015-10-27 MED ORDER — HEPARIN SOD (PORK) LOCK FLUSH 100 UNIT/ML IV SOLN
500.0000 [IU] | Freq: Once | INTRAVENOUS | Status: AC
  Administered 2015-10-27: 500 [IU] via INTRAVENOUS
  Filled 2015-10-27: qty 5

## 2015-10-27 MED ORDER — PALONOSETRON HCL INJECTION 0.25 MG/5ML
0.2500 mg | Freq: Once | INTRAVENOUS | Status: AC
  Administered 2015-10-27: 0.25 mg via INTRAVENOUS
  Filled 2015-10-27: qty 5

## 2015-10-27 MED ORDER — PACLITAXEL CHEMO INJECTION 300 MG/50ML
80.0000 mg/m2 | Freq: Once | INTRAVENOUS | Status: AC
  Administered 2015-10-27: 168 mg via INTRAVENOUS
  Filled 2015-10-27: qty 28

## 2015-10-27 MED ORDER — DIPHENHYDRAMINE HCL 50 MG/ML IJ SOLN
50.0000 mg | Freq: Once | INTRAMUSCULAR | Status: AC
  Administered 2015-10-27: 50 mg via INTRAVENOUS
  Filled 2015-10-27: qty 1

## 2015-10-27 MED ORDER — SODIUM CHLORIDE 0.9 % IV SOLN
Freq: Once | INTRAVENOUS | Status: AC
  Administered 2015-10-27: 10:00:00 via INTRAVENOUS
  Filled 2015-10-27: qty 1000

## 2015-10-27 MED ORDER — DEXAMETHASONE SODIUM PHOSPHATE 100 MG/10ML IJ SOLN
10.0000 mg | Freq: Once | INTRAMUSCULAR | Status: AC
  Administered 2015-10-27: 10 mg via INTRAVENOUS
  Filled 2015-10-27: qty 1

## 2015-10-27 MED ORDER — TRAMADOL-ACETAMINOPHEN 37.5-325 MG PO TABS: 1.0000 | ORAL_TABLET | Freq: Four times a day (QID) | ORAL | Status: DC | PRN

## 2015-10-27 MED ORDER — SODIUM CHLORIDE 0.9% FLUSH
10.0000 mL | INTRAVENOUS | Status: DC | PRN
  Administered 2015-10-27: 10 mL via INTRAVENOUS
  Filled 2015-10-27: qty 10

## 2015-10-27 NOTE — Telephone Encounter (Signed)
Reports that she is having pain at the port site which is very tender, states that Dr Baruch Gouty had given her some hydrocodone and that helped with the pain, but that she cannot take Ibuprofen because she is on blood thinners (eliquis) Her port was placed on 2/20)

## 2015-11-03 ENCOUNTER — Inpatient Hospital Stay: Payer: Medicare Other

## 2015-11-03 ENCOUNTER — Other Ambulatory Visit: Payer: Self-pay | Admitting: Internal Medicine

## 2015-11-03 ENCOUNTER — Telehealth: Payer: Self-pay | Admitting: Pharmacist

## 2015-11-03 ENCOUNTER — Inpatient Hospital Stay (HOSPITAL_BASED_OUTPATIENT_CLINIC_OR_DEPARTMENT_OTHER): Payer: Medicare Other | Admitting: Internal Medicine

## 2015-11-03 VITALS — BP 122/81 | HR 87 | Resp 18 | Ht 65.0 in | Wt 203.9 lb

## 2015-11-03 DIAGNOSIS — Z923 Personal history of irradiation: Secondary | ICD-10-CM

## 2015-11-03 DIAGNOSIS — M79602 Pain in left arm: Secondary | ICD-10-CM

## 2015-11-03 DIAGNOSIS — Z85118 Personal history of other malignant neoplasm of bronchus and lung: Secondary | ICD-10-CM

## 2015-11-03 DIAGNOSIS — R569 Unspecified convulsions: Secondary | ICD-10-CM

## 2015-11-03 DIAGNOSIS — T85848S Pain due to other internal prosthetic devices, implants and grafts, sequela: Secondary | ICD-10-CM

## 2015-11-03 DIAGNOSIS — C50411 Malignant neoplasm of upper-outer quadrant of right female breast: Secondary | ICD-10-CM | POA: Diagnosis not present

## 2015-11-03 DIAGNOSIS — X58XXXS Exposure to other specified factors, sequela: Secondary | ICD-10-CM

## 2015-11-03 DIAGNOSIS — Z171 Estrogen receptor negative status [ER-]: Secondary | ICD-10-CM

## 2015-11-03 DIAGNOSIS — D696 Thrombocytopenia, unspecified: Secondary | ICD-10-CM

## 2015-11-03 DIAGNOSIS — Z7984 Long term (current) use of oral hypoglycemic drugs: Secondary | ICD-10-CM

## 2015-11-03 DIAGNOSIS — C50921 Malignant neoplasm of unspecified site of right male breast: Secondary | ICD-10-CM

## 2015-11-03 DIAGNOSIS — Z79899 Other long term (current) drug therapy: Secondary | ICD-10-CM

## 2015-11-03 DIAGNOSIS — Z86718 Personal history of other venous thrombosis and embolism: Secondary | ICD-10-CM

## 2015-11-03 DIAGNOSIS — C50911 Malignant neoplasm of unspecified site of right female breast: Secondary | ICD-10-CM

## 2015-11-03 DIAGNOSIS — C50819 Malignant neoplasm of overlapping sites of unspecified female breast: Secondary | ICD-10-CM

## 2015-11-03 DIAGNOSIS — F17210 Nicotine dependence, cigarettes, uncomplicated: Secondary | ICD-10-CM

## 2015-11-03 DIAGNOSIS — Z5111 Encounter for antineoplastic chemotherapy: Secondary | ICD-10-CM | POA: Diagnosis not present

## 2015-11-03 DIAGNOSIS — Z7901 Long term (current) use of anticoagulants: Secondary | ICD-10-CM

## 2015-11-03 DIAGNOSIS — C801 Malignant (primary) neoplasm, unspecified: Secondary | ICD-10-CM

## 2015-11-03 LAB — COMPREHENSIVE METABOLIC PANEL
ALT: 25 U/L (ref 14–54)
ANION GAP: 8 (ref 5–15)
AST: 22 U/L (ref 15–41)
Albumin: 3.2 g/dL — ABNORMAL LOW (ref 3.5–5.0)
Alkaline Phosphatase: 172 U/L — ABNORMAL HIGH (ref 38–126)
BUN: 31 mg/dL — ABNORMAL HIGH (ref 6–20)
CHLORIDE: 102 mmol/L (ref 101–111)
CO2: 26 mmol/L (ref 22–32)
CREATININE: 0.76 mg/dL (ref 0.44–1.00)
Calcium: 8.5 mg/dL — ABNORMAL LOW (ref 8.9–10.3)
Glucose, Bld: 212 mg/dL — ABNORMAL HIGH (ref 65–99)
Potassium: 3.5 mmol/L (ref 3.5–5.1)
Sodium: 136 mmol/L (ref 135–145)
Total Bilirubin: 0.3 mg/dL (ref 0.3–1.2)
Total Protein: 7.3 g/dL (ref 6.5–8.1)

## 2015-11-03 LAB — CBC WITH DIFFERENTIAL/PLATELET
Basophils Absolute: 0 10*3/uL (ref 0–0.1)
Basophils Relative: 1 %
EOS ABS: 0.2 10*3/uL (ref 0–0.7)
EOS PCT: 4 %
HCT: 41.8 % (ref 35.0–47.0)
Hemoglobin: 14.2 g/dL (ref 12.0–16.0)
LYMPHS ABS: 1.8 10*3/uL (ref 1.0–3.6)
LYMPHS PCT: 40 %
MCH: 27.9 pg (ref 26.0–34.0)
MCHC: 34.1 g/dL (ref 32.0–36.0)
MCV: 81.8 fL (ref 80.0–100.0)
MONO ABS: 0.3 10*3/uL (ref 0.2–0.9)
MONOS PCT: 8 %
Neutro Abs: 2.2 10*3/uL (ref 1.4–6.5)
Neutrophils Relative %: 47 %
PLATELETS: 152 10*3/uL (ref 150–440)
RBC: 5.11 MIL/uL (ref 3.80–5.20)
RDW: 15.6 % — ABNORMAL HIGH (ref 11.5–14.5)
WBC: 4.5 10*3/uL (ref 3.6–11.0)

## 2015-11-03 LAB — PHENYTOIN LEVEL, TOTAL: Phenytoin Lvl: 11.8 ug/mL (ref 10.0–20.0)

## 2015-11-03 MED ORDER — SODIUM CHLORIDE 0.9% FLUSH
10.0000 mL | INTRAVENOUS | Status: DC | PRN
  Administered 2015-11-03: 10 mL via INTRAVENOUS
  Filled 2015-11-03: qty 10

## 2015-11-03 MED ORDER — PALONOSETRON HCL INJECTION 0.25 MG/5ML
0.2500 mg | Freq: Once | INTRAVENOUS | Status: AC
  Administered 2015-11-03: 0.25 mg via INTRAVENOUS
  Filled 2015-11-03: qty 5

## 2015-11-03 MED ORDER — DIPHENHYDRAMINE HCL 50 MG/ML IJ SOLN
50.0000 mg | Freq: Once | INTRAMUSCULAR | Status: AC
  Administered 2015-11-03: 50 mg via INTRAVENOUS
  Filled 2015-11-03: qty 1

## 2015-11-03 MED ORDER — FAMOTIDINE IN NACL 20-0.9 MG/50ML-% IV SOLN
20.0000 mg | Freq: Once | INTRAVENOUS | Status: AC
  Administered 2015-11-03: 20 mg via INTRAVENOUS
  Filled 2015-11-03: qty 50

## 2015-11-03 MED ORDER — SODIUM CHLORIDE 0.9 % IV SOLN
10.0000 mg | Freq: Once | INTRAVENOUS | Status: AC
  Administered 2015-11-03: 10 mg via INTRAVENOUS
  Filled 2015-11-03: qty 1

## 2015-11-03 MED ORDER — HEPARIN SOD (PORK) LOCK FLUSH 100 UNIT/ML IV SOLN
500.0000 [IU] | Freq: Once | INTRAVENOUS | Status: AC
  Administered 2015-11-03: 500 [IU] via INTRAVENOUS
  Filled 2015-11-03: qty 5

## 2015-11-03 MED ORDER — PACLITAXEL CHEMO INJECTION 300 MG/50ML
80.0000 mg/m2 | Freq: Once | INTRAVENOUS | Status: AC
  Administered 2015-11-03: 168 mg via INTRAVENOUS
  Filled 2015-11-03: qty 28

## 2015-11-03 MED ORDER — SODIUM CHLORIDE 0.9 % IV SOLN
Freq: Once | INTRAVENOUS | Status: AC
  Administered 2015-11-03: 10:00:00 via INTRAVENOUS
  Filled 2015-11-03: qty 1000

## 2015-11-03 MED ORDER — HYDROCODONE-ACETAMINOPHEN 5-325 MG PO TABS: 1.0000 | ORAL_TABLET | Freq: Two times a day (BID) | ORAL | Status: DC | PRN

## 2015-11-03 MED ORDER — SODIUM CHLORIDE 0.9 % IV SOLN
261.6000 mg | Freq: Once | INTRAVENOUS | Status: AC
  Administered 2015-11-03: 260 mg via INTRAVENOUS
  Filled 2015-11-03: qty 26

## 2015-11-03 NOTE — Progress Notes (Signed)
The patient expresses tenderness at port a cath site. She states that she "did not pick up the prescription for the tramadol as this prescription does not help me. This medication to me doesn't help my symptoms. I would prefer a hydrocodone prescription."

## 2015-11-03 NOTE — Progress Notes (Signed)
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   # Smoker; ? Paranoia   INTERVAL HISTORY:  65year old female patient with above history of newly diagnosed breast cancer- triple negative; clinical stage II- currently on weekly carbotaxol is here for follow-up. Patient is post 2 treatments.  Patient denies any tingling and numbness. Denies any nausea vomiting. No hair loss yet.  She still continues to complain of pain in her left arm/Mediport site. She has been getting hydrocodone one pill twice a day.  Otherwise patient denies any unusual shortness of breath or chest pain or cough. Very Nervous about the treatments.  REVIEW OF SYSTEMS:  A complete 10 point review of system is done which is negative except mentioned above/history of present illness.   PAST MEDICAL HISTORY :  Past Medical History  Diagnosis Date  . Hypertension   . DVT (deep venous thrombosis) (HCC)     LEFT LEG   . [place]) ([place]   . Diabetes mellitus without complication ([facility]   . Seizure disorder ([facility]   . Coronary artery disease     a. NSTEMI cath 08/02/14: LM nl, pLAD 50%, mLCx 30%, mRCA 95% s/p PCI/DES, 2nd lesion 40%, EF 55%  . HLD (hyperlipidemia)   . Lung nodule   . Anxiety   . Seizures ([place]   . Squamous cell lung cancer [place] 04/23/2015    PAST SURGICAL HISTORY :   Past Surgical History  Procedure Laterality Date  . Abdominal hysterectomy      PARTIAL   . Leg surgery Right     BLOOD CLOT  . Cardiac catheterization  08/02/2014  . Coronary  angioplasty  08/02/2014    drug eluting stent placement  . Ivc filter placement ([facility] hx)    . Portacath placement Left 10/13/2015    Procedure: INSERTION PORT-A-CATH;  Surgeon: CHubbard Robinson MD;  Location: [facility] ORS;  Service: General;  Laterality: Left;    FAMILY HISTORY :   Family History  Problem Relation Age of Onset  . Heart attack Mother   . Breast cancer Neg Hx     SOCIAL HISTORY:   Social History  Substance Use Topics  . Smoking status: Current Every Day Smoker -- 1.00 packs/day for 45 years    Types: Cigarettes  . Smokeless tobacco: Never Used  . Alcohol Use: No    ALLERGIES:  is allergic to penicillins.  MEDICATIONS:  Current Outpatient Prescriptions  Medication Sig Dispense Refill  . apixaban (ELIQUIS) 2.5 MG TABS tablet Take 2.5 mg by mouth 2 (two) times daily.    .Marland Kitchenatorvastatin (LIPITOR) 40 MG tablet Take 1 tablet by mouth 1 day or 1 dose.    . diazepam (VALIUM) 5 MG tablet Take 1 tablet by mouth 2 (two) times daily as needed.    . empagliflozin (JARDIANCE) 25 MG TABS tablet Take 25 mg by mouth daily.    .Marland Kitchenlidocaine-prilocaine (EMLA) cream Apply 1 application topically as needed. Apply to port a cath site 1 hour  before chemotherapy treatment. 30 g 6  . lisinopril-hydrochlorothiazide (PRINZIDE,ZESTORETIC) 20-25 MG per tablet Take 1 tablet by mouth daily.     . metFORMIN (GLUCOPHAGE) 1000 MG tablet Take 1 tablet by mouth 2 (two) times daily. Reported on 10/08/2015    . phenytoin (DILANTIN) 100 MG ER capsule Take 300 mg by mouth daily.     . pioglitazone (ACTOS) 45 MG tablet Take 1 tablet by mouth daily.    Marland Kitchen HYDROcodone-acetaminophen (NORCO/VICODIN) 5-325 MG tablet Take 1 tablet by mouth every 6 (six) hours as needed. For pain    . naproxen (NAPROSYN) 500 MG tablet Take 1 tablet by mouth 2 (two) times daily. Reported on 11/03/2015    . nitroGLYCERIN (NITROSTAT) 0.4 MG SL tablet Place 0.4 mg under the tongue every 5 (five) minutes as needed for chest pain. Reported  on 11/03/2015    . ondansetron (ZOFRAN) 8 MG tablet Take 1 tablet (8 mg total) by mouth every 8 (eight) hours as needed for nausea or vomiting (start 3 days; after chemo). (Patient not taking: Reported on 11/03/2015) 40 tablet 0  . prochlorperazine (COMPAZINE) 10 MG tablet Take 1 tablet (10 mg total) by mouth every 6 (six) hours as needed for nausea or vomiting. (Patient not taking: Reported on 11/03/2015) 40 tablet 0   No current facility-administered medications for this visit.   Facility-Administered Medications Ordered in Other Visits  Medication Dose Route Frequency Provider Last Rate Last Dose  . albuterol (PROVENTIL) (2.5 MG/3ML) 0.083% nebulizer solution 2.5 mg  2.5 mg Nebulization Once Nestor Lewandowsky, MD      . heparin lock flush 100 unit/mL  500 Units Intravenous Once Cammie Sickle, MD      . sodium chloride flush (NS) 0.9 % injection 10 mL  10 mL Intravenous PRN Cammie Sickle, MD   10 mL at 11/03/15 0903    PHYSICAL EXAMINATION: ECOG PERFORMANCE STATUS: 0 - Asymptomatic  BP 122/81 mmHg  Pulse 87  Resp 18  Ht _0  (1.651 m)  Wt 203 lb 14.8 oz (92.5 kg)  BMI 33.93 kg/m2  SpO2 100%  Filed Weights   11/03/15 0921  Weight: 203 lb 14.8 oz (92.5 kg)    GENERAL: Well-nourished well-developed; Alert, no distress and comfortable. Alone;  EYES: no pallor or icterus OROPHARYNX: no thrush or ulceration; good dentition  NECK: supple, no masses felt LYMPH: no palpable lymphadenopathy in the cervical, axillary or inguinal regions LUNGS: clear to auscultation and No wheeze or crackles HEART/CVS: regular rate & rhythm and no murmurs; No lower extremity edema ABDOMEN:abdomen soft, non-tender and normal bowel sounds Musculoskeletal:no cyanosis of digits and no clubbing  PSYCH: alert & oriented x 3 with fluent speech NEURO: no focal motor/sensory deficits SKIN: no rashes or significant lesions   LABORATORY DATA:  I have reviewed the data as listed    Component  Value Date/Time   NA 136 11/03/2015 0859   NA 141 08/03/2014 0050   K 3.5 11/03/2015 0859   K 3.3* 08/03/2014 0050   CL 102 11/03/2015 0859   CL 108* 08/03/2014 0050   CO2 26 11/03/2015 0859   CO2 27 08/03/2014 0050   GLUCOSE 212* 11/03/2015 0859   GLUCOSE 164* 08/03/2014 0050   BUN 31* 11/03/2015 0859   BUN 14 08/03/2014 0050   CREATININE 0.76 11/03/2015 0859   CREATININE 0.68 08/03/2014 0050   CALCIUM 8.5* 11/03/2015 0859   CALCIUM 8.2* 08/03/2014 0050   PROT 7.3 11/03/2015 0859   PROT 6.7 04/11/2013 2241  ALBUMIN 3.2* 11/03/2015 0859   ALBUMIN 2.7* 04/11/2013 2241   AST 22 11/03/2015 0859   AST 13* 04/11/2013 2241   ALT 25 11/03/2015 0859   ALT 16 04/11/2013 2241   ALKPHOS 172* 11/03/2015 0859   ALKPHOS 207* 04/11/2013 2241   BILITOT 0.3 11/03/2015 0859   BILITOT 0.1* 04/11/2013 2241   GFRNONAA >60 11/03/2015 0859   GFRNONAA >60 08/03/2014 0050   GFRNONAA >60 04/11/2013 2241   GFRAA >60 11/03/2015 0859   GFRAA >60 08/03/2014 0050   GFRAA >60 04/11/2013 2241    No results found for: SPEP, UPEP  Lab Results  Component Value Date   WBC 4.5 11/03/2015   NEUTROABS 2.2 11/03/2015   HGB 14.2 11/03/2015   HCT 41.8 11/03/2015   MCV 81.8 11/03/2015   PLT 152 11/03/2015      Chemistry      Component Value Date/Time   NA 136 11/03/2015 0859   NA 141 08/03/2014 0050   K 3.5 11/03/2015 0859   K 3.3* 08/03/2014 0050   CL 102 11/03/2015 0859   CL 108* 08/03/2014 0050   CO2 26 11/03/2015 0859   CO2 27 08/03/2014 0050   BUN 31* 11/03/2015 0859   BUN 14 08/03/2014 0050   CREATININE 0.76 11/03/2015 0859   CREATININE 0.68 08/03/2014 0050      Component Value Date/Time   CALCIUM 8.5* 11/03/2015 0859   CALCIUM 8.2* 08/03/2014 0050   ALKPHOS 172* 11/03/2015 0859   ALKPHOS 207* 04/11/2013 2241   AST 22 11/03/2015 0859   AST 13* 04/11/2013 2241   ALT 25 11/03/2015 0859   ALT 16 04/11/2013 2241   BILITOT 0.3 11/03/2015 0859   BILITOT 0.1* 04/11/2013 2241        RADIOGRAPHIC STUDIES: I have personally reviewed the radiological images as listed and agreed with the findings in the report. No results found.   ASSESSMENT & PLAN:   # Breast Ca- ER/PR- Neg; Her2-NEG. T3N0- STAGE II; patient has a large primary breast malignancy [7-8 cm in size]; triple negative.  Patient seems to be tolerating chemotherapy fairly well.  # Proceed with  Carboplatin AUC 2- Taxol 80 mg/m weekly;  Cycle #3 today.  CBC/CMP within normal limits- except for elevated blood glucose of 212.  # Left arm pain/mediport placement-taking hydrocodone one pill every 12 hours  New prescription for hydrocodone given.   #  I'll see the patient back in 2 weeks/ CBC CMP/ chemotherapy;  Next week-  CBC BMP/ chemotherapy.   # 25 minutes face-to-face with the patient discussing the above plan of care; more than 50% of time spent on prognosis/ natural history; counseling and coordination.      Cammie Sickle, MD 11/03/2015 9:54 AM

## 2015-11-03 NOTE — Telephone Encounter (Signed)
Spoke with [name] regarding interaction between carboplatin and phenytoin. Phenytoin levels may be lower. MD ordered phenytoin level.

## 2015-11-10 ENCOUNTER — Inpatient Hospital Stay: Payer: Medicare Other

## 2015-11-10 ENCOUNTER — Other Ambulatory Visit: Payer: Self-pay | Admitting: Internal Medicine

## 2015-11-10 DIAGNOSIS — C50921 Malignant neoplasm of unspecified site of right male breast: Secondary | ICD-10-CM

## 2015-11-10 DIAGNOSIS — Z5111 Encounter for antineoplastic chemotherapy: Secondary | ICD-10-CM | POA: Diagnosis not present

## 2015-11-10 DIAGNOSIS — C50819 Malignant neoplasm of overlapping sites of unspecified female breast: Secondary | ICD-10-CM

## 2015-11-10 LAB — BASIC METABOLIC PANEL
Anion gap: 7 (ref 5–15)
BUN: 29 mg/dL — AB (ref 6–20)
CHLORIDE: 98 mmol/L — AB (ref 101–111)
CO2: 27 mmol/L (ref 22–32)
CREATININE: 0.87 mg/dL (ref 0.44–1.00)
Calcium: 8.3 mg/dL — ABNORMAL LOW (ref 8.9–10.3)
GFR calc Af Amer: >60 mL/min (ref >60)
Glucose, Bld: 367 mg/dL — ABNORMAL HIGH (ref 65–99)
Potassium: 3.4 mmol/L — ABNORMAL LOW (ref 3.5–5.1)
SODIUM: 132 mmol/L — AB (ref 135–145)

## 2015-11-10 LAB — CBC WITH DIFFERENTIAL/PLATELET
BASOS ABS: 0.1 10*3/uL (ref 0–0.1)
BASOS PCT: 1 %
EOS ABS: 0.1 10*3/uL (ref 0–0.7)
EOS PCT: 2 %
HCT: 41.5 % (ref 35.0–47.0)
Hemoglobin: 14 g/dL (ref 12.0–16.0)
LYMPHS ABS: 1.5 10*3/uL (ref 1.0–3.6)
Lymphocytes Relative: 31 %
MCH: 28 pg (ref 26.0–34.0)
MCHC: 33.9 g/dL (ref 32.0–36.0)
MCV: 82.6 fL (ref 80.0–100.0)
MONO ABS: 0.3 10*3/uL (ref 0.2–0.9)
Monocytes Relative: 6 %
Neutro Abs: 2.9 10*3/uL (ref 1.4–6.5)
Neutrophils Relative %: 60 %
PLATELETS: 133 10*3/uL — AB (ref 150–440)
RBC: 5.02 MIL/uL (ref 3.80–5.20)
RDW: 15.7 % — AB (ref 11.5–14.5)
WBC: 4.9 10*3/uL (ref 3.6–11.0)

## 2015-11-10 MED ORDER — SODIUM CHLORIDE 0.9 % IV SOLN: 260.0000 mg | Freq: Once | INTRAVENOUS | Status: DC

## 2015-11-10 MED ORDER — HEPARIN SOD (PORK) LOCK FLUSH 100 UNIT/ML IV SOLN: 500.0000 [IU] | Freq: Once | INTRAVENOUS | Status: DC | PRN

## 2015-11-10 MED ORDER — SODIUM CHLORIDE 0.9 % IV SOLN
10.0000 mg | Freq: Once | INTRAVENOUS | Status: AC
  Administered 2015-11-10: 10 mg via INTRAVENOUS
  Filled 2015-11-10: qty 1

## 2015-11-10 MED ORDER — FAMOTIDINE IN NACL 20-0.9 MG/50ML-% IV SOLN
20.0000 mg | Freq: Once | INTRAVENOUS | Status: AC
Start: 2015-11-10 — End: 2015-11-10
  Administered 2015-11-10: 20 mg via INTRAVENOUS
  Filled 2015-11-10: qty 50

## 2015-11-10 MED ORDER — SODIUM CHLORIDE 0.9 % IV SOLN
Freq: Once | INTRAVENOUS | Status: AC
  Administered 2015-11-10: 12:00:00 via INTRAVENOUS
  Filled 2015-11-10: qty 1000

## 2015-11-10 MED ORDER — DIPHENHYDRAMINE HCL 50 MG/ML IJ SOLN
50.0000 mg | Freq: Once | INTRAMUSCULAR | Status: AC
  Administered 2015-11-10: 50 mg via INTRAVENOUS
  Filled 2015-11-10: qty 1

## 2015-11-10 MED ORDER — HEPARIN SOD (PORK) LOCK FLUSH 100 UNIT/ML IV SOLN
500.0000 [IU] | Freq: Once | INTRAVENOUS | Status: AC
  Administered 2015-11-10: 500 [IU] via INTRAVENOUS
  Filled 2015-11-10: qty 5

## 2015-11-10 MED ORDER — SODIUM CHLORIDE 0.9% FLUSH
10.0000 mL | INTRAVENOUS | Status: DC | PRN
  Administered 2015-11-10: 10 mL via INTRAVENOUS
  Filled 2015-11-10: qty 10

## 2015-11-10 MED ORDER — SODIUM CHLORIDE 0.9 % IV SOLN
240.0000 mg | Freq: Once | INTRAVENOUS | Status: AC
  Administered 2015-11-10: 240 mg via INTRAVENOUS
  Filled 2015-11-10: qty 24

## 2015-11-10 MED ORDER — PACLITAXEL CHEMO INJECTION 300 MG/50ML
80.0000 mg/m2 | Freq: Once | INTRAVENOUS | Status: AC
  Administered 2015-11-10: 168 mg via INTRAVENOUS
  Filled 2015-11-10: qty 28

## 2015-11-10 MED ORDER — PALONOSETRON HCL INJECTION 0.25 MG/5ML
0.2500 mg | Freq: Once | INTRAVENOUS | Status: AC
  Administered 2015-11-10: 0.25 mg via INTRAVENOUS
  Filled 2015-11-10: qty 5

## 2015-11-10 MED ORDER — SODIUM CHLORIDE 0.9 % IV SOLN: 244.6000 mg | Freq: Once | INTRAVENOUS | Status: DC

## 2015-11-10 NOTE — Progress Notes (Signed)
Patient had been receiving carboplatin '230mg'$  until dose on 3/13. Dose on 3/13 increased to '260mg'$  due weight change from 208lbs to 216lbs. Calculated dose of '257mg'$  more than 10% from previous dose of '230mg'$ , therefore dose was increased.   Today, 3/20, weight is 203lbs, therefore calculated dose is '240mg'$ . Will change dose to '240mg'$  and continue for further treatments.

## 2015-11-11 ENCOUNTER — Telehealth: Payer: Self-pay | Admitting: *Deleted

## 2015-11-11 NOTE — Telephone Encounter (Signed)
  Oncology Nurse Navigator Documentation  Navigator Location: [facility] (11/11/15 0900) Navigator Encounter Type: Telephone (11/11/15 0900) Telephone: Incoming Call (11/11/15 0900)         Patient Visit Type: Other (11/11/15 0900)   Barriers/Navigation Needs: Coordination of Care (11/11/15 0900) Education: Other (11/11/15 0900) Interventions: Coordination of Care (11/11/15 0900)   Coordination of Care: Appts (11/11/15 0900)        Acuity: Level 4 (11/11/15 0900)         Time Spent with Patient: 45 (11/11/15 0900)   Patient called stating no one had picked her up for her treatment.  She was not on the schedule for today.  Confirmed with Dr. Yevette Edwards that she was to receive weekly treatments.  He stated she was supposed to have treatment today.  Arranged with Renita Papa, RN to have patient put on the schedule, and transportation arranged with the [facility].  Patient arrived for treatment.

## 2015-11-17 ENCOUNTER — Inpatient Hospital Stay (HOSPITAL_BASED_OUTPATIENT_CLINIC_OR_DEPARTMENT_OTHER): Payer: Medicare Other | Admitting: Internal Medicine

## 2015-11-17 ENCOUNTER — Inpatient Hospital Stay: Payer: Medicare Other

## 2015-11-17 VITALS — BP 116/75 | HR 83

## 2015-11-17 VITALS — BP 124/75 | Temp 97.1°F | Resp 16 | Wt 203.9 lb

## 2015-11-17 DIAGNOSIS — Z5111 Encounter for antineoplastic chemotherapy: Secondary | ICD-10-CM | POA: Diagnosis not present

## 2015-11-17 DIAGNOSIS — F17210 Nicotine dependence, cigarettes, uncomplicated: Secondary | ICD-10-CM

## 2015-11-17 DIAGNOSIS — E119 Type 2 diabetes mellitus without complications: Secondary | ICD-10-CM

## 2015-11-17 DIAGNOSIS — Z171 Estrogen receptor negative status [ER-]: Secondary | ICD-10-CM

## 2015-11-17 DIAGNOSIS — I2510 Atherosclerotic heart disease of native coronary artery without angina pectoris: Secondary | ICD-10-CM

## 2015-11-17 DIAGNOSIS — X58XXXS Exposure to other specified factors, sequela: Secondary | ICD-10-CM

## 2015-11-17 DIAGNOSIS — Z86718 Personal history of other venous thrombosis and embolism: Secondary | ICD-10-CM

## 2015-11-17 DIAGNOSIS — C50819 Malignant neoplasm of overlapping sites of unspecified female breast: Secondary | ICD-10-CM

## 2015-11-17 DIAGNOSIS — Z923 Personal history of irradiation: Secondary | ICD-10-CM

## 2015-11-17 DIAGNOSIS — D696 Thrombocytopenia, unspecified: Secondary | ICD-10-CM

## 2015-11-17 DIAGNOSIS — C50921 Malignant neoplasm of unspecified site of right male breast: Secondary | ICD-10-CM

## 2015-11-17 DIAGNOSIS — M79602 Pain in left arm: Secondary | ICD-10-CM

## 2015-11-17 DIAGNOSIS — Z85118 Personal history of other malignant neoplasm of bronchus and lung: Secondary | ICD-10-CM | POA: Diagnosis not present

## 2015-11-17 DIAGNOSIS — Z7901 Long term (current) use of anticoagulants: Secondary | ICD-10-CM

## 2015-11-17 DIAGNOSIS — T85848S Pain due to other internal prosthetic devices, implants and grafts, sequela: Secondary | ICD-10-CM

## 2015-11-17 DIAGNOSIS — C50411 Malignant neoplasm of upper-outer quadrant of right female breast: Secondary | ICD-10-CM | POA: Diagnosis not present

## 2015-11-17 LAB — COMPREHENSIVE METABOLIC PANEL
ALT: 18 U/L (ref 14–54)
AST: 19 U/L (ref 15–41)
Albumin: 3.5 g/dL (ref 3.5–5.0)
Alkaline Phosphatase: 184 U/L — ABNORMAL HIGH (ref 38–126)
Anion gap: 7 (ref 5–15)
BILIRUBIN TOTAL: 0.3 mg/dL (ref 0.3–1.2)
BUN: 37 mg/dL — ABNORMAL HIGH (ref 6–20)
CHLORIDE: 99 mmol/L — AB (ref 101–111)
CO2: 26 mmol/L (ref 22–32)
CREATININE: 0.88 mg/dL (ref 0.44–1.00)
Calcium: 8.2 mg/dL — ABNORMAL LOW (ref 8.9–10.3)
Glucose, Bld: 219 mg/dL — ABNORMAL HIGH (ref 65–99)
POTASSIUM: 3.3 mmol/L — AB (ref 3.5–5.1)
Sodium: 132 mmol/L — ABNORMAL LOW (ref 135–145)
TOTAL PROTEIN: 7.3 g/dL (ref 6.5–8.1)

## 2015-11-17 LAB — CBC WITH DIFFERENTIAL/PLATELET
BASOS PCT: 1 %
Basophils Absolute: 0 10*3/uL (ref 0–0.1)
EOS ABS: 0.1 10*3/uL (ref 0–0.7)
EOS PCT: 2 %
HCT: 39.4 % (ref 35.0–47.0)
Hemoglobin: 13.4 g/dL (ref 12.0–16.0)
LYMPHS ABS: 1.9 10*3/uL (ref 1.0–3.6)
LYMPHS PCT: 40 %
MCH: 28.1 pg (ref 26.0–34.0)
MCHC: 34 g/dL (ref 32.0–36.0)
MCV: 82.5 fL (ref 80.0–100.0)
MONOS PCT: 5 %
Monocytes Absolute: 0.2 10*3/uL (ref 0.2–0.9)
NEUTROS PCT: 52 %
Neutro Abs: 2.5 10*3/uL (ref 1.4–6.5)
PLATELETS: 95 10*3/uL — AB (ref 150–440)
RBC: 4.77 MIL/uL (ref 3.80–5.20)
RDW: 16.3 % — ABNORMAL HIGH (ref 11.5–14.5)
WBC: 4.7 10*3/uL (ref 3.6–11.0)

## 2015-11-17 MED ORDER — DIPHENHYDRAMINE HCL 50 MG/ML IJ SOLN
50.0000 mg | Freq: Once | INTRAMUSCULAR | Status: AC
  Administered 2015-11-17: 50 mg via INTRAVENOUS
  Filled 2015-11-17: qty 1

## 2015-11-17 MED ORDER — FAMOTIDINE IN NACL 20-0.9 MG/50ML-% IV SOLN
20.0000 mg | Freq: Once | INTRAVENOUS | Status: AC
  Administered 2015-11-17: 20 mg via INTRAVENOUS
  Filled 2015-11-17: qty 50

## 2015-11-17 MED ORDER — SODIUM CHLORIDE 0.9% FLUSH
10.0000 mL | INTRAVENOUS | Status: DC | PRN
  Administered 2015-11-17: 10 mL
  Filled 2015-11-17: qty 10

## 2015-11-17 MED ORDER — PALONOSETRON HCL INJECTION 0.25 MG/5ML
0.2500 mg | Freq: Once | INTRAVENOUS | Status: AC
  Administered 2015-11-17: 0.25 mg via INTRAVENOUS
  Filled 2015-11-17: qty 5

## 2015-11-17 MED ORDER — PACLITAXEL CHEMO INJECTION 300 MG/50ML
80.0000 mg/m2 | Freq: Once | INTRAVENOUS | Status: AC
  Administered 2015-11-17: 168 mg via INTRAVENOUS
  Filled 2015-11-17: qty 28

## 2015-11-17 MED ORDER — SODIUM CHLORIDE 0.9 % IV SOLN
242.2000 mg | Freq: Once | INTRAVENOUS | Status: AC
  Administered 2015-11-17: 240 mg via INTRAVENOUS
  Filled 2015-11-17: qty 24

## 2015-11-17 MED ORDER — SODIUM CHLORIDE 0.9 % IV SOLN
Freq: Once | INTRAVENOUS | Status: AC
  Administered 2015-11-17: 11:00:00 via INTRAVENOUS
  Filled 2015-11-17: qty 1000

## 2015-11-17 MED ORDER — HEPARIN SOD (PORK) LOCK FLUSH 100 UNIT/ML IV SOLN
500.0000 [IU] | Freq: Once | INTRAVENOUS | Status: AC | PRN
  Administered 2015-11-17: 500 [IU]
  Filled 2015-11-17: qty 5

## 2015-11-17 MED ORDER — HYDROCODONE-ACETAMINOPHEN 5-325 MG PO TABS: 1.0000 | ORAL_TABLET | Freq: Two times a day (BID) | ORAL | Status: DC | PRN

## 2015-11-17 MED ORDER — SODIUM CHLORIDE 0.9 % IV SOLN
10.0000 mg | Freq: Once | INTRAVENOUS | Status: AC
  Administered 2015-11-17: 10 mg via INTRAVENOUS
  Filled 2015-11-17: qty 1

## 2015-11-17 NOTE — Progress Notes (Signed)
Chaperoned provider with Breast Exam

## 2015-11-17 NOTE — Progress Notes (Signed)
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   # Smoker; ? Paranoia   INTERVAL HISTORY:  65year old female patient with above history of newly diagnosed breast cancer- triple negative; clinical stage II- currently on weekly carbotaxol is here for follow-up. Patient is post 4 treatments.  Patient feels her breast mass is getting smaller in size.   She continues to complain of pain in the left port area. No erythema no distress. Mostly with movement/ and axis and accessing the port. She has been getting hydrocodone one pill twice a day.  Patient denies any tingling and numbness. Denies any nausea vomiting. No hair loss yet. Otherwise patient denies any unusual shortness of breath or chest pain or cough.   REVIEW OF SYSTEMS:  A complete 10 point review of system is done which is negative except mentioned above/history of present illness.   PAST MEDICAL HISTORY :  Past Medical History  Diagnosis Date  . Hypertension   . DVT (deep venous thrombosis) (HCC)     LEFT LEG   . [place]) ([place]   . Diabetes mellitus without complication ([facility]   . Seizure disorder ([facility]   . Coronary artery disease     a. NSTEMI cath 08/02/14: LM nl, pLAD 50%, mLCx 30%, mRCA 95% s/p PCI/DES, 2nd lesion 40%, EF 55%  . HLD (hyperlipidemia)   . Lung nodule   . Anxiety   . Seizures ([facility]   . Squamous cell lung cancer ([place] 04/23/2015    PAST SURGICAL HISTORY :   Past Surgical History  Procedure Laterality Date  . Abdominal hysterectomy      PARTIAL    . Leg surgery Right     BLOOD CLOT  . Cardiac catheterization  08/02/2014  . Coronary angioplasty  08/02/2014    drug eluting stent placement  . Ivc filter placement ([facility] hx)    . Portacath placement Left 10/13/2015    Procedure: INSERTION PORT-A-CATH;  Surgeon: CHubbard Robinson MD;  Location: [facility] ORS;  Service: General;  Laterality: Left;    FAMILY HISTORY :   Family History  Problem Relation Age of Onset  . Heart attack Mother   . Breast cancer Neg Hx     SOCIAL HISTORY:   Social History  Substance Use Topics  . Smoking status: Current Every Day Smoker -- 1.00 packs/day for 45 years    Types: Cigarettes  . Smokeless tobacco: Never Used  . Alcohol Use: No    ALLERGIES:  is allergic to penicillins.  MEDICATIONS:  Current Outpatient Prescriptions  Medication Sig Dispense Refill  . apixaban (ELIQUIS) 2.5 MG TABS tablet Take 2.5 mg by mouth 2 (two) times daily.    .Marland Kitchenatorvastatin (LIPITOR) 40 MG tablet Take 1 tablet by mouth 1 day or 1 dose.    . diazepam (VALIUM) 5 MG tablet Take 1 tablet by mouth 2 (two) times daily as needed.    . empagliflozin (JARDIANCE) 25 MG TABS tablet Take 25 mg by mouth daily.    .Marland Kitchen  HYDROcodone-acetaminophen (NORCO/VICODIN) 5-325 MG tablet Take 1 tablet by mouth every 12 (twelve) hours as needed. For pain 30 tablet 0  . lidocaine-prilocaine (EMLA) cream Apply 1 application topically as needed. Apply to port a cath site 1 hour before chemotherapy treatment. 30 g 6  . lisinopril-hydrochlorothiazide (PRINZIDE,ZESTORETIC) 20-25 MG per tablet Take 1 tablet by mouth daily.     . metFORMIN (GLUCOPHAGE) 1000 MG tablet Take 1 tablet by mouth 2 (two) times daily. Reported on 10/08/2015    . naproxen (NAPROSYN) 500 MG tablet Take 1 tablet by mouth 2 (two) times daily. Reported on 11/03/2015    . nitroGLYCERIN (NITROSTAT) 0.4 MG SL tablet Place 0.4 mg under the tongue every 5 (five) minutes as needed for chest pain. Reported on 11/03/2015    . ondansetron  (ZOFRAN) 8 MG tablet Take 1 tablet (8 mg total) by mouth every 8 (eight) hours as needed for nausea or vomiting (start 3 days; after chemo). 40 tablet 0  . phenytoin (DILANTIN) 100 MG ER capsule Take 300 mg by mouth daily.     . pioglitazone (ACTOS) 45 MG tablet Take 1 tablet by mouth daily.    . prochlorperazine (COMPAZINE) 10 MG tablet Take 1 tablet (10 mg total) by mouth every 6 (six) hours as needed for nausea or vomiting. 40 tablet 0   No current facility-administered medications for this visit.   Facility-Administered Medications Ordered in Other Visits  Medication Dose Route Frequency Provider Last Rate Last Dose  . albuterol (PROVENTIL) (2.5 MG/3ML) 0.083% nebulizer solution 2.5 mg  2.5 mg Nebulization Once Nestor Lewandowsky, MD        PHYSICAL EXAMINATION: ECOG PERFORMANCE STATUS: 0 - Asymptomatic  BP 124/75 mmHg  Temp(Src) 97.1 F (36.2 C) (Tympanic)  Resp 16  Wt 203 lb 14.8 oz (92.5 kg)  Filed Weights   11/17/15 1001  Weight: 203 lb 14.8 oz (92.5 kg)    GENERAL: Well-nourished well-developed; Alert, no distress and comfortable. Alone;  EYES: no pallor or icterus OROPHARYNX: no thrush or ulceration; good dentition  NECK: supple, no masses felt LYMPH: no palpable lymphadenopathy in the cervical, axillary or inguinal regions LUNGS: clear to auscultation and No wheeze or crackles HEART/CVS: regular rate & rhythm and no murmurs; No lower extremity edema ABDOMEN:abdomen soft, non-tender and normal bowel sounds Musculoskeletal:no cyanosis of digits and no clubbing  PSYCH: alert & oriented x 3 with fluent speech NEURO: no focal motor/sensory deficits SKIN: no rashes or significant lesions.  Right Breast- 5 x5 cm breast mass; mobile [smaller]   LABORATORY DATA:  I have reviewed the data as listed    Component Value Date/Time   NA 132* 11/17/2015 0856   NA 141 08/03/2014 0050   K 3.3* 11/17/2015 0856   K 3.3* 08/03/2014 0050   CL 99* 11/17/2015 0856   CL 108*  08/03/2014 0050   CO2 26 11/17/2015 0856   CO2 27 08/03/2014 0050   GLUCOSE 219* 11/17/2015 0856   GLUCOSE 164* 08/03/2014 0050   BUN 37* 11/17/2015 0856   BUN 14 08/03/2014 0050   CREATININE 0.88 11/17/2015 0856   CREATININE 0.68 08/03/2014 0050   CALCIUM 8.2* 11/17/2015 0856   CALCIUM 8.2* 08/03/2014 0050   PROT 7.3 11/17/2015 0856   PROT 6.7 04/11/2013 2241   ALBUMIN 3.5 11/17/2015 0856   ALBUMIN 2.7* 04/11/2013 2241   AST 19 11/17/2015 0856   AST 13* 04/11/2013 2241   ALT 18 11/17/2015 0856   ALT 16 04/11/2013 2241   ALKPHOS 184*  11/17/2015 0856   ALKPHOS 207* 04/11/2013 2241   BILITOT 0.3 11/17/2015 0856   BILITOT 0.1* 04/11/2013 2241   GFRNONAA >60 11/17/2015 0856   GFRNONAA >60 08/03/2014 0050   GFRNONAA >60 04/11/2013 2241   GFRAA >60 11/17/2015 0856   GFRAA >60 08/03/2014 0050   GFRAA >60 04/11/2013 2241    No results found for: SPEP, UPEP  Lab Results  Component Value Date   WBC 4.7 11/17/2015   NEUTROABS 2.5 11/17/2015   HGB 13.4 11/17/2015   HCT 39.4 11/17/2015   MCV 82.5 11/17/2015   PLT 95* 11/17/2015      Chemistry      Component Value Date/Time   NA 132* 11/17/2015 0856   NA 141 08/03/2014 0050   K 3.3* 11/17/2015 0856   K 3.3* 08/03/2014 0050   CL 99* 11/17/2015 0856   CL 108* 08/03/2014 0050   CO2 26 11/17/2015 0856   CO2 27 08/03/2014 0050   BUN 37* 11/17/2015 0856   BUN 14 08/03/2014 0050   CREATININE 0.88 11/17/2015 0856   CREATININE 0.68 08/03/2014 0050      Component Value Date/Time   CALCIUM 8.2* 11/17/2015 0856   CALCIUM 8.2* 08/03/2014 0050   ALKPHOS 184* 11/17/2015 0856   ALKPHOS 207* 04/11/2013 2241   AST 19 11/17/2015 0856   AST 13* 04/11/2013 2241   ALT 18 11/17/2015 0856   ALT 16 04/11/2013 2241   BILITOT 0.3 11/17/2015 0856   BILITOT 0.1* 04/11/2013 2241        ASSESSMENT & PLAN:   # Breast Ca- ER/PR- Neg; Her2-NEG. T3N0- STAGE II; patient has a large primary breast malignancy [7-8 cm in size]; triple  negative.  Patient seems to be tolerating chemotherapy fairly well. Clinically the breast mass seems to be getting smaller. We will plan to do an follow-up ultrasound on the breast after finishing carbotaxol.  # Proceed with  Carboplatin AUC 2- Taxol 80 mg/m weekly;  Cycle #5 today.  CBC/CMP unremarkable except for elevated blood glucose of 219.; Platelets 95.  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  # Left arm pain/mediport placement-taking hydrocodone one pill every 12 hours  New prescription for hydrocodone given.   #  I'll see the patient back in 2 weeks/ CBC CMP/ chemotherapy;  Next week-  CBC BMP/ chemotherapy.      Cammie Sickle, MD 11/17/2015 10:19 AM

## 2015-11-17 NOTE — Progress Notes (Signed)
Patient has nausea that is relieved by her medication.  She reports having constipation with her last bowel movement being 9 days ago.   Still having pain in the port area especially to the touch.

## 2015-11-24 ENCOUNTER — Inpatient Hospital Stay: Payer: Medicare Other

## 2015-11-24 ENCOUNTER — Other Ambulatory Visit: Payer: Self-pay | Admitting: Internal Medicine

## 2015-11-24 ENCOUNTER — Encounter: Payer: Self-pay | Admitting: *Deleted

## 2015-11-24 ENCOUNTER — Inpatient Hospital Stay: Payer: Medicare Other | Attending: Internal Medicine

## 2015-11-24 VITALS — BP 126/77 | HR 89 | Temp 97.3°F | Resp 18

## 2015-11-24 DIAGNOSIS — R252 Cramp and spasm: Secondary | ICD-10-CM | POA: Diagnosis not present

## 2015-11-24 DIAGNOSIS — E119 Type 2 diabetes mellitus without complications: Secondary | ICD-10-CM | POA: Diagnosis not present

## 2015-11-24 DIAGNOSIS — C50411 Malignant neoplasm of upper-outer quadrant of right female breast: Secondary | ICD-10-CM | POA: Insufficient documentation

## 2015-11-24 DIAGNOSIS — Z7901 Long term (current) use of anticoagulants: Secondary | ICD-10-CM | POA: Insufficient documentation

## 2015-11-24 DIAGNOSIS — Z171 Estrogen receptor negative status [ER-]: Secondary | ICD-10-CM | POA: Diagnosis not present

## 2015-11-24 DIAGNOSIS — Z923 Personal history of irradiation: Secondary | ICD-10-CM | POA: Diagnosis not present

## 2015-11-24 DIAGNOSIS — I252 Old myocardial infarction: Secondary | ICD-10-CM | POA: Diagnosis not present

## 2015-11-24 DIAGNOSIS — I10 Essential (primary) hypertension: Secondary | ICD-10-CM | POA: Diagnosis not present

## 2015-11-24 DIAGNOSIS — D696 Thrombocytopenia, unspecified: Secondary | ICD-10-CM | POA: Diagnosis not present

## 2015-11-24 DIAGNOSIS — Z85850 Personal history of malignant neoplasm of thyroid: Secondary | ICD-10-CM | POA: Insufficient documentation

## 2015-11-24 DIAGNOSIS — M79604 Pain in right leg: Secondary | ICD-10-CM | POA: Diagnosis not present

## 2015-11-24 DIAGNOSIS — Z85118 Personal history of other malignant neoplasm of bronchus and lung: Secondary | ICD-10-CM | POA: Diagnosis not present

## 2015-11-24 DIAGNOSIS — Z5111 Encounter for antineoplastic chemotherapy: Secondary | ICD-10-CM | POA: Diagnosis present

## 2015-11-24 DIAGNOSIS — M79605 Pain in left leg: Secondary | ICD-10-CM | POA: Diagnosis not present

## 2015-11-24 DIAGNOSIS — C50921 Malignant neoplasm of unspecified site of right male breast: Secondary | ICD-10-CM

## 2015-11-24 DIAGNOSIS — C50819 Malignant neoplasm of overlapping sites of unspecified female breast: Secondary | ICD-10-CM

## 2015-11-24 DIAGNOSIS — K5900 Constipation, unspecified: Secondary | ICD-10-CM | POA: Insufficient documentation

## 2015-11-24 DIAGNOSIS — F17210 Nicotine dependence, cigarettes, uncomplicated: Secondary | ICD-10-CM | POA: Insufficient documentation

## 2015-11-24 DIAGNOSIS — E785 Hyperlipidemia, unspecified: Secondary | ICD-10-CM | POA: Diagnosis not present

## 2015-11-24 DIAGNOSIS — I2510 Atherosclerotic heart disease of native coronary artery without angina pectoris: Secondary | ICD-10-CM | POA: Insufficient documentation

## 2015-11-24 DIAGNOSIS — Z79899 Other long term (current) drug therapy: Secondary | ICD-10-CM | POA: Insufficient documentation

## 2015-11-24 DIAGNOSIS — Z86718 Personal history of other venous thrombosis and embolism: Secondary | ICD-10-CM | POA: Diagnosis not present

## 2015-11-24 DIAGNOSIS — Z7984 Long term (current) use of oral hypoglycemic drugs: Secondary | ICD-10-CM | POA: Diagnosis not present

## 2015-11-24 DIAGNOSIS — G40909 Epilepsy, unspecified, not intractable, without status epilepticus: Secondary | ICD-10-CM | POA: Diagnosis not present

## 2015-11-24 LAB — CBC WITH DIFFERENTIAL/PLATELET
BASOS ABS: 0 10*3/uL (ref 0–0.1)
BASOS PCT: 1 %
Eosinophils Absolute: 0.1 10*3/uL (ref 0–0.7)
Eosinophils Relative: 2 %
HEMATOCRIT: 37.6 % (ref 35.0–47.0)
Hemoglobin: 12.9 g/dL (ref 12.0–16.0)
LYMPHS PCT: 49 %
Lymphs Abs: 1.8 10*3/uL (ref 1.0–3.6)
MCH: 28.3 pg (ref 26.0–34.0)
MCHC: 34.2 g/dL (ref 32.0–36.0)
MCV: 82.8 fL (ref 80.0–100.0)
MONO ABS: 0.2 10*3/uL (ref 0.2–0.9)
Monocytes Relative: 5 %
NEUTROS ABS: 1.6 10*3/uL (ref 1.4–6.5)
Neutrophils Relative %: 43 %
PLATELETS: 89 10*3/uL — AB (ref 150–440)
RBC: 4.55 MIL/uL (ref 3.80–5.20)
RDW: 16 % — AB (ref 11.5–14.5)
WBC: 3.7 10*3/uL (ref 3.6–11.0)

## 2015-11-24 LAB — BASIC METABOLIC PANEL
ANION GAP: 7 (ref 5–15)
BUN: 31 mg/dL — ABNORMAL HIGH (ref 6–20)
CALCIUM: 8.5 mg/dL — AB (ref 8.9–10.3)
CO2: 26 mmol/L (ref 22–32)
Chloride: 102 mmol/L (ref 101–111)
Creatinine, Ser: 0.89 mg/dL (ref 0.44–1.00)
Glucose, Bld: 192 mg/dL — ABNORMAL HIGH (ref 65–99)
POTASSIUM: 3.3 mmol/L — AB (ref 3.5–5.1)
Sodium: 135 mmol/L (ref 135–145)

## 2015-11-24 MED ORDER — PACLITAXEL CHEMO INJECTION 300 MG/50ML
80.0000 mg/m2 | Freq: Once | INTRAVENOUS | Status: AC
  Administered 2015-11-24: 168 mg via INTRAVENOUS
  Filled 2015-11-24: qty 28

## 2015-11-24 MED ORDER — PALONOSETRON HCL INJECTION 0.25 MG/5ML
0.2500 mg | Freq: Once | INTRAVENOUS | Status: AC
  Administered 2015-11-24: 0.25 mg via INTRAVENOUS
  Filled 2015-11-24: qty 5

## 2015-11-24 MED ORDER — HEPARIN SOD (PORK) LOCK FLUSH 100 UNIT/ML IV SOLN
500.0000 [IU] | Freq: Once | INTRAVENOUS | Status: AC
  Administered 2015-11-24: 500 [IU] via INTRAVENOUS
  Filled 2015-11-24: qty 5

## 2015-11-24 MED ORDER — FAMOTIDINE IN NACL 20-0.9 MG/50ML-% IV SOLN
20.0000 mg | Freq: Once | INTRAVENOUS | Status: AC
  Administered 2015-11-24: 20 mg via INTRAVENOUS
  Filled 2015-11-24: qty 50

## 2015-11-24 MED ORDER — SODIUM CHLORIDE 0.9% FLUSH
10.0000 mL | INTRAVENOUS | Status: DC | PRN
  Filled 2015-11-24: qty 10

## 2015-11-24 MED ORDER — SODIUM CHLORIDE 0.9 % IV SOLN
10.0000 mg | Freq: Once | INTRAVENOUS | Status: AC
  Administered 2015-11-24: 10 mg via INTRAVENOUS
  Filled 2015-11-24: qty 1

## 2015-11-24 MED ORDER — SODIUM CHLORIDE 0.9 % IV SOLN
Freq: Once | INTRAVENOUS | Status: AC
  Administered 2015-11-24: 11:00:00 via INTRAVENOUS
  Filled 2015-11-24: qty 1000

## 2015-11-24 MED ORDER — HEPARIN SOD (PORK) LOCK FLUSH 100 UNIT/ML IV SOLN
500.0000 [IU] | Freq: Once | INTRAVENOUS | Status: DC | PRN
  Filled 2015-11-24: qty 5

## 2015-11-24 MED ORDER — SODIUM CHLORIDE 0.9 % IV SOLN
240.0000 mg | Freq: Once | INTRAVENOUS | Status: AC
  Administered 2015-11-24: 240 mg via INTRAVENOUS
  Filled 2015-11-24: qty 24

## 2015-11-24 MED ORDER — SODIUM CHLORIDE 0.9 % IV SOLN: 240.2000 mg | Freq: Once | INTRAVENOUS | Status: DC

## 2015-11-24 MED ORDER — DIPHENHYDRAMINE HCL 50 MG/ML IJ SOLN
50.0000 mg | Freq: Once | INTRAMUSCULAR | Status: AC
  Administered 2015-11-24: 50 mg via INTRAVENOUS
  Filled 2015-11-24: qty 1

## 2015-11-24 MED ORDER — SODIUM CHLORIDE 0.9% FLUSH
10.0000 mL | INTRAVENOUS | Status: DC | PRN
  Administered 2015-11-24: 10 mL via INTRAVENOUS
  Filled 2015-11-24: qty 10

## 2015-11-24 NOTE — Progress Notes (Signed)
  Oncology Nurse Navigator Documentation  Navigator Location: [facility] (11/24/15 1500) Navigator Encounter Type: Clinic/MDC (11/24/15 1500)           Patient Visit Type: Other (11/24/15 1500)   Barriers/Navigation Needs: Coordination of Care (11/24/15 1500)                Acuity: Level 4 (11/24/15 1500)         Time Spent with Patient: 90 (11/24/15 1500)   Patient upset and angry today.  Myself, Webb Silversmith and Raquel Sarna spent time with her to calm her down prior to treatment.

## 2015-11-24 NOTE — Progress Notes (Signed)
Per Dr. Burlene Arnt ok to treat patient today with [place] and taxol with plts of 89.

## 2015-12-01 ENCOUNTER — Encounter: Payer: Self-pay | Admitting: *Deleted

## 2015-12-01 ENCOUNTER — Inpatient Hospital Stay: Payer: Medicare Other

## 2015-12-01 ENCOUNTER — Inpatient Hospital Stay (HOSPITAL_BASED_OUTPATIENT_CLINIC_OR_DEPARTMENT_OTHER): Payer: Medicare Other | Admitting: Internal Medicine

## 2015-12-01 VITALS — BP 145/83 | HR 82 | Temp 97.4°F | Resp 20 | Wt 203.5 lb

## 2015-12-01 DIAGNOSIS — I10 Essential (primary) hypertension: Secondary | ICD-10-CM

## 2015-12-01 DIAGNOSIS — Z86718 Personal history of other venous thrombosis and embolism: Secondary | ICD-10-CM

## 2015-12-01 DIAGNOSIS — C50411 Malignant neoplasm of upper-outer quadrant of right female breast: Secondary | ICD-10-CM

## 2015-12-01 DIAGNOSIS — Z923 Personal history of irradiation: Secondary | ICD-10-CM

## 2015-12-01 DIAGNOSIS — Z171 Estrogen receptor negative status [ER-]: Secondary | ICD-10-CM

## 2015-12-01 DIAGNOSIS — E119 Type 2 diabetes mellitus without complications: Secondary | ICD-10-CM

## 2015-12-01 DIAGNOSIS — D696 Thrombocytopenia, unspecified: Secondary | ICD-10-CM

## 2015-12-01 DIAGNOSIS — K5900 Constipation, unspecified: Secondary | ICD-10-CM

## 2015-12-01 DIAGNOSIS — Z7984 Long term (current) use of oral hypoglycemic drugs: Secondary | ICD-10-CM

## 2015-12-01 DIAGNOSIS — C50921 Malignant neoplasm of unspecified site of right male breast: Secondary | ICD-10-CM

## 2015-12-01 DIAGNOSIS — I2510 Atherosclerotic heart disease of native coronary artery without angina pectoris: Secondary | ICD-10-CM

## 2015-12-01 DIAGNOSIS — C50819 Malignant neoplasm of overlapping sites of unspecified female breast: Secondary | ICD-10-CM

## 2015-12-01 DIAGNOSIS — Z85118 Personal history of other malignant neoplasm of bronchus and lung: Secondary | ICD-10-CM | POA: Diagnosis not present

## 2015-12-01 DIAGNOSIS — R252 Cramp and spasm: Secondary | ICD-10-CM

## 2015-12-01 DIAGNOSIS — Z5111 Encounter for antineoplastic chemotherapy: Secondary | ICD-10-CM | POA: Diagnosis not present

## 2015-12-01 LAB — COMPREHENSIVE METABOLIC PANEL
ALBUMIN: 3.3 g/dL — AB (ref 3.5–5.0)
ALK PHOS: 192 U/L — AB (ref 38–126)
ALT: 18 U/L (ref 14–54)
ANION GAP: 6 (ref 5–15)
AST: 18 U/L (ref 15–41)
BUN: 32 mg/dL — ABNORMAL HIGH (ref 6–20)
CO2: 27 mmol/L (ref 22–32)
CREATININE: 0.87 mg/dL (ref 0.44–1.00)
Calcium: 8.4 mg/dL — ABNORMAL LOW (ref 8.9–10.3)
Chloride: 100 mmol/L — ABNORMAL LOW (ref 101–111)
GFR calc non Af Amer: >60 mL/min (ref >60)
GLUCOSE: 252 mg/dL — AB (ref 65–99)
Potassium: 3.5 mmol/L (ref 3.5–5.1)
SODIUM: 133 mmol/L — AB (ref 135–145)
TOTAL PROTEIN: 7 g/dL (ref 6.5–8.1)
Total Bilirubin: 0.3 mg/dL (ref 0.3–1.2)

## 2015-12-01 LAB — CBC WITH DIFFERENTIAL/PLATELET
BASOS ABS: 0 10*3/uL (ref 0–0.1)
BASOS PCT: 1 %
EOS ABS: 0 10*3/uL (ref 0–0.7)
Eosinophils Relative: 2 %
HCT: 34.7 % — ABNORMAL LOW (ref 35.0–47.0)
HEMOGLOBIN: 12 g/dL (ref 12.0–16.0)
Lymphocytes Relative: 50 %
Lymphs Abs: 1.2 10*3/uL (ref 1.0–3.6)
MCH: 28.5 pg (ref 26.0–34.0)
MCHC: 34.4 g/dL (ref 32.0–36.0)
MCV: 82.9 fL (ref 80.0–100.0)
MONO ABS: 0.2 10*3/uL (ref 0.2–0.9)
MONOS PCT: 7 %
NEUTROS PCT: 42 %
Neutro Abs: 1 10*3/uL — ABNORMAL LOW (ref 1.4–6.5)
Platelets: 132 10*3/uL — ABNORMAL LOW (ref 150–440)
RBC: 4.19 MIL/uL (ref 3.80–5.20)
RDW: 15.7 % — AB (ref 11.5–14.5)
WBC: 2.5 10*3/uL — ABNORMAL LOW (ref 3.6–11.0)

## 2015-12-01 MED ORDER — FAMOTIDINE IN NACL 20-0.9 MG/50ML-% IV SOLN
20.0000 mg | Freq: Once | INTRAVENOUS | Status: AC
  Administered 2015-12-01: 20 mg via INTRAVENOUS
  Filled 2015-12-01: qty 50

## 2015-12-01 MED ORDER — HEPARIN SOD (PORK) LOCK FLUSH 100 UNIT/ML IV SOLN
500.0000 [IU] | Freq: Once | INTRAVENOUS | Status: DC | PRN
  Filled 2015-12-01: qty 5

## 2015-12-01 MED ORDER — SODIUM CHLORIDE 0.9% FLUSH
10.0000 mL | INTRAVENOUS | Status: AC | PRN
  Administered 2015-12-01: 10 mL via INTRAVENOUS
  Filled 2015-12-01: qty 10

## 2015-12-01 MED ORDER — SODIUM CHLORIDE 0.9 % IV SOLN
Freq: Once | INTRAVENOUS | Status: AC
  Administered 2015-12-01: 11:00:00 via INTRAVENOUS
  Filled 2015-12-01: qty 1000

## 2015-12-01 MED ORDER — PACLITAXEL CHEMO INJECTION 300 MG/50ML
80.0000 mg/m2 | Freq: Once | INTRAVENOUS | Status: AC
  Administered 2015-12-01: 168 mg via INTRAVENOUS
  Filled 2015-12-01: qty 28

## 2015-12-01 MED ORDER — HEPARIN SOD (PORK) LOCK FLUSH 100 UNIT/ML IV SOLN
500.0000 [IU] | Freq: Once | INTRAVENOUS | Status: AC
  Administered 2015-12-01: 500 [IU] via INTRAVENOUS

## 2015-12-01 MED ORDER — DEXAMETHASONE SODIUM PHOSPHATE 100 MG/10ML IJ SOLN
10.0000 mg | Freq: Once | INTRAMUSCULAR | Status: AC
Start: 2015-12-01 — End: 2015-12-01
  Administered 2015-12-01: 10 mg via INTRAVENOUS
  Filled 2015-12-01: qty 1

## 2015-12-01 MED ORDER — HYDROCODONE-ACETAMINOPHEN 5-325 MG PO TABS: 1.0000 | ORAL_TABLET | Freq: Two times a day (BID) | ORAL | Status: DC | PRN

## 2015-12-01 MED ORDER — PALONOSETRON HCL INJECTION 0.25 MG/5ML
0.2500 mg | Freq: Once | INTRAVENOUS | Status: AC
  Administered 2015-12-01: 0.25 mg via INTRAVENOUS
  Filled 2015-12-01: qty 5

## 2015-12-01 MED ORDER — SODIUM CHLORIDE 0.9 % IV SOLN
244.6000 mg | Freq: Once | INTRAVENOUS | Status: AC
  Administered 2015-12-01: 240 mg via INTRAVENOUS
  Filled 2015-12-01: qty 24

## 2015-12-01 MED ORDER — DIPHENHYDRAMINE HCL 50 MG/ML IJ SOLN
50.0000 mg | Freq: Once | INTRAMUSCULAR | Status: AC
  Administered 2015-12-01: 50 mg via INTRAVENOUS
  Filled 2015-12-01: qty 1

## 2015-12-01 NOTE — Progress Notes (Signed)
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   # Smoker; ? Paranoia   INTERVAL HISTORY:  65year old female patient with above history of newly diagnosed breast cancer- triple negative; clinical stage II- currently on weekly carbotaxol is here for follow-up. Patient is post 7 treatments.  Patient feels her breast mass is getting smaller in size.   Complains of constipation; on dulcolax. Complains of muscle cramps legs/body aches. positive for hair loss. Patient denies any tingling and numbness. Denies any nausea vomiting. No hair loss yet. Otherwise patient denies any unusual shortness of breath or chest pain or cough.   REVIEW OF SYSTEMS:  A complete 10 point review of system is done which is negative except mentioned above/history of present illness.   PAST MEDICAL HISTORY :  Past Medical History  Diagnosis Date  . Hypertension   . DVT (deep venous thrombosis) (HCC)     LEFT LEG   . [place] ([place]ocardial infarction) ([facility]   . Diabetes mellitus without complication ([facility]   . Seizure disorder ([facility]   . Coronary artery disease     a. NSTEMI cath 08/02/14: LM nl, pLAD 50%, mLCx 30%, mRCA 95% s/p PCI/DES, 2nd lesion 40%, EF 55%  . HLD (hyperlipidemia)   . Lung nodule   . Anxiety   . Seizures ([facility]   . Squamous cell lung cancer ([facility] 04/23/2015    PAST SURGICAL HISTORY :   Past Surgical History  Procedure Laterality Date  . Abdominal hysterectomy      PARTIAL   . Leg surgery Right     BLOOD CLOT  . Cardiac catheterization  08/02/2014  .  Coronary angioplasty  08/02/2014    drug eluting stent placement  . Ivc filter placement ([facility] hx)    . Portacath placement Left 10/13/2015    Procedure: INSERTION PORT-A-CATH;  Surgeon: CHubbard Robinson MD;  Location: [facility] ORS;  Service: General;  Laterality: Left;    FAMILY HISTORY :   Family History  Problem Relation Age of Onset  . Heart attack Mother   . Breast cancer Neg Hx     SOCIAL HISTORY:   Social History  Substance Use Topics  . Smoking status: Current Every Day Smoker -- 1.00 packs/day for 45 years    Types: Cigarettes  . Smokeless tobacco: Never Used  . Alcohol Use: No    ALLERGIES:  is allergic to penicillins.  MEDICATIONS:  Current Outpatient Prescriptions  Medication Sig Dispense Refill  . apixaban (ELIQUIS) 2.5 MG TABS tablet Take 2.5 mg by mouth 2 (two) times daily.    .Marland Kitchenatorvastatin (LIPITOR) 40 MG tablet Take 1 tablet by mouth 1 day or 1 dose.    . diazepam (VALIUM) 5 MG tablet Take 1 tablet by mouth 2 (two) times daily as needed.    . empagliflozin (JARDIANCE) 25 MG TABS tablet Take 25 mg by mouth daily.    .Marland KitchenHYDROcodone-acetaminophen (NORCO/VICODIN) 5-325 MG tablet Take 1 tablet by mouth every 12 (twelve) hours as needed. For pain  30 tablet 0  . lidocaine-prilocaine (EMLA) cream Apply 1 application topically as needed. Apply to port a cath site 1 hour before chemotherapy treatment. 30 g 6  . lisinopril-hydrochlorothiazide (PRINZIDE,ZESTORETIC) 20-25 MG per tablet Take 1 tablet by mouth daily.     . metFORMIN (GLUCOPHAGE) 1000 MG tablet Take 1 tablet by mouth 2 (two) times daily. Reported on 10/08/2015    . naproxen (NAPROSYN) 500 MG tablet Take 1 tablet by mouth 2 (two) times daily. Reported on 11/03/2015    . nitroGLYCERIN (NITROSTAT) 0.4 MG SL tablet Place 0.4 mg under the tongue every 5 (five) minutes as needed for chest pain. Reported on 11/03/2015    . ondansetron (ZOFRAN) 8 MG tablet Take 1 tablet (8 mg total) by mouth every 8 (eight) hours as needed  for nausea or vomiting (start 3 days; after chemo). 40 tablet 0  . phenytoin (DILANTIN) 100 MG ER capsule Take 300 mg by mouth daily.     . pioglitazone (ACTOS) 45 MG tablet Take 1 tablet by mouth daily.    . prochlorperazine (COMPAZINE) 10 MG tablet Take 1 tablet (10 mg total) by mouth every 6 (six) hours as needed for nausea or vomiting. 40 tablet 0   No current facility-administered medications for this visit.   Facility-Administered Medications Ordered in Other Visits  Medication Dose Route Frequency Provider Last Rate Last Dose  . albuterol (PROVENTIL) (2.5 MG/3ML) 0.083% nebulizer solution 2.5 mg  2.5 mg Nebulization Once Nestor Lewandowsky, MD      . heparin lock flush 100 unit/mL  500 Units Intravenous Once Cammie Sickle, MD      . sodium chloride flush (NS) 0.9 % injection 10 mL  10 mL Intravenous PRN Cammie Sickle, MD   10 mL at 12/01/15 0850    PHYSICAL EXAMINATION: ECOG PERFORMANCE STATUS: 0 - Asymptomatic  BP 145/83 mmHg  Pulse 82  Temp(Src) 97.4 F (36.3 C) (Tympanic)  Wt 203 lb 7.8 oz (92.3 kg)  Filed Weights   12/01/15 0924  Weight: 203 lb 7.8 oz (92.3 kg)    GENERAL: Well-nourished well-developed; Alert, no distress and comfortable. Alone;  EYES: no pallor or icterus OROPHARYNX: no thrush or ulceration; good dentition  NECK: supple, no masses felt LYMPH: no palpable lymphadenopathy in the cervical, axillary or inguinal regions LUNGS: clear to auscultation and No wheeze or crackles HEART/CVS: regular rate & rhythm and no murmurs; No lower extremity edema ABDOMEN:abdomen soft, non-tender and normal bowel sounds Musculoskeletal:no cyanosis of digits and no clubbing  PSYCH: alert & oriented x 3 with fluent speech NEURO: no focal motor/sensory deficits SKIN: no rashes or significant lesions.  Right Breast- 4 x5 cm breast mass; mobile [smaller]   LABORATORY DATA:  I have reviewed the data as listed    Component Value Date/Time   NA 133*  12/01/2015 0838   NA 141 08/03/2014 0050   K 3.5 12/01/2015 0838   K 3.3* 08/03/2014 0050   CL 100* 12/01/2015 0838   CL 108* 08/03/2014 0050   CO2 27 12/01/2015 0838   CO2 27 08/03/2014 0050   GLUCOSE 252* 12/01/2015 0838   GLUCOSE 164* 08/03/2014 0050   BUN 32* 12/01/2015 0838   BUN 14 08/03/2014 0050   CREATININE 0.87 12/01/2015 0838   CREATININE 0.68 08/03/2014 0050   CALCIUM 8.4* 12/01/2015 0838   CALCIUM 8.2* 08/03/2014 0050   PROT 7.0 12/01/2015 0838   PROT 6.7 04/11/2013 2241   ALBUMIN 3.3* 12/01/2015 0838   ALBUMIN 2.7* 04/11/2013  2241   AST 18 12/01/2015 0838   AST 13* 04/11/2013 2241   ALT 18 12/01/2015 0838   ALT 16 04/11/2013 2241   ALKPHOS 192* 12/01/2015 0838   ALKPHOS 207* 04/11/2013 2241   BILITOT 0.3 12/01/2015 0838   BILITOT 0.1* 04/11/2013 2241   GFRNONAA >60 12/01/2015 0838   GFRNONAA >60 08/03/2014 0050   GFRNONAA >60 04/11/2013 2241   GFRAA >60 12/01/2015 0838   GFRAA >60 08/03/2014 0050   GFRAA >60 04/11/2013 2241    No results found for: SPEP, UPEP  Lab Results  Component Value Date   WBC 2.5* 12/01/2015   NEUTROABS 1.0* 12/01/2015   HGB 12.0 12/01/2015   HCT 34.7* 12/01/2015   MCV 82.9 12/01/2015   PLT 132* 12/01/2015      Chemistry      Component Value Date/Time   NA 133* 12/01/2015 0838   NA 141 08/03/2014 0050   K 3.5 12/01/2015 0838   K 3.3* 08/03/2014 0050   CL 100* 12/01/2015 0838   CL 108* 08/03/2014 0050   CO2 27 12/01/2015 0838   CO2 27 08/03/2014 0050   BUN 32* 12/01/2015 0838   BUN 14 08/03/2014 0050   CREATININE 0.87 12/01/2015 0838   CREATININE 0.68 08/03/2014 0050      Component Value Date/Time   CALCIUM 8.4* 12/01/2015 0838   CALCIUM 8.2* 08/03/2014 0050   ALKPHOS 192* 12/01/2015 0838   ALKPHOS 207* 04/11/2013 2241   AST 18 12/01/2015 0838   AST 13* 04/11/2013 2241   ALT 18 12/01/2015 0838   ALT 16 04/11/2013 2241   BILITOT 0.3 12/01/2015 0838   BILITOT 0.1* 04/11/2013 2241        ASSESSMENT &  PLAN:   # Breast Ca- ER/PR- Neg; Her2-NEG. T3N0- STAGE II; patient has a large primary breast malignancy [7-8 cm in size]; triple negative.  Patient seems to be tolerating chemotherapy fairly well. Clinically the breast mass seems to be getting smaller. We will plan to do an follow-up ultrasound on the breast after finishing carbotaxol.  # Proceed with  Carboplatin AUC 2- Taxol 80 mg/m weekly; proceed with Cycle #7 today.  CBC/CMP unremarkable except for elevated blood glucose of 252.; Platelets 132.  # ANC- 1.0/ Thrombocytopenia platelets 132  likely from chemotherapy. Monitor for now.  # constipation- likley narcotic induced recommend increased fluid intake/ Dulcolax and MiraLAX.  # Muscle cramps from Taxol. Continue hydrocodone one every 12 hours as needed. New prescription.  #  I'll see the patient back in 2 weeks/ CBC CMP/ chemotherapy;  Next week-  CBC BMP/ chemotherapy.      Cammie Sickle, MD 12/01/2015 10:16 AM

## 2015-12-01 NOTE — Progress Notes (Signed)
Patient complains of fatigue.  Also states her knees feel like they are going to "give out on her". Further states she is having right foot pain.  Patient also having constipation.  States she went 2 weeks without a BM and now it has been a week since she has been.  Has taken Doculax OTC.

## 2015-12-01 NOTE — Progress Notes (Unsigned)
Patients ANC is 1.0, treatment parameters state that [place] needs to be 1.5 or above. [name] stated that patient was to proceed with treatment, verbal order to give treatment.

## 2015-12-01 NOTE — Progress Notes (Signed)
  Oncology Nurse Navigator Documentation  Navigator Location: [facility] (12/01/15 1400) Navigator Encounter Type: Treatment (12/01/15 1400)           Patient Visit Type: Other (12/01/15 1400)     Education: Other (support) (12/01/15 1400)              Acuity: Level 4 (12/01/15 1400)         Time Spent with Patient: 15 (12/01/15 1400)   Met patient today during her chemotherapy treatment.  She is doing well.  No complaints.  She is to call if she has any questions or needs.

## 2015-12-04 ENCOUNTER — Telehealth: Payer: Self-pay | Admitting: *Deleted

## 2015-12-04 MED ORDER — LINACLOTIDE 145 MCG PO CAPS: 145.0000 ug | ORAL_CAPSULE | Freq: Every day | ORAL | Status: DC

## 2015-12-04 NOTE — Telephone Encounter (Signed)
Called to report that she4 has not had a BM in 11 days and is asking if Dr B will order her Linzess, This is something her PCP Dr Brunetta Genera has ordered for her in the past, but he is unable to prescribe narcotics any longer. Please advise

## 2015-12-04 NOTE — Telephone Encounter (Signed)
Linzess 145 mcg daily #30 per VO Dr Rogue Bussing e scribed . Pt notified

## 2015-12-08 ENCOUNTER — Inpatient Hospital Stay: Payer: Medicare Other

## 2015-12-08 ENCOUNTER — Other Ambulatory Visit: Payer: Self-pay | Admitting: Internal Medicine

## 2015-12-08 VITALS — BP 119/77 | HR 81 | Temp 97.5°F | Resp 18

## 2015-12-08 DIAGNOSIS — C50921 Malignant neoplasm of unspecified site of right male breast: Secondary | ICD-10-CM

## 2015-12-08 DIAGNOSIS — Z5111 Encounter for antineoplastic chemotherapy: Secondary | ICD-10-CM | POA: Diagnosis not present

## 2015-12-08 DIAGNOSIS — C50819 Malignant neoplasm of overlapping sites of unspecified female breast: Secondary | ICD-10-CM

## 2015-12-08 LAB — BASIC METABOLIC PANEL
Anion gap: 5 (ref 5–15)
BUN: 24 mg/dL — ABNORMAL HIGH (ref 6–20)
CALCIUM: 8.2 mg/dL — AB (ref 8.9–10.3)
CO2: 26 mmol/L (ref 22–32)
CREATININE: 1.08 mg/dL — AB (ref 0.44–1.00)
Chloride: 101 mmol/L (ref 101–111)
GFR, EST NON AFRICAN AMERICAN: 53 mL/min — AB (ref >60)
GLUCOSE: 252 mg/dL — AB (ref 65–99)
Potassium: 3.5 mmol/L (ref 3.5–5.1)
Sodium: 132 mmol/L — ABNORMAL LOW (ref 135–145)

## 2015-12-08 LAB — CBC WITH DIFFERENTIAL/PLATELET
BASOS ABS: 0 10*3/uL (ref 0–0.1)
Basophils Relative: 0 %
EOS PCT: 1 %
Eosinophils Absolute: 0 10*3/uL (ref 0–0.7)
HEMATOCRIT: 33.7 % — AB (ref 35.0–47.0)
Hemoglobin: 11.5 g/dL — ABNORMAL LOW (ref 12.0–16.0)
Lymphocytes Relative: 49 %
Lymphs Abs: 1.5 10*3/uL (ref 1.0–3.6)
MCH: 28.6 pg (ref 26.0–34.0)
MCHC: 34 g/dL (ref 32.0–36.0)
MCV: 84.2 fL (ref 80.0–100.0)
MONO ABS: 0.3 10*3/uL (ref 0.2–0.9)
MONOS PCT: 12 %
NEUTROS ABS: 1.1 10*3/uL — AB (ref 1.4–6.5)
Neutrophils Relative %: 38 %
PLATELETS: 210 10*3/uL (ref 150–440)
RBC: 4.01 MIL/uL (ref 3.80–5.20)
RDW: 16.9 % — AB (ref 11.5–14.5)
WBC: 2.9 10*3/uL — ABNORMAL LOW (ref 3.6–11.0)

## 2015-12-08 MED ORDER — DIPHENHYDRAMINE HCL 50 MG/ML IJ SOLN
50.0000 mg | Freq: Once | INTRAMUSCULAR | Status: AC
  Administered 2015-12-08: 50 mg via INTRAVENOUS
  Filled 2015-12-08: qty 1

## 2015-12-08 MED ORDER — FAMOTIDINE IN NACL 20-0.9 MG/50ML-% IV SOLN
20.0000 mg | Freq: Once | INTRAVENOUS | Status: AC
  Administered 2015-12-08: 20 mg via INTRAVENOUS
  Filled 2015-12-08: qty 50

## 2015-12-08 MED ORDER — HEPARIN SOD (PORK) LOCK FLUSH 100 UNIT/ML IV SOLN
500.0000 [IU] | Freq: Once | INTRAVENOUS | Status: AC
  Administered 2015-12-08: 500 [IU] via INTRAVENOUS
  Filled 2015-12-08: qty 5

## 2015-12-08 MED ORDER — SODIUM CHLORIDE 0.9% FLUSH
10.0000 mL | INTRAVENOUS | Status: DC | PRN
  Administered 2015-12-08: 10 mL via INTRAVENOUS
  Filled 2015-12-08: qty 10

## 2015-12-08 MED ORDER — PACLITAXEL CHEMO INJECTION 300 MG/50ML
80.0000 mg/m2 | Freq: Once | INTRAVENOUS | Status: AC
  Administered 2015-12-08: 168 mg via INTRAVENOUS
  Filled 2015-12-08: qty 28

## 2015-12-08 MED ORDER — SODIUM CHLORIDE 0.9 % IV SOLN
10.0000 mg | Freq: Once | INTRAVENOUS | Status: AC
  Administered 2015-12-08: 10 mg via INTRAVENOUS
  Filled 2015-12-08: qty 1

## 2015-12-08 MED ORDER — SODIUM CHLORIDE 0.9 % IV SOLN
206.6000 mg | Freq: Once | INTRAVENOUS | Status: DC
  Filled 2015-12-08: qty 21

## 2015-12-08 MED ORDER — SODIUM CHLORIDE 0.9 % IV SOLN
Freq: Once | INTRAVENOUS | Status: AC
  Administered 2015-12-08: 10:00:00 via INTRAVENOUS
  Filled 2015-12-08: qty 1000

## 2015-12-08 MED ORDER — PALONOSETRON HCL INJECTION 0.25 MG/5ML
0.2500 mg | Freq: Once | INTRAVENOUS | Status: AC
  Administered 2015-12-08: 0.25 mg via INTRAVENOUS
  Filled 2015-12-08: qty 5

## 2015-12-08 NOTE — Progress Notes (Signed)
ANC: 1100. MD, [name], notified via telephone and aware. Per MD, [name], order: proceed with Taxol and Carboplatin treatment today.  1145-Patient very agitated and confrontational with staff and other patients/family members at this time. Patient refusing to continue on with treatment today. Patient received Taxol treatment only today. Patient refused to stay for Carboplatin treatment. MD, [name], notified via telephone and aware. Director, Freida Busman, notified and came to talk with the patient before patient left the infusion suite.

## 2015-12-15 ENCOUNTER — Encounter: Payer: Self-pay | Admitting: *Deleted

## 2015-12-15 ENCOUNTER — Inpatient Hospital Stay: Payer: Medicare Other

## 2015-12-15 ENCOUNTER — Inpatient Hospital Stay: Payer: Medicare Other | Admitting: Internal Medicine

## 2015-12-15 NOTE — Progress Notes (Signed)
  Oncology Nurse Navigator Documentation  Navigator Location: [facility] (12/15/15 1300) Navigator Encounter Type: Telephone (12/15/15 1300) Telephone: Incoming Call (12/15/15 1300)         Patient Visit Type: Follow-up (12/15/15 1300)   Barriers/Navigation Needs: Coordination of Care (12/15/15 1300)   Interventions: Coordination of Care (12/15/15 1300)            Acuity: Level 4 (12/15/15 1300)         Time Spent with Patient: 30 (12/15/15 1300)   Patient called and states she could not get a ride for treatment today.  States she was told she could come tomorrow.  Patient is scheduled to receive her treatment in [place] tomorrow.  She was informed that the [name] would pick her up at 9:00, she'll have labs, see the MD and treatment.  She is agreeable.

## 2015-12-16 ENCOUNTER — Ambulatory Visit: Payer: Medicare Other

## 2015-12-16 ENCOUNTER — Encounter: Payer: Self-pay | Admitting: Internal Medicine

## 2015-12-16 ENCOUNTER — Inpatient Hospital Stay: Payer: Medicare Other

## 2015-12-16 ENCOUNTER — Inpatient Hospital Stay (HOSPITAL_BASED_OUTPATIENT_CLINIC_OR_DEPARTMENT_OTHER): Payer: Medicare Other | Admitting: Internal Medicine

## 2015-12-16 VITALS — BP 165/78 | HR 80 | Temp 98.0°F | Resp 20

## 2015-12-16 VITALS — BP 127/63 | HR 78 | Temp 98.1°F | Resp 20 | Ht 65.0 in | Wt 201.3 lb

## 2015-12-16 DIAGNOSIS — Z923 Personal history of irradiation: Secondary | ICD-10-CM

## 2015-12-16 DIAGNOSIS — C50911 Malignant neoplasm of unspecified site of right female breast: Secondary | ICD-10-CM

## 2015-12-16 DIAGNOSIS — C50411 Malignant neoplasm of upper-outer quadrant of right female breast: Secondary | ICD-10-CM | POA: Diagnosis not present

## 2015-12-16 DIAGNOSIS — Z171 Estrogen receptor negative status [ER-]: Secondary | ICD-10-CM | POA: Diagnosis not present

## 2015-12-16 DIAGNOSIS — E119 Type 2 diabetes mellitus without complications: Secondary | ICD-10-CM

## 2015-12-16 DIAGNOSIS — M79604 Pain in right leg: Secondary | ICD-10-CM

## 2015-12-16 DIAGNOSIS — C50819 Malignant neoplasm of overlapping sites of unspecified female breast: Secondary | ICD-10-CM

## 2015-12-16 DIAGNOSIS — C50921 Malignant neoplasm of unspecified site of right male breast: Secondary | ICD-10-CM

## 2015-12-16 DIAGNOSIS — Z5111 Encounter for antineoplastic chemotherapy: Secondary | ICD-10-CM | POA: Diagnosis not present

## 2015-12-16 DIAGNOSIS — Z85118 Personal history of other malignant neoplasm of bronchus and lung: Secondary | ICD-10-CM

## 2015-12-16 DIAGNOSIS — I10 Essential (primary) hypertension: Secondary | ICD-10-CM

## 2015-12-16 DIAGNOSIS — Z7984 Long term (current) use of oral hypoglycemic drugs: Secondary | ICD-10-CM

## 2015-12-16 DIAGNOSIS — K5900 Constipation, unspecified: Secondary | ICD-10-CM

## 2015-12-16 DIAGNOSIS — M79605 Pain in left leg: Secondary | ICD-10-CM

## 2015-12-16 DIAGNOSIS — D696 Thrombocytopenia, unspecified: Secondary | ICD-10-CM

## 2015-12-16 DIAGNOSIS — R252 Cramp and spasm: Secondary | ICD-10-CM

## 2015-12-16 LAB — CBC WITH DIFFERENTIAL/PLATELET
BASOS PCT: 1 %
Basophils Absolute: 0.1 10*3/uL (ref 0–0.1)
EOS PCT: 1 %
Eosinophils Absolute: 0.1 10*3/uL (ref 0–0.7)
HCT: 34.5 % — ABNORMAL LOW (ref 35.0–47.0)
HEMOGLOBIN: 11.7 g/dL — AB (ref 12.0–16.0)
Lymphocytes Relative: 38 %
Lymphs Abs: 2.2 10*3/uL (ref 1.0–3.6)
MCH: 28.7 pg (ref 26.0–34.0)
MCHC: 33.8 g/dL (ref 32.0–36.0)
MCV: 84.9 fL (ref 80.0–100.0)
MONO ABS: 0.6 10*3/uL (ref 0.2–0.9)
Monocytes Relative: 10 %
NEUTROS ABS: 2.7 10*3/uL (ref 1.4–6.5)
NEUTROS PCT: 50 %
Platelets: 184 10*3/uL (ref 150–440)
RBC: 4.07 MIL/uL (ref 3.80–5.20)
RDW: 17.9 % — AB (ref 11.5–14.5)
WBC: 5.7 10*3/uL (ref 3.6–11.0)

## 2015-12-16 LAB — COMPREHENSIVE METABOLIC PANEL
ALBUMIN: 3.2 g/dL — AB (ref 3.5–5.0)
ALK PHOS: 215 U/L — AB (ref 38–126)
ALT: 19 U/L (ref 14–54)
ANION GAP: 4 — AB (ref 5–15)
AST: 19 U/L (ref 15–41)
BILIRUBIN TOTAL: 0.4 mg/dL (ref 0.3–1.2)
BUN: 20 mg/dL (ref 6–20)
CALCIUM: 8.5 mg/dL — AB (ref 8.9–10.3)
CO2: 28 mmol/L (ref 22–32)
CREATININE: 1.02 mg/dL — AB (ref 0.44–1.00)
Chloride: 101 mmol/L (ref 101–111)
GFR calc Af Amer: >60 mL/min (ref >60)
GFR calc non Af Amer: 57 mL/min — ABNORMAL LOW (ref >60)
GLUCOSE: 249 mg/dL — AB (ref 65–99)
Potassium: 3.4 mmol/L — ABNORMAL LOW (ref 3.5–5.1)
SODIUM: 133 mmol/L — AB (ref 135–145)
TOTAL PROTEIN: 7.2 g/dL (ref 6.5–8.1)

## 2015-12-16 MED ORDER — PALONOSETRON HCL INJECTION 0.25 MG/5ML
0.2500 mg | Freq: Once | INTRAVENOUS | Status: AC
  Administered 2015-12-16: 0.25 mg via INTRAVENOUS
  Filled 2015-12-16: qty 5

## 2015-12-16 MED ORDER — HYDROCODONE-ACETAMINOPHEN 5-325 MG PO TABS: 1.0000 | ORAL_TABLET | Freq: Two times a day (BID) | ORAL | Status: DC | PRN

## 2015-12-16 MED ORDER — HEPARIN SOD (PORK) LOCK FLUSH 100 UNIT/ML IV SOLN
500.0000 [IU] | Freq: Once | INTRAVENOUS | Status: AC
  Administered 2015-12-16: 500 [IU] via INTRAVENOUS
  Filled 2015-12-16: qty 5

## 2015-12-16 MED ORDER — SODIUM CHLORIDE 0.9 % IV SOLN
10.0000 mg | Freq: Once | INTRAVENOUS | Status: AC
  Administered 2015-12-16: 10 mg via INTRAVENOUS
  Filled 2015-12-16: qty 1

## 2015-12-16 MED ORDER — SODIUM CHLORIDE 0.9% FLUSH
10.0000 mL | INTRAVENOUS | Status: DC | PRN
  Administered 2015-12-16: 10 mL via INTRAVENOUS
  Filled 2015-12-16: qty 10

## 2015-12-16 MED ORDER — DIPHENHYDRAMINE HCL 50 MG/ML IJ SOLN
50.0000 mg | Freq: Once | INTRAMUSCULAR | Status: AC
Start: 2015-12-16 — End: 2015-12-16
  Administered 2015-12-16: 50 mg via INTRAVENOUS
  Filled 2015-12-16: qty 1

## 2015-12-16 MED ORDER — PACLITAXEL CHEMO INJECTION 300 MG/50ML
80.0000 mg/m2 | Freq: Once | INTRAVENOUS | Status: AC
  Administered 2015-12-16: 168 mg via INTRAVENOUS
  Filled 2015-12-16: qty 28

## 2015-12-16 MED ORDER — SODIUM CHLORIDE 0.9 % IV SOLN
Freq: Once | INTRAVENOUS | Status: AC
  Administered 2015-12-16: 11:00:00 via INTRAVENOUS
  Filled 2015-12-16: qty 1000

## 2015-12-16 MED ORDER — FAMOTIDINE IN NACL 20-0.9 MG/50ML-% IV SOLN
20.0000 mg | Freq: Once | INTRAVENOUS | Status: AC
  Administered 2015-12-16: 20 mg via INTRAVENOUS
  Filled 2015-12-16: qty 50

## 2015-12-16 MED ORDER — SODIUM CHLORIDE 0.9 % IV SOLN
240.0000 mg | Freq: Once | INTRAVENOUS | Status: AC
  Administered 2015-12-16: 240 mg via INTRAVENOUS
  Filled 2015-12-16: qty 24

## 2015-12-16 NOTE — Addendum Note (Signed)
Addended by: Sofie Rower A on: 12/16/2015 02:01 PM   Modules accepted: Orders

## 2015-12-16 NOTE — Progress Notes (Signed)
Pt experiencing restless legs during pre-meds." It's the one that starts with  a "B" (benadryl). She states this happens every time. Offered to walk patient, she said that does not help. Patient went from being very pleasant to annoyed and uncomfortable. Remains reclined in chair constantly moving her legs.

## 2015-12-16 NOTE — Progress Notes (Signed)
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   # Smoker; ? Paranoia   INTERVAL HISTORY:  65year old female patient with above history of newly diagnosed breast cancer- triple negative; clinical stage II- currently on weekly carbotaxol is here for follow-up. Patient is post 8 treatments.  Patient had issues with patient's was getting chemotherapy infusion last week; patient did not finish all of her cycle #8 chemotherapy. [facility]   Patient feels her breast mass is getting smaller in size.   She continues to complain of pain- especially in the legs/muscles. She has been taking hydrocodone one pill twice a day. She feels the pain is not well controlled.   Complains of constipation; on dulcolax. Patient denies any tingling and numbness. Denies any nausea vomiting. No hair loss yet. Otherwise patient denies any unusual shortness of breath or chest pain or cough.   REVIEW OF SYSTEMS:  A complete 10 point review of system is done which is negative except mentioned above/history of present illness.   PAST MEDICAL HISTORY :  Past Medical History  Diagnosis Date  . Hypertension   . DVT (deep venous thrombosis) (HCC)     LEFT LEG   . [place] infarction) ([place]   . Diabetes mellitus without complication ([place]   . Seizure disorder ([facility]   . Coronary artery disease     a. NSTEMI cath 08/02/14: LM nl, pLAD 50%, mLCx 30%, mRCA 95% s/p PCI/DES, 2nd lesion 40%, EF 55%  . HLD (hyperlipidemia)   . Lung nodule   . Anxiety    . [place]izures ([place]   . Squamous cell lung cancer ([place] 04/23/2015    PAST SURGICAL HISTORY :   Past Surgical History  Procedure Laterality Date  . Abdominal hysterectomy      PARTIAL   . Leg surgery Right     BLOOD CLOT  . Cardiac catheterization  08/02/2014  . Coronary angioplasty  08/02/2014    drug eluting stent placement  . Ivc filter placement ([facility] hx)    . Portacath placement Left 10/13/2015    Procedure: INSERTION PORT-A-CATH;  Surgeon: CHubbard Robinson MD;  Location: [facility] ORS;  Service: General;  Laterality: Left;    FAMILY HISTORY :   Family History  Problem Relation Age of Onset  . Heart attack Mother   . Breast cancer Neg Hx     SOCIAL HISTORY:   Social History  Substance Use Topics  . Smoking status: Current Every Day Smoker -- 1.00 packs/day for 45 years    Types: Cigarettes  . Smokeless tobacco: Never Used  . Alcohol Use: No    ALLERGIES:  is allergic to penicillins.  MEDICATIONS:  Current Outpatient Prescriptions  Medication Sig Dispense Refill  . apixaban (ELIQUIS) 2.5 MG TABS tablet Take 2.5 mg by mouth 2 (two) times daily.    .Marland Kitchenatorvastatin (LIPITOR) 40 MG tablet Take 1 tablet by mouth 1 day or 1 dose. Reported on 12/16/2015    . empagliflozin (  JARDIANCE) 25 MG TABS tablet Take 25 mg by mouth daily.    Marland Kitchen HYDROcodone-acetaminophen (NORCO/VICODIN) 5-325 MG tablet Take 1 tablet by mouth every 12 (twelve) hours as needed. For pain 30 tablet 0  . lidocaine-prilocaine (EMLA) cream Apply 1 application topically as needed. Apply to port a cath site 1 hour before chemotherapy treatment. 30 g 6  . Linaclotide (LINZESS) 145 MCG CAPS capsule Take 1 capsule (145 mcg total) by mouth daily. 30 capsule 0  . lisinopril-hydrochlorothiazide (PRINZIDE,ZESTORETIC) 20-25 MG per tablet Take 1 tablet by mouth daily.     . metFORMIN (GLUCOPHAGE) 1000 MG tablet Take 1 tablet by mouth 2 (two) times daily. Reported on 10/08/2015    . naproxen (NAPROSYN) 500 MG tablet Take 1  tablet by mouth 2 (two) times daily. Reported on 11/03/2015    . ondansetron (ZOFRAN) 8 MG tablet Take 1 tablet (8 mg total) by mouth every 8 (eight) hours as needed for nausea or vomiting (start 3 days; after chemo). 40 tablet 0  . phenytoin (DILANTIN) 100 MG ER capsule Take 300 mg by mouth daily.     . pioglitazone (ACTOS) 45 MG tablet Take 1 tablet by mouth daily.    . prochlorperazine (COMPAZINE) 10 MG tablet Take 1 tablet (10 mg total) by mouth every 6 (six) hours as needed for nausea or vomiting. 40 tablet 0  . diazepam (VALIUM) 5 MG tablet Take 1 tablet by mouth 2 (two) times daily as needed. Reported on 12/16/2015    . nitroGLYCERIN (NITROSTAT) 0.4 MG SL tablet Place 0.4 mg under the tongue every 5 (five) minutes as needed for chest pain. Reported on 12/16/2015     No current facility-administered medications for this visit.   Facility-Administered Medications Ordered in Other Visits  Medication Dose Route Frequency Provider Last Rate Last Dose  . albuterol (PROVENTIL) (2.5 MG/3ML) 0.083% nebulizer solution 2.5 mg  2.5 mg Nebulization Once Nestor Lewandowsky, MD      . heparin lock flush 100 unit/mL  500 Units Intracatheter Once PRN Cammie Sickle, MD      . heparin lock flush 100 unit/mL  500 Units Intravenous Once Cammie Sickle, MD      . sodium chloride flush (NS) 0.9 % injection 10 mL  10 mL Intravenous PRN Cammie Sickle, MD   10 mL at 12/01/15 0850  . sodium chloride flush (NS) 0.9 % injection 10 mL  10 mL Intravenous PRN Cammie Sickle, MD   10 mL at 12/16/15 0915    PHYSICAL EXAMINATION: ECOG PERFORMANCE STATUS: 0 - Asymptomatic  BP 127/63 mmHg  Pulse 78  Temp(Src) 98.1 F (36.7 C)  Resp 20  Ht '5\' 5"'  (1.651 m)  Wt 201 lb 4.5 oz (91.3 kg)  BMI 33.49 kg/m2  Filed Weights   12/16/15 0941  Weight: 201 lb 4.5 oz (91.3 kg)    GENERAL: Well-nourished well-developed; Alert, no distress and comfortable. Alone;  EYES: no pallor or icterus OROPHARYNX: no  thrush or ulceration; good dentition  NECK: supple, no masses felt LYMPH: no palpable lymphadenopathy in the cervical, axillary or inguinal regions LUNGS: clear to auscultation and No wheeze or crackles HEART/CVS: regular rate & rhythm and no murmurs; No lower extremity edema ABDOMEN:abdomen soft, non-tender and normal bowel sounds Musculoskeletal:no cyanosis of digits and no clubbing  PSYCH: alert & oriented x 3 with fluent speech NEURO: no focal motor/sensory deficits SKIN: no rashes or significant lesions.  Right Breast- 4 x5 cm breast mass; mobile [smaller]; exam  in presence of RN.    LABORATORY DATA:  I have reviewed the data as listed    Component Value Date/Time   NA 133* 12/16/2015 0910   NA 141 08/03/2014 0050   K 3.4* 12/16/2015 0910   K 3.3* 08/03/2014 0050   CL 101 12/16/2015 0910   CL 108* 08/03/2014 0050   CO2 28 12/16/2015 0910   CO2 27 08/03/2014 0050   GLUCOSE 249* 12/16/2015 0910   GLUCOSE 164* 08/03/2014 0050   BUN 20 12/16/2015 0910   BUN 14 08/03/2014 0050   CREATININE 1.02* 12/16/2015 0910   CREATININE 0.68 08/03/2014 0050   CALCIUM 8.5* 12/16/2015 0910   CALCIUM 8.2* 08/03/2014 0050   PROT 7.2 12/16/2015 0910   PROT 6.7 04/11/2013 2241   ALBUMIN 3.2* 12/16/2015 0910   ALBUMIN 2.7* 04/11/2013 2241   AST 19 12/16/2015 0910   AST 13* 04/11/2013 2241   ALT 19 12/16/2015 0910   ALT 16 04/11/2013 2241   ALKPHOS 215* 12/16/2015 0910   ALKPHOS 207* 04/11/2013 2241   BILITOT 0.4 12/16/2015 0910   BILITOT 0.1* 04/11/2013 2241   GFRNONAA 57* 12/16/2015 0910   GFRNONAA >60 08/03/2014 0050   GFRNONAA >60 04/11/2013 2241   GFRAA >60 12/16/2015 0910   GFRAA >60 08/03/2014 0050   GFRAA >60 04/11/2013 2241    No results found for: SPEP, UPEP  Lab Results  Component Value Date   WBC 5.7 12/16/2015   NEUTROABS PENDING 12/16/2015   HGB 11.7* 12/16/2015   HCT 34.5* 12/16/2015   MCV 84.9 12/16/2015   PLT 184 12/16/2015      Chemistry       Component Value Date/Time   NA 133* 12/16/2015 0910   NA 141 08/03/2014 0050   K 3.4* 12/16/2015 0910   K 3.3* 08/03/2014 0050   CL 101 12/16/2015 0910   CL 108* 08/03/2014 0050   CO2 28 12/16/2015 0910   CO2 27 08/03/2014 0050   BUN 20 12/16/2015 0910   BUN 14 08/03/2014 0050   CREATININE 1.02* 12/16/2015 0910   CREATININE 0.68 08/03/2014 0050      Component Value Date/Time   CALCIUM 8.5* 12/16/2015 0910   CALCIUM 8.2* 08/03/2014 0050   ALKPHOS 215* 12/16/2015 0910   ALKPHOS 207* 04/11/2013 2241   AST 19 12/16/2015 0910   AST 13* 04/11/2013 2241   ALT 19 12/16/2015 0910   ALT 16 04/11/2013 2241   BILITOT 0.4 12/16/2015 0910   BILITOT 0.1* 04/11/2013 2241        ASSESSMENT & PLAN:   # Breast Ca- ER/PR- Neg; Her2-NEG. T3N0- STAGE II; patient has a large primary breast malignancy [7-8 cm in size]; triple negative.  Patient seems to be tolerating chemotherapy fairly well. Clinically the breast mass seems to be getting smaller. We will plan to do an follow-up ultrasound on the breast in appx 2 weeks/ prior to next visit.   # Proceed with  Carboplatin AUC 2- Taxol 80 mg/m weekly; proceed with Cycle #9 today.  CBC/CMP unremarkable except for elevated blood glucose of 215.  # constipation- likley narcotic induced recommend increased fluid intake/ Dulcolax and MiraLAX.  # Muscle cramps from Taxol. Continue hydrocodone one every 12 hours as needed. Discussed with the patient that I would not recommend going up on the frequency or the strength of pain prescription. New prescription.  #  I'll see the patient back in 2 weeks/ CBC CMP/ chemotherapy;  Next week-  CBC BMP/ chemotherapy. Plan to get  [place] right breast to assess the response to chemotherapy.     Cammie Sickle, MD 12/16/2015 10:02 AM

## 2015-12-16 NOTE — Patient Instructions (Addendum)
Constipation, Adult Constipation is when a person has fewer than three bowel movements a week, has difficulty having a bowel movement, or has stools that are dry, hard, or larger than normal. As people grow older, constipation is more common. A low-fiber diet, not taking in enough fluids, and taking certain medicines may make constipation worse.  CAUSES   Certain medicines, such as antidepressants, pain medicine, iron supplements, antacids, and water pills.   Certain diseases, such as diabetes, irritable bowel syndrome (IBS), thyroid disease, or depression.   Not drinking enough water.   Not eating enough fiber-rich foods.   Stress or travel.   Lack of physical activity or exercise.   Ignoring the urge to have a bowel movement.   Using laxatives too much.  SIGNS AND SYMPTOMS   Having fewer than three bowel movements a week.   Straining to have a bowel movement.   Having stools that are hard, dry, or larger than normal.   Feeling full or bloated.   Pain in the lower abdomen.   Not feeling relief after having a bowel movement.  TREATMENT  Treatment will depend on the severity of your constipation and what is causing it. Some dietary treatments include drinking more fluids and eating more fiber-rich foods. Lifestyle treatments may include regular exercise. If these diet and lifestyle recommendations do not help, your health care provider may recommend taking over-the-counter laxative medicines to help you have bowel movements. Prescription medicines may be prescribed if over-the-counter medicines do not work.  HOME CARE INSTRUCTIONS   Eat foods that have a lot of fiber, such as fruits, vegetables, whole grains, and beans.  Limit foods high in fat and processed sugars, such as french fries, hamburgers, cookies, candies, and soda.   A fiber supplement may be added to your diet if you cannot get enough fiber from foods.   Drink enough fluids to keep your urine  clear or pale yellow.   Exercise regularly or as directed by your health care provider.   Go to the restroom when you have the urge to go. Do not hold it.   Only take over-the-counter or prescription medicines as directed by your health care provider. Do not take other medicines for constipation without talking to your health care provider first.  [place] IF:   You have bright red blood in your stool.   Your constipation lasts for more than 4 days or gets worse.   You have abdominal or rectal pain.   You have thin, pencil-like stools.   You have unexplained weight loss. MAKE SURE YOU:   Understand these instructions.  Will watch your condition.  Will get help right away if you are not doing well or get worse.   This information is not intended to replace advice given to you by your health care provider. Make sure you discuss any questions you have with your health care provider.   Document Released: 05/07/2004 Document Revised: 08/30/2014 Document Reviewed: 05/21/2013 Elsevier Interactive Patient Education [employer].

## 2015-12-19 ENCOUNTER — Encounter: Payer: Self-pay | Admitting: Emergency Medicine

## 2015-12-19 ENCOUNTER — Other Ambulatory Visit: Payer: Self-pay

## 2015-12-19 DIAGNOSIS — I2510 Atherosclerotic heart disease of native coronary artery without angina pectoris: Secondary | ICD-10-CM | POA: Insufficient documentation

## 2015-12-19 DIAGNOSIS — Z791 Long term (current) use of non-steroidal anti-inflammatories (NSAID): Secondary | ICD-10-CM | POA: Insufficient documentation

## 2015-12-19 DIAGNOSIS — R0789 Other chest pain: Secondary | ICD-10-CM | POA: Diagnosis present

## 2015-12-19 DIAGNOSIS — Z853 Personal history of malignant neoplasm of breast: Secondary | ICD-10-CM | POA: Insufficient documentation

## 2015-12-19 DIAGNOSIS — Z7984 Long term (current) use of oral hypoglycemic drugs: Secondary | ICD-10-CM | POA: Insufficient documentation

## 2015-12-19 DIAGNOSIS — Z7982 Long term (current) use of aspirin: Secondary | ICD-10-CM | POA: Diagnosis not present

## 2015-12-19 DIAGNOSIS — R0602 Shortness of breath: Secondary | ICD-10-CM | POA: Diagnosis not present

## 2015-12-19 DIAGNOSIS — Z8669 Personal history of other diseases of the nervous system and sense organs: Secondary | ICD-10-CM | POA: Insufficient documentation

## 2015-12-19 DIAGNOSIS — I10 Essential (primary) hypertension: Secondary | ICD-10-CM | POA: Diagnosis not present

## 2015-12-19 DIAGNOSIS — Z85828 Personal history of other malignant neoplasm of skin: Secondary | ICD-10-CM | POA: Insufficient documentation

## 2015-12-19 DIAGNOSIS — Z79899 Other long term (current) drug therapy: Secondary | ICD-10-CM | POA: Insufficient documentation

## 2015-12-19 DIAGNOSIS — I252 Old myocardial infarction: Secondary | ICD-10-CM | POA: Diagnosis not present

## 2015-12-19 DIAGNOSIS — E785 Hyperlipidemia, unspecified: Secondary | ICD-10-CM | POA: Diagnosis not present

## 2015-12-19 DIAGNOSIS — F17210 Nicotine dependence, cigarettes, uncomplicated: Secondary | ICD-10-CM | POA: Diagnosis not present

## 2015-12-19 DIAGNOSIS — E119 Type 2 diabetes mellitus without complications: Secondary | ICD-10-CM | POA: Diagnosis not present

## 2015-12-19 DIAGNOSIS — Z86718 Personal history of other venous thrombosis and embolism: Secondary | ICD-10-CM | POA: Diagnosis not present

## 2015-12-19 NOTE — ED Notes (Signed)
Patient reports chest pain off/on for the past 4 hours.  +diaphoresis and short of breath.  Patient reports pain to left chest that radiates up into her upper chest.  Patient has a port-a-cath to left chest wall for treatment of breast cancer.

## 2015-12-20 ENCOUNTER — Emergency Department: Payer: Medicare Other

## 2015-12-20 ENCOUNTER — Emergency Department
Admission: EM | Admit: 2015-12-20 | Discharge: 2015-12-20 | Disposition: A | Discharge status: Home / Self Care | Payer: Medicare Other | Attending: Emergency Medicine | Admitting: Emergency Medicine

## 2015-12-20 DIAGNOSIS — R0789 Other chest pain: Secondary | ICD-10-CM | POA: Diagnosis not present

## 2015-12-20 DIAGNOSIS — R079 Chest pain, unspecified: Secondary | ICD-10-CM

## 2015-12-20 LAB — CBC
HCT: 34.8 % — ABNORMAL LOW (ref 35.0–47.0)
Hemoglobin: 12 g/dL (ref 12.0–16.0)
MCH: 29 pg (ref 26.0–34.0)
MCHC: 34.4 g/dL (ref 32.0–36.0)
MCV: 84.4 fL (ref 80.0–100.0)
PLATELETS: 148 10*3/uL — AB (ref 150–440)
RBC: 4.12 MIL/uL (ref 3.80–5.20)
RDW: 19.6 % — AB (ref 11.5–14.5)
WBC: 3.4 10*3/uL — AB (ref 3.6–11.0)

## 2015-12-20 LAB — BASIC METABOLIC PANEL
Anion gap: 9 (ref 5–15)
BUN: 21 mg/dL — AB (ref 6–20)
CALCIUM: 8.6 mg/dL — AB (ref 8.9–10.3)
CHLORIDE: 99 mmol/L — AB (ref 101–111)
CO2: 26 mmol/L (ref 22–32)
CREATININE: 0.87 mg/dL (ref 0.44–1.00)
GFR calc non Af Amer: >60 mL/min (ref >60)
Glucose, Bld: 230 mg/dL — ABNORMAL HIGH (ref 65–99)
Potassium: 3.6 mmol/L (ref 3.5–5.1)
SODIUM: 134 mmol/L — AB (ref 135–145)

## 2015-12-20 LAB — TROPONIN I

## 2015-12-20 NOTE — ED Notes (Signed)
Pt leaving AMA, pt confirms that she is refusing further testing or treatment, pt states she understand the risk of leaving without testing or treatment.  Conditions for returning to the ED reviewed including SOB, n/v, worsening CP, diaphoresis.  PT verbalized understanding.  Pt told to followup with PCP ASAP, pt verbalized understanding.

## 2015-12-20 NOTE — ED Notes (Signed)
Per ED tech from triage, pt is refusing to move from wheelchair to bed or to be placed on cardiac monitor.  Per tech, pt was wanting to leave but agreed to be seen by a nurse and MD.

## 2015-12-20 NOTE — ED Notes (Signed)
Pt. States chest pain that started at around 4 pm.  Pt. States she became weak, SOB and diaphoretic.  Pt. States sternal chest pain that does not radiate.  Pt. Denies chest pain at this time.  Pt. Does not want to get into bed due to comfort.

## 2015-12-20 NOTE — ED Provider Notes (Signed)
[facility] Emergency Department Provider Note  ____________________________________________   I have reviewed the triage vital signs and the nursing notes.   HISTORY  Chief Complaint I'm going home   HPI Dawn Allen is a 65 y.o. female wound extensively, complicated medical history that includes ACS with stents she states pain last December, as well as DVTs, with a IVC filter on Eliquis which she states she is compliant with, as well as ongoing treatment for breast cancer with a port. Patient states that around 4:00 she began to have gradual onset right-sided nonpleuritic chest discomfort. Patient has chronic pain in that area from her breast lump however his pain seemed to hurt to be different. She states she then also had shortness of breath and diaphoresis. Symptoms lasted until she arrived here this evening. She states now she has no symptoms. She denies any radiation of the chest pain was a sharp pain, patient states that it was worse when she changes position the wrong way. She is adamant that she is going to go home and she adamantly refuses any further care from [place] but she did want to talk to her doctor. Her family is in the room, and they advised her to stay as to why but she is adamant that she would like to leave she does consent to stay long enough for me to check her results      Past Medical History  Diagnosis Date  . Hypertension   . DVT (deep venous thrombosis) (HCC)     LEFT LEG   . MI (myocardial infarction) ([place])   . Diabetes mellitus without complication ([place])   . Seizure disorder ([place])   . Coronary artery disease     a. NSTEMI cath 08/02/14: LM nl, pLAD 50%, mLCx 30%, mRCA 95% s/p PCI/DES, 2nd lesion 40%, EF 55%  . HLD (hyperlipidemia)   . Lung nodule   . Anxiety   . Seizures ([place])   . Squamous cell lung cancer ([place]) 04/23/2015  . Cancer of right female breast [facility])     Patient Active Problem List   Diagnosis Date Noted  .  Breast cancer ([place]) 10/06/2015  . Mass of right breast 08/14/2015  . Thyroid nodule 04/29/2015  . Squamous cell lung cancer ([place]) 04/23/2015  . Hypokalemia 03/02/2015  . Leukocytosis 03/02/2015  . Generalized weakness 03/02/2015  . Lung nodule 03/02/2015  . Pain in a tooth or teeth 03/02/2015  . Sepsis ([place]) 03/02/2015  . Lung mass 03/02/2015  . SIRS (systemic inflammatory response syndrome) ([place]) 02/27/2015  . Coronary artery disease   . Hypertension   . DVT (deep venous thrombosis) ([place])   . HLD (hyperlipidemia)   . Achilles bursitis or tendinitis 05/31/2013  . 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 Other hammer toe (acquired) 05/31/2013  . 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 Abdominal hysterectomy      PARTIAL   . Leg surgery Right     BLOOD CLOT  . Cardiac catheterization  08/02/2014  . Coronary angioplasty  08/02/2014    drug eluting stent placement  . Ivc filter placement ([facility] hx)    . Portacath placement Left 10/13/2015    Procedure: INSERTION PORT-A-CATH;  Surgeon: Hubbard Robinson, MD;  Location: [facility] ORS;  Service: General;  Laterality: Left;    Current Outpatient Rx  Name  Route  Sig  Dispense  Refill  . apixaban (ELIQUIS) 2.5 MG TABS tablet  Oral   Take 2.5 mg by mouth 2 (two) times daily.         . Aspirin-Salicylamide-Caffeine (BC HEADACHE POWDER PO)   Oral   Take 1 packet by mouth daily.         Marland Kitchen atorvastatin (LIPITOR) 40 MG tablet   Oral   Take 1 tablet by mouth 1 day or 1 dose. Reported on 12/16/2015         . diazepam (VALIUM) 5 MG tablet   Oral   Take 1 tablet by mouth 2 (two) times daily as needed. Reported on 12/16/2015         . empagliflozin (JARDIANCE) 25 MG TABS tablet   Oral   Take 25 mg by mouth daily.         Marland Kitchen HYDROcodone-acetaminophen (NORCO/VICODIN) 5-325 MG tablet   Oral   Take 1 tablet by mouth every 12 (twelve) hours as needed.  For pain   30 tablet   0   . lidocaine-prilocaine (EMLA) cream   Topical   Apply 1 application topically as needed. Apply to port a cath site 1 hour before chemotherapy treatment.   30 g   6   . Linaclotide (LINZESS) 145 MCG CAPS capsule   Oral   Take 1 capsule (145 mcg total) by mouth daily.   30 capsule   0   . lisinopril-hydrochlorothiazide (PRINZIDE,ZESTORETIC) 20-25 MG per tablet   Oral   Take 1 tablet by mouth daily.          . metFORMIN (GLUCOPHAGE) 1000 MG tablet   Oral   Take 1 tablet by mouth 2 (two) times daily. Reported on 10/08/2015         . naproxen (NAPROSYN) 500 MG tablet   Oral   Take 1 tablet by mouth 2 (two) times daily. Reported on 11/03/2015         . nitroGLYCERIN (NITROSTAT) 0.4 MG SL tablet   Sublingual   Place 0.4 mg under the tongue every 5 (five) minutes as needed for chest pain. Reported on 12/16/2015         . ondansetron (ZOFRAN) 8 MG tablet   Oral   Take 1 tablet (8 mg total) by mouth every 8 (eight) hours as needed for nausea or vomiting (start 3 days; after chemo).   40 tablet   0   . phenytoin (DILANTIN) 100 MG ER capsule   Oral   Take 300 mg by mouth daily.          . pioglitazone (ACTOS) 45 MG tablet   Oral   Take 1 tablet by mouth daily.         . prochlorperazine (COMPAZINE) 10 MG tablet   Oral   Take 1 tablet (10 mg total) by mouth every 6 (six) hours as needed for nausea or vomiting.   40 tablet   0     Allergies Penicillins  Family History  Problem Relation Age of Onset  . Heart attack Mother   . Breast cancer Neg Hx     Social History Social History  Substance Use Topics  . Smoking status: Current Every Day Smoker -- 0.50 packs/day for 45 years    Types: Cigarettes  . Smokeless tobacco: Never Used  . Alcohol Use: No    Review of Systems Constitutional: No fever/chills Eyes: No visual changes. ENT: No sore throat. No stiff neck no neck pain Cardiovascular: Positive chest pain. Respiratory:  Positive shortness of breath. Gastrointestinal:   no vomiting.  No diarrhea.  No constipation. Genitourinary: Negative for dysuria. Musculoskeletal: Negative lower extremity swelling Skin: Negative for rash. Neurological: Negative for headaches, focal weakness or numbness. 10-point ROS otherwise negative.  ____________________________________________   PHYSICAL EXAM:  VITAL SIGNS: ED Triage Vitals  Enc Vitals Group     BP 12/19/15 2355 120/67 mmHg     Pulse Rate 12/19/15 2355 81     Resp 12/19/15 2355 18     Temp 12/19/15 2355 97.6 F (36.4 C)     Temp Source 12/19/15 2355 Oral     SpO2 12/19/15 2355 96 %     Weight 12/19/15 2355 200 lb (90.719 kg)     Height 12/19/15 2355 '5\' 5"'$  (1.651 m)     Head Cir --      Peak Flow --      Pain Score 12/19/15 2352 7     Pain Loc --      Pain Edu? --      Excl. in [place]? --     Constitutional: Alert and oriented. Well appearing and in no acute distress. Eyes: Conjunctivae are normal. PERRL. EOMI. Head: Atraumatic. Nose: No congestion/rhinnorhea. Mouth/Throat: Mucous membranes are moist.  Oropharynx non-erythematous. Neck: No stridor.   Nontender with no meningismus Cardiovascular: Normal rate, regular rhythm. Grossly normal heart sounds.  Good peripheral circulation. Chest: Tender to palpation in the right chest wall, no evidence of acute injury, flail chest, or crepitus Respiratory: Normal respiratory effort.  No retractions. Lungs CTAB. Abdominal: Soft and nontender. No distention. No guarding no rebound Back:  There is no focal tenderness or step off there is no midline tenderness there are no lesions noted. there is no CVA tenderness Musculoskeletal: No lower extremity tenderness. No joint effusions, no DVT signs strong distal pulses symmetric bilateral mild lower extremity edema Neurologic:  Normal speech and language. No gross focal neurologic deficits are appreciated.  Skin:  Skin is warm, dry and intact. No rash  noted. Psychiatric: Mood and affect are normal. Speech and behavior are normal.  ____________________________________________   LABS (all labs ordered are listed, but only abnormal results are displayed)  Labs Reviewed  BASIC METABOLIC PANEL - Abnormal; Notable for the following:    Sodium 134 (*)    Chloride 99 (*)    Glucose, Bld 230 (*)    BUN 21 (*)    Calcium 8.6 (*)    All other components within normal limits  CBC - Abnormal; Notable for the following:    WBC 3.4 (*)    HCT 34.8 (*)    RDW 19.6 (*)    Platelets 148 (*)    All other components within normal limits  TROPONIN I   ____________________________________________  EKG  I personally interpreted any EKGs ordered by me or triage Sinus rhythm rate 80 bpm no acute ST elevation nonspecific ST changes noted, likely old inferior and anterior infarcts are noted. ____________________________________________  [id number]  I reviewed any imaging ordered by me or triage that were performed during my shift and, if possible, patient and/or family made aware of any abnormal findings. ____________________________________________   PROCEDURES  Procedure(s) performed: None  Critical Care performed: None  ____________________________________________   INITIAL IMPRESSION / ASSESSMENT AND PLAN / ED COURSE  Pertinent labs & imaging results that were available during my care of the patient were reviewed by me and considered in my medical decision making (see chart for details).  I have sat down and had a heart-to-heart with this patient extensively, I have advised  a CT scan to rule out PE and she refuses, but advised serial cardiac markers and she refuses. I've advised that she be admitted to the hospital and she refuses. Patient understands that she could die if she goes home. That anyone of the disease processes that I am unable to thus far rule out the emergency room including PE or ACS could kill her. She understands that  even though thus far my tests are reassuring, they do not mean that she does not have a serious mental or illness at this time nor do they me it is safe for her to go home. Her sister is in the room with her, and she is also trying to persuade the patient to stay with the patient refuses. The sister says "she is hard headed.". I have offered the patient a workup in the emergency room which is more sensitive than we have already performed and she refuses to stay even for that. She is requesting discharge paperwork. I certainly wouldn't give them to her but have also explained to her that I would like her to sign out [place], patient states "I have signed the paper before and I'm happy to do that. Patient understands she is welcome to return to the emergency room at any time she wishes further evaluation of these symptoms and that if she does have chest pain or shortness of breath she should call 911 and return immediately. She voices understanding and will follow-up but refuses to stay her follow my advice any further. I believe the patient is competent and understands what she is doing in her decision-making process and so does the family member who is with her. ____________________________________________   FINAL CLINICAL IMPRESSION(S) / ED DIAGNOSES  Final diagnoses:  None      This chart was dictated using voice recognition software.  Despite best efforts to proofread,  errors can occur which can change meaning.     Schuyler Amor, MD 12/20/15 [phone]

## 2015-12-20 NOTE — Discharge Instructions (Signed)
We have offered you admission and further evaluation in the emergency room and he refused. This is your choice however does mean that we cannot rule out life-threatening disease process as you and I have talked about. This means, if he go home, there is a chance you might die of a heart attack or blood clot or other as yet undefined illness. You understand this but would prefer to go home. This is certainly your choice and that you are undergone ligation to follow our advice if you do not see fit however, is important that you understand exactly why we advised that you stay. If you have increased chest pain, persistent chest pain, or change in your chest pain, recurrent chest pain, shortness of breath, you feel sweaty lightheaded or unwell in any way or you change your mind in general about being further evaluated in the emergency department please return immediately feel free to use the 911 service if you feel ill. Follow closely with your doctor.

## 2015-12-23 ENCOUNTER — Ambulatory Visit: Payer: Medicare Other

## 2015-12-23 ENCOUNTER — Other Ambulatory Visit: Payer: Self-pay | Admitting: *Deleted

## 2015-12-23 ENCOUNTER — Other Ambulatory Visit: Payer: Medicare Other

## 2015-12-23 ENCOUNTER — Other Ambulatory Visit: Payer: Self-pay | Admitting: Internal Medicine

## 2015-12-23 ENCOUNTER — Telehealth: Payer: Self-pay | Admitting: Internal Medicine

## 2015-12-23 ENCOUNTER — Inpatient Hospital Stay: Payer: Medicare Other | Attending: Internal Medicine

## 2015-12-23 VITALS — BP 153/84 | HR 77 | Temp 97.5°F | Resp 20

## 2015-12-23 DIAGNOSIS — I2510 Atherosclerotic heart disease of native coronary artery without angina pectoris: Secondary | ICD-10-CM | POA: Insufficient documentation

## 2015-12-23 DIAGNOSIS — I10 Essential (primary) hypertension: Secondary | ICD-10-CM | POA: Insufficient documentation

## 2015-12-23 DIAGNOSIS — Z86718 Personal history of other venous thrombosis and embolism: Secondary | ICD-10-CM | POA: Diagnosis not present

## 2015-12-23 DIAGNOSIS — T451X5S Adverse effect of antineoplastic and immunosuppressive drugs, sequela: Secondary | ICD-10-CM | POA: Diagnosis not present

## 2015-12-23 DIAGNOSIS — I252 Old myocardial infarction: Secondary | ICD-10-CM | POA: Diagnosis not present

## 2015-12-23 DIAGNOSIS — C50411 Malignant neoplasm of upper-outer quadrant of right female breast: Secondary | ICD-10-CM | POA: Insufficient documentation

## 2015-12-23 DIAGNOSIS — Z7901 Long term (current) use of anticoagulants: Secondary | ICD-10-CM | POA: Insufficient documentation

## 2015-12-23 DIAGNOSIS — Z7984 Long term (current) use of oral hypoglycemic drugs: Secondary | ICD-10-CM | POA: Insufficient documentation

## 2015-12-23 DIAGNOSIS — K5900 Constipation, unspecified: Secondary | ICD-10-CM | POA: Diagnosis not present

## 2015-12-23 DIAGNOSIS — Z85850 Personal history of malignant neoplasm of thyroid: Secondary | ICD-10-CM | POA: Insufficient documentation

## 2015-12-23 DIAGNOSIS — Z85118 Personal history of other malignant neoplasm of bronchus and lung: Secondary | ICD-10-CM | POA: Diagnosis not present

## 2015-12-23 DIAGNOSIS — Z7982 Long term (current) use of aspirin: Secondary | ICD-10-CM | POA: Insufficient documentation

## 2015-12-23 DIAGNOSIS — E1165 Type 2 diabetes mellitus with hyperglycemia: Secondary | ICD-10-CM | POA: Insufficient documentation

## 2015-12-23 DIAGNOSIS — E876 Hypokalemia: Secondary | ICD-10-CM | POA: Insufficient documentation

## 2015-12-23 DIAGNOSIS — Z5111 Encounter for antineoplastic chemotherapy: Secondary | ICD-10-CM | POA: Insufficient documentation

## 2015-12-23 DIAGNOSIS — R252 Cramp and spasm: Secondary | ICD-10-CM | POA: Insufficient documentation

## 2015-12-23 DIAGNOSIS — G40909 Epilepsy, unspecified, not intractable, without status epilepticus: Secondary | ICD-10-CM | POA: Diagnosis not present

## 2015-12-23 DIAGNOSIS — E785 Hyperlipidemia, unspecified: Secondary | ICD-10-CM | POA: Diagnosis not present

## 2015-12-23 DIAGNOSIS — C50911 Malignant neoplasm of unspecified site of right female breast: Secondary | ICD-10-CM

## 2015-12-23 DIAGNOSIS — D6959 Other secondary thrombocytopenia: Secondary | ICD-10-CM | POA: Insufficient documentation

## 2015-12-23 DIAGNOSIS — C3492 Malignant neoplasm of unspecified part of left bronchus or lung: Secondary | ICD-10-CM

## 2015-12-23 DIAGNOSIS — Z79899 Other long term (current) drug therapy: Secondary | ICD-10-CM | POA: Insufficient documentation

## 2015-12-23 DIAGNOSIS — Z923 Personal history of irradiation: Secondary | ICD-10-CM | POA: Insufficient documentation

## 2015-12-23 DIAGNOSIS — Z171 Estrogen receptor negative status [ER-]: Secondary | ICD-10-CM | POA: Diagnosis not present

## 2015-12-23 DIAGNOSIS — C50819 Malignant neoplasm of overlapping sites of unspecified female breast: Secondary | ICD-10-CM

## 2015-12-23 LAB — CBC WITH DIFFERENTIAL/PLATELET
BASOS ABS: 0.1 10*3/uL (ref 0–0.1)
Basophils Relative: 1 %
Eosinophils Absolute: 0.1 10*3/uL (ref 0–0.7)
Eosinophils Relative: 2 %
HEMATOCRIT: 35.4 % (ref 35.0–47.0)
Hemoglobin: 12 g/dL (ref 12.0–16.0)
LYMPHS PCT: 31 %
Lymphs Abs: 2 10*3/uL (ref 1.0–3.6)
MCH: 28.9 pg (ref 26.0–34.0)
MCHC: 33.8 g/dL (ref 32.0–36.0)
MCV: 85.5 fL (ref 80.0–100.0)
MONO ABS: 0.6 10*3/uL (ref 0.2–0.9)
Monocytes Relative: 9 %
NEUTROS ABS: 3.7 10*3/uL (ref 1.4–6.5)
Neutrophils Relative %: 57 %
PLATELETS: 138 10*3/uL — AB (ref 150–440)
RBC: 4.14 MIL/uL (ref 3.80–5.20)
RDW: 19.4 % — AB (ref 11.5–14.5)
WBC: 6.5 10*3/uL (ref 3.6–11.0)

## 2015-12-23 LAB — BASIC METABOLIC PANEL
ANION GAP: 8 (ref 5–15)
BUN: 25 mg/dL — ABNORMAL HIGH (ref 6–20)
CALCIUM: 8.7 mg/dL — AB (ref 8.9–10.3)
CO2: 25 mmol/L (ref 22–32)
Chloride: 101 mmol/L (ref 101–111)
Creatinine, Ser: 0.77 mg/dL (ref 0.44–1.00)
Glucose, Bld: 210 mg/dL — ABNORMAL HIGH (ref 65–99)
POTASSIUM: 3.5 mmol/L (ref 3.5–5.1)
Sodium: 134 mmol/L — ABNORMAL LOW (ref 135–145)

## 2015-12-23 MED ORDER — SODIUM CHLORIDE 0.9 % IV SOLN
240.0000 mg | Freq: Once | INTRAVENOUS | Status: AC
  Administered 2015-12-23: 240 mg via INTRAVENOUS
  Filled 2015-12-23: qty 24

## 2015-12-23 MED ORDER — PALONOSETRON HCL INJECTION 0.25 MG/5ML
0.2500 mg | Freq: Once | INTRAVENOUS | Status: AC
  Administered 2015-12-23: 0.25 mg via INTRAVENOUS
  Filled 2015-12-23: qty 5

## 2015-12-23 MED ORDER — SODIUM CHLORIDE 0.9% FLUSH
10.0000 mL | INTRAVENOUS | Status: DC | PRN
  Administered 2015-12-23: 10 mL via INTRAVENOUS
  Filled 2015-12-23: qty 10

## 2015-12-23 MED ORDER — HEPARIN SOD (PORK) LOCK FLUSH 100 UNIT/ML IV SOLN
INTRAVENOUS | Status: AC
  Filled 2015-12-23: qty 5

## 2015-12-23 MED ORDER — FAMOTIDINE IN NACL 20-0.9 MG/50ML-% IV SOLN
20.0000 mg | Freq: Once | INTRAVENOUS | Status: AC
  Administered 2015-12-23: 20 mg via INTRAVENOUS
  Filled 2015-12-23: qty 50

## 2015-12-23 MED ORDER — ACETAMINOPHEN 325 MG PO TABS
650.0000 mg | ORAL_TABLET | Freq: Once | ORAL | Status: AC
  Administered 2015-12-23: 650 mg via ORAL

## 2015-12-23 MED ORDER — SODIUM CHLORIDE 0.9 % IV SOLN
Freq: Once | INTRAVENOUS | Status: AC
  Administered 2015-12-23: 10:00:00 via INTRAVENOUS
  Filled 2015-12-23: qty 1000

## 2015-12-23 MED ORDER — DIPHENHYDRAMINE HCL 50 MG/ML IJ SOLN
25.0000 mg | Freq: Once | INTRAMUSCULAR | Status: AC
  Administered 2015-12-23: 25 mg via INTRAVENOUS
  Filled 2015-12-23: qty 1

## 2015-12-23 MED ORDER — HEPARIN SOD (PORK) LOCK FLUSH 100 UNIT/ML IV SOLN
500.0000 [IU] | Freq: Once | INTRAVENOUS | Status: AC
  Administered 2015-12-23: 500 [IU] via INTRAVENOUS
  Filled 2015-12-23: qty 5

## 2015-12-23 MED ORDER — SODIUM CHLORIDE 0.9 % IV SOLN
10.0000 mg | Freq: Once | INTRAVENOUS | Status: AC
  Administered 2015-12-23: 10 mg via INTRAVENOUS
  Filled 2015-12-23: qty 1

## 2015-12-23 MED ORDER — PACLITAXEL CHEMO INJECTION 300 MG/50ML
80.0000 mg/m2 | Freq: Once | INTRAVENOUS | Status: AC
  Administered 2015-12-23: 168 mg via INTRAVENOUS
  Filled 2015-12-23: qty 28

## 2015-12-23 NOTE — Telephone Encounter (Signed)
erroneous

## 2015-12-26 ENCOUNTER — Ambulatory Visit
Admission: RE | Admit: 2015-12-26 | Discharge: 2015-12-26 | Disposition: A | Discharge status: Home / Self Care | Payer: Medicare Other | Source: Ambulatory Visit | Attending: Internal Medicine | Admitting: Internal Medicine

## 2015-12-26 ENCOUNTER — Other Ambulatory Visit: Payer: Self-pay | Admitting: Internal Medicine

## 2015-12-26 DIAGNOSIS — C50911 Malignant neoplasm of unspecified site of right female breast: Secondary | ICD-10-CM

## 2015-12-26 HISTORY — DX: Malignant neoplasm of unspecified site of unspecified female breast: C50.919

## 2015-12-30 ENCOUNTER — Inpatient Hospital Stay: Payer: Medicare Other

## 2015-12-30 ENCOUNTER — Ambulatory Visit: Payer: Medicare Other

## 2015-12-30 ENCOUNTER — Other Ambulatory Visit: Payer: Medicare Other

## 2015-12-30 ENCOUNTER — Ambulatory Visit: Payer: Medicare Other | Admitting: Internal Medicine

## 2016-01-01 ENCOUNTER — Inpatient Hospital Stay: Payer: Medicare Other

## 2016-01-01 ENCOUNTER — Inpatient Hospital Stay (HOSPITAL_BASED_OUTPATIENT_CLINIC_OR_DEPARTMENT_OTHER): Payer: Medicare Other | Admitting: Internal Medicine

## 2016-01-01 VITALS — BP 134/79 | HR 83 | Temp 97.3°F | Resp 16 | Wt 197.3 lb

## 2016-01-01 DIAGNOSIS — I10 Essential (primary) hypertension: Secondary | ICD-10-CM

## 2016-01-01 DIAGNOSIS — Z85118 Personal history of other malignant neoplasm of bronchus and lung: Secondary | ICD-10-CM | POA: Diagnosis not present

## 2016-01-01 DIAGNOSIS — Z923 Personal history of irradiation: Secondary | ICD-10-CM

## 2016-01-01 DIAGNOSIS — Z79899 Other long term (current) drug therapy: Secondary | ICD-10-CM

## 2016-01-01 DIAGNOSIS — Z86718 Personal history of other venous thrombosis and embolism: Secondary | ICD-10-CM

## 2016-01-01 DIAGNOSIS — I252 Old myocardial infarction: Secondary | ICD-10-CM

## 2016-01-01 DIAGNOSIS — Z7982 Long term (current) use of aspirin: Secondary | ICD-10-CM

## 2016-01-01 DIAGNOSIS — I2510 Atherosclerotic heart disease of native coronary artery without angina pectoris: Secondary | ICD-10-CM

## 2016-01-01 DIAGNOSIS — C50411 Malignant neoplasm of upper-outer quadrant of right female breast: Secondary | ICD-10-CM | POA: Diagnosis not present

## 2016-01-01 DIAGNOSIS — Z7901 Long term (current) use of anticoagulants: Secondary | ICD-10-CM

## 2016-01-01 DIAGNOSIS — E876 Hypokalemia: Secondary | ICD-10-CM

## 2016-01-01 DIAGNOSIS — Z171 Estrogen receptor negative status [ER-]: Secondary | ICD-10-CM | POA: Diagnosis not present

## 2016-01-01 DIAGNOSIS — Z5111 Encounter for antineoplastic chemotherapy: Secondary | ICD-10-CM | POA: Diagnosis not present

## 2016-01-01 DIAGNOSIS — C50911 Malignant neoplasm of unspecified site of right female breast: Secondary | ICD-10-CM

## 2016-01-01 DIAGNOSIS — R252 Cramp and spasm: Secondary | ICD-10-CM

## 2016-01-01 DIAGNOSIS — E1165 Type 2 diabetes mellitus with hyperglycemia: Secondary | ICD-10-CM

## 2016-01-01 DIAGNOSIS — C50819 Malignant neoplasm of overlapping sites of unspecified female breast: Secondary | ICD-10-CM

## 2016-01-01 DIAGNOSIS — Z85850 Personal history of malignant neoplasm of thyroid: Secondary | ICD-10-CM

## 2016-01-01 LAB — COMPREHENSIVE METABOLIC PANEL
ALK PHOS: 214 U/L — AB (ref 38–126)
ALT: 18 U/L (ref 14–54)
ANION GAP: 9 (ref 5–15)
AST: 19 U/L (ref 15–41)
Albumin: 3.7 g/dL (ref 3.5–5.0)
BUN: 25 mg/dL — ABNORMAL HIGH (ref 6–20)
CALCIUM: 8.9 mg/dL (ref 8.9–10.3)
CO2: 26 mmol/L (ref 22–32)
Chloride: 101 mmol/L (ref 101–111)
Creatinine, Ser: 0.75 mg/dL (ref 0.44–1.00)
GFR calc non Af Amer: >60 mL/min (ref >60)
Glucose, Bld: 173 mg/dL — ABNORMAL HIGH (ref 65–99)
Potassium: 3.2 mmol/L — ABNORMAL LOW (ref 3.5–5.1)
SODIUM: 136 mmol/L (ref 135–145)
TOTAL PROTEIN: 7.4 g/dL (ref 6.5–8.1)
Total Bilirubin: 0.3 mg/dL (ref 0.3–1.2)

## 2016-01-01 LAB — CBC WITH DIFFERENTIAL/PLATELET
Basophils Absolute: 0.1 10*3/uL (ref 0–0.1)
Basophils Relative: 1 %
EOS ABS: 0.1 10*3/uL (ref 0–0.7)
Eosinophils Relative: 2 %
HEMATOCRIT: 34.8 % — AB (ref 35.0–47.0)
HEMOGLOBIN: 11.9 g/dL — AB (ref 12.0–16.0)
LYMPHS ABS: 2.2 10*3/uL (ref 1.0–3.6)
LYMPHS PCT: 31 %
MCH: 29.2 pg (ref 26.0–34.0)
MCHC: 34.3 g/dL (ref 32.0–36.0)
MCV: 85.2 fL (ref 80.0–100.0)
MONOS PCT: 13 %
Monocytes Absolute: 0.9 10*3/uL (ref 0.2–0.9)
NEUTROS PCT: 53 %
Neutro Abs: 3.8 10*3/uL (ref 1.4–6.5)
Platelets: 142 10*3/uL — ABNORMAL LOW (ref 150–440)
RBC: 4.09 MIL/uL (ref 3.80–5.20)
RDW: 22.6 % — ABNORMAL HIGH (ref 11.5–14.5)
WBC: 7.1 10*3/uL (ref 3.6–11.0)

## 2016-01-01 MED ORDER — FAMOTIDINE IN NACL 20-0.9 MG/50ML-% IV SOLN
20.0000 mg | Freq: Once | INTRAVENOUS | Status: AC
  Administered 2016-01-01: 20 mg via INTRAVENOUS
  Filled 2016-01-01: qty 50

## 2016-01-01 MED ORDER — SODIUM CHLORIDE 0.9 % IV SOLN
Freq: Once | INTRAVENOUS | Status: AC
  Administered 2016-01-01: 10:00:00 via INTRAVENOUS
  Filled 2016-01-01: qty 1000

## 2016-01-01 MED ORDER — DIPHENHYDRAMINE HCL 50 MG/ML IJ SOLN
25.0000 mg | Freq: Once | INTRAMUSCULAR | Status: AC
  Administered 2016-01-01: 25 mg via INTRAVENOUS
  Filled 2016-01-01: qty 1

## 2016-01-01 MED ORDER — SODIUM CHLORIDE 0.9% FLUSH
10.0000 mL | INTRAVENOUS | Status: DC | PRN
  Administered 2016-01-01: 10 mL via INTRAVENOUS
  Filled 2016-01-01: qty 10

## 2016-01-01 MED ORDER — SODIUM CHLORIDE 0.9 % IV SOLN
240.0000 mg | Freq: Once | INTRAVENOUS | Status: AC
  Administered 2016-01-01: 240 mg via INTRAVENOUS
  Filled 2016-01-01: qty 24

## 2016-01-01 MED ORDER — HYDROCODONE-ACETAMINOPHEN 5-325 MG PO TABS: 1.0000 | ORAL_TABLET | Freq: Two times a day (BID) | ORAL | Status: DC | PRN

## 2016-01-01 MED ORDER — DEXTROSE 5 % IV SOLN
80.0000 mg/m2 | Freq: Once | INTRAVENOUS | Status: AC
  Administered 2016-01-01: 168 mg via INTRAVENOUS
  Filled 2016-01-01: qty 28

## 2016-01-01 MED ORDER — HEPARIN SOD (PORK) LOCK FLUSH 100 UNIT/ML IV SOLN: 500.0000 [IU] | Freq: Once | INTRAVENOUS | Status: DC | PRN

## 2016-01-01 MED ORDER — HEPARIN SOD (PORK) LOCK FLUSH 100 UNIT/ML IV SOLN
500.0000 [IU] | Freq: Once | INTRAVENOUS | Status: AC
  Administered 2016-01-01: 500 [IU] via INTRAVENOUS
  Filled 2016-01-01: qty 5

## 2016-01-01 MED ORDER — SODIUM CHLORIDE 0.9 % IV SOLN
10.0000 mg | Freq: Once | INTRAVENOUS | Status: AC
  Administered 2016-01-01: 10 mg via INTRAVENOUS
  Filled 2016-01-01: qty 1

## 2016-01-01 MED ORDER — PALONOSETRON HCL INJECTION 0.25 MG/5ML
0.2500 mg | Freq: Once | INTRAVENOUS | Status: AC
  Administered 2016-01-01: 0.25 mg via INTRAVENOUS
  Filled 2016-01-01: qty 5

## 2016-01-01 NOTE — Progress Notes (Signed)
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   # Smoker; ? Paranoia   INTERVAL HISTORY: Poor- vague historian  65year old female patient with above history of newly diagnosed breast cancer- triple negative; clinical stage II- currently on neo-adjuvant weekly carbotaxol is here for follow-up. Patient is post 10 treatments/ review the ultrasound/mammogram.  Patient feels her breast mass is getting smaller in size.   Patient continues to complain of pain in her leg muscles. Complains of fatigue. Complains of poor appetite.  She has been taking hydrocodone one pill twice a day. She feels the pain is not well controlled. Denies any significant tingling and numbness.  Complains of constipation; on dulcolax. Otherwise patient denies any unusual shortness of breath or chest pain or cough.   REVIEW OF SYSTEMS:  A complete 10 point review of system is done which is negative except mentioned above/history of present illness.   PAST MEDICAL HISTORY :  Past Medical History  Diagnosis Date  . Hypertension   . DVT (deep venous thrombosis) (HCC)     LEFT LEG   . MI (myocardial infarction) ([place]   . Diabetes mellitus without complication ([facility]   . Seizure disorder ([facility]   . Coronary artery disease     a. NSTEMI cath 08/02/14: LM nl, pLAD 50%, mLCx 30%, mRCA 95% s/p PCI/DES, 2nd lesion 40%, EF 55%  . HLD (hyperlipidemia)   . Lung nodule   . Anxiety   . Seizures ([facility]   . Squamous cell lung cancer  ([facility] 04/23/2015  . Cancer of right female breast ([place]   . Breast cancer ([facility] 08/2015    right breast, chemo    PAST SURGICAL HISTORY :   Past Surgical History  Procedure Laterality Date  . Abdominal hysterectomy      PARTIAL   . Leg surgery Right     BLOOD CLOT  . Cardiac catheterization  08/02/2014  . Coronary angioplasty  08/02/2014    drug eluting stent placement  . Ivc filter placement ([facility] hx)    . Portacath placement Left 10/13/2015    Procedure: INSERTION PORT-A-CATH;  Surgeon: CHubbard Robinson MD;  Location: [facility] ORS;  Service: General;  Laterality: Left;  . Breast biopsy Right 09/15/2015    positve    FAMILY HISTORY :   Family History  Problem Relation Age of Onset  . Heart attack Mother   . Breast cancer Neg Hx     SOCIAL HISTORY:   Social History  Substance Use Topics  . Smoking status: Current Every Day Smoker -- 0.50 packs/day for 45 years    Types: Cigarettes  . Smokeless tobacco: Never Used  . Alcohol Use: No    ALLERGIES:  is allergic to penicillins.  MEDICATIONS:  Current Outpatient Prescriptions  Medication Sig Dispense Refill  . apixaban (ELIQUIS) 2.5 MG TABS tablet Take 2.5 mg by mouth 2 (two) times daily. Reported on 01/01/2016    . Aspirin-Salicylamide-Caffeine (BC HEADACHE POWDER  PO) Take 1 packet by mouth daily.    Marland Kitchen atorvastatin (LIPITOR) 40 MG tablet Take 1 tablet by mouth 1 day or 1 dose. Reported on 12/16/2015    . diazepam (VALIUM) 5 MG tablet Take 1 tablet by mouth 2 (two) times daily as needed. Reported on 12/16/2015    . empagliflozin (JARDIANCE) 25 MG TABS tablet Take 25 mg by mouth daily.    Marland Kitchen HYDROcodone-acetaminophen (NORCO/VICODIN) 5-325 MG tablet Take 1 tablet by mouth every 12 (twelve) hours as needed. For pain 30 tablet 0  . lidocaine-prilocaine (EMLA) cream Apply 1 application topically as needed. Apply to port a cath site 1 hour before chemotherapy treatment. 30 g 6  . lisinopril-hydrochlorothiazide (PRINZIDE,ZESTORETIC)  20-25 MG per tablet Take 1 tablet by mouth daily.     . metFORMIN (GLUCOPHAGE) 1000 MG tablet Take 1 tablet by mouth 2 (two) times daily. Reported on 10/08/2015    . naproxen (NAPROSYN) 500 MG tablet Take 1 tablet by mouth 2 (two) times daily. Reported on 11/03/2015    . nitroGLYCERIN (NITROSTAT) 0.4 MG SL tablet Place 0.4 mg under the tongue every 5 (five) minutes as needed for chest pain. Reported on 12/16/2015    . ondansetron (ZOFRAN) 8 MG tablet Take 1 tablet (8 mg total) by mouth every 8 (eight) hours as needed for nausea or vomiting (start 3 days; after chemo). 40 tablet 0  . phenytoin (DILANTIN) 100 MG ER capsule Take 300 mg by mouth daily.     . pioglitazone (ACTOS) 45 MG tablet Take 1 tablet by mouth daily.    . prochlorperazine (COMPAZINE) 10 MG tablet Take 1 tablet (10 mg total) by mouth every 6 (six) hours as needed for nausea or vomiting. 40 tablet 0   No current facility-administered medications for this visit.   Facility-Administered Medications Ordered in Other Visits  Medication Dose Route Frequency Provider Last Rate Last Dose  . albuterol (PROVENTIL) (2.5 MG/3ML) 0.083% nebulizer solution 2.5 mg  2.5 mg Nebulization Once Nestor Lewandowsky, MD      . heparin lock flush 100 unit/mL  500 Units Intracatheter Once PRN Cammie Sickle, MD      . heparin lock flush 100 unit/mL  500 Units Intravenous Once Cammie Sickle, MD      . sodium chloride flush (NS) 0.9 % injection 10 mL  10 mL Intravenous PRN Cammie Sickle, MD   10 mL at 12/01/15 0850  . sodium chloride flush (NS) 0.9 % injection 10 mL  10 mL Intravenous PRN Cammie Sickle, MD        PHYSICAL EXAMINATION: ECOG PERFORMANCE STATUS: 0 - Asymptomatic  BP 134/79 mmHg  Pulse 83  Temp(Src) 97.3 F (36.3 C) (Tympanic)  Resp 16  Wt 197 lb 5 oz (89.5 kg)  Filed Weights   01/01/16 0855  Weight: 197 lb 5 oz (89.5 kg)    GENERAL: Well-nourished well-developed; Alert, no distress and comfortable. Alone;   EYES: no pallor or icterus OROPHARYNX: no thrush or ulceration; good dentition  NECK: supple, no masses felt LYMPH: no palpable lymphadenopathy in the cervical, axillary or inguinal regions LUNGS: clear to auscultation and No wheeze or crackles HEART/CVS: regular rate & rhythm and no murmurs; No lower extremity edema ABDOMEN:abdomen soft, non-tender and normal bowel sounds Musculoskeletal:no cyanosis of digits and no clubbing  PSYCH: alert & oriented x 3 with fluent speech NEURO: no focal motor/sensory deficits SKIN: no rashes or significant lesions.  Right Breast- 4 x5 cm breast mass; mobile [  smaller]; exam in presence of RN.    LABORATORY DATA:  I have reviewed the data as listed    Component Value Date/Time   NA 136 01/01/2016 0823   NA 141 08/03/2014 0050   K 3.2* 01/01/2016 0823   K 3.3* 08/03/2014 0050   CL 101 01/01/2016 0823   CL 108* 08/03/2014 0050   CO2 26 01/01/2016 0823   CO2 27 08/03/2014 0050   GLUCOSE 173* 01/01/2016 0823   GLUCOSE 164* 08/03/2014 0050   BUN 25* 01/01/2016 0823   BUN 14 08/03/2014 0050   CREATININE 0.75 01/01/2016 0823   CREATININE 0.68 08/03/2014 0050   CALCIUM 8.9 01/01/2016 0823   CALCIUM 8.2* 08/03/2014 0050   PROT 7.4 01/01/2016 0823   PROT 6.7 04/11/2013 2241   ALBUMIN 3.7 01/01/2016 0823   ALBUMIN 2.7* 04/11/2013 2241   AST 19 01/01/2016 0823   AST 13* 04/11/2013 2241   ALT 18 01/01/2016 0823   ALT 16 04/11/2013 2241   ALKPHOS 214* 01/01/2016 0823   ALKPHOS 207* 04/11/2013 2241   BILITOT 0.3 01/01/2016 0823   BILITOT 0.1* 04/11/2013 2241   GFRNONAA >60 01/01/2016 0823   GFRNONAA >60 08/03/2014 0050   GFRNONAA >60 04/11/2013 2241   GFRAA >60 01/01/2016 0823   GFRAA >60 08/03/2014 0050   GFRAA >60 04/11/2013 2241    No results found for: SPEP, UPEP  Lab Results  Component Value Date   WBC 7.1 01/01/2016   NEUTROABS 3.8 01/01/2016   HGB 11.9* 01/01/2016   HCT 34.8* 01/01/2016   MCV 85.2 01/01/2016   PLT 142*  01/01/2016      Chemistry      Component Value Date/Time   NA 136 01/01/2016 0823   NA 141 08/03/2014 0050   K 3.2* 01/01/2016 0823   K 3.3* 08/03/2014 0050   CL 101 01/01/2016 0823   CL 108* 08/03/2014 0050   CO2 26 01/01/2016 0823   CO2 27 08/03/2014 0050   BUN 25* 01/01/2016 0823   BUN 14 08/03/2014 0050   CREATININE 0.75 01/01/2016 0823   CREATININE 0.68 08/03/2014 0050      Component Value Date/Time   CALCIUM 8.9 01/01/2016 0823   CALCIUM 8.2* 08/03/2014 0050   ALKPHOS 214* 01/01/2016 0823   ALKPHOS 207* 04/11/2013 2241   AST 19 01/01/2016 0823   AST 13* 04/11/2013 2241   ALT 18 01/01/2016 0823   ALT 16 04/11/2013 2241   BILITOT 0.3 01/01/2016 0823   BILITOT 0.1* 04/11/2013 2241    12/26/2015:   Ultrasound targeted to the right breast at 11 o'clock, 8 cm from the nipple demonstrates again demonstrates the irregular hypoechoic mass which measures approximately 2.2 x 1.2 x 1.3 cm, previously 3.7 x 1.5 x 2.7 cm in radial and anti radial planes. A few small distance satellites can still be seen, with representative images taken of a 6 mm satellite approximately 2.5 cm closer to the nipple than the medial margin of the mass and a few small satellites approximately 3 cm more distant from the nipple from the margin of the mass.    ASSESSMENT & PLAN:   # Breast Ca- ER/PR- Neg; Her2-NEG. T3N0- STAGE II; patient has a large primary breast malignancy [7-8 cm in size]; triple negative- on neoadjuvant chemo; post 10 weekly treatments carbotaxol- Tumor getting smaller in size/as above.  # Proceed with  Carboplatin AUC 2- Taxol 80 mg/m weekly; proceed with Cycle #11 today.  CBC/CMP unremarkable except for elevated blood glucose of  214/ Mild hypokalemia potassium 3.2 monitor for now.  # After finishing 12 treatments of carbotaxol- patient will start Adriamycin Cytoxan every 3 weeks [starting on May 30th].   # Muscle cramps from Taxol. Continue hydrocodone one every 12 hours  as needed. New prescription.  #  I'll see the patient back in 2 weeks/ CBC CMP/ chemotherapy;  Next week-  CBC BMP/ chemotherapy.     Cammie Sickle, MD 01/01/2016 9:22 AM

## 2016-01-01 NOTE — Progress Notes (Signed)
Patient had her breast ultrasound on 12/26/15.  She reports that she has leg weakness and not able to do much walking around her house due to leg weakness.

## 2016-01-06 ENCOUNTER — Other Ambulatory Visit: Payer: Self-pay | Admitting: Internal Medicine

## 2016-01-06 ENCOUNTER — Inpatient Hospital Stay: Payer: Medicare Other

## 2016-01-06 VITALS — BP 125/77 | HR 76 | Temp 97.6°F | Resp 18

## 2016-01-06 DIAGNOSIS — C50911 Malignant neoplasm of unspecified site of right female breast: Secondary | ICD-10-CM

## 2016-01-06 DIAGNOSIS — C50819 Malignant neoplasm of overlapping sites of unspecified female breast: Secondary | ICD-10-CM

## 2016-01-06 DIAGNOSIS — Z5111 Encounter for antineoplastic chemotherapy: Secondary | ICD-10-CM | POA: Diagnosis not present

## 2016-01-06 LAB — BASIC METABOLIC PANEL
Anion gap: 8 (ref 5–15)
BUN: 31 mg/dL — ABNORMAL HIGH (ref 6–20)
CALCIUM: 8.7 mg/dL — AB (ref 8.9–10.3)
CO2: 27 mmol/L (ref 22–32)
CREATININE: 0.86 mg/dL (ref 0.44–1.00)
Chloride: 99 mmol/L — ABNORMAL LOW (ref 101–111)
GFR calc non Af Amer: >60 mL/min (ref >60)
Glucose, Bld: 171 mg/dL — ABNORMAL HIGH (ref 65–99)
Potassium: 3.4 mmol/L — ABNORMAL LOW (ref 3.5–5.1)
Sodium: 134 mmol/L — ABNORMAL LOW (ref 135–145)

## 2016-01-06 LAB — CBC WITH DIFFERENTIAL/PLATELET
Basophils Absolute: 0 10*3/uL (ref 0–0.1)
EOS ABS: 0.1 10*3/uL (ref 0–0.7)
HEMATOCRIT: 33.4 % — AB (ref 35.0–47.0)
Hemoglobin: 11.1 g/dL — ABNORMAL LOW (ref 12.0–16.0)
Lymphocytes Relative: 31 %
Lymphs Abs: 1.3 10*3/uL (ref 1.0–3.6)
MCH: 29.2 pg (ref 26.0–34.0)
MCHC: 33.4 g/dL (ref 32.0–36.0)
MCV: 87.3 fL (ref 80.0–100.0)
MONO ABS: 0.3 10*3/uL (ref 0.2–0.9)
NEUTROS ABS: 2.6 10*3/uL (ref 1.4–6.5)
Neutrophils Relative %: 60 %
Platelets: 98 10*3/uL — ABNORMAL LOW (ref 150–440)
RBC: 3.82 MIL/uL (ref 3.80–5.20)
RDW: 22.9 % — AB (ref 11.5–14.5)
WBC: 4.3 10*3/uL (ref 3.6–11.0)

## 2016-01-06 MED ORDER — CARBOPLATIN CHEMO INJECTION 450 MG/45ML
240.0000 mg | Freq: Once | INTRAVENOUS | Status: AC
  Administered 2016-01-06: 240 mg via INTRAVENOUS
  Filled 2016-01-06: qty 24

## 2016-01-06 MED ORDER — FAMOTIDINE IN NACL 20-0.9 MG/50ML-% IV SOLN
20.0000 mg | Freq: Once | INTRAVENOUS | Status: AC
  Administered 2016-01-06: 20 mg via INTRAVENOUS
  Filled 2016-01-06: qty 50

## 2016-01-06 MED ORDER — SODIUM CHLORIDE 0.9 % IV SOLN
Freq: Once | INTRAVENOUS | Status: AC
  Administered 2016-01-06: 10:00:00 via INTRAVENOUS
  Filled 2016-01-06: qty 1000

## 2016-01-06 MED ORDER — SODIUM CHLORIDE 0.9% FLUSH
10.0000 mL | INTRAVENOUS | Status: DC | PRN
  Administered 2016-01-06: 10 mL via INTRAVENOUS
  Filled 2016-01-06: qty 10

## 2016-01-06 MED ORDER — PACLITAXEL CHEMO INJECTION 300 MG/50ML
80.0000 mg/m2 | Freq: Once | INTRAVENOUS | Status: AC
  Administered 2016-01-06: 168 mg via INTRAVENOUS
  Filled 2016-01-06: qty 28

## 2016-01-06 MED ORDER — DIPHENHYDRAMINE HCL 50 MG/ML IJ SOLN
25.0000 mg | Freq: Once | INTRAMUSCULAR | Status: AC
  Administered 2016-01-06: 25 mg via INTRAVENOUS
  Filled 2016-01-06: qty 1

## 2016-01-06 MED ORDER — PALONOSETRON HCL INJECTION 0.25 MG/5ML
0.2500 mg | Freq: Once | INTRAVENOUS | Status: AC
  Administered 2016-01-06: 0.25 mg via INTRAVENOUS
  Filled 2016-01-06: qty 5

## 2016-01-06 MED ORDER — HEPARIN SOD (PORK) LOCK FLUSH 100 UNIT/ML IV SOLN
500.0000 [IU] | Freq: Once | INTRAVENOUS | Status: AC
  Administered 2016-01-06: 500 [IU] via INTRAVENOUS
  Filled 2016-01-06: qty 5

## 2016-01-06 MED ORDER — SODIUM CHLORIDE 0.9 % IV SOLN
10.0000 mg | Freq: Once | INTRAVENOUS | Status: AC
  Administered 2016-01-06: 10 mg via INTRAVENOUS
  Filled 2016-01-06: qty 1

## 2016-01-06 NOTE — Progress Notes (Signed)
[name] notified of platelet level and he approved today's administration of chemotherapy.

## 2016-01-09 ENCOUNTER — Telehealth: Payer: Self-pay | Admitting: *Deleted

## 2016-01-09 NOTE — Telephone Encounter (Signed)
Per Dr Rogue Bussing, patient to use Vaseline on her feet. Patient advised of this and stated she will do that and will also try soaking in [facility]

## 2016-01-09 NOTE — Telephone Encounter (Signed)
Called to report that her feet are very dry and flaky and that her toes are painful from the dryness and cracking. She is using lotion at night and it makes them feel better until she gets up the next morning. Please advise.

## 2016-01-12 ENCOUNTER — Encounter: Payer: Self-pay | Admitting: *Deleted

## 2016-01-12 NOTE — Progress Notes (Signed)
  Oncology Nurse Navigator Documentation  Navigator Location: [facility] (01/12/16 1500) Navigator Encounter Type: Telephone (01/12/16 1500) Telephone: Incoming Call (01/12/16 1500)             Barriers/Navigation Needs: Coordination of Care (01/12/16 1500)   Interventions: Coordination of Care (01/12/16 1500)            Acuity: Level 4 (01/12/16 1500)         Time Spent with Patient: 15 (01/12/16 1500)   Patient called and stated she had gotten a text from [facility] and was confused about her appointment date.  Discussed case with Barnabas Lister the Education officer, museum and confirmed with the patient that the [name] would not be running next Tuesday, but we had arranged for a cab to pick her up take her to the [facility] and then return her home.  She is agreeable to the plan.

## 2016-01-14 ENCOUNTER — Telehealth: Payer: Self-pay | Admitting: *Deleted

## 2016-01-14 MED ORDER — GABAPENTIN 300 MG PO CAPS: 300.0000 mg | ORAL_CAPSULE | Freq: Three times a day (TID) | ORAL | Status: DC

## 2016-01-14 NOTE — Telephone Encounter (Signed)
Neurontin 300 mg tid ordered per Dr Rogue Bussing. Patient informed

## 2016-01-14 NOTE — Telephone Encounter (Signed)
-----   Message from [place] sent at 01/14/2016 11:41 AM EDT ----- Contact: [phone] Patient left message stating she needed to talk with a nurse about her feet. Stated she is using Epsom salt and vasoline and it is not helping. Please return call.

## 2016-01-20 ENCOUNTER — Inpatient Hospital Stay (HOSPITAL_BASED_OUTPATIENT_CLINIC_OR_DEPARTMENT_OTHER): Payer: Medicare Other | Admitting: Internal Medicine

## 2016-01-20 ENCOUNTER — Inpatient Hospital Stay: Payer: Medicare Other

## 2016-01-20 VITALS — BP 154/92 | HR 81 | Temp 97.9°F | Resp 18 | Wt 197.5 lb

## 2016-01-20 DIAGNOSIS — C50411 Malignant neoplasm of upper-outer quadrant of right female breast: Secondary | ICD-10-CM

## 2016-01-20 DIAGNOSIS — C50911 Malignant neoplasm of unspecified site of right female breast: Secondary | ICD-10-CM

## 2016-01-20 DIAGNOSIS — Z85118 Personal history of other malignant neoplasm of bronchus and lung: Secondary | ICD-10-CM

## 2016-01-20 DIAGNOSIS — T451X5A Adverse effect of antineoplastic and immunosuppressive drugs, initial encounter: Secondary | ICD-10-CM

## 2016-01-20 DIAGNOSIS — Z5111 Encounter for antineoplastic chemotherapy: Secondary | ICD-10-CM | POA: Diagnosis not present

## 2016-01-20 DIAGNOSIS — R112 Nausea with vomiting, unspecified: Secondary | ICD-10-CM

## 2016-01-20 DIAGNOSIS — D6959 Other secondary thrombocytopenia: Secondary | ICD-10-CM

## 2016-01-20 DIAGNOSIS — R252 Cramp and spasm: Secondary | ICD-10-CM

## 2016-01-20 DIAGNOSIS — Z923 Personal history of irradiation: Secondary | ICD-10-CM | POA: Diagnosis not present

## 2016-01-20 DIAGNOSIS — I10 Essential (primary) hypertension: Secondary | ICD-10-CM

## 2016-01-20 DIAGNOSIS — Z171 Estrogen receptor negative status [ER-]: Secondary | ICD-10-CM

## 2016-01-20 DIAGNOSIS — I2510 Atherosclerotic heart disease of native coronary artery without angina pectoris: Secondary | ICD-10-CM

## 2016-01-20 DIAGNOSIS — E876 Hypokalemia: Secondary | ICD-10-CM

## 2016-01-20 DIAGNOSIS — K5900 Constipation, unspecified: Secondary | ICD-10-CM

## 2016-01-20 DIAGNOSIS — T451X5S Adverse effect of antineoplastic and immunosuppressive drugs, sequela: Secondary | ICD-10-CM

## 2016-01-20 DIAGNOSIS — E1165 Type 2 diabetes mellitus with hyperglycemia: Secondary | ICD-10-CM

## 2016-01-20 DIAGNOSIS — Z95828 Presence of other vascular implants and grafts: Secondary | ICD-10-CM

## 2016-01-20 LAB — CBC WITH DIFFERENTIAL/PLATELET
Basophils Absolute: 0 10*3/uL (ref 0–0.1)
Basophils Relative: 0 %
EOS ABS: 0.1 10*3/uL (ref 0–0.7)
HCT: 31.9 % — ABNORMAL LOW (ref 35.0–47.0)
Hemoglobin: 10.6 g/dL — ABNORMAL LOW (ref 12.0–16.0)
LYMPHS ABS: 2.1 10*3/uL (ref 1.0–3.6)
Lymphocytes Relative: 46 %
MCH: 30 pg (ref 26.0–34.0)
MCHC: 33.1 g/dL (ref 32.0–36.0)
MCV: 90.8 fL (ref 80.0–100.0)
Monocytes Absolute: 0.3 10*3/uL (ref 0.2–0.9)
Neutro Abs: 2 10*3/uL (ref 1.4–6.5)
Neutrophils Relative %: 45 %
PLATELETS: 63 10*3/uL — AB (ref 150–440)
RBC: 3.52 MIL/uL — ABNORMAL LOW (ref 3.80–5.20)
RDW: 24.2 % — ABNORMAL HIGH (ref 11.5–14.5)
WBC: 4.5 10*3/uL (ref 3.6–11.0)

## 2016-01-20 LAB — COMPREHENSIVE METABOLIC PANEL
ALT: 16 U/L (ref 14–54)
ANION GAP: 7 (ref 5–15)
AST: 15 U/L (ref 15–41)
Albumin: 3.4 g/dL — ABNORMAL LOW (ref 3.5–5.0)
Alkaline Phosphatase: 215 U/L — ABNORMAL HIGH (ref 38–126)
BUN: 26 mg/dL — ABNORMAL HIGH (ref 6–20)
CALCIUM: 8.7 mg/dL — AB (ref 8.9–10.3)
CHLORIDE: 101 mmol/L (ref 101–111)
CO2: 27 mmol/L (ref 22–32)
CREATININE: 0.82 mg/dL (ref 0.44–1.00)
GFR calc non Af Amer: >60 mL/min (ref >60)
GLUCOSE: 176 mg/dL — AB (ref 65–99)
Potassium: 3.5 mmol/L (ref 3.5–5.1)
SODIUM: 135 mmol/L (ref 135–145)
TOTAL PROTEIN: 7.3 g/dL (ref 6.5–8.1)
Total Bilirubin: 0.4 mg/dL (ref 0.3–1.2)

## 2016-01-20 MED ORDER — SODIUM CHLORIDE 0.9% FLUSH
10.0000 mL | INTRAVENOUS | Status: DC | PRN
  Administered 2016-01-20: 10 mL via INTRAVENOUS
  Filled 2016-01-20: qty 10

## 2016-01-20 MED ORDER — HEPARIN SOD (PORK) LOCK FLUSH 100 UNIT/ML IV SOLN
INTRAVENOUS | Status: AC
  Filled 2016-01-20: qty 5

## 2016-01-20 MED ORDER — HEPARIN SOD (PORK) LOCK FLUSH 100 UNIT/ML IV SOLN
500.0000 [IU] | Freq: Once | INTRAVENOUS | Status: AC
  Administered 2016-01-20: 500 [IU] via INTRAVENOUS

## 2016-01-20 MED ORDER — HYDROCODONE-ACETAMINOPHEN 5-325 MG PO TABS: 1.0000 | ORAL_TABLET | Freq: Two times a day (BID) | ORAL | Status: DC | PRN

## 2016-01-20 MED ORDER — ONDANSETRON HCL 8 MG PO TABS: 8.0000 mg | ORAL_TABLET | Freq: Three times a day (TID) | ORAL | Status: DC | PRN

## 2016-01-20 NOTE — Progress Notes (Signed)
Patient states she hurts at her port site all the time.  Also states she has exertional SOB.  She is unable to stand and walk like she once did without her legs giving out.  States her feet hurt all the time and are discolored and cracking around her toenails.

## 2016-01-20 NOTE — Progress Notes (Signed)
Patient was scheduled for new chemotherapy today. Platelets are tool low for Tx. Teaching and handouts given for Adriamycin and Cytoxan today. Samples of Boost given to patient as well to take home with her.

## 2016-01-20 NOTE — Progress Notes (Signed)
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   # Smoker; ? Paranoia; Feb 2017- MUGA scan- 67%.   INTERVAL HISTORY: Poor- vague historian  65year old female patient with above history of newly diagnosed breast cancer- triple negative; clinical stage II- currently on neo-adjuvant; status post weekly carbo-taxol x 12- is here for follow-up/ to proceed with cycle #1 of Adriamycin Cytoxan today.  Patient continues to complain of pain in her port area especially after manipulation for blood draw/IV access. She continues to take hydrocodone one pill twice a day as needed.  Patient states that she had Intolerance to Neurontin. Patient continues to complain of pain in her leg muscles. Complains of fatigue. Complains of poor appetite.  Complains of constipation; on dulcolax. Otherwise patient denies any unusual shortness of breath or chest pain or cough.   REVIEW OF SYSTEMS:  A complete 10 point review of system is done which is negative except mentioned above/history of present illness.   PAST MEDICAL HISTORY :  Past Medical History  Diagnosis Date  . Hypertension   . DVT (deep venous thrombosis) (HCC)     LEFT LEG   . MI (myocardial infarction) ([place]   . Diabetes mellitus without complication (HR[place]   . Seizure disorder ([place]   . Coronary artery disease     a. NSTEMI cath 08/02/14: LM nl, pLAD 50%, mLCx 30%, mRCA 95% s/p PCI/DES, 2nd lesion 40%, EF 55%  . HLD (hyperlipidemia)   . Lung nodule   .  Anxiety   . Seizures ([place]   . Squamous cell lung cancer ([place] 04/23/2015  . Cancer of right female breast ([place]   . Breast cancer ([place] 08/2015    right breast, chemo    PAST SURGICAL HISTORY :   Past Surgical History  Procedure Laterality Date  . Abdominal hysterectomy      PARTIAL   . Leg surgery Right     BLOOD CLOT  . Cardiac catheterization  08/02/2014  . Coronary angioplasty  08/02/2014    drug eluting stent placement  . Ivc filter placement ([facility] hx)    . Portacath placement Left 10/13/2015    Procedure: INSERTION PORT-A-CATH;  Surgeon: CHubbard Robinson MD;  Location: [facility] ORS;  Service: General;  Laterality: Left;  . Breast biopsy Right 09/15/2015    positve    FAMILY HISTORY :   Family History  Problem Relation Age of Onset  . Heart attack Mother   . Breast cancer Neg Hx     SOCIAL HISTORY:   Social History  Substance Use Topics  . Smoking status: Current Every Day Smoker -- 0.50 packs/day for 45 years    Types: Cigarettes  . Smokeless tobacco: Never Used  . Alcohol Use: No    ALLERGIES:  is allergic to penicillins.  MEDICATIONS:  Current Outpatient Prescriptions  Medication Sig Dispense Refill  . apixaban (ELIQUIS) 2.5 MG TABS tablet Take 2.5 mg by mouth 2 (two) times daily.  Reported on 01/01/2016    . Aspirin-Salicylamide-Caffeine (BC HEADACHE POWDER PO) Take 1 packet by mouth daily.    Marland Kitchen atorvastatin (LIPITOR) 40 MG tablet Take 1 tablet by mouth 1 day or 1 dose. Reported on 12/16/2015    . diazepam (VALIUM) 5 MG tablet Take 1 tablet by mouth 2 (two) times daily as needed. Reported on 12/16/2015    . empagliflozin (JARDIANCE) 25 MG TABS tablet Take 25 mg by mouth daily.    Marland Kitchen gabapentin (NEURONTIN) 300 MG capsule Take 1 capsule (300 mg total) by mouth 3 (three) times daily. 90 capsule 0  . HYDROcodone-acetaminophen (NORCO/VICODIN) 5-325 MG tablet Take 1 tablet by mouth every 12 (twelve) hours as needed. For pain 30 tablet 0  . lidocaine-prilocaine (EMLA)  cream Apply 1 application topically as needed. Apply to port a cath site 1 hour before chemotherapy treatment. 30 g 6  . lisinopril-hydrochlorothiazide (PRINZIDE,ZESTORETIC) 20-25 MG per tablet Take 1 tablet by mouth daily.     . metFORMIN (GLUCOPHAGE) 1000 MG tablet Take 1 tablet by mouth 2 (two) times daily. Reported on 10/08/2015    . naproxen (NAPROSYN) 500 MG tablet Take 1 tablet by mouth 2 (two) times daily. Reported on 11/03/2015    . nitroGLYCERIN (NITROSTAT) 0.4 MG SL tablet Place 0.4 mg under the tongue every 5 (five) minutes as needed for chest pain. Reported on 12/16/2015    . ondansetron (ZOFRAN) 8 MG tablet Take 1 tablet (8 mg total) by mouth every 8 (eight) hours as needed for nausea or vomiting (start 3 days; after chemo). 40 tablet 0  . phenytoin (DILANTIN) 100 MG ER capsule Take 300 mg by mouth daily.     . pioglitazone (ACTOS) 45 MG tablet Take 1 tablet by mouth daily.    . prochlorperazine (COMPAZINE) 10 MG tablet Take 1 tablet (10 mg total) by mouth every 6 (six) hours as needed for nausea or vomiting. 40 tablet 0  . LINZESS 145 MCG CAPS capsule Reported on 01/20/2016    . VENTOLIN HFA 108 (90 Base) MCG/ACT inhaler Reported on 01/20/2016     No current facility-administered medications for this visit.   Facility-Administered Medications Ordered in Other Visits  Medication Dose Route Frequency Provider Last Rate Last Dose  . albuterol (PROVENTIL) (2.5 MG/3ML) 0.083% nebulizer solution 2.5 mg  2.5 mg Nebulization Once Nestor Lewandowsky, MD      . heparin lock flush 100 unit/mL  500 Units Intracatheter Once PRN Cammie Sickle, MD      . sodium chloride flush (NS) 0.9 % injection 10 mL  10 mL Intravenous PRN Cammie Sickle, MD   10 mL at 12/01/15 0850  . sodium chloride flush (NS) 0.9 % injection 10 mL  10 mL Intravenous PRN Cammie Sickle, MD   10 mL at 01/20/16 0938    PHYSICAL EXAMINATION: ECOG PERFORMANCE STATUS: 0 - Asymptomatic  BP 154/92 mmHg  Pulse 81   Temp(Src) 97.9 F (36.6 C) (Tympanic)  Resp 18  Wt 197 lb 8.5 oz (89.6 kg)  Filed Weights   01/20/16 1004  Weight: 197 lb 8.5 oz (89.6 kg)    GENERAL: Well-nourished well-developed; Alert, no distress and comfortable. Alone;  EYES: no pallor or icterus OROPHARYNX: no thrush or ulceration; good dentition  NECK: supple, no masses felt LYMPH: no palpable lymphadenopathy in the cervical, axillary or inguinal regions LUNGS: clear to auscultation and No wheeze or crackles HEART/CVS: regular rate & rhythm and no murmurs; No lower extremity edema ABDOMEN:abdomen soft,  non-tender and normal bowel sounds Musculoskeletal:no cyanosis of digits and no clubbing  PSYCH: alert & oriented x 3 with fluent speech NEURO: no focal motor/sensory deficits SKIN: no rashes or significant lesions.   LABORATORY DATA:  I have reviewed the data as listed    Component Value Date/Time   NA 135 01/20/2016 0922   NA 141 08/03/2014 0050   K 3.5 01/20/2016 0922   K 3.3* 08/03/2014 0050   CL 101 01/20/2016 0922   CL 108* 08/03/2014 0050   CO2 27 01/20/2016 0922   CO2 27 08/03/2014 0050   GLUCOSE 176* 01/20/2016 0922   GLUCOSE 164* 08/03/2014 0050   BUN 26* 01/20/2016 0922   BUN 14 08/03/2014 0050   CREATININE 0.82 01/20/2016 0922   CREATININE 0.68 08/03/2014 0050   CALCIUM 8.7* 01/20/2016 0922   CALCIUM 8.2* 08/03/2014 0050   PROT 7.3 01/20/2016 0922   PROT 6.7 04/11/2013 2241   ALBUMIN 3.4* 01/20/2016 0922   ALBUMIN 2.7* 04/11/2013 2241   AST 15 01/20/2016 0922   AST 13* 04/11/2013 2241   ALT 16 01/20/2016 0922   ALT 16 04/11/2013 2241   ALKPHOS 215* 01/20/2016 0922   ALKPHOS 207* 04/11/2013 2241   BILITOT 0.4 01/20/2016 0922   BILITOT 0.1* 04/11/2013 2241   GFRNONAA >60 01/20/2016 0922   GFRNONAA >60 08/03/2014 0050   GFRNONAA >60 04/11/2013 2241   GFRAA >60 01/20/2016 0922   GFRAA >60 08/03/2014 0050   GFRAA >60 04/11/2013 2241    No results found for: SPEP, UPEP  Lab Results   Component Value Date   WBC 4.5 01/20/2016   NEUTROABS 2.0 01/20/2016   HGB 10.6* 01/20/2016   HCT 31.9* 01/20/2016   MCV 90.8 01/20/2016   PLT 63* 01/20/2016      Chemistry      Component Value Date/Time   NA 135 01/20/2016 0922   NA 141 08/03/2014 0050   K 3.5 01/20/2016 0922   K 3.3* 08/03/2014 0050   CL 101 01/20/2016 0922   CL 108* 08/03/2014 0050   CO2 27 01/20/2016 0922   CO2 27 08/03/2014 0050   BUN 26* 01/20/2016 0922   BUN 14 08/03/2014 0050   CREATININE 0.82 01/20/2016 0922   CREATININE 0.68 08/03/2014 0050      Component Value Date/Time   CALCIUM 8.7* 01/20/2016 0922   CALCIUM 8.2* 08/03/2014 0050   ALKPHOS 215* 01/20/2016 0922   ALKPHOS 207* 04/11/2013 2241   AST 15 01/20/2016 0922   AST 13* 04/11/2013 2241   ALT 16 01/20/2016 0922   ALT 16 04/11/2013 2241   BILITOT 0.4 01/20/2016 0922   BILITOT 0.1* 04/11/2013 2241    12/26/2015:   Ultrasound targeted to the right breast at 11 o'clock, 8 cm from the nipple demonstrates again demonstrates the irregular hypoechoic mass which measures approximately 2.2 x 1.2 x 1.3 cm, previously 3.7 x 1.5 x 2.7 cm in radial and anti radial planes. A few small distance satellites can still be seen, with representative images taken of a 6 mm satellite approximately 2.5 cm closer to the nipple than the medial margin of the mass and a few small satellites approximately 3 cm more distant from the nipple from the margin of the mass.    ASSESSMENT & PLAN:   # Breast Ca- ER/PR- Neg; Her2-NEG. T3N0- STAGE II; patient has a large primary breast malignancy [7-8 cm in size]; triple negative- on neoadjuvant chemo;Status post 12 weekly treatments of carbotaxol. Partial response noted after 12  treatments.  # HOLD Adriamycin and Cytoxan every 3 week cycle #1 sec to thrombocytopenia [platelet 63]. We'll reevaluate CBC in 1 week; and then proceed with chemotherapy.  # Intolerance to Neurontin; discontinue Neurontin.  # Muscle  cramps from Taxol. Continue hydrocodone one every 12 hours as needed. New prescription.  # Labs 1 week/ Adriamycin Cytoxan cycle #1 next week; follow-up with me in 4 weeks with cycle #2 of Adriamycin Cytoxan.    Cammie Sickle, MD 01/20/2016 11:30 AM

## 2016-01-27 ENCOUNTER — Inpatient Hospital Stay: Payer: Medicare Other

## 2016-01-27 ENCOUNTER — Other Ambulatory Visit: Payer: Self-pay | Admitting: Internal Medicine

## 2016-01-27 ENCOUNTER — Other Ambulatory Visit: Payer: Self-pay | Admitting: *Deleted

## 2016-01-27 ENCOUNTER — Inpatient Hospital Stay: Payer: Medicare Other | Attending: Internal Medicine

## 2016-01-27 VITALS — BP 137/80 | HR 97 | Temp 97.0°F | Resp 18

## 2016-01-27 DIAGNOSIS — Z5111 Encounter for antineoplastic chemotherapy: Secondary | ICD-10-CM | POA: Insufficient documentation

## 2016-01-27 DIAGNOSIS — Z171 Estrogen receptor negative status [ER-]: Secondary | ICD-10-CM | POA: Insufficient documentation

## 2016-01-27 DIAGNOSIS — R112 Nausea with vomiting, unspecified: Secondary | ICD-10-CM

## 2016-01-27 DIAGNOSIS — C50811 Malignant neoplasm of overlapping sites of right female breast: Secondary | ICD-10-CM

## 2016-01-27 DIAGNOSIS — R630 Anorexia: Secondary | ICD-10-CM | POA: Insufficient documentation

## 2016-01-27 DIAGNOSIS — E785 Hyperlipidemia, unspecified: Secondary | ICD-10-CM | POA: Insufficient documentation

## 2016-01-27 DIAGNOSIS — Z7901 Long term (current) use of anticoagulants: Secondary | ICD-10-CM | POA: Insufficient documentation

## 2016-01-27 DIAGNOSIS — Z7982 Long term (current) use of aspirin: Secondary | ICD-10-CM | POA: Insufficient documentation

## 2016-01-27 DIAGNOSIS — I252 Old myocardial infarction: Secondary | ICD-10-CM | POA: Insufficient documentation

## 2016-01-27 DIAGNOSIS — T451X5A Adverse effect of antineoplastic and immunosuppressive drugs, initial encounter: Secondary | ICD-10-CM

## 2016-01-27 DIAGNOSIS — Z86718 Personal history of other venous thrombosis and embolism: Secondary | ICD-10-CM | POA: Diagnosis not present

## 2016-01-27 DIAGNOSIS — K0889 Other specified disorders of teeth and supporting structures: Secondary | ICD-10-CM | POA: Insufficient documentation

## 2016-01-27 DIAGNOSIS — I10 Essential (primary) hypertension: Secondary | ICD-10-CM | POA: Diagnosis not present

## 2016-01-27 DIAGNOSIS — Z7984 Long term (current) use of oral hypoglycemic drugs: Secondary | ICD-10-CM | POA: Diagnosis not present

## 2016-01-27 DIAGNOSIS — Z85118 Personal history of other malignant neoplasm of bronchus and lung: Secondary | ICD-10-CM | POA: Insufficient documentation

## 2016-01-27 DIAGNOSIS — K5900 Constipation, unspecified: Secondary | ICD-10-CM | POA: Insufficient documentation

## 2016-01-27 DIAGNOSIS — Z79899 Other long term (current) drug therapy: Secondary | ICD-10-CM | POA: Insufficient documentation

## 2016-01-27 DIAGNOSIS — G8928 Other chronic postprocedural pain: Secondary | ICD-10-CM

## 2016-01-27 DIAGNOSIS — Z7689 Persons encountering health services in other specified circumstances: Secondary | ICD-10-CM | POA: Diagnosis not present

## 2016-01-27 DIAGNOSIS — F17210 Nicotine dependence, cigarettes, uncomplicated: Secondary | ICD-10-CM | POA: Insufficient documentation

## 2016-01-27 DIAGNOSIS — R634 Abnormal weight loss: Secondary | ICD-10-CM | POA: Insufficient documentation

## 2016-01-27 DIAGNOSIS — C50911 Malignant neoplasm of unspecified site of right female breast: Secondary | ICD-10-CM

## 2016-01-27 LAB — CBC WITH DIFFERENTIAL/PLATELET
BASOS ABS: 0 10*3/uL (ref 0–0.1)
Basophils Relative: 0 %
Eosinophils Absolute: 0.1 10*3/uL (ref 0–0.7)
Eosinophils Relative: 2 %
HEMATOCRIT: 32.2 % — AB (ref 35.0–47.0)
Hemoglobin: 10.7 g/dL — ABNORMAL LOW (ref 12.0–16.0)
LYMPHS ABS: 1.2 10*3/uL (ref 1.0–3.6)
MCH: 31.4 pg (ref 26.0–34.0)
MCHC: 33.3 g/dL (ref 32.0–36.0)
MCV: 94.1 fL (ref 80.0–100.0)
MONO ABS: 0.2 10*3/uL (ref 0.2–0.9)
Monocytes Relative: 9 %
NEUTROS ABS: 1.4 10*3/uL (ref 1.4–6.5)
Neutrophils Relative %: 48 %
Platelets: 120 10*3/uL — ABNORMAL LOW (ref 150–440)
RBC: 3.42 MIL/uL — AB (ref 3.80–5.20)
RDW: 25.4 % — ABNORMAL HIGH (ref 11.5–14.5)
WBC: 2.9 10*3/uL — AB (ref 3.6–11.0)

## 2016-01-27 MED ORDER — DOXORUBICIN HCL CHEMO IV INJECTION 2 MG/ML
60.0000 mg/m2 | Freq: Once | INTRAVENOUS | Status: AC
  Administered 2016-01-27: 122 mg via INTRAVENOUS
  Filled 2016-01-27: qty 61

## 2016-01-27 MED ORDER — SODIUM CHLORIDE 0.9% FLUSH
10.0000 mL | INTRAVENOUS | Status: DC | PRN
  Administered 2016-01-27: 10 mL via INTRAVENOUS
  Filled 2016-01-27: qty 10

## 2016-01-27 MED ORDER — SODIUM CHLORIDE 0.9 % IV SOLN
Freq: Once | INTRAVENOUS | Status: AC
  Administered 2016-01-27: 10:00:00 via INTRAVENOUS
  Filled 2016-01-27: qty 5

## 2016-01-27 MED ORDER — SODIUM CHLORIDE 0.9 % IV SOLN
600.0000 mg/m2 | Freq: Once | INTRAVENOUS | Status: AC
  Administered 2016-01-27: 1220 mg via INTRAVENOUS
  Filled 2016-01-27: qty 61

## 2016-01-27 MED ORDER — SODIUM CHLORIDE 0.9 % IV SOLN
Freq: Once | INTRAVENOUS | Status: AC
Start: 2016-01-27 — End: 2016-01-27
  Administered 2016-01-27: 10:00:00 via INTRAVENOUS
  Filled 2016-01-27: qty 1000

## 2016-01-27 MED ORDER — PALONOSETRON HCL INJECTION 0.25 MG/5ML
0.2500 mg | Freq: Once | INTRAVENOUS | Status: AC
  Administered 2016-01-27: 0.25 mg via INTRAVENOUS
  Filled 2016-01-27: qty 5

## 2016-01-27 MED ORDER — HYDROCODONE-ACETAMINOPHEN 5-325 MG PO TABS: 1.0000 | ORAL_TABLET | Freq: Two times a day (BID) | ORAL | Status: DC | PRN

## 2016-01-27 MED ORDER — HEPARIN SOD (PORK) LOCK FLUSH 100 UNIT/ML IV SOLN
500.0000 [IU] | Freq: Once | INTRAVENOUS | Status: AC
  Administered 2016-01-27: 500 [IU] via INTRAVENOUS
  Filled 2016-01-27: qty 5

## 2016-01-27 NOTE — Progress Notes (Signed)
Spoke with patient about labs, chemotherapy, port pain, medications, side effects, and nutrition.  Patient's port flushed after lifting left arm.  Patient complained of pain with flush.  Great blood return noted and lab specimen obtained and sent to lab.

## 2016-01-27 NOTE — Progress Notes (Signed)
Pt requested rf on norco.  RN spoke with [name]. MD will provide RF to patient.

## 2016-01-29 ENCOUNTER — Emergency Department
Admission: EM | Admit: 2016-01-29 | Discharge: 2016-01-29 | Disposition: A | Discharge status: Home / Self Care | Payer: Medicare Other | Attending: Emergency Medicine | Admitting: Emergency Medicine

## 2016-01-29 ENCOUNTER — Emergency Department: Payer: Medicare Other

## 2016-01-29 ENCOUNTER — Telehealth: Payer: Self-pay | Admitting: *Deleted

## 2016-01-29 ENCOUNTER — Other Ambulatory Visit: Payer: Self-pay

## 2016-01-29 DIAGNOSIS — Z7984 Long term (current) use of oral hypoglycemic drugs: Secondary | ICD-10-CM | POA: Diagnosis not present

## 2016-01-29 DIAGNOSIS — I10 Essential (primary) hypertension: Secondary | ICD-10-CM | POA: Diagnosis not present

## 2016-01-29 DIAGNOSIS — Z85118 Personal history of other malignant neoplasm of bronchus and lung: Secondary | ICD-10-CM | POA: Insufficient documentation

## 2016-01-29 DIAGNOSIS — I2510 Atherosclerotic heart disease of native coronary artery without angina pectoris: Secondary | ICD-10-CM | POA: Insufficient documentation

## 2016-01-29 DIAGNOSIS — R0602 Shortness of breath: Secondary | ICD-10-CM

## 2016-01-29 DIAGNOSIS — E785 Hyperlipidemia, unspecified: Secondary | ICD-10-CM | POA: Diagnosis not present

## 2016-01-29 DIAGNOSIS — Z853 Personal history of malignant neoplasm of breast: Secondary | ICD-10-CM | POA: Diagnosis not present

## 2016-01-29 DIAGNOSIS — Z791 Long term (current) use of non-steroidal anti-inflammatories (NSAID): Secondary | ICD-10-CM | POA: Diagnosis not present

## 2016-01-29 DIAGNOSIS — R531 Weakness: Secondary | ICD-10-CM | POA: Diagnosis not present

## 2016-01-29 DIAGNOSIS — F17210 Nicotine dependence, cigarettes, uncomplicated: Secondary | ICD-10-CM | POA: Diagnosis not present

## 2016-01-29 DIAGNOSIS — Z79899 Other long term (current) drug therapy: Secondary | ICD-10-CM | POA: Insufficient documentation

## 2016-01-29 DIAGNOSIS — Z8679 Personal history of other diseases of the circulatory system: Secondary | ICD-10-CM | POA: Insufficient documentation

## 2016-01-29 DIAGNOSIS — I252 Old myocardial infarction: Secondary | ICD-10-CM | POA: Insufficient documentation

## 2016-01-29 DIAGNOSIS — Z8669 Personal history of other diseases of the nervous system and sense organs: Secondary | ICD-10-CM | POA: Diagnosis not present

## 2016-01-29 DIAGNOSIS — Z7982 Long term (current) use of aspirin: Secondary | ICD-10-CM | POA: Diagnosis not present

## 2016-01-29 LAB — PROTIME-INR
INR: 0.97
Prothrombin Time: 13.1 seconds (ref 11.4–15.0)

## 2016-01-29 LAB — CBC
HCT: 32.8 % — ABNORMAL LOW (ref 35.0–47.0)
Hemoglobin: 11.1 g/dL — ABNORMAL LOW (ref 12.0–16.0)
MCH: 31.4 pg (ref 26.0–34.0)
MCHC: 33.7 g/dL (ref 32.0–36.0)
MCV: 93.2 fL (ref 80.0–100.0)
PLATELETS: 166 10*3/uL (ref 150–440)
RBC: 3.52 MIL/uL — AB (ref 3.80–5.20)
RDW: 25.8 % — AB (ref 11.5–14.5)
WBC: 2.5 10*3/uL — AB (ref 3.6–11.0)

## 2016-01-29 LAB — BASIC METABOLIC PANEL
ANION GAP: 9 (ref 5–15)
BUN: 27 mg/dL — ABNORMAL HIGH (ref 6–20)
CALCIUM: 9 mg/dL (ref 8.9–10.3)
CO2: 26 mmol/L (ref 22–32)
CREATININE: 0.99 mg/dL (ref 0.44–1.00)
Chloride: 99 mmol/L — ABNORMAL LOW (ref 101–111)
GFR calc non Af Amer: 59 mL/min — ABNORMAL LOW (ref >60)
Glucose, Bld: 194 mg/dL — ABNORMAL HIGH (ref 65–99)
Potassium: 4.3 mmol/L (ref 3.5–5.1)
SODIUM: 134 mmol/L — AB (ref 135–145)

## 2016-01-29 LAB — URINALYSIS COMPLETE WITH MICROSCOPIC (ARMC ONLY)
Bilirubin Urine: NEGATIVE
Glucose, UA: >500 mg/dL — AB
KETONES UR: NEGATIVE mg/dL
Leukocytes, UA: NEGATIVE
Nitrite: NEGATIVE
PH: 6 (ref 5.0–8.0)
PROTEIN: NEGATIVE mg/dL
Specific Gravity, Urine: 1.005 (ref 1.005–1.030)
WBC UA: NONE SEEN WBC/hpf (ref 0–5)

## 2016-01-29 LAB — TSH: TSH: 0.454 u[IU]/mL (ref 0.350–4.500)

## 2016-01-29 LAB — TROPONIN I: Troponin I: <0.03 ng/mL (ref <0.031)

## 2016-01-29 MED ORDER — DICYCLOMINE HCL 20 MG PO TABS: 20.0000 mg | ORAL_TABLET | Freq: Three times a day (TID) | ORAL | Status: DC | PRN

## 2016-01-29 NOTE — ED Notes (Signed)
MD at bedside. 

## 2016-01-29 NOTE — ED Notes (Addendum)
Pt reports breast CA, reports last treatment was Tuesday. Pt reports shortness of breath, heart palpitations. Pt also reports dizziness. Pt was on xarelto up until 3 months ago, now on eliquis.

## 2016-01-29 NOTE — Telephone Encounter (Signed)
Patient reports dizziness and sob, after discussion with oncall physician and Dr. Aletha Halim nurse, patient instructed to go to the ER for evaluation. Patient services navigator arranging for cab ride to and from ED related to patient refusal to use EMS for transportation. All [facility] navigators involved in patient case.

## 2016-01-29 NOTE — ED Provider Notes (Signed)
[facility] Emergency Department Provider Note   ____________________________________________  Time seen: Approximately 6:15 PM  I have reviewed the triage vital signs and the nursing notes.   HISTORY  Chief Complaint Shortness of Breath   HPI Dawn Allen is a 65 y.o. female with a history of breast cancer as well as DVT on eliquis to his presenting to the emergency department today with shortness of breath since this past Tuesday. She said that she had a new chemotherapy regimen this past Tuesday and then several hours afterward began feeling shortness of breath with exertion. She denies a cough or fever. She denies any pain. Says that she is compliant with her medications. Says that when sitting she feels fine but whenever she gets up and ambulates she feels weak and occasionally lightheaded. She also felt palpitations earlier today.Denies any palpitations at this time.   Past Medical History  Diagnosis Date  . Hypertension   . DVT (deep venous thrombosis) (HCC)     LEFT LEG   . MI (myocardial infarction) ([place])   . Diabetes mellitus without complication ([place])   . Seizure disorder ([place])   . Coronary artery disease     a. NSTEMI cath 08/02/14: LM nl, pLAD 50%, mLCx 30%, mRCA 95% s/p PCI/DES, 2nd lesion 40%, EF 55%  . HLD (hyperlipidemia)   . Lung nodule   . Anxiety   . Seizures ([place])   . 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 Cancer of right female breast ([place])   . Breast cancer ([place]) 08/2015    right breast, chemo    Patient Active Problem List   Diagnosis Date Noted  . Breast cancer ([place]) 10/06/2015  . Mass of right breast 08/14/2015  . Thyroid nodule 04/29/2015  . Squamous cell lung cancer ([place]) 04/23/2015  . Hypokalemia 03/02/2015  . Leukocytosis 03/02/2015  . Generalized weakness 03/02/2015  . Lung nodule 03/02/2015  . Pain in a tooth or teeth 03/02/2015  . Sepsis ([place]) 03/02/2015  . Lung mass 03/02/2015  . SIRS (systemic  inflammatory response syndrome) ([place]) 02/27/2015  . Coronary artery disease   . Hypertension   . DVT (deep venous thrombosis) ([place])   . HLD (hyperlipidemia)   . Achilles bursitis or tendinitis 05/31/2013  . 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 Other hammer toe (acquired) 05/31/2013  . 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 Abdominal hysterectomy      PARTIAL   . Leg surgery Right     BLOOD CLOT  . Cardiac catheterization  08/02/2014  . Coronary angioplasty  08/02/2014    drug eluting stent placement  . Ivc filter placement ([facility] hx)    . Portacath placement Left 10/13/2015    Procedure: INSERTION PORT-A-CATH;  Surgeon: Hubbard Robinson, MD;  Location: [facility] ORS;  Service: General;  Laterality: Left;  . Breast biopsy Right 09/15/2015    positve    Current Outpatient Rx  Name  Route  Sig  Dispense  Refill  . apixaban (ELIQUIS) 2.5 MG TABS tablet   Oral   Take 2.5 mg by mouth 2 (two) times daily. Reported on 01/01/2016         . Aspirin-Salicylamide-Caffeine (BC HEADACHE POWDER PO)   Oral   Take 1 packet by mouth daily.         Marland Kitchen atorvastatin (LIPITOR) 40 MG tablet   Oral   Take 1 tablet by mouth 1 day or  1 dose. Reported on 12/16/2015         . diazepam (VALIUM) 5 MG tablet   Oral   Take 1 tablet by mouth 2 (two) times daily as needed. Reported on 12/16/2015         . empagliflozin (JARDIANCE) 25 MG TABS tablet   Oral   Take 25 mg by mouth daily.         Marland Kitchen gabapentin (NEURONTIN) 300 MG capsule   Oral   Take 1 capsule (300 mg total) by mouth 3 (three) times daily.   90 capsule   0   . HYDROcodone-acetaminophen (NORCO/VICODIN) 5-325 MG tablet   Oral   Take 1 tablet by mouth every 12 (twelve) hours as needed. For pain   60 tablet   0   . lidocaine-prilocaine (EMLA) cream   Topical   Apply 1 application topically as needed. Apply to port a cath site 1 hour before  chemotherapy treatment.   30 g   6   . LINZESS 145 MCG CAPS capsule      Reported on 01/20/2016           Dispense as written.   Marland Kitchen lisinopril-hydrochlorothiazide (PRINZIDE,ZESTORETIC) 20-25 MG per tablet   Oral   Take 1 tablet by mouth daily.          . metFORMIN (GLUCOPHAGE) 1000 MG tablet   Oral   Take 1 tablet by mouth 2 (two) times daily. Reported on 10/08/2015         . naproxen (NAPROSYN) 500 MG tablet   Oral   Take 1 tablet by mouth 2 (two) times daily. Reported on 11/03/2015         . nitroGLYCERIN (NITROSTAT) 0.4 MG SL tablet   Sublingual   Place 0.4 mg under the tongue every 5 (five) minutes as needed for chest pain. Reported on 12/16/2015         . ondansetron (ZOFRAN) 8 MG tablet   Oral   Take 1 tablet (8 mg total) by mouth every 8 (eight) hours as needed for nausea or vomiting (start 3 days; after chemo).   40 tablet   0   . phenytoin (DILANTIN) 100 MG ER capsule   Oral   Take 300 mg by mouth daily.          . pioglitazone (ACTOS) 45 MG tablet   Oral   Take 1 tablet by mouth daily.         . prochlorperazine (COMPAZINE) 10 MG tablet   Oral   Take 1 tablet (10 mg total) by mouth every 6 (six) hours as needed for nausea or vomiting.   40 tablet   0   . VENTOLIN HFA 108 (90 Base) MCG/ACT inhaler      Reported on 01/20/2016           Dispense as written.     Allergies Penicillins  Family History  Problem Relation Age of Onset  . Heart attack Mother   . Breast cancer Neg Hx     Social History Social History  Substance Use Topics  . Smoking status: Current Every Day Smoker -- 0.50 packs/day for 45 years    Types: Cigarettes  . Smokeless tobacco: Never Used  . Alcohol Use: No    Review of Systems Constitutional: No fever/chills Eyes: No visual changes. ENT: No sore throat. Cardiovascular: Denies chest pain. Respiratory: With exertion as above Gastrointestinal: No abdominal pain.  No nausea, no vomiting.  No diarrhea.  No  constipation. Genitourinary: Negative for dysuria. Musculoskeletal: Negative for back pain. Skin: Negative for rash. Neurological: Negative for headaches, focal weakness or numbness.  10-point ROS otherwise negative.  ____________________________________________   PHYSICAL EXAM:  VITAL SIGNS: ED Triage Vitals  Enc Vitals Group     BP 01/29/16 1648 133/79 mmHg     Pulse Rate 01/29/16 1648 81     Resp 01/29/16 1648 18     Temp 01/29/16 1648 98.1 F (36.7 C)     Temp Source 01/29/16 1648 Oral     SpO2 01/29/16 1648 100 %     Weight 01/29/16 1648 196 lb (88.905 kg)     Height 01/29/16 1648 '5\' 5"'$  (1.651 m)     Head Cir --      Peak Flow --      Pain Score 01/29/16 1657 2     Pain Loc --      Pain Edu? --      Excl. in [place]? --     Constitutional: Alert and oriented. Well appearing and in no acute distress. Eyes: Conjunctivae are normal. PERRL. EOMI. Head: Atraumatic. Nose: No congestion/rhinnorhea. Mouth/Throat: Mucous membranes are moist.   Neck: No stridor.   Cardiovascular: Normal rate, regular rhythm. Grossly normal heart sounds.   Respiratory: Normal respiratory effort.  No retractions. Lungs CTAB. Gastrointestinal: Soft and nontender. No distention. No abdominal bruits. No CVA tenderness. Musculoskeletal: No lower extremity tenderness nor edema.  No joint effusions. Neurologic:  Normal speech and language. No gross focal neurologic deficits are appreciated.  Skin:  Skin is warm, dry and intact. No rash noted. Psychiatric: Mood and affect are normal. Speech and behavior are normal.  ____________________________________________   LABS (all labs ordered are listed, but only abnormal results are displayed)  Labs Reviewed  BASIC METABOLIC PANEL - Abnormal; Notable for the following:    Sodium 134 (*)    Chloride 99 (*)    Glucose, Bld 194 (*)    BUN 27 (*)    GFR calc non Af Amer 59 (*)    All other components within normal limits  CBC - Abnormal; Notable for the  following:    WBC 2.5 (*)    RBC 3.52 (*)    Hemoglobin 11.1 (*)    HCT 32.8 (*)    RDW 25.8 (*)    All other components within normal limits  URINALYSIS COMPLETEWITH MICROSCOPIC ([facility] ONLY) - Abnormal; Notable for the following:    Color, Urine STRAW (*)    APPearance CLEAR (*)    Glucose, UA >500 (*)    Hgb urine dipstick 1+ (*)    Bacteria, UA RARE (*)    Squamous Epithelial / LPF 0-5 (*)    All other components within normal limits  TROPONIN I  PROTIME-INR  TSH   ____________________________________________  EKG  ED ECG REPORT I, Doran Stabler, the attending physician, personally viewed and interpreted this ECG.   Date: 01/29/2016  EKG Time: 1706  Rate: 86  Rhythm: normal sinus rhythm  Axis: Normal axis  Intervals:none  ST&T Change: No ST segment elevation or depression. No abnormal T-wave inversion.  ____________________________________________  RADIOLOGY  G Chest 2 View (Final result) Result time: 01/29/16 17:30:09   Final result by Rad Results In Interface (01/29/16 17:30:09)   Narrative:   CLINICAL DATA: Breast cancer, shortness of breath, heart palpitations, dizziness.  EXAM: CHEST 2 VIEW  COMPARISON: Chest x-rays dated 12/20/2015 and 04/23/2015.  FINDINGS: Heart size is normal. Overall cardiomediastinal silhouette  is stable in size and configuration. Left chest wall Port-A-Cath remains well positioned with tip at the level of the right atrium.  Lungs are clear. No evidence of pneumonia. No pleural effusion or pneumothorax seen.  Degenerative spurring again noted within the slightly kyphotic and scoliotic thoracolumbar spine. Osseous structures about the chest are otherwise unremarkable. IVC filter again appreciated within the upper abdomen, stable in position.  IMPRESSION: Stable chest x-ray. Lungs are clear and there is no evidence of acute cardiopulmonary abnormality.   Electronically Signed By: Franki Cabot M.D. On:  01/29/2016 17:30    ____________________________________________   PROCEDURES  ____________________________________________   INITIAL IMPRESSION / ASSESSMENT AND PLAN / ED COURSE  Pertinent labs & imaging results that were available during my care of the patient were reviewed by me and considered in my medical decision making (see chart for details).  Offered to give patient's fluid through her port. Nursing was unable to thread an IV catheter upon presentation. Patient says that she would rather take by mouth and an increased rate at home.  ----------------------------------------- 8:45 PM on 01/29/2016 -----------------------------------------  Patient with very short reassuring lab workup. No signs of CHF. Very unlikely to be pulmonary embolus. No chest pain. No hypoxia. Patient able to walk at this time without any shortness of breath. Occurs the patient to drink plenty of fluids at home. Will be following up with collagen early next week. Likely effects of recent new chemotherapy. ____________________________________________   FINAL CLINICAL IMPRESSION(S) / ED DIAGNOSES  Weakness. Shortness of breath.     NEW MEDICATIONS STARTED DURING THIS VISIT:  New Prescriptions   No medications on file     Note:  This document was prepared using Dragon voice recognition software and may include unintentional dictation errors.    Orbie Pyo, MD 01/29/16 2046

## 2016-01-30 ENCOUNTER — Ambulatory Visit: Payer: Medicare Other | Admitting: Oncology

## 2016-02-06 ENCOUNTER — Telehealth: Payer: Self-pay

## 2016-02-06 NOTE — Telephone Encounter (Signed)
  Oncology Nurse Navigator Documentation  Navigator Location: [facility] (02/06/16 1400) Navigator Encounter Type: Telephone (02/06/16 1400)                                          Time Spent with Patient: 30 (02/06/16 1400)   Received call from patient asking if it is time she can get her hydrocodone refilled. Asked to call pharmacy for her and see if its time. She had contacted them and was confused regarding which medicine was due and argued with them. [place] and she has script on hold and it is within time frame that she can get refilled. She was notified. She feels like she is more confused and SOB on minimal exertion. The only time she is not SOB is when she is resting and sitting. She has her brother staying with her at night. Reports two recent trips to the ED with difficulty breathing. First time stated she was dehydrated. She refused admission for IVF as recommended and second trip she states they were concerned about heart stent. Do not see documentation regarding second trip to ED. Next treatment due in appx 2 weeks, and she states she wants to talk with her physician regarding new treatment.

## 2016-02-11 ENCOUNTER — Telehealth: Payer: Self-pay | Admitting: *Deleted

## 2016-02-11 ENCOUNTER — Encounter: Payer: Self-pay | Admitting: *Deleted

## 2016-02-11 DIAGNOSIS — C50911 Malignant neoplasm of unspecified site of right female breast: Secondary | ICD-10-CM

## 2016-02-11 NOTE — Progress Notes (Signed)
  Oncology Nurse Navigator Documentation  Navigator Location: [facility] (02/11/16 1600) Navigator Encounter Type: Telephone (02/11/16 1600) Telephone: Incoming Call;Outgoing Call (02/11/16 1600)         Patient Visit Type: Other (02/11/16 1600)   Barriers/Navigation Needs: Coordination of Care (02/11/16 1600)   Interventions: Coordination of Care (02/11/16 1600)                      Time Spent with Patient: 60 (02/11/16 1600)   I have talked to the patient numerous times today in regards to her toothache.  States "I am eating BC's".  Rates her pain a 10 on a 0/10 scale.  States the hydrocodone does not work as well as the [employer].  Has taken 6 BC's since last night.  Encouraged not to take any more BC's.  Reviewed risk of bleeding, and too much caffeine and Tylenol.  Discussed with Nada Boozer, RN, Dr. Aletha Halim nurse.  Reviewed note from Renita Papa, RN with recommendation to have the patient come in for CBC prior to deciding if she could have the tooth extracted.  Scheduled patient to come tomorrow at 11:00 via the van.  Could not reach patient via phone, but left her a message on her mobile and home phone with time of appointment.

## 2016-02-11 NOTE — Telephone Encounter (Signed)
md would like pt to come to clinic for lab only cbc.  If counts are stable, then md may consider tooth extraction.

## 2016-02-11 NOTE — Telephone Encounter (Signed)
Patient has a toothache.  Wants to know if she can have tooth extracted?  If not what can be done to control pain?  Patient states she is eating BC powders.

## 2016-02-12 ENCOUNTER — Inpatient Hospital Stay: Payer: Medicare Other

## 2016-02-12 ENCOUNTER — Telehealth: Payer: Self-pay | Admitting: *Deleted

## 2016-02-12 NOTE — Telephone Encounter (Signed)
-----   Message from Cephus Richer sent at 02/12/2016  9:47 AM EDT ----- Per pt will wait until Tuesday to get labs. Will not get tooth pull.

## 2016-02-12 NOTE — Telephone Encounter (Signed)
Pt called for an apt for lab only. Pt refused this appointment. Explained that she had another provider appointment today and this conflicted with that appointment. Pt declined to r/s this apt. Stating that she will not get tooth pulled at this time and will wait until she has labs drawn on Tuesday.  MD made aware.

## 2016-02-13 ENCOUNTER — Telehealth: Payer: Self-pay

## 2016-02-13 NOTE — Telephone Encounter (Signed)
  Oncology Nurse Navigator Documentation  Navigator Location: [facility] (02/13/16 1300) Navigator Encounter Type: Telephone (02/13/16 1300) Telephone: Symptom Mgt (02/13/16 1300)             Barriers/Navigation Needs: Education;Coordination of Care (02/13/16 1300) Education: Pain/ Symptom Management (02/13/16 1300) Interventions: Coordination of Care;Education Method (02/13/16 1300)     Education Method: Verbal (02/13/16 1300)                Time Spent with Patient: 30 (02/13/16 1300)   Received call from Dawn Allen stating that her tooth hurt so bad that she was going to have to go against Dr Aletha Halim orders and get it taken care of. She was offered again to come get labs drawn. Stated she did not have dentist appointment until 6-29 and she sees Dr B before that on 6-27. Labs will be done then. Continues to pack the tooth area with BC powder. Discouraged this. May find some relief from using cloves. States she may go to the ED this evening if she can no longer tolerate the pain. Does report pus coming from under the tooth when she coughs.

## 2016-02-17 ENCOUNTER — Inpatient Hospital Stay (HOSPITAL_BASED_OUTPATIENT_CLINIC_OR_DEPARTMENT_OTHER): Payer: Medicare Other | Admitting: Internal Medicine

## 2016-02-17 ENCOUNTER — Inpatient Hospital Stay: Payer: Medicare Other

## 2016-02-17 ENCOUNTER — Encounter: Payer: Self-pay | Admitting: Internal Medicine

## 2016-02-17 VITALS — BP 147/88 | HR 88 | Temp 97.1°F | Resp 18 | Wt 188.1 lb

## 2016-02-17 VITALS — BP 130/76 | HR 82 | Temp 97.4°F | Resp 18

## 2016-02-17 DIAGNOSIS — C50911 Malignant neoplasm of unspecified site of right female breast: Secondary | ICD-10-CM

## 2016-02-17 DIAGNOSIS — Z85118 Personal history of other malignant neoplasm of bronchus and lung: Secondary | ICD-10-CM | POA: Diagnosis not present

## 2016-02-17 DIAGNOSIS — T451X5A Adverse effect of antineoplastic and immunosuppressive drugs, initial encounter: Secondary | ICD-10-CM

## 2016-02-17 DIAGNOSIS — Z7901 Long term (current) use of anticoagulants: Secondary | ICD-10-CM

## 2016-02-17 DIAGNOSIS — F17210 Nicotine dependence, cigarettes, uncomplicated: Secondary | ICD-10-CM

## 2016-02-17 DIAGNOSIS — E785 Hyperlipidemia, unspecified: Secondary | ICD-10-CM

## 2016-02-17 DIAGNOSIS — R630 Anorexia: Secondary | ICD-10-CM

## 2016-02-17 DIAGNOSIS — R634 Abnormal weight loss: Secondary | ICD-10-CM | POA: Diagnosis not present

## 2016-02-17 DIAGNOSIS — K5900 Constipation, unspecified: Secondary | ICD-10-CM

## 2016-02-17 DIAGNOSIS — I10 Essential (primary) hypertension: Secondary | ICD-10-CM

## 2016-02-17 DIAGNOSIS — C50811 Malignant neoplasm of overlapping sites of right female breast: Secondary | ICD-10-CM | POA: Diagnosis not present

## 2016-02-17 DIAGNOSIS — Z79899 Other long term (current) drug therapy: Secondary | ICD-10-CM

## 2016-02-17 DIAGNOSIS — Z5111 Encounter for antineoplastic chemotherapy: Secondary | ICD-10-CM | POA: Diagnosis not present

## 2016-02-17 DIAGNOSIS — Z7984 Long term (current) use of oral hypoglycemic drugs: Secondary | ICD-10-CM

## 2016-02-17 DIAGNOSIS — I252 Old myocardial infarction: Secondary | ICD-10-CM

## 2016-02-17 DIAGNOSIS — G8928 Other chronic postprocedural pain: Secondary | ICD-10-CM

## 2016-02-17 DIAGNOSIS — Z171 Estrogen receptor negative status [ER-]: Secondary | ICD-10-CM | POA: Diagnosis not present

## 2016-02-17 DIAGNOSIS — R112 Nausea with vomiting, unspecified: Secondary | ICD-10-CM

## 2016-02-17 DIAGNOSIS — Z7982 Long term (current) use of aspirin: Secondary | ICD-10-CM

## 2016-02-17 DIAGNOSIS — K0889 Other specified disorders of teeth and supporting structures: Secondary | ICD-10-CM

## 2016-02-17 DIAGNOSIS — Z86718 Personal history of other venous thrombosis and embolism: Secondary | ICD-10-CM

## 2016-02-17 LAB — CBC WITH DIFFERENTIAL/PLATELET
BASOS ABS: 0.1 10*3/uL (ref 0–0.1)
BASOS PCT: 2 %
Eosinophils Absolute: 0 10*3/uL (ref 0–0.7)
Eosinophils Relative: 1 %
HCT: 32.8 % — ABNORMAL LOW (ref 35.0–47.0)
Hemoglobin: 11 g/dL — ABNORMAL LOW (ref 12.0–16.0)
LYMPHS ABS: 1.9 10*3/uL (ref 1.0–3.6)
Lymphocytes Relative: 26 %
MCH: 31.6 pg (ref 26.0–34.0)
MCHC: 33.4 g/dL (ref 32.0–36.0)
MCV: 94.6 fL (ref 80.0–100.0)
MONO ABS: 0.9 10*3/uL (ref 0.2–0.9)
MONOS PCT: 12 %
NEUTROS ABS: 4.4 10*3/uL (ref 1.4–6.5)
NEUTROS PCT: 59 %
PLATELETS: 273 10*3/uL (ref 150–440)
RBC: 3.46 MIL/uL — ABNORMAL LOW (ref 3.80–5.20)
RDW: 22.4 % — AB (ref 11.5–14.5)
WBC: 7.3 10*3/uL (ref 3.6–11.0)

## 2016-02-17 LAB — COMPREHENSIVE METABOLIC PANEL
ALT: 16 U/L (ref 14–54)
ANION GAP: 9 (ref 5–15)
AST: 15 U/L (ref 15–41)
Albumin: 3.2 g/dL — ABNORMAL LOW (ref 3.5–5.0)
Alkaline Phosphatase: 217 U/L — ABNORMAL HIGH (ref 38–126)
BILIRUBIN TOTAL: 0.2 mg/dL — AB (ref 0.3–1.2)
BUN: 20 mg/dL (ref 6–20)
CHLORIDE: 99 mmol/L — AB (ref 101–111)
CO2: 28 mmol/L (ref 22–32)
Calcium: 8.7 mg/dL — ABNORMAL LOW (ref 8.9–10.3)
Creatinine, Ser: 0.75 mg/dL (ref 0.44–1.00)
GFR calc Af Amer: >60 mL/min (ref >60)
Glucose, Bld: 186 mg/dL — ABNORMAL HIGH (ref 65–99)
POTASSIUM: 3.1 mmol/L — AB (ref 3.5–5.1)
Sodium: 136 mmol/L (ref 135–145)
TOTAL PROTEIN: 7.4 g/dL (ref 6.5–8.1)

## 2016-02-17 MED ORDER — CYCLOPHOSPHAMIDE CHEMO INJECTION 1 GM
600.0000 mg/m2 | Freq: Once | INTRAMUSCULAR | Status: AC
  Administered 2016-02-17: 1220 mg via INTRAVENOUS
  Filled 2016-02-17: qty 61

## 2016-02-17 MED ORDER — DOXORUBICIN HCL CHEMO IV INJECTION 2 MG/ML
60.0000 mg/m2 | Freq: Once | INTRAVENOUS | Status: AC
  Administered 2016-02-17: 122 mg via INTRAVENOUS
  Filled 2016-02-17: qty 61

## 2016-02-17 MED ORDER — POTASSIUM CHLORIDE CRYS ER 20 MEQ PO TBCR: 40.0000 meq | EXTENDED_RELEASE_TABLET | Freq: Two times a day (BID) | ORAL | Status: DC

## 2016-02-17 MED ORDER — SODIUM CHLORIDE 0.9 % IV SOLN
Freq: Once | INTRAVENOUS | Status: AC
  Administered 2016-02-17: 11:00:00 via INTRAVENOUS
  Filled 2016-02-17: qty 5

## 2016-02-17 MED ORDER — CLINDAMYCIN HCL 300 MG PO CAPS: 300.0000 mg | ORAL_CAPSULE | Freq: Three times a day (TID) | ORAL | Status: DC

## 2016-02-17 MED ORDER — SODIUM CHLORIDE 0.9 % IV SOLN
Freq: Once | INTRAVENOUS | Status: AC
  Administered 2016-02-17: 11:00:00 via INTRAVENOUS
  Filled 2016-02-17: qty 1000

## 2016-02-17 MED ORDER — PALONOSETRON HCL INJECTION 0.25 MG/5ML
0.2500 mg | Freq: Once | INTRAVENOUS | Status: AC
  Administered 2016-02-17: 0.25 mg via INTRAVENOUS
  Filled 2016-02-17: qty 5

## 2016-02-17 MED ORDER — HYDROCODONE-ACETAMINOPHEN 5-325 MG PO TABS: 1.0000 | ORAL_TABLET | Freq: Two times a day (BID) | ORAL | Status: DC | PRN

## 2016-02-17 MED ORDER — SODIUM CHLORIDE 0.9% FLUSH
10.0000 mL | INTRAVENOUS | Status: DC | PRN
  Administered 2016-02-17: 10 mL
  Filled 2016-02-17: qty 10

## 2016-02-17 MED ORDER — HEPARIN SOD (PORK) LOCK FLUSH 100 UNIT/ML IV SOLN
500.0000 [IU] | Freq: Once | INTRAVENOUS | Status: AC | PRN
  Administered 2016-02-17: 500 [IU]
  Filled 2016-02-17: qty 5

## 2016-02-17 MED ORDER — PEGFILGRASTIM 6 MG/0.6ML ~~LOC~~ PSKT
6.0000 mg | PREFILLED_SYRINGE | Freq: Once | SUBCUTANEOUS | Status: AC
  Administered 2016-02-17: 6 mg via SUBCUTANEOUS
  Filled 2016-02-17: qty 0.6

## 2016-02-17 NOTE — Progress Notes (Signed)
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   # Smoker; ? Paranoia; Feb 2017- MUGA scan- 67%     Cancer of overlapping sites of right female breast ([facility]   02/17/2016 Initial Diagnosis Cancer of overlapping sites of right female breast ([facility]    INTERVAL HISTORY: Poor- vague historian  65year old female patient with above history of newly diagnosed breast cancer- triple negative; clinical stage II- currently on neo-adjuvant; status post weekly carbo-taxol x 12- is here for follow-up/ s/p cycle #1 of Adriamycin Cytoxan 3 weeks ago.  Patient in interim was seen in the emergency room for shortness of breath- workup was negative for any acute process.  Patient complains of left lower tooth pain- poor appetite. Positive for weight loss.  Patient had mild nausea with vomiting after chemotherapy. Currently resolved.  She continues to take hydrocodone one pill twice a day as needed.  Complains of constipation; on dulcolax. Otherwise patient denies any unusual shortness of breath or chest pain or cough.   REVIEW OF SYSTEMS: Difficult to assess given as patient is a poor historian.  PAST MEDICAL HISTORY :  Past Medical History  Diagnosis Date  . Hypertension   . DVT (deep venous thrombosis) (HCC)     LEFT LEG   . MI (myocardial infarction) ([facility]   . Diabetes mellitus without complication ([facility]   . Seizure disorder ([facility]   . Coronary artery disease     a. NSTEMI  cath 08/02/14: LM nl, pLAD 50%, mLCx 30%, mRCA 95% s/p PCI/DES, 2nd lesion 40%, EF 55%  . HLD (hyperlipidemia)   . Lung nodule   . Anxiety   . Seizures ([facility]   . Squamous cell lung cancer ([facility] 04/23/2015  . Cancer of right female breast ([facility]   . Breast cancer ([facility] 08/2015    right breast, chemo    PAST SURGICAL HISTORY :   Past Surgical History  Procedure Laterality Date  . Abdominal hysterectomy      PARTIAL   . Leg surgery Right     BLOOD CLOT  . Cardiac catheterization  08/02/2014  . Coronary angioplasty  08/02/2014    drug eluting stent placement  . Ivc filter placement ([facility] hx)    . Portacath placement Left 10/13/2015    Procedure: INSERTION PORT-A-CATH;  Surgeon: CHubbard Robinson MD;  Location: [facility] ORS;  Service: General;  Laterality: Left;  . Breast biopsy Right 09/15/2015    positve    FAMILY HISTORY :   Family History  Problem Relation Age of Onset  . Heart attack Mother   . Breast cancer Neg Hx     SOCIAL HISTORY:   Social History  Substance Use Topics  . Smoking status: Current Every Day Smoker -- 0.50 packs/day for 45 years    Types: Cigarettes  . Smokeless tobacco: Never Used  . Alcohol Use: No    ALLERGIES:  is allergic to penicillins.  MEDICATIONS:  Current  Outpatient Prescriptions  Medication Sig Dispense Refill  . apixaban (ELIQUIS) 2.5 MG TABS tablet Take 2.5 mg by mouth 2 (two) times daily. Reported on 01/01/2016    . Aspirin-Salicylamide-Caffeine (BC HEADACHE POWDER PO) Take 1 packet by mouth daily.    Marland Kitchen atorvastatin (LIPITOR) 40 MG tablet Take 1 tablet by mouth 1 day or 1 dose. Reported on 12/16/2015    . diazepam (VALIUM) 5 MG tablet Take 1 tablet by mouth 2 (two) times daily as needed. Reported on 12/16/2015    . empagliflozin (JARDIANCE) 25 MG TABS tablet Take 25 mg by mouth daily.    Marland Kitchen gabapentin (NEURONTIN) 300 MG capsule Take 1 capsule (300 mg total) by mouth 3 (three) times daily. 90 capsule 0  . HYDROcodone-acetaminophen  (NORCO/VICODIN) 5-325 MG tablet Take 1 tablet by mouth every 12 (twelve) hours as needed. For pain 60 tablet 0  . lidocaine-prilocaine (EMLA) cream Apply 1 application topically as needed. Apply to port a cath site 1 hour before chemotherapy treatment. 30 g 6  . LINZESS 145 MCG CAPS capsule Reported on 01/20/2016    . lisinopril-hydrochlorothiazide (PRINZIDE,ZESTORETIC) 20-25 MG per tablet Take 1 tablet by mouth daily.     . metFORMIN (GLUCOPHAGE) 1000 MG tablet Take 1 tablet by mouth 2 (two) times daily. Reported on 10/08/2015    . naproxen (NAPROSYN) 500 MG tablet Take 1 tablet by mouth 2 (two) times daily. Reported on 11/03/2015    . nitroGLYCERIN (NITROSTAT) 0.4 MG SL tablet Place 0.4 mg under the tongue every 5 (five) minutes as needed for chest pain. Reported on 12/16/2015    . ondansetron (ZOFRAN) 8 MG tablet Take 1 tablet (8 mg total) by mouth every 8 (eight) hours as needed for nausea or vomiting (start 3 days; after chemo). 40 tablet 0  . phenytoin (DILANTIN) 100 MG ER capsule Take 300 mg by mouth daily.     . pioglitazone (ACTOS) 45 MG tablet Take 1 tablet by mouth daily.    . prochlorperazine (COMPAZINE) 10 MG tablet Take 1 tablet (10 mg total) by mouth every 6 (six) hours as needed for nausea or vomiting. 40 tablet 0  . VENTOLIN HFA 108 (90 Base) MCG/ACT inhaler Reported on 01/20/2016     No current facility-administered medications for this visit.   Facility-Administered Medications Ordered in Other Visits  Medication Dose Route Frequency Provider Last Rate Last Dose  . albuterol (PROVENTIL) (2.5 MG/3ML) 0.083% nebulizer solution 2.5 mg  2.5 mg Nebulization Once Nestor Lewandowsky, MD      . sodium chloride flush (NS) 0.9 % injection 10 mL  10 mL Intravenous PRN Cammie Sickle, MD   10 mL at 12/01/15 0850    PHYSICAL EXAMINATION: ECOG PERFORMANCE STATUS: 0 - Asymptomatic  BP 147/88 mmHg  Pulse 88  Wt 188 lb 0.8 oz (85.3 kg)  PF 18 L/min  Filed Weights   02/17/16 1011  Weight:  188 lb 0.8 oz (85.3 kg)    GENERAL: Well-nourished well-developed; Alert, no distress and comfortable. Alone;  EYES: no pallor or icterus OROPHARYNX: no thrush or ulceration; right lower jaw molar- tenderness; no pus noted.  NECK: supple, no masses felt LYMPH: no palpable lymphadenopathy in the cervical, axillary or inguinal regions LUNGS: clear to auscultation and No wheeze or crackles HEART/CVS: regular rate & rhythm and no murmurs; No lower extremity edema ABDOMEN:abdomen soft, non-tender and normal bowel sounds Musculoskeletal:no cyanosis of digits and no clubbing  PSYCH: alert & oriented x 3 with fluent speech NEURO: no  focal motor/sensory deficits SKIN: no rashes or significant lesions.   LABORATORY DATA:  I have reviewed the data as listed    Component Value Date/Time   NA 136 02/17/2016 0941   NA 141 08/03/2014 0050   K 3.1* 02/17/2016 0941   K 3.3* 08/03/2014 0050   CL 99* 02/17/2016 0941   CL 108* 08/03/2014 0050   CO2 28 02/17/2016 0941   CO2 27 08/03/2014 0050   GLUCOSE 186* 02/17/2016 0941   GLUCOSE 164* 08/03/2014 0050   BUN 20 02/17/2016 0941   BUN 14 08/03/2014 0050   CREATININE 0.75 02/17/2016 0941   CREATININE 0.68 08/03/2014 0050   CALCIUM 8.7* 02/17/2016 0941   CALCIUM 8.2* 08/03/2014 0050   PROT 7.4 02/17/2016 0941   PROT 6.7 04/11/2013 2241   ALBUMIN 3.2* 02/17/2016 0941   ALBUMIN 2.7* 04/11/2013 2241   AST 15 02/17/2016 0941   AST 13* 04/11/2013 2241   ALT 16 02/17/2016 0941   ALT 16 04/11/2013 2241   ALKPHOS 217* 02/17/2016 0941   ALKPHOS 207* 04/11/2013 2241   BILITOT 0.2* 02/17/2016 0941   BILITOT 0.1* 04/11/2013 2241   GFRNONAA >60 02/17/2016 0941   GFRNONAA >60 08/03/2014 0050   GFRNONAA >60 04/11/2013 2241   GFRAA >60 02/17/2016 0941   GFRAA >60 08/03/2014 0050   GFRAA >60 04/11/2013 2241    No results found for: SPEP, UPEP  Lab Results  Component Value Date   WBC 7.3 02/17/2016   NEUTROABS 4.4 02/17/2016   HGB 11.0*  02/17/2016   HCT 32.8* 02/17/2016   MCV 94.6 02/17/2016   PLT 273 02/17/2016      Chemistry      Component Value Date/Time   NA 136 02/17/2016 0941   NA 141 08/03/2014 0050   K 3.1* 02/17/2016 0941   K 3.3* 08/03/2014 0050   CL 99* 02/17/2016 0941   CL 108* 08/03/2014 0050   CO2 28 02/17/2016 0941   CO2 27 08/03/2014 0050   BUN 20 02/17/2016 0941   BUN 14 08/03/2014 0050   CREATININE 0.75 02/17/2016 0941   CREATININE 0.68 08/03/2014 0050      Component Value Date/Time   CALCIUM 8.7* 02/17/2016 0941   CALCIUM 8.2* 08/03/2014 0050   ALKPHOS 217* 02/17/2016 0941   ALKPHOS 207* 04/11/2013 2241   AST 15 02/17/2016 0941   AST 13* 04/11/2013 2241   ALT 16 02/17/2016 0941   ALT 16 04/11/2013 2241   BILITOT 0.2* 02/17/2016 0941   BILITOT 0.1* 04/11/2013 2241    12/26/2015:   Ultrasound targeted to the right breast at 11 o'clock, 8 cm from the nipple demonstrates again demonstrates the irregular hypoechoic mass which measures approximately 2.2 x 1.2 x 1.3 cm, previously 3.7 x 1.5 x 2.7 cm in radial and anti radial planes. A few small distance satellites can still be seen, with representative images taken of a 6 mm satellite approximately 2.5 cm closer to the nipple than the medial margin of the mass and a few small satellites approximately 3 cm more distant from the nipple from the margin of the mass.    ASSESSMENT & PLAN:   Cancer of overlapping sites of right female breast ([place]) # Breast Ca- ER/PR- Neg; Her2-NEG. T3N0- STAGE II; patient has a large primary breast malignancy [7-8 cm in size]; triple negative- on neoadjuvant chemo;Status post 12 weekly treatments of carbotaxol; partial response noted on ultrasound. Currently on Adriamycin Cytoxan status post cycle #1.  # Proceed with cycle #2 of  Adriamycin Cytoxan every 3 weeks. Labs are adequate.  # Question tooth infection- recommend clindamycin 300 mg 3 times a day for 10 days  # Tooth pain- continue hydrocodone one  every 12 hours. Avoid NSAIDs.  # hypokalemia- potassium 3.2. We'll supplement potassium 20 mEq twice a day.   # Patient follow-up with me in approximately 3 weeks with cycle #3; we will consider MRI of the breast if patient is willing.       Cammie Sickle, MD 02/17/2016 10:44 AM

## 2016-02-17 NOTE — Progress Notes (Signed)
Patient states she continues to have toothache.   Also states she has pain at her port site.  Also states nails on her hands and feet are turning dark.

## 2016-02-17 NOTE — Assessment & Plan Note (Addendum)
#  Breast Ca- ER/PR- Neg; Her2-NEG. T3N0- STAGE II; patient has a large primary breast malignancy [7-8 cm in size]; triple negative- on neoadjuvant chemo;Status post 12 weekly treatments of carbotaxol; partial response noted on ultrasound. Currently on Adriamycin Cytoxan status post cycle #1.  # Proceed with cycle #2 of Adriamycin Cytoxan every 3 weeks. Labs are adequate.  # Question tooth infection- recommend clindamycin 300 mg 3 times a day for 10 days  # Tooth pain- continue hydrocodone one every 12 hours. Avoid NSAIDs.  # hypokalemia- potassium 3.2. We'll supplement potassium 20 mEq twice a day.   # Patient follow-up with me in approximately 3 weeks with cycle #3; we will consider MRI of the breast if patient is willing.

## 2016-02-18 ENCOUNTER — Telehealth: Payer: Self-pay

## 2016-02-18 ENCOUNTER — Telehealth: Payer: Self-pay | Admitting: *Deleted

## 2016-02-18 MED ORDER — POTASSIUM CHLORIDE CRYS ER 20 MEQ PO TBCR: 40.0000 meq | EXTENDED_RELEASE_TABLET | Freq: Two times a day (BID) | ORAL | Status: DC

## 2016-02-18 NOTE — Telephone Encounter (Signed)
I discussed this with Dr B who ok'ed for rx to be filled early this time. I spoke with Wynonia Lawman who said he will fill it, but she will have to pay out of pocket for it. I called patient and explained this to her and she will call pharmacy to see how much it will cost her and she thanked me for calling her

## 2016-02-18 NOTE — Telephone Encounter (Signed)
-----   Message from Cammie Sickle, MD sent at 02/17/2016  6:25 PM EDT ----- Please prescribe her K-dur 20 BID x14 days- Thx

## 2016-02-18 NOTE — Telephone Encounter (Signed)
-----   Message from Cephus Richer sent at 02/18/2016  9:42 AM EDT ----- Contact: [phone] Please call pt she has question about getting some pain meds. She said she has used all the pain meds and don't think pharmacy will refill the other script she has gotten. She wants to know if we can call pharmacy to get it refill before the time it should be.

## 2016-02-18 NOTE — Telephone Encounter (Signed)
  Oncology Nurse Navigator Documentation  Navigator Location: [facility] (02/18/16 1400) Navigator Encounter Type: Telephone (02/18/16 1400) Telephone: Incoming Call (02/18/16 1400)                                        Time Spent with Patient: 30 (02/18/16 1400)   Received call from Patient. She has been given a new pain medication script and she states she would have to pay out of pocket for the medication. She is asking for [place] to call and find out how much it cost. She thinks she may get mad at them. Instructed her that is a basic question for them and they can answer that simply for her.

## 2016-02-18 NOTE — Telephone Encounter (Signed)
I spoke with Dawn Allen at [facility] and she is not due a refill because she just got # 60 tablets on 6/16 with directions of one tablet q 12 hours. (This was from the Rx written on 01/27/16) Nopt due to be filled again until 7/14

## 2016-02-18 NOTE — Telephone Encounter (Signed)
Resubmitted potassium rx to patient's pharmacy. Confirmation receipt confirmed.

## 2016-02-19 NOTE — Progress Notes (Unsigned)
  Oncology Nurse Navigator Documentation                                                   sdf

## 2016-02-25 ENCOUNTER — Inpatient Hospital Stay: Payer: Medicare Other | Attending: Internal Medicine

## 2016-02-25 DIAGNOSIS — Z171 Estrogen receptor negative status [ER-]: Secondary | ICD-10-CM | POA: Diagnosis not present

## 2016-02-25 DIAGNOSIS — Z7984 Long term (current) use of oral hypoglycemic drugs: Secondary | ICD-10-CM | POA: Insufficient documentation

## 2016-02-25 DIAGNOSIS — Z7901 Long term (current) use of anticoagulants: Secondary | ICD-10-CM | POA: Diagnosis not present

## 2016-02-25 DIAGNOSIS — Z7689 Persons encountering health services in other specified circumstances: Secondary | ICD-10-CM | POA: Insufficient documentation

## 2016-02-25 DIAGNOSIS — Z79899 Other long term (current) drug therapy: Secondary | ICD-10-CM | POA: Diagnosis not present

## 2016-02-25 DIAGNOSIS — G40909 Epilepsy, unspecified, not intractable, without status epilepticus: Secondary | ICD-10-CM | POA: Diagnosis not present

## 2016-02-25 DIAGNOSIS — F17210 Nicotine dependence, cigarettes, uncomplicated: Secondary | ICD-10-CM | POA: Insufficient documentation

## 2016-02-25 DIAGNOSIS — Z7982 Long term (current) use of aspirin: Secondary | ICD-10-CM | POA: Insufficient documentation

## 2016-02-25 DIAGNOSIS — Z86718 Personal history of other venous thrombosis and embolism: Secondary | ICD-10-CM | POA: Insufficient documentation

## 2016-02-25 DIAGNOSIS — C50811 Malignant neoplasm of overlapping sites of right female breast: Secondary | ICD-10-CM | POA: Diagnosis not present

## 2016-02-25 DIAGNOSIS — I2510 Atherosclerotic heart disease of native coronary artery without angina pectoris: Secondary | ICD-10-CM | POA: Diagnosis not present

## 2016-02-25 DIAGNOSIS — E876 Hypokalemia: Secondary | ICD-10-CM | POA: Diagnosis not present

## 2016-02-25 DIAGNOSIS — I10 Essential (primary) hypertension: Secondary | ICD-10-CM | POA: Insufficient documentation

## 2016-02-25 DIAGNOSIS — Z5111 Encounter for antineoplastic chemotherapy: Secondary | ICD-10-CM | POA: Diagnosis present

## 2016-02-25 DIAGNOSIS — F419 Anxiety disorder, unspecified: Secondary | ICD-10-CM | POA: Insufficient documentation

## 2016-02-25 DIAGNOSIS — Z85118 Personal history of other malignant neoplasm of bronchus and lung: Secondary | ICD-10-CM | POA: Diagnosis not present

## 2016-02-25 DIAGNOSIS — E785 Hyperlipidemia, unspecified: Secondary | ICD-10-CM | POA: Diagnosis not present

## 2016-02-25 DIAGNOSIS — I252 Old myocardial infarction: Secondary | ICD-10-CM | POA: Diagnosis not present

## 2016-02-25 DIAGNOSIS — K0889 Other specified disorders of teeth and supporting structures: Secondary | ICD-10-CM | POA: Insufficient documentation

## 2016-02-25 DIAGNOSIS — Z880 Allergy status to penicillin: Secondary | ICD-10-CM | POA: Diagnosis not present

## 2016-02-25 DIAGNOSIS — G479 Sleep disorder, unspecified: Secondary | ICD-10-CM | POA: Insufficient documentation

## 2016-02-25 LAB — BASIC METABOLIC PANEL
Anion gap: 9 (ref 5–15)
BUN: 20 mg/dL (ref 6–20)
CO2: 28 mmol/L (ref 22–32)
Calcium: 8.8 mg/dL — ABNORMAL LOW (ref 8.9–10.3)
Chloride: 99 mmol/L — ABNORMAL LOW (ref 101–111)
Creatinine, Ser: 0.9 mg/dL (ref 0.44–1.00)
Glucose, Bld: 231 mg/dL — ABNORMAL HIGH (ref 65–99)
Potassium: 3.6 mmol/L (ref 3.5–5.1)
SODIUM: 136 mmol/L (ref 135–145)

## 2016-02-25 LAB — CBC WITH DIFFERENTIAL/PLATELET
BAND NEUTROPHILS: 7 %
BASOS PCT: 0 %
Basophils Absolute: 0 10*3/uL (ref 0–0.1)
Blasts: 0 %
EOS ABS: 0.6 10*3/uL (ref 0–0.7)
EOS PCT: 5 %
HCT: 31.4 % — ABNORMAL LOW (ref 35.0–47.0)
Hemoglobin: 10.3 g/dL — ABNORMAL LOW (ref 12.0–16.0)
LYMPHS ABS: 1.4 10*3/uL (ref 1.0–3.6)
LYMPHS PCT: 12 %
MCH: 31.4 pg (ref 26.0–34.0)
MCHC: 32.8 g/dL (ref 32.0–36.0)
MCV: 95.6 fL (ref 80.0–100.0)
MONO ABS: 1.3 10*3/uL — AB (ref 0.2–0.9)
Metamyelocytes Relative: 3 %
Monocytes Relative: 11 %
Myelocytes: 0 %
NEUTROS PCT: 62 %
NRBC: 4 /100{WBCs} — AB
Neutro Abs: 8.7 10*3/uL — ABNORMAL HIGH (ref 1.4–6.5)
OTHER: 0 %
PLATELETS: 152 10*3/uL (ref 150–440)
Promyelocytes Absolute: 0 %
RBC: 3.28 MIL/uL — ABNORMAL LOW (ref 3.80–5.20)
RDW: 21.5 % — AB (ref 11.5–14.5)
WBC: 12 10*3/uL — ABNORMAL HIGH (ref 3.6–11.0)

## 2016-02-26 ENCOUNTER — Telehealth: Payer: Self-pay | Admitting: *Deleted

## 2016-02-26 NOTE — Telephone Encounter (Signed)
Patient called cancer center. States "I just don't want to have my chemotherapy on 718/17. I feel very tired and I feel like I need to give my self a treatment break."  I want my chemo to be moved to 03/16/16.  [name] made aware. Pt advised not to delay treatments; however, pt insists on cnl next chemo apt and post poning the tx by 1 week.

## 2016-03-02 ENCOUNTER — Other Ambulatory Visit: Payer: Medicare Other

## 2016-03-05 NOTE — Telephone Encounter (Signed)
x

## 2016-03-09 ENCOUNTER — Other Ambulatory Visit: Payer: Medicare Other

## 2016-03-09 ENCOUNTER — Ambulatory Visit: Payer: Medicare Other | Admitting: Internal Medicine

## 2016-03-09 ENCOUNTER — Ambulatory Visit: Payer: Medicare Other

## 2016-03-16 ENCOUNTER — Inpatient Hospital Stay: Payer: Medicare Other

## 2016-03-16 ENCOUNTER — Other Ambulatory Visit: Payer: Self-pay | Admitting: Internal Medicine

## 2016-03-16 ENCOUNTER — Inpatient Hospital Stay (HOSPITAL_BASED_OUTPATIENT_CLINIC_OR_DEPARTMENT_OTHER): Payer: Medicare Other | Admitting: Internal Medicine

## 2016-03-16 ENCOUNTER — Other Ambulatory Visit: Payer: Self-pay

## 2016-03-16 VITALS — BP 148/88 | HR 95 | Temp 97.1°F | Resp 18

## 2016-03-16 VITALS — BP 154/90 | HR 88 | Temp 97.8°F | Resp 18 | Wt 186.1 lb

## 2016-03-16 DIAGNOSIS — Z79899 Other long term (current) drug therapy: Secondary | ICD-10-CM

## 2016-03-16 DIAGNOSIS — C50811 Malignant neoplasm of overlapping sites of right female breast: Secondary | ICD-10-CM

## 2016-03-16 DIAGNOSIS — Z171 Estrogen receptor negative status [ER-]: Secondary | ICD-10-CM | POA: Diagnosis not present

## 2016-03-16 DIAGNOSIS — E785 Hyperlipidemia, unspecified: Secondary | ICD-10-CM

## 2016-03-16 DIAGNOSIS — I252 Old myocardial infarction: Secondary | ICD-10-CM

## 2016-03-16 DIAGNOSIS — F419 Anxiety disorder, unspecified: Secondary | ICD-10-CM

## 2016-03-16 DIAGNOSIS — Z7982 Long term (current) use of aspirin: Secondary | ICD-10-CM

## 2016-03-16 DIAGNOSIS — Z86718 Personal history of other venous thrombosis and embolism: Secondary | ICD-10-CM

## 2016-03-16 DIAGNOSIS — Z7984 Long term (current) use of oral hypoglycemic drugs: Secondary | ICD-10-CM

## 2016-03-16 DIAGNOSIS — Z85118 Personal history of other malignant neoplasm of bronchus and lung: Secondary | ICD-10-CM

## 2016-03-16 DIAGNOSIS — I2510 Atherosclerotic heart disease of native coronary artery without angina pectoris: Secondary | ICD-10-CM

## 2016-03-16 DIAGNOSIS — E876 Hypokalemia: Secondary | ICD-10-CM

## 2016-03-16 DIAGNOSIS — Z5111 Encounter for antineoplastic chemotherapy: Secondary | ICD-10-CM | POA: Diagnosis not present

## 2016-03-16 DIAGNOSIS — G40909 Epilepsy, unspecified, not intractable, without status epilepticus: Secondary | ICD-10-CM

## 2016-03-16 DIAGNOSIS — I10 Essential (primary) hypertension: Secondary | ICD-10-CM

## 2016-03-16 DIAGNOSIS — G479 Sleep disorder, unspecified: Secondary | ICD-10-CM

## 2016-03-16 DIAGNOSIS — Z7901 Long term (current) use of anticoagulants: Secondary | ICD-10-CM

## 2016-03-16 DIAGNOSIS — F17210 Nicotine dependence, cigarettes, uncomplicated: Secondary | ICD-10-CM

## 2016-03-16 DIAGNOSIS — K0889 Other specified disorders of teeth and supporting structures: Secondary | ICD-10-CM

## 2016-03-16 LAB — CBC WITH DIFFERENTIAL/PLATELET
BASOS ABS: 0.1 10*3/uL (ref 0–0.1)
BASOS PCT: 1 %
EOS ABS: 0.5 10*3/uL (ref 0–0.7)
Eosinophils Relative: 6 %
HEMATOCRIT: 34.5 % — AB (ref 35.0–47.0)
HEMOGLOBIN: 11.4 g/dL — AB (ref 12.0–16.0)
Lymphocytes Relative: 16 %
Lymphs Abs: 1.5 10*3/uL (ref 1.0–3.6)
MCH: 32 pg (ref 26.0–34.0)
MCHC: 33.2 g/dL (ref 32.0–36.0)
MCV: 96.3 fL (ref 80.0–100.0)
MONOS PCT: 10 %
Monocytes Absolute: 0.9 10*3/uL (ref 0.2–0.9)
NEUTROS ABS: 6.1 10*3/uL (ref 1.4–6.5)
NEUTROS PCT: 67 %
Platelets: 169 10*3/uL (ref 150–440)
RBC: 3.58 MIL/uL — AB (ref 3.80–5.20)
RDW: 19 % — ABNORMAL HIGH (ref 11.5–14.5)
WBC: 9 10*3/uL (ref 3.6–11.0)

## 2016-03-16 LAB — COMPREHENSIVE METABOLIC PANEL
ALBUMIN: 3.3 g/dL — AB (ref 3.5–5.0)
ALK PHOS: 200 U/L — AB (ref 38–126)
ALT: 15 U/L (ref 14–54)
ANION GAP: 9 (ref 5–15)
AST: 14 U/L — AB (ref 15–41)
BILIRUBIN TOTAL: 0.4 mg/dL (ref 0.3–1.2)
BUN: 22 mg/dL — AB (ref 6–20)
CALCIUM: 8.6 mg/dL — AB (ref 8.9–10.3)
CO2: 28 mmol/L (ref 22–32)
Chloride: 98 mmol/L — ABNORMAL LOW (ref 101–111)
Creatinine, Ser: 0.74 mg/dL (ref 0.44–1.00)
GFR calc Af Amer: >60 mL/min (ref >60)
GFR calc non Af Amer: >60 mL/min (ref >60)
GLUCOSE: 197 mg/dL — AB (ref 65–99)
Potassium: 3.2 mmol/L — ABNORMAL LOW (ref 3.5–5.1)
SODIUM: 135 mmol/L (ref 135–145)
TOTAL PROTEIN: 7.1 g/dL (ref 6.5–8.1)

## 2016-03-16 MED ORDER — HEPARIN SOD (PORK) LOCK FLUSH 100 UNIT/ML IV SOLN
500.0000 [IU] | Freq: Once | INTRAVENOUS | Status: AC | PRN
Start: 2016-03-16 — End: 2016-03-16
  Administered 2016-03-16: 500 [IU]
  Filled 2016-03-16: qty 5

## 2016-03-16 MED ORDER — SODIUM CHLORIDE 0.9% FLUSH
10.0000 mL | INTRAVENOUS | Status: DC | PRN
  Administered 2016-03-16: 10 mL
  Filled 2016-03-16: qty 10

## 2016-03-16 MED ORDER — SODIUM CHLORIDE 0.9 % IV SOLN
600.0000 mg/m2 | Freq: Once | INTRAVENOUS | Status: AC
  Administered 2016-03-16: 1220 mg via INTRAVENOUS
  Filled 2016-03-16: qty 61

## 2016-03-16 MED ORDER — PEGFILGRASTIM 6 MG/0.6ML ~~LOC~~ PSKT
6.0000 mg | PREFILLED_SYRINGE | Freq: Once | SUBCUTANEOUS | Status: AC
  Administered 2016-03-16: 6 mg via SUBCUTANEOUS

## 2016-03-16 MED ORDER — SODIUM CHLORIDE 0.9 % IV SOLN
Freq: Once | INTRAVENOUS | Status: AC
  Administered 2016-03-16: 10:00:00 via INTRAVENOUS
  Filled 2016-03-16: qty 1000

## 2016-03-16 MED ORDER — DOXORUBICIN HCL CHEMO IV INJECTION 2 MG/ML
60.0000 mg/m2 | Freq: Once | INTRAVENOUS | Status: AC
  Administered 2016-03-16: 122 mg via INTRAVENOUS
  Filled 2016-03-16: qty 61

## 2016-03-16 MED ORDER — SODIUM CHLORIDE 0.9 % IV SOLN
Freq: Once | INTRAVENOUS | Status: AC
  Administered 2016-03-16: 10:00:00 via INTRAVENOUS
  Filled 2016-03-16: qty 5

## 2016-03-16 MED ORDER — PALONOSETRON HCL INJECTION 0.25 MG/5ML
0.2500 mg | Freq: Once | INTRAVENOUS | Status: AC
  Administered 2016-03-16: 0.25 mg via INTRAVENOUS

## 2016-03-16 NOTE — Assessment & Plan Note (Signed)
#  Breast Ca- ER/PR- Neg; Her2-NEG. T3N0- STAGE II; triple negative- on neoadjuvant chemo;Status post 12 weekly treatments of carbotaxol; partial response noted on ultrasound. Currently on Adriamycin Cytoxan status post cycle # 2  # Proceed with cycle #3  of Adriamycin Cytoxan every 3 weeks. Labs are adequate.  # Anxiety- defer to PCP.   # Tooth pain- continue hydrocodone one every 12 hours. Avoid NSAIDs.  # hypokalemia- potassium 3.2. Reiterated to take potassium 20 mEq twice a day.   Follow-up with me in 3 weeks with labs/cycle #4 of Adriamycin Cytoxan. We'll also make an appt to follow up with Dr. Azalee Course for planning of upcoming surgery.

## 2016-03-16 NOTE — Progress Notes (Signed)
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   # Smoker; ? Paranoia; Feb 2017- MUGA scan- 67%     Cancer of overlapping sites of right female breast ([facility]   02/17/2016 Initial Diagnosis    Cancer of overlapping sites of right female breast ([facility]      INTERVAL HISTORY: Poor- vague historian  65year old female patient with above history of newly diagnosed breast cancer- triple negative; clinical stage II- currently on neo-adjuvant; status post weekly carbo-taxol x 12- is here for follow-up/ s/p cycle #2 of Adriamycin Cytoxan 4 weeks ago.   Patient complains of difficulty sleeping- secondary to anxiety.; Tooth pain resolved. She continues to take hydrocodone one pill twice a day as needed.  Patient has not been taking potassium as recommended.   Complains of constipation; on dulcolax. Otherwise patient denies any unusual shortness of breath or chest pain or cough.   REVIEW OF SYSTEMS: Difficult to assess given as patient is a poor historian.  PAST MEDICAL HISTORY :  Past Medical History:  Diagnosis Date  . Anxiety   . Breast cancer ([facility] 08/2015   right breast, chemo  . Cancer of right female breast ([facility]   . Coronary artery disease    a. NSTEMI cath 08/02/14: LM nl, pLAD 50%, mLCx 30%, mRCA 95% s/p PCI/DES, 2nd lesion 40%, EF 55%  . Diabetes mellitus without complication ([facility]   . DVT (deep venous thrombosis) (HCC)    LEFT LEG   . HLD (hyperlipidemia)   .  Hypertension   . Lung nodule   . MI (myocardial infarction) ([facility]   . Seizure disorder ([facility]   . Seizures ([facility]   . Squamous cell lung cancer ([facility] 04/23/2015    PAST SURGICAL HISTORY :   Past Surgical History:  Procedure Laterality Date  . ABDOMINAL HYSTERECTOMY     PARTIAL   . BREAST BIOPSY Right 09/15/2015   positve  . CARDIAC CATHETERIZATION  08/02/2014  . CORONARY ANGIOPLASTY  08/02/2014   drug eluting stent placement  . IVC FILTER PLACEMENT ([facility] HX)    . LEG SURGERY Right    BLOOD CLOT  . PORTACATH PLACEMENT Left 10/13/2015   Procedure: INSERTION PORT-A-CATH;  Surgeon: CHubbard Robinson MD;  Location: [facility] ORS;  Service: General;  Laterality: Left;    FAMILY HISTORY :   Family History  Problem Relation Age of Onset  . Heart attack Mother   . Breast cancer Neg Hx     SOCIAL HISTORY:   Social History  Substance Use Topics  . Smoking status: Current Every Day Smoker    Packs/day: 0.50    Years: 45.00    Types: Cigarettes  . Smokeless tobacco: Never Used  . Alcohol use No    ALLERGIES:  is allergic to penicillins.  MEDICATIONS:  Current Outpatient Prescriptions  Medication Sig Dispense Refill  . apixaban (ELIQUIS) 2.5 MG TABS tablet Take 2.5 mg by mouth 2 (two) times  daily. Reported on 01/01/2016    . Aspirin-Salicylamide-Caffeine (BC HEADACHE POWDER PO) Take 1 packet by mouth daily.    Marland Kitchen atorvastatin (LIPITOR) 40 MG tablet Take 1 tablet by mouth 1 day or 1 dose. Reported on 12/16/2015    . clindamycin (CLEOCIN) 300 MG capsule Take 1 capsule (300 mg total) by mouth 3 (three) times daily. 30 capsule 0  . diazepam (VALIUM) 5 MG tablet Take 1 tablet by mouth 2 (two) times daily as needed. Reported on 12/16/2015    . empagliflozin (JARDIANCE) 25 MG TABS tablet Take 25 mg by mouth daily.    Marland Kitchen gabapentin (NEURONTIN) 300 MG capsule Take 1 capsule (300 mg total) by mouth 3 (three) times daily. 90 capsule 0  . HYDROcodone-acetaminophen (NORCO/VICODIN) 5-325 MG tablet Take  1 tablet by mouth every 12 (twelve) hours as needed. For pain 60 tablet 0  . lidocaine-prilocaine (EMLA) cream Apply 1 application topically as needed. Apply to port a cath site 1 hour before chemotherapy treatment. 30 g 6  . LINZESS 145 MCG CAPS capsule Reported on 01/20/2016    . lisinopril-hydrochlorothiazide (PRINZIDE,ZESTORETIC) 20-25 MG per tablet Take 1 tablet by mouth daily.     . metFORMIN (GLUCOPHAGE) 1000 MG tablet Take 1 tablet by mouth 2 (two) times daily. Reported on 10/08/2015    . naproxen (NAPROSYN) 500 MG tablet Take 1 tablet by mouth 2 (two) times daily. Reported on 11/03/2015    . nitroGLYCERIN (NITROSTAT) 0.4 MG SL tablet Place 0.4 mg under the tongue every 5 (five) minutes as needed for chest pain. Reported on 12/16/2015    . ondansetron (ZOFRAN) 8 MG tablet Take 1 tablet (8 mg total) by mouth every 8 (eight) hours as needed for nausea or vomiting (start 3 days; after chemo). 40 tablet 0  . phenytoin (DILANTIN) 100 MG ER capsule Take 300 mg by mouth daily.     . pioglitazone (ACTOS) 45 MG tablet Take 1 tablet by mouth daily.    . potassium chloride SA (K-DUR,KLOR-CON) 20 MEQ tablet Take 2 tablets (40 mEq total) by mouth 2 (two) times daily. 30 tablet 3  . prochlorperazine (COMPAZINE) 10 MG tablet Take 1 tablet (10 mg total) by mouth every 6 (six) hours as needed for nausea or vomiting. 40 tablet 0  . VENTOLIN HFA 108 (90 Base) MCG/ACT inhaler Reported on 01/20/2016     No current facility-administered medications for this visit.    Facility-Administered Medications Ordered in Other Visits  Medication Dose Route Frequency Provider Last Rate Last Dose  . albuterol (PROVENTIL) (2.5 MG/3ML) 0.083% nebulizer solution 2.5 mg  2.5 mg Nebulization Once Nestor Lewandowsky, MD      . sodium chloride flush (NS) 0.9 % injection 10 mL  10 mL Intravenous PRN Cammie Sickle, MD   10 mL at 12/01/15 0850    PHYSICAL EXAMINATION: ECOG PERFORMANCE STATUS: 0 - Asymptomatic  BP (!) 154/90 (BP  Location: Left Arm, Patient Position: Sitting)   Pulse 88   Temp 97.8 F (36.6 C) (Tympanic)   Resp 18   Wt 186 lb 1.1 oz (84.4 kg)   BMI 30.96 kg/m   Filed Weights   03/16/16 0908  Weight: 186 lb 1.1 oz (84.4 kg)    GENERAL: Well-nourished well-developed; Alert, no distress and comfortable. Alone;  EYES: no pallor or icterus OROPHARYNX: no thrush or ulceration; right lower jaw molar- tenderness; no pus noted.  NECK: supple, no masses felt LYMPH: no palpable lymphadenopathy in the cervical, axillary or inguinal regions  LUNGS: clear to auscultation and No wheeze or crackles HEART/CVS: regular rate & rhythm and no murmurs; No lower extremity edema ABDOMEN:abdomen soft, non-tender and normal bowel sounds Musculoskeletal:no cyanosis of digits and no clubbing  PSYCH: alert & oriented x 3 with fluent speech NEURO: no focal motor/sensory deficits SKIN: no rashes or significant lesions; right breast mass continued improvement noted [exam in presence of nurse]    LABORATORY DATA:  I have reviewed the data as listed    Component Value Date/Time   NA 135 03/16/2016 0847   NA 141 08/03/2014 0050   K 3.2 (L) 03/16/2016 0847   K 3.3 (L) 08/03/2014 0050   CL 98 (L) 03/16/2016 0847   CL 108 (H) 08/03/2014 0050   CO2 28 03/16/2016 0847   CO2 27 08/03/2014 0050   GLUCOSE 197 (H) 03/16/2016 0847   GLUCOSE 164 (H) 08/03/2014 0050   BUN 22 (H) 03/16/2016 0847   BUN 14 08/03/2014 0050   CREATININE 0.74 03/16/2016 0847   CREATININE 0.68 08/03/2014 0050   CALCIUM 8.6 (L) 03/16/2016 0847   CALCIUM 8.2 (L) 08/03/2014 0050   PROT 7.1 03/16/2016 0847   PROT 6.7 04/11/2013 2241   ALBUMIN 3.3 (L) 03/16/2016 0847   ALBUMIN 2.7 (L) 04/11/2013 2241   AST 14 (L) 03/16/2016 0847   AST 13 (L) 04/11/2013 2241   ALT 15 03/16/2016 0847   ALT 16 04/11/2013 2241   ALKPHOS 200 (H) 03/16/2016 0847   ALKPHOS 207 (H) 04/11/2013 2241   BILITOT 0.4 03/16/2016 0847   BILITOT 0.1 (L) 04/11/2013 2241    GFRNONAA >60 03/16/2016 0847   GFRNONAA >60 08/03/2014 0050   GFRNONAA >60 04/11/2013 2241   GFRAA >60 03/16/2016 0847   GFRAA >60 08/03/2014 0050   GFRAA >60 04/11/2013 2241    No results found for: SPEP, UPEP  Lab Results  Component Value Date   WBC 9.0 03/16/2016   NEUTROABS 6.1 03/16/2016   HGB 11.4 (L) 03/16/2016   HCT 34.5 (L) 03/16/2016   MCV 96.3 03/16/2016   PLT 169 03/16/2016      Chemistry      Component Value Date/Time   NA 135 03/16/2016 0847   NA 141 08/03/2014 0050   K 3.2 (L) 03/16/2016 0847   K 3.3 (L) 08/03/2014 0050   CL 98 (L) 03/16/2016 0847   CL 108 (H) 08/03/2014 0050   CO2 28 03/16/2016 0847   CO2 27 08/03/2014 0050   BUN 22 (H) 03/16/2016 0847   BUN 14 08/03/2014 0050   CREATININE 0.74 03/16/2016 0847   CREATININE 0.68 08/03/2014 0050      Component Value Date/Time   CALCIUM 8.6 (L) 03/16/2016 0847   CALCIUM 8.2 (L) 08/03/2014 0050   ALKPHOS 200 (H) 03/16/2016 0847   ALKPHOS 207 (H) 04/11/2013 2241   AST 14 (L) 03/16/2016 0847   AST 13 (L) 04/11/2013 2241   ALT 15 03/16/2016 0847   ALT 16 04/11/2013 2241   BILITOT 0.4 03/16/2016 0847   BILITOT 0.1 (L) 04/11/2013 2241    12/26/2015:   Ultrasound targeted to the right breast at 11 o'clock, 8 cm from the nipple demonstrates again demonstrates the irregular hypoechoic mass which measures approximately 2.2 x 1.2 x 1.3 cm, previously 3.7 x 1.5 x 2.7 cm in radial and anti radial planes. A few small distance satellites can still be seen, with representative images taken of a 6 mm satellite approximately 2.5 cm closer to the nipple than the medial margin of  the mass and a few small satellites approximately 3 cm more distant from the nipple from the margin of the mass.    ASSESSMENT & PLAN:   Cancer of overlapping sites of right female breast ([place]) # Breast Ca- ER/PR- Neg; Her2-NEG. T3N0- STAGE II; triple negative- on neoadjuvant chemo;Status post 12 weekly treatments of carbotaxol;  partial response noted on ultrasound. Currently on Adriamycin Cytoxan status post cycle # 2  # Proceed with cycle #3  of Adriamycin Cytoxan every 3 weeks. Labs are adequate.  # Anxiety- defer to PCP.   # Tooth pain- continue hydrocodone one every 12 hours. Avoid NSAIDs.  # hypokalemia- potassium 3.2. Reiterated to take potassium 20 mEq twice a day.   Follow-up with me in 3 weeks with labs/cycle #4 of Adriamycin Cytoxan. We'll also make an appt to follow up with Dr. Azalee Course for planning of upcoming surgery.      Cammie Sickle, MD 03/16/2016 9:29 AM

## 2016-03-17 ENCOUNTER — Telehealth: Payer: Self-pay | Admitting: *Deleted

## 2016-03-17 NOTE — Telephone Encounter (Signed)
Patient called stating it is 4:30pm and it is time to take her neulasta on pro patch off. She said she is trying to take it off but she cant get it off. She has someone who can come by her house at [facility] and remove it for her; she is wondering if it is safe to leave it on until then. I reassured patient that it will be perfectly okay to leave On pro on her arm until then. I explained the medicine has completely  gone in, it is just an empty cartridge sitting on her arm now.

## 2016-04-02 ENCOUNTER — Telehealth: Payer: Self-pay | Admitting: *Deleted

## 2016-04-02 DIAGNOSIS — G8928 Other chronic postprocedural pain: Secondary | ICD-10-CM

## 2016-04-02 MED ORDER — HYDROCODONE-ACETAMINOPHEN 5-325 MG PO TABS: 1.0000 | ORAL_TABLET | Freq: Two times a day (BID) | ORAL | 0 refills | Status: DC | PRN

## 2016-04-02 NOTE — Telephone Encounter (Signed)
Called to report that she is having pain at her port site, which is a little swollen, she rates it at 8/10 on pain scale. Denies redness or heat at site. Reports aching all over too. She reports that she has bruising, but she gets this frequently and it goes away within a few days. She mentioned that she generally gets Hydrocodone from Dr Rogue Bussing When she is hurting. She refuses to come in to office to be evaluated even when offered our [name] to transport her. Her last Hydrocodone prescription was on 02/17/16 for # 60 tabs. Please advise

## 2016-04-13 ENCOUNTER — Encounter: Payer: Self-pay | Admitting: Internal Medicine

## 2016-04-13 ENCOUNTER — Inpatient Hospital Stay (HOSPITAL_BASED_OUTPATIENT_CLINIC_OR_DEPARTMENT_OTHER): Payer: Medicare Other | Admitting: Internal Medicine

## 2016-04-13 ENCOUNTER — Other Ambulatory Visit: Payer: Self-pay | Admitting: *Deleted

## 2016-04-13 ENCOUNTER — Telehealth: Payer: Self-pay

## 2016-04-13 ENCOUNTER — Inpatient Hospital Stay: Payer: Medicare Other | Attending: Internal Medicine

## 2016-04-13 ENCOUNTER — Inpatient Hospital Stay: Payer: Medicare Other

## 2016-04-13 VITALS — BP 147/78 | HR 81 | Temp 97.4°F | Resp 18

## 2016-04-13 VITALS — BP 119/77 | HR 81 | Temp 97.5°F | Resp 18 | Ht 65.0 in | Wt 183.2 lb

## 2016-04-13 DIAGNOSIS — I2510 Atherosclerotic heart disease of native coronary artery without angina pectoris: Secondary | ICD-10-CM | POA: Insufficient documentation

## 2016-04-13 DIAGNOSIS — G40909 Epilepsy, unspecified, not intractable, without status epilepticus: Secondary | ICD-10-CM | POA: Diagnosis not present

## 2016-04-13 DIAGNOSIS — F17210 Nicotine dependence, cigarettes, uncomplicated: Secondary | ICD-10-CM | POA: Diagnosis not present

## 2016-04-13 DIAGNOSIS — E785 Hyperlipidemia, unspecified: Secondary | ICD-10-CM | POA: Insufficient documentation

## 2016-04-13 DIAGNOSIS — F419 Anxiety disorder, unspecified: Secondary | ICD-10-CM | POA: Insufficient documentation

## 2016-04-13 DIAGNOSIS — Z9221 Personal history of antineoplastic chemotherapy: Secondary | ICD-10-CM

## 2016-04-13 DIAGNOSIS — Z170 Estrogen receptor positive status [ER+]: Secondary | ICD-10-CM

## 2016-04-13 DIAGNOSIS — Z79899 Other long term (current) drug therapy: Secondary | ICD-10-CM | POA: Diagnosis not present

## 2016-04-13 DIAGNOSIS — Z85850 Personal history of malignant neoplasm of thyroid: Secondary | ICD-10-CM

## 2016-04-13 DIAGNOSIS — C50811 Malignant neoplasm of overlapping sites of right female breast: Secondary | ICD-10-CM | POA: Insufficient documentation

## 2016-04-13 DIAGNOSIS — I10 Essential (primary) hypertension: Secondary | ICD-10-CM

## 2016-04-13 DIAGNOSIS — Z85118 Personal history of other malignant neoplasm of bronchus and lung: Secondary | ICD-10-CM | POA: Insufficient documentation

## 2016-04-13 DIAGNOSIS — G8929 Other chronic pain: Secondary | ICD-10-CM | POA: Diagnosis not present

## 2016-04-13 DIAGNOSIS — K5900 Constipation, unspecified: Secondary | ICD-10-CM

## 2016-04-13 DIAGNOSIS — G8928 Other chronic postprocedural pain: Secondary | ICD-10-CM

## 2016-04-13 DIAGNOSIS — Z7689 Persons encountering health services in other specified circumstances: Secondary | ICD-10-CM | POA: Insufficient documentation

## 2016-04-13 DIAGNOSIS — E119 Type 2 diabetes mellitus without complications: Secondary | ICD-10-CM | POA: Insufficient documentation

## 2016-04-13 DIAGNOSIS — Z7901 Long term (current) use of anticoagulants: Secondary | ICD-10-CM | POA: Insufficient documentation

## 2016-04-13 DIAGNOSIS — I252 Old myocardial infarction: Secondary | ICD-10-CM

## 2016-04-13 DIAGNOSIS — M25512 Pain in left shoulder: Secondary | ICD-10-CM | POA: Insufficient documentation

## 2016-04-13 DIAGNOSIS — E876 Hypokalemia: Secondary | ICD-10-CM

## 2016-04-13 DIAGNOSIS — Z7982 Long term (current) use of aspirin: Secondary | ICD-10-CM

## 2016-04-13 DIAGNOSIS — Z86718 Personal history of other venous thrombosis and embolism: Secondary | ICD-10-CM | POA: Insufficient documentation

## 2016-04-13 LAB — CBC WITH DIFFERENTIAL/PLATELET
Basophils Absolute: 0.1 10*3/uL (ref 0–0.1)
Basophils Relative: 2 %
EOS PCT: 5 %
Eosinophils Absolute: 0.4 10*3/uL (ref 0–0.7)
HCT: 36.9 % (ref 35.0–47.0)
Hemoglobin: 12.1 g/dL (ref 12.0–16.0)
LYMPHS ABS: 1.3 10*3/uL (ref 1.0–3.6)
LYMPHS PCT: 18 %
MCH: 31 pg (ref 26.0–34.0)
MCHC: 32.8 g/dL (ref 32.0–36.0)
MCV: 94.5 fL (ref 80.0–100.0)
MONO ABS: 0.8 10*3/uL (ref 0.2–0.9)
Monocytes Relative: 11 %
Neutro Abs: 4.8 10*3/uL (ref 1.4–6.5)
Neutrophils Relative %: 64 %
PLATELETS: 160 10*3/uL (ref 150–440)
RBC: 3.91 MIL/uL (ref 3.80–5.20)
RDW: 17.5 % — AB (ref 11.5–14.5)
WBC: 7.4 10*3/uL (ref 3.6–11.0)

## 2016-04-13 LAB — COMPREHENSIVE METABOLIC PANEL
ALT: 17 U/L (ref 14–54)
AST: 17 U/L (ref 15–41)
Albumin: 3.5 g/dL (ref 3.5–5.0)
Alkaline Phosphatase: 200 U/L — ABNORMAL HIGH (ref 38–126)
Anion gap: 7 (ref 5–15)
BILIRUBIN TOTAL: 0.3 mg/dL (ref 0.3–1.2)
BUN: 27 mg/dL — ABNORMAL HIGH (ref 6–20)
CHLORIDE: 101 mmol/L (ref 101–111)
CO2: 29 mmol/L (ref 22–32)
CREATININE: 1.1 mg/dL — AB (ref 0.44–1.00)
Calcium: 8.6 mg/dL — ABNORMAL LOW (ref 8.9–10.3)
GFR, EST NON AFRICAN AMERICAN: 52 mL/min — AB (ref >60)
Glucose, Bld: 233 mg/dL — ABNORMAL HIGH (ref 65–99)
Potassium: 3.6 mmol/L (ref 3.5–5.1)
Sodium: 137 mmol/L (ref 135–145)
TOTAL PROTEIN: 7.1 g/dL (ref 6.5–8.1)

## 2016-04-13 MED ORDER — SODIUM CHLORIDE 0.9% FLUSH
10.0000 mL | INTRAVENOUS | Status: DC | PRN
  Administered 2016-04-13: 10 mL
  Filled 2016-04-13: qty 10

## 2016-04-13 MED ORDER — PALONOSETRON HCL INJECTION 0.25 MG/5ML
0.2500 mg | Freq: Once | INTRAVENOUS | Status: AC
  Administered 2016-04-13: 0.25 mg via INTRAVENOUS
  Filled 2016-04-13: qty 5

## 2016-04-13 MED ORDER — SODIUM CHLORIDE 0.9 % IV SOLN
Freq: Once | INTRAVENOUS | Status: AC
  Administered 2016-04-13: 10:00:00 via INTRAVENOUS
  Filled 2016-04-13: qty 1000

## 2016-04-13 MED ORDER — SODIUM CHLORIDE 0.9 % IV SOLN
Freq: Once | INTRAVENOUS | Status: AC
  Administered 2016-04-13: 11:00:00 via INTRAVENOUS
  Filled 2016-04-13: qty 5

## 2016-04-13 MED ORDER — PEGFILGRASTIM 6 MG/0.6ML ~~LOC~~ PSKT
6.0000 mg | PREFILLED_SYRINGE | Freq: Once | SUBCUTANEOUS | Status: AC
  Administered 2016-04-13: 6 mg via SUBCUTANEOUS
  Filled 2016-04-13: qty 0.6

## 2016-04-13 MED ORDER — SODIUM CHLORIDE 0.9 % IV SOLN
600.0000 mg/m2 | Freq: Once | INTRAVENOUS | Status: AC
  Administered 2016-04-13: 1220 mg via INTRAVENOUS
  Filled 2016-04-13: qty 61

## 2016-04-13 MED ORDER — HEPARIN SOD (PORK) LOCK FLUSH 100 UNIT/ML IV SOLN
500.0000 [IU] | Freq: Once | INTRAVENOUS | Status: AC | PRN
  Administered 2016-04-13: 500 [IU]
  Filled 2016-04-13: qty 5

## 2016-04-13 MED ORDER — DOXORUBICIN HCL CHEMO IV INJECTION 2 MG/ML
60.0000 mg/m2 | Freq: Once | INTRAVENOUS | Status: AC
  Administered 2016-04-13: 122 mg via INTRAVENOUS
  Filled 2016-04-13: qty 61

## 2016-04-13 MED ORDER — HYDROCODONE-ACETAMINOPHEN 5-325 MG PO TABS: 1.0000 | ORAL_TABLET | Freq: Two times a day (BID) | ORAL | 0 refills | Status: DC | PRN

## 2016-04-13 NOTE — Progress Notes (Signed)
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   # Smoker; ? Paranoia; Feb 2017- MUGA scan- 67%     Cancer of overlapping sites of right female breast (HCC)   02/17/2016 Initial Diagnosis    Cancer of overlapping sites of right female breast (HCC)       INTERVAL HISTORY: Poor- vague historian  65-year-old female patient with above history of newly diagnosed breast cancer- triple negative; clinical stage II- currently on neo-adjuvant; status post weekly carbo-taxol x 12- is here for follow-up/ s/p cycle #3 of Adriamycin Cytoxan 4 weeks ago.   Complains of pain in her left shoulder/ skin itching. She continues to take hydrocodone one pill twice a day as needed.  Complains of constipation; on dulcolax. Otherwise patient denies any unusual shortness of breath or chest pain or cough.   REVIEW OF SYSTEMS: Difficult to assess given as patient is a poor historian.  PAST MEDICAL HISTORY :  Past Medical History:  Diagnosis Date  . Anxiety   . Breast cancer (HCC) 08/2015   right breast, chemo  . Cancer of right female breast (HCC)   . Coronary artery disease    a. NSTEMI cath 08/02/14: LM nl, pLAD 50%, mLCx 30%, mRCA 95% s/p PCI/DES, 2nd lesion 40%, EF 55%  . Diabetes mellitus without complication (HCC)   . DVT (deep venous thrombosis) (HCC)    LEFT LEG   . HLD (hyperlipidemia)   . Hypertension   . Lung nodule   . MI (myocardial infarction) (HCC)   . Seizure  disorder (HCC)   . Seizures (HCC)   . Squamous cell lung cancer (HCC) 04/23/2015    PAST SURGICAL HISTORY :   Past Surgical History:  Procedure Laterality Date  . ABDOMINAL HYSTERECTOMY     PARTIAL   . BREAST BIOPSY Right 09/15/2015   positve  . CARDIAC CATHETERIZATION  08/02/2014  . CORONARY ANGIOPLASTY  08/02/2014   drug eluting stent placement  . IVC FILTER PLACEMENT ([facility] HX)    . LEG SURGERY Right    BLOOD CLOT  . PORTACATH PLACEMENT Left 10/13/2015   Procedure: INSERTION PORT-A-CATH;  Surgeon: Catherine L Loflin, MD;  Location: [facility] ORS;  Service: General;  Laterality: Left;    FAMILY HISTORY :   Family History  Problem Relation Age of Onset  . Heart attack Mother   . Breast cancer Neg Hx     SOCIAL HISTORY:   Social History  Substance Use Topics  . Smoking status: Current Every Day Smoker    Packs/day: 0.50    Years: 45.00    Types: Cigarettes  . Smokeless tobacco: Never Used  . Alcohol use No    ALLERGIES:  is allergic to penicillins.  MEDICATIONS:  Current Outpatient Prescriptions  Medication Sig Dispense Refill  . apixaban (ELIQUIS) 2.5 MG TABS tablet Take 2.5 mg by mouth 2 (two) times daily. Reported on 01/01/2016    . Aspirin-Salicylamide-Caffeine (BC HEADACHE   POWDER PO) Take 1 packet by mouth daily.    . atorvastatin (LIPITOR) 40 MG tablet Take 1 tablet by mouth 1 day or 1 dose. Reported on 12/16/2015    . clindamycin (CLEOCIN) 300 MG capsule Take 1 capsule (300 mg total) by mouth 3 (three) times daily. 30 capsule 0  . diazepam (VALIUM) 5 MG tablet Take 1 tablet by mouth 2 (two) times daily as needed. Reported on 12/16/2015    . empagliflozin (JARDIANCE) 25 MG TABS tablet Take 25 mg by mouth daily.    . HYDROcodone-acetaminophen (NORCO/VICODIN) 5-325 MG tablet Take 1 tablet by mouth every 12 (twelve) hours as needed. For pain 30 tablet 0  . lidocaine-prilocaine (EMLA) cream Apply 1 application topically as needed. Apply to port a cath site 1 hour before  chemotherapy treatment. 30 g 6  . lisinopril-hydrochlorothiazide (PRINZIDE,ZESTORETIC) 20-25 MG per tablet Take 1 tablet by mouth daily.     . naproxen (NAPROSYN) 500 MG tablet Take 1 tablet by mouth 2 (two) times daily. Reported on 11/03/2015    . ondansetron (ZOFRAN) 8 MG tablet Take 1 tablet (8 mg total) by mouth every 8 (eight) hours as needed for nausea or vomiting (start 3 days; after chemo). 40 tablet 0  . phenytoin (DILANTIN) 100 MG ER capsule Take 300 mg by mouth daily.     . pioglitazone (ACTOS) 45 MG tablet Take 1 tablet by mouth daily.    . potassium chloride SA (K-DUR,KLOR-CON) 20 MEQ tablet Take 2 tablets (40 mEq total) by mouth 2 (two) times daily. 30 tablet 3  . VENTOLIN HFA 108 (90 Base) MCG/ACT inhaler Reported on 01/20/2016    . nitroGLYCERIN (NITROSTAT) 0.4 MG SL tablet Place 0.4 mg under the tongue every 5 (five) minutes as needed for chest pain. Reported on 12/16/2015    . prochlorperazine (COMPAZINE) 10 MG tablet Take 1 tablet (10 mg total) by mouth every 6 (six) hours as needed for nausea or vomiting. (Patient not taking: Reported on 04/13/2016) 40 tablet 0   No current facility-administered medications for this visit.    Facility-Administered Medications Ordered in Other Visits  Medication Dose Route Frequency Provider Last Rate Last Dose  . albuterol (PROVENTIL) (2.5 MG/3ML) 0.083% nebulizer solution 2.5 mg  2.5 mg Nebulization Once Timothy Oaks, MD      . sodium chloride flush (NS) 0.9 % injection 10 mL  10 mL Intravenous PRN [name] [name], MD   10 mL at 12/01/15 0850    PHYSICAL EXAMINATION: ECOG PERFORMANCE STATUS: 0 - Asymptomatic  BP 119/77 (BP Location: Left Arm, Patient Position: Sitting)   Pulse 81   Temp 97.5 F (36.4 C) (Tympanic)   Resp 18   Ht 5' 5" (1.651 m)   Wt 183 lb 3.2 oz (83.1 kg)   BMI 30.49 kg/m   Filed Weights   04/13/16 0929  Weight: 183 lb 3.2 oz (83.1 kg)    GENERAL: Well-nourished well-developed; Alert, no distress and  comfortable. Alone;  EYES: no pallor or icterus OROPHARYNX: no thrush or ulceration; right lower jaw molar- tenderness; no pus noted.  NECK: supple, no masses felt LYMPH: no palpable lymphadenopathy in the cervical, axillary or inguinal regions LUNGS: clear to auscultation and No wheeze or crackles HEART/CVS: regular rate & rhythm and no murmurs; No lower extremity edema ABDOMEN:abdomen soft, non-tender and normal bowel sounds Musculoskeletal:no cyanosis of digits and no clubbing  PSYCH: alert & oriented x 3 with fluent speech NEURO: no focal motor/sensory deficits SKIN: no rashes or   significant lesions; right breast mass continued improvement noted [~3-4 cm 9'0 clock- exam in presence of nurse]    LABORATORY DATA:  I have reviewed the data as listed    Component Value Date/Time   NA 137 04/13/2016 0910   NA 141 08/03/2014 0050   K 3.6 04/13/2016 0910   K 3.3 (L) 08/03/2014 0050   CL 101 04/13/2016 0910   CL 108 (H) 08/03/2014 0050   CO2 29 04/13/2016 0910   CO2 27 08/03/2014 0050   GLUCOSE 233 (H) 04/13/2016 0910   GLUCOSE 164 (H) 08/03/2014 0050   BUN 27 (H) 04/13/2016 0910   BUN 14 08/03/2014 0050   CREATININE 1.10 (H) 04/13/2016 0910   CREATININE 0.68 08/03/2014 0050   CALCIUM 8.6 (L) 04/13/2016 0910   CALCIUM 8.2 (L) 08/03/2014 0050   PROT 7.1 04/13/2016 0910   PROT 6.7 04/11/2013 2241   ALBUMIN 3.5 04/13/2016 0910   ALBUMIN 2.7 (L) 04/11/2013 2241   AST 17 04/13/2016 0910   AST 13 (L) 04/11/2013 2241   ALT 17 04/13/2016 0910   ALT 16 04/11/2013 2241   ALKPHOS 200 (H) 04/13/2016 0910   ALKPHOS 207 (H) 04/11/2013 2241   BILITOT 0.3 04/13/2016 0910   BILITOT 0.1 (L) 04/11/2013 2241   GFRNONAA 52 (L) 04/13/2016 0910   GFRNONAA >60 08/03/2014 0050   GFRNONAA >60 04/11/2013 2241   GFRAA >60 04/13/2016 0910   GFRAA >60 08/03/2014 0050   GFRAA >60 04/11/2013 2241    No results found for: SPEP, UPEP  Lab Results  Component Value Date   WBC 7.4 04/13/2016    NEUTROABS 4.8 04/13/2016   HGB 12.1 04/13/2016   HCT 36.9 04/13/2016   MCV 94.5 04/13/2016   PLT 160 04/13/2016      Chemistry      Component Value Date/Time   NA 137 04/13/2016 0910   NA 141 08/03/2014 0050   K 3.6 04/13/2016 0910   K 3.3 (L) 08/03/2014 0050   CL 101 04/13/2016 0910   CL 108 (H) 08/03/2014 0050   CO2 29 04/13/2016 0910   CO2 27 08/03/2014 0050   BUN 27 (H) 04/13/2016 0910   BUN 14 08/03/2014 0050   CREATININE 1.10 (H) 04/13/2016 0910   CREATININE 0.68 08/03/2014 0050      Component Value Date/Time   CALCIUM 8.6 (L) 04/13/2016 0910   CALCIUM 8.2 (L) 08/03/2014 0050   ALKPHOS 200 (H) 04/13/2016 0910   ALKPHOS 207 (H) 04/11/2013 2241   AST 17 04/13/2016 0910   AST 13 (L) 04/11/2013 2241   ALT 17 04/13/2016 0910   ALT 16 04/11/2013 2241   BILITOT 0.3 04/13/2016 0910   BILITOT 0.1 (L) 04/11/2013 2241    12/26/2015:   Ultrasound targeted to the right breast at 11 o'clock, 8 cm from the nipple demonstrates again demonstrates the irregular hypoechoic mass which measures approximately 2.2 x 1.2 x 1.3 cm, previously 3.7 x 1.5 x 2.7 cm in radial and anti radial planes. A few small distance satellites can still be seen, with representative images taken of a 6 mm satellite approximately 2.5 cm closer to the nipple than the medial margin of the mass and a few small satellites approximately 3 cm more distant from the nipple from the margin of the mass.    ASSESSMENT & PLAN:   Cancer of overlapping sites of right female breast (HCC) # Breast Ca- ER/PR- Neg; Her2-NEG. T3N0- STAGE II; triple negative- on neoadjuvant chemo;Status post 12 weekly treatments   of carbotaxol; partial response noted on ultrasound. Currently on Adriamycin Cytoxan status post cycle # 3.   # Proceed with cycle #4  of Adriamycin Cytoxan every 3 weeks. Labs are adequate.  # Chronic pain- continue hydrocodone as needed.  # hypokalemia- potassium 3.6- normal.   # follow up with  Dr.Loflin asap- for surgical evaluation.   # follow up with me in 3 weeks/labs.      Cammie Sickle, MD 04/13/2016 10:08 AM

## 2016-04-13 NOTE — Progress Notes (Signed)
Hydrocodone rx renewed per v/o [name]

## 2016-04-13 NOTE — Progress Notes (Signed)
Pt reports a tiredness in her legs and burning at port site.  No redness or irritation noted.

## 2016-04-13 NOTE — Progress Notes (Signed)
RN Chaperoned provider with Breast Exam.   

## 2016-04-13 NOTE — Telephone Encounter (Signed)
Patient called back and Ami was able to schedule her a follow up appointment with Dr. Azalee Course on 05/04/2016 at 8:00 AM per Dr. Azalee Course.

## 2016-04-13 NOTE — Telephone Encounter (Signed)
Called patient and had to leave her a voicemail to return my call.

## 2016-04-13 NOTE — Assessment & Plan Note (Addendum)
#  Breast Ca- ER/PR- Neg; Her2-NEG. T3N0- STAGE II; triple negative- on neoadjuvant chemo;Status post 12 weekly treatments of carbotaxol; partial response noted on ultrasound. Currently on Adriamycin Cytoxan status post cycle # 3.   # Proceed with cycle #4  of Adriamycin Cytoxan every 3 weeks. Labs are adequate.  # Chronic pain- continue hydrocodone as needed.  # hypokalemia- potassium 3.6- normal.   # follow up with Dr.Loflin asap- for surgical evaluation.   # follow up with me in 3 weeks/labs.

## 2016-04-16 ENCOUNTER — Encounter: Payer: Self-pay | Admitting: *Deleted

## 2016-04-16 NOTE — Progress Notes (Signed)
  Oncology Nurse Navigator Documentation  Navigator Location: [facility] (04/16/16 1400) Navigator Encounter Type: Telephone (04/16/16 1400) Telephone: Incoming Call (04/16/16 1400)             Barriers/Navigation Needs: Education (04/16/16 1400) Education: Pain/ Symptom Management (04/16/16 1400)       Education Method: Verbal (04/16/16 1400)                Time Spent with Patient: 30 (04/16/16 1400)   Patient called with questions regarding expectations of her upcoming surgical appointment.  Reviewed to expect a physical examination and discussion of surgical options.  She also states she is constipated.  Encouraged to increase her water intake, try prunes and roughage.  She states mild of magnesia had helped before.  Encouraged to use that if that works for her.  She is to call if she has any questions or needs.

## 2016-04-20 ENCOUNTER — Telehealth: Payer: Self-pay | Admitting: Internal Medicine

## 2016-04-20 NOTE — Telephone Encounter (Signed)
She would like Dawn Allen to call her anytime this week to give her a little more info about what to expect from her visit with Dr. Azalee Course. She said she feels scared.

## 2016-04-20 NOTE — Telephone Encounter (Signed)
RN contacted patient back.  RN reassured her about her upcoming apt with Dr. Azalee Course.  She is very nervous and wanted to know what to expect on the day of the appointment. "Will she examine my breast and is this medically necessary? You know how I feel about breast exams. I just do not like them."     I told her that Dr. Azalee Course will do a breast exam on the day of her visit. I explained to her that this is medically necessary so that the doctor can have a baseline exam prior to surgery.  I explained that Dr. Azalee Course needs to measure and know how much the tumor has shrunk in the breast.    I also said that she would talk to the doctor to personally discuss the risk and benefits of the procedure, length of hospital stay.  I explained that Dr. Azalee Course will answer any questions she as a patient may have about the surgery. I also mentioned that Dr. Azalee Course will consent the patient for the upcoming procedure.  She will discuss the date of the procedure and set up a potential preop date/time.   Ms. Bayer asked if blood work will be done on that day. I told her that that would be deferred to Dr. Azalee Course, but sometimes blood work could be necessary if she preops on the same day as the office visit.  She gave verbal understanding. Stated that this information was reassuring to her. She appreciated the call back.  Approximately 15 mins spent on the phone discussing patient's concerns about her appointment with the surgeon.

## 2016-04-30 ENCOUNTER — Telehealth: Payer: Self-pay

## 2016-04-30 NOTE — Telephone Encounter (Signed)
  Oncology Nurse Navigator Documentation  Navigator Location: [facility] (04/30/16 1100) Navigator Encounter Type: Telephone (04/30/16 1100) Telephone: Outgoing Call (04/30/16 1100)                                        Time Spent with Patient: 15 (04/30/16 1100)   Received call from [place] regarding further assistacne with getting a PCP. She cancelled previous appt she had with [facility] on [address] and has lost phone number as well as her medicare/medicaid cards. Provided her with number and instructed her to call and ask regarding not having her cards as she may have to get new ones before appt arrival.

## 2016-05-03 ENCOUNTER — Telehealth: Payer: Self-pay | Admitting: *Deleted

## 2016-05-03 NOTE — Telephone Encounter (Addendum)
Rn contacted patient per v/o [name]. md requested to cnl his md apt in [place] and only sch. A lab only in [place] s/pt apt with Dr. Caesar Chestnut.  I discussed this with the patient.  She immediately expressed "distress of cnl the apt with Dr. B. I need to see him."  She became tearful with RN and crying hysterically on phone.   Pt asked if Dr. Caesar Chestnut will discuss a mastectomy or a lumpectomy tomorrow with her. I explained that this surgical appointment is to discuss surgery options after her chemotherapy. "well, am I going to loose my breast. If so, no one told me about this.  Dr. B never discussed this with me. I can't believe this.  You just don't know how"  Pt became very tearful and cried hysterically on the phone with RN. "I'm just not going to go. I just can't handle this news. I just don't want to go. You can cnl all my appointments. I'm never coming back to the cancer center. You can't make me come."     Rn provided 35 mins of effective listening and explore pt's fears of keeping this apt.   Pt states that she has no support system in the home. "I don't want anyone to know that I'm loosing my breast. I fear how I'm going to look. I just can't handle this emotionally.  You don't know my living conditions and my neighborhood and how people judge and response to people having their breast removed."  Pt reassured that the things that she is concerned about are 'normal feelings' and 'valid concerns' that breast cancer patients go through grief process r/t to breast surgery.  Pt again replied, "I just don't want to loose my breast. I'm just not sure about keeping my appointments."  I encouraged pt to keep all of her appointments. I explained that [name] would like the patient to hear all of her options.  I explained that Dr. Caesar Chestnut will provide her clinical/ surgical recommendations.  I reassured pt that she has had an excellent response to her chemo treatment as demonstrated by her last u/s.   I explained that her breast cancer had decreased in size. The goal with surgery is to remove any remaining cancer in her breast.     Pt then stated, "I just want more chemo then. I'm just don't want to have surgery. I've already lost of my hair, Trystin Hargrove and now this. I just don't want to loose my breast. Is there anyway that she can only remove a portion of the abnormal tissue and not the entire breast?"   I again reiterated that I can not confirm whether Dr. Azalee Course would do a lumpectomy vs mastectomy. I deferred this question to the surgeon.  I again, explained that she has had a remarkable response to her chemotherapy.    I explained that at this time, [name] will not be giving her chemotherapy. The next step is to receive a surgical recommendation, which she is scheduled to do so tomorrow. She inquired if a navigator nurse would be present for her appointment tomorrow.  I told her that I would definitely ask one of the breast navigators or members of the navigation team to be present for this appointment.  "I just need someone to hold my hand during this discussion. I will go to the appointment. I just really want to see Dr. Yevette Edwards after this appointment and I really need my refill on my pain medication. Please keep  my appointment with Dr. Jacinto Reap.  I don't have any left. I've been taking BC powders to help with my pain around my port. I take these like eating candy."   I advised patient per previous Dr. Aletha Halim instructions not to use the [facility] powders due to increase risk of bleeding.  She gave verbal understanding of not "taking the bc powder."   I explained at her appointment with Dr. Jacinto Reap, MD will discuss the prescribing of ongoing narcotics and will make a decision at that time whether to renew her narcotics.   Pt stated, "thank you for listening. Just keep my appointments and I will come as scheduled."

## 2016-05-04 ENCOUNTER — Ambulatory Visit: Payer: Medicare Other | Admitting: Internal Medicine

## 2016-05-04 ENCOUNTER — Encounter: Payer: Self-pay | Admitting: Surgery

## 2016-05-04 ENCOUNTER — Other Ambulatory Visit: Payer: Medicare Other

## 2016-05-04 ENCOUNTER — Inpatient Hospital Stay: Payer: Medicare Other | Attending: Internal Medicine

## 2016-05-04 ENCOUNTER — Inpatient Hospital Stay (HOSPITAL_BASED_OUTPATIENT_CLINIC_OR_DEPARTMENT_OTHER): Payer: Medicare Other | Admitting: Internal Medicine

## 2016-05-04 ENCOUNTER — Ambulatory Visit (INDEPENDENT_AMBULATORY_CARE_PROVIDER_SITE_OTHER): Payer: Medicare Other | Admitting: Surgery

## 2016-05-04 VITALS — BP 128/64 | HR 84 | Temp 97.7°F | Resp 18 | Ht 65.0 in | Wt 183.0 lb

## 2016-05-04 VITALS — BP 149/93 | HR 96 | Temp 98.3°F | Wt 183.0 lb

## 2016-05-04 DIAGNOSIS — G8928 Other chronic postprocedural pain: Secondary | ICD-10-CM

## 2016-05-04 DIAGNOSIS — E876 Hypokalemia: Secondary | ICD-10-CM

## 2016-05-04 DIAGNOSIS — E119 Type 2 diabetes mellitus without complications: Secondary | ICD-10-CM | POA: Diagnosis not present

## 2016-05-04 DIAGNOSIS — I10 Essential (primary) hypertension: Secondary | ICD-10-CM | POA: Insufficient documentation

## 2016-05-04 DIAGNOSIS — C50811 Malignant neoplasm of overlapping sites of right female breast: Secondary | ICD-10-CM

## 2016-05-04 DIAGNOSIS — G40909 Epilepsy, unspecified, not intractable, without status epilepticus: Secondary | ICD-10-CM | POA: Diagnosis not present

## 2016-05-04 DIAGNOSIS — I2510 Atherosclerotic heart disease of native coronary artery without angina pectoris: Secondary | ICD-10-CM | POA: Insufficient documentation

## 2016-05-04 DIAGNOSIS — I252 Old myocardial infarction: Secondary | ICD-10-CM | POA: Diagnosis not present

## 2016-05-04 DIAGNOSIS — G8929 Other chronic pain: Secondary | ICD-10-CM | POA: Insufficient documentation

## 2016-05-04 DIAGNOSIS — Z171 Estrogen receptor negative status [ER-]: Secondary | ICD-10-CM | POA: Diagnosis not present

## 2016-05-04 DIAGNOSIS — K5900 Constipation, unspecified: Secondary | ICD-10-CM

## 2016-05-04 DIAGNOSIS — R419 Unspecified symptoms and signs involving cognitive functions and awareness: Secondary | ICD-10-CM | POA: Diagnosis not present

## 2016-05-04 DIAGNOSIS — Z85850 Personal history of malignant neoplasm of thyroid: Secondary | ICD-10-CM | POA: Insufficient documentation

## 2016-05-04 DIAGNOSIS — E785 Hyperlipidemia, unspecified: Secondary | ICD-10-CM

## 2016-05-04 DIAGNOSIS — Z86718 Personal history of other venous thrombosis and embolism: Secondary | ICD-10-CM | POA: Diagnosis not present

## 2016-05-04 DIAGNOSIS — Z87891 Personal history of nicotine dependence: Secondary | ICD-10-CM | POA: Diagnosis not present

## 2016-05-04 DIAGNOSIS — Z79899 Other long term (current) drug therapy: Secondary | ICD-10-CM | POA: Insufficient documentation

## 2016-05-04 DIAGNOSIS — C50911 Malignant neoplasm of unspecified site of right female breast: Secondary | ICD-10-CM

## 2016-05-04 DIAGNOSIS — C3411 Malignant neoplasm of upper lobe, right bronchus or lung: Secondary | ICD-10-CM | POA: Insufficient documentation

## 2016-05-04 DIAGNOSIS — Z7901 Long term (current) use of anticoagulants: Secondary | ICD-10-CM | POA: Insufficient documentation

## 2016-05-04 LAB — COMPREHENSIVE METABOLIC PANEL
ALT: 13 U/L — AB (ref 14–54)
AST: 14 U/L — ABNORMAL LOW (ref 15–41)
Albumin: 3.5 g/dL (ref 3.5–5.0)
Alkaline Phosphatase: 224 U/L — ABNORMAL HIGH (ref 38–126)
Anion gap: 10 (ref 5–15)
BUN: 23 mg/dL — ABNORMAL HIGH (ref 6–20)
CHLORIDE: 97 mmol/L — AB (ref 101–111)
CO2: 29 mmol/L (ref 22–32)
CREATININE: 0.78 mg/dL (ref 0.44–1.00)
Calcium: 9 mg/dL (ref 8.9–10.3)
Glucose, Bld: 183 mg/dL — ABNORMAL HIGH (ref 65–99)
Potassium: 3.4 mmol/L — ABNORMAL LOW (ref 3.5–5.1)
Sodium: 136 mmol/L (ref 135–145)
Total Bilirubin: <0.1 mg/dL — ABNORMAL LOW (ref 0.3–1.2)
Total Protein: 7.5 g/dL (ref 6.5–8.1)

## 2016-05-04 LAB — CBC WITH DIFFERENTIAL/PLATELET
Basophils Absolute: 0 10*3/uL (ref 0–0.1)
Basophils Relative: 1 %
EOS PCT: 2 %
Eosinophils Absolute: 0.2 10*3/uL (ref 0–0.7)
HCT: 38.2 % (ref 35.0–47.0)
Hemoglobin: 12.3 g/dL (ref 12.0–16.0)
LYMPHS ABS: 1.4 10*3/uL (ref 1.0–3.6)
LYMPHS PCT: 18 %
MCH: 29.7 pg (ref 26.0–34.0)
MCHC: 32.1 g/dL (ref 32.0–36.0)
MCV: 92.5 fL (ref 80.0–100.0)
MONO ABS: 0.8 10*3/uL (ref 0.2–0.9)
Monocytes Relative: 11 %
Neutro Abs: 5.3 10*3/uL (ref 1.4–6.5)
Neutrophils Relative %: 68 %
PLATELETS: 176 10*3/uL (ref 150–440)
RBC: 4.13 MIL/uL (ref 3.80–5.20)
RDW: 17.6 % — AB (ref 11.5–14.5)
WBC: 7.7 10*3/uL (ref 3.6–11.0)

## 2016-05-04 MED ORDER — HYDROCODONE-ACETAMINOPHEN 5-325 MG PO TABS: 1.0000 | ORAL_TABLET | Freq: Two times a day (BID) | ORAL | 0 refills | Status: DC | PRN

## 2016-05-04 NOTE — Patient Instructions (Signed)
Angie will be calling you with a surgery date and Office Pre-op appointment.   Remember to stop taking your Eliquis three days before your surgery.  If you have any questions prior to your surgery, please give [place] a call.

## 2016-05-04 NOTE — Progress Notes (Addendum)
Subjective:     Patient ID: Dawn Allen, female   DOB: 04-19-1951, 65 y.o.   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 She has undergone 16 treatments of chemotherapy but was able to tolerate them. She has been doing well overall but she is very concerned about loosing her breast.  She does not have a strong family support system and even though she goes to church she has not felt comfortable to confide in anyone but one friend that she has cancer. She states that she is tired and has now lost her hair but otherwise doing well.   Past Medical History:  Diagnosis Date  . Anxiety   . Breast cancer ([place]) 08/2015   right breast, chemo  . Cancer of right female breast ([place])   . Coronary artery disease    a. NSTEMI cath 08/02/14: LM nl, pLAD 50%, mLCx 30%, mRCA 95% s/p PCI/DES, 2nd lesion 40%, EF 55%  . Diabetes mellitus without complication ([place])   . DVT (deep venous thrombosis) ([facility])    LEFT LEG   . HLD (hyperlipidemia)   . Hypertension   . Lung nodule   . MI (myocardial infarction) ([place])   . Seizure disorder ([place])   . Seizures ([place])   . Squamous cell lung cancer ([place]) 04/23/2015   Past Surgical History:  Procedure Laterality Date  . ABDOMINAL HYSTERECTOMY     PARTIAL   . BREAST BIOPSY Right 09/15/2015   positve  . CARDIAC CATHETERIZATION  08/02/2014  . CORONARY ANGIOPLASTY  08/02/2014   drug eluting stent placement  . IVC FILTER PLACEMENT ([facility] HX)    . LEG SURGERY Right    BLOOD CLOT  . PORTACATH PLACEMENT Left 10/13/2015   Procedure: INSERTION PORT-A-CATH;  Surgeon: Hubbard Robinson, MD;  Location: [facility] ORS;  Service: General;  Laterality: Left;   Family History  Problem Relation Age of Onset  . Heart attack Mother   . Breast cancer Neg Hx    Social History   Social History  . Marital status: Single     Spouse name: N/A  . Number of children: N/A  . Years of education: N/A   Social History Main Topics  . Smoking status: Current Every Day Smoker    Packs/day: 0.50    Years: 45.00    Types: Cigarettes  . Smokeless tobacco: Never Used  . Alcohol use No  . Drug use: No  . Sexual activity: Yes    Partners: Male   Other Topics Concern  . None   Social History Narrative  . None    Current Outpatient Prescriptions:  .  apixaban (ELIQUIS) 2.5 MG TABS tablet, Take 2.5 mg by mouth 2 (two) times daily. Reported on 01/01/2016, Disp: , Rfl:  .  Aspirin-Salicylamide-Caffeine (BC HEADACHE POWDER PO), Take 1 packet by mouth daily., Disp: , Rfl:  .  clindamycin (CLEOCIN) 300 MG capsule, Take 1 capsule (300 mg total) by mouth 3 (three) times daily., Disp: 30 capsule, Rfl: 0 .  diazepam (VALIUM) 5 MG tablet, Take 1 tablet by mouth 2 (two) times daily as needed. Reported on 12/16/2015, Disp: , Rfl:  .  empagliflozin (JARDIANCE) 25 MG TABS tablet, Take 25 mg by mouth daily., Disp: , Rfl:  .  HYDROcodone-acetaminophen (NORCO/VICODIN) 5-325 MG tablet, Take 1 tablet by mouth every 12 (twelve) hours as  needed. For pain (Patient not taking: Reported on 05/04/2016), Disp: 30 tablet, Rfl: 0 .  lidocaine-prilocaine (EMLA) cream, Apply 1 application topically as needed. Apply to port a cath site 1 hour before chemotherapy treatment., Disp: 30 g, Rfl: 6 .  lisinopril-hydrochlorothiazide (PRINZIDE,ZESTORETIC) 20-25 MG per tablet, Take 1 tablet by mouth daily. , Disp: , Rfl:  .  naproxen (NAPROSYN) 500 MG tablet, Take 1 tablet by mouth 2 (two) times daily. Reported on 11/03/2015, Disp: , Rfl:  .  nitroGLYCERIN (NITROSTAT) 0.4 MG SL tablet, Place 0.4 mg under the tongue every 5 (five) minutes as needed for chest pain. Reported on 12/16/2015, Disp: , Rfl:  .  ondansetron (ZOFRAN) 8 MG tablet, Take 1 tablet (8 mg total) by mouth every 8 (eight) hours as needed for nausea or vomiting (start 3 days; after chemo). (Patient not  taking: Reported on 05/04/2016), Disp: 40 tablet, Rfl: 0 .  phenytoin (DILANTIN) 100 MG ER capsule, Take 300 mg by mouth daily. , Disp: , Rfl:  .  pioglitazone (ACTOS) 45 MG tablet, Take 1 tablet by mouth daily., Disp: , Rfl:  .  prochlorperazine (COMPAZINE) 10 MG tablet, Take 1 tablet (10 mg total) by mouth every 6 (six) hours as needed for nausea or vomiting., Disp: 40 tablet, Rfl: 0 .  VENTOLIN HFA 108 (90 Base) MCG/ACT inhaler, Reported on 01/20/2016, Disp: , Rfl:  No current facility-administered medications for this visit.   Facility-Administered Medications Ordered in Other Visits:  .  albuterol (PROVENTIL) (2.5 MG/3ML) 0.083% nebulizer solution 2.5 mg, 2.5 mg, Nebulization, Once, Nestor Lewandowsky, MD .  sodium chloride flush (NS) 0.9 % injection 10 mL, 10 mL, Intravenous, PRN, Cammie Sickle, MD, 10 mL at 12/01/15 0850 Allergies  Allergen Reactions  . Penicillins     Patient denies allergy      Review of Systems  Constitutional: Positive for activity change, appetite change and fatigue. Negative for chills, fever and unexpected weight change.  HENT: Negative for congestion and sore throat.   Respiratory: Negative for cough, chest tightness and shortness of breath.   Cardiovascular: Negative for chest pain, palpitations and leg swelling.  Gastrointestinal: Negative for abdominal pain, diarrhea, nausea and vomiting.  Genitourinary: Negative for dysuria.  Musculoskeletal: Negative for back pain and neck pain.  Skin: Negative for color change, pallor, rash and wound.  Neurological: Negative for dizziness and weakness.  Hematological: Negative for adenopathy. Does not bruise/bleed easily.  Psychiatric/Behavioral: Negative for agitation. The patient is nervous/anxious.   All other systems reviewed and are negative.      Objective:   Physical Exam  Constitutional: She is oriented to person, place, and time. She appears well-developed and well-nourished. No distress.  HENT:   Head: Normocephalic and atraumatic.  Right Ear: External ear normal.  Left Ear: External ear normal.  Nose: Nose normal.  Mouth/Throat: Oropharynx is clear and moist. No oropharyngeal exudate.  Eyes: Conjunctivae and EOM are normal. Pupils are equal, round, and reactive to light. No scleral icterus.  Neck: Normal range of motion. Neck supple. No tracheal deviation present. Thyromegaly present.  Cardiovascular: Normal rate, regular rhythm, normal heart sounds and intact distal pulses.  Exam reveals no gallop and no friction rub.   No murmur heard. Pulmonary/Chest: Effort normal and breath sounds normal. No respiratory distress. She has no wheezes. She has no rales.  Right breast with 4 cm mass in the upper outer portion that is palpable but mobile and corresponds with mass on CT scan.  Abdominal: Soft. Bowel  sounds are normal. She exhibits no distension. There is no tenderness. There is no rebound and no guarding.  Musculoskeletal: Normal range of motion. She exhibits no edema, tenderness or deformity.  Neurological: She is alert and oriented to person, place, and time. No cranial nerve deficit.  Skin: Skin is warm and dry. No rash noted. No erythema. No pallor.  Psychiatric: She has a normal mood and affect. Her behavior is normal. Judgment and thought content normal.  Vitals reviewed.      CBC Latest Ref Rng & Units 05/04/2016 04/13/2016 03/16/2016  WBC 3.6 - 11.0 K/uL 7.7 7.4 9.0  Hemoglobin 12.0 - 16.0 g/dL 12.3 12.1 11.4(L)  Hematocrit 35.0 - 47.0 % 38.2 36.9 34.5(L)  Platelets 150 - 440 K/uL 176 160 169   CMP Latest Ref Rng & Units 04/13/2016 03/16/2016 02/25/2016  Glucose 65 - 99 mg/dL 233(H) 197(H) 231(H)  BUN 6 - 20 mg/dL 27(H) 22(H) 20  Creatinine 0.44 - 1.00 mg/dL 1.10(H) 0.74 0.90  Sodium 135 - 145 mmol/L 137 135 136  Potassium 3.5 - 5.1 mmol/L 3.6 3.2(L) 3.6  Chloride 101 - 111 mmol/L 101 98(L) 99(L)  CO2 22 - 32 mmol/L '29 28 28  '$ Calcium 8.9 - 10.3 mg/dL 8.6(L) 8.6(L) 8.8(L)   Total Protein 6.5 - 8.1 g/dL 7.1 7.1 -  Total Bilirubin 0.3 - 1.2 mg/dL 0.3 0.4 -  Alkaline Phos 38 - 126 U/L 200(H) 200(H) -  AST 15 - 41 U/L 17 14(L) -  ALT 14 - 54 U/L 17 15 -   U/S: Ultrasound targeted to the right breast at 11 o'clock, 8 cm from the nipple demonstrates again demonstrates the irregular hypoechoic mass which measures approximately 2.2 x 1.2 x 1.3 cm, previously 3.7 x 1.5 x 2.7 cm in radial and anti radial planes. A few small distance satellites can still be seen, with representative images taken of a 6 mm satellite approximately 2.5 cm closer to the nipple than the medial margin of the mass and a few small satellites approximately 3 cm more distant from the nipple from the margin of the mass.  Assessment:     65 yr old female with triple negative right breast cancer    Plan:     I discussed the available options with the patient which included mastectomy verse lumpectomy.  She is addament that she does not want a mastectomy even if it will not be cosmetically pleasing.  The risk of recurrence is similar between mastectomy and lumpectomy with radiation. The patient is willing to go through the radiation if she can keep her breast.  I also discussed that we would need to do a sentinel lymph node biopsy to check the nodes.    I discussed risks of bleeding, infection, damage to surrounding tissues, having positive margins, needing further resection, damage to nerves causing arm numbness or difficulty raising arm, causing lymphoedema in the arm; as well as anesthesia risks of MI, stroke, prolonged ventilation, pulmonary embolism, thrombosis and even death. Patient was given the opportunity to ask questions and have them answered. She would like to proceed with Right breast partial mastectomy with sentinel lymph node biopsy.  She was instructed that she would be off the Eliquis 3 days prior to surgery.

## 2016-05-04 NOTE — Addendum Note (Signed)
Addended by: Hubbard Robinson on: 05/04/2016 10:52 AM   Modules accepted: Orders, SmartSet

## 2016-05-04 NOTE — Assessment & Plan Note (Addendum)
#  Breast Ca- ER/PR- Neg; Her2-NEG. T3N0- STAGE II; triple negative- on neoadjuvant chemo;Status post 12 weekly treatments of carbotaxol; partial response noted on ultrasound. Currently on Adriamycin Cytoxan status post cycle # 4- approximately 3 weeks ago. Patient's tumor approximately 3-4 cm in size. Patient interested in lumpectomy; wants to avoid mastectomy.she understands that she will likely need radiation.    # s/p #4 Adriamycin Cytoxan- [last 8/22nd]. Labs are normal.   # Chronic pain- continue hydrocodone as needed.  # hypokalemia- potassium 3.6- normal.   # follow up with me in 3 week of October 2017.

## 2016-05-04 NOTE — Progress Notes (Signed)
Pt had her appointment with Dr. Azalee Course this morning. Pt very anxious upon arrival to cancer center. Reassurance provided.  Pt has no additional medical complaints today.  She is requesting a RF on the narcotics due to sore port a cath site.  Pt taking bc powder after instructed not to do so by provider. "it's the only thing that relieved my pain as I am out of the pain medication."

## 2016-05-04 NOTE — Progress Notes (Signed)
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   # Smoker; ? Paranoia; Feb 2017- MUGA scan- 67%     Cancer of overlapping sites of right female breast ([facility]   02/17/2016 Initial Diagnosis    Cancer of overlapping sites of right female breast ([facility]       INTERVAL HISTORY: Poor- vague historian  65year old female patient with above history of newly diagnosed breast cancer- triple negative; clinical stage II- currently on neo-adjuvant; status post weekly carbo-taxol x 12- is here for follow-up/ s/p cycle #4 of Adriamycin Cytoxan 4 weeks ago.   Patient this morning has met with surgery for planning for surgical options.   She continues to take hydrocodone one pill twice a day as needed for chest wall pain from her port.  Complains of constipation; on dulcolax. Otherwise patient denies any unusual shortness of breath or chest pain or cough.   REVIEW OF SYSTEMS: Difficult to assess given as patient is a poor historian.  PAST MEDICAL HISTORY :  Past Medical History:  Diagnosis Date  . Anxiety   . Breast cancer ([facility] 08/2015   right breast, chemo  . Cancer of right female breast ([facility]   . Coronary artery disease    a. NSTEMI cath 08/02/14: LM nl, pLAD 50%, mLCx 30%, mRCA 95% s/p PCI/DES, 2nd lesion 40%, EF 55%  . Diabetes mellitus without complication ([facility]   . DVT (deep venous thrombosis) (HCC)    LEFT LEG   . HLD (hyperlipidemia)   . Hypertension   . Lung  nodule   . MI (myocardial infarction) ([facility]   . Seizure disorder ([facility]   . Seizures ([facility]   . Squamous cell lung cancer ([facility] 04/23/2015    PAST SURGICAL HISTORY :   Past Surgical History:  Procedure Laterality Date  . ABDOMINAL HYSTERECTOMY     PARTIAL   . BREAST BIOPSY Right 09/15/2015   positve  . CARDIAC CATHETERIZATION  08/02/2014  . CORONARY ANGIOPLASTY  08/02/2014   drug eluting stent placement  . IVC FILTER PLACEMENT ([facility] HX)    . LEG SURGERY Right    BLOOD CLOT  . PORTACATH PLACEMENT Left 10/13/2015   Procedure: INSERTION PORT-A-CATH;  Surgeon: CHubbard Robinson MD;  Location: [facility] ORS;  Service: General;  Laterality: Left;    FAMILY HISTORY :   Family History  Problem Relation Age of Onset  . Heart attack Mother   . Breast cancer Neg Hx     SOCIAL HISTORY:   Social History  Substance Use Topics  . Smoking status: Current Every Day Smoker    Packs/day: 0.50    Years: 45.00    Types: Cigarettes  . Smokeless tobacco: Never Used  . Alcohol use No    ALLERGIES:  is allergic to penicillins.  MEDICATIONS:  Current Outpatient Prescriptions  Medication Sig Dispense Refill  . apixaban (ELIQUIS) 2.5 MG TABS tablet Take 2.5 mg by mouth 2 (two)  times daily. Reported on 01/01/2016    . Aspirin-Salicylamide-Caffeine (BC HEADACHE POWDER PO) Take 1 packet by mouth daily.    . clindamycin (CLEOCIN) 300 MG capsule Take 1 capsule (300 mg total) by mouth 3 (three) times daily. 30 capsule 0  . diazepam (VALIUM) 5 MG tablet Take 1 tablet by mouth 2 (two) times daily as needed. Reported on 12/16/2015    . empagliflozin (JARDIANCE) 25 MG TABS tablet Take 25 mg by mouth daily.    Marland Kitchen lidocaine-prilocaine (EMLA) cream Apply 1 application topically as needed. Apply to port a cath site 1 hour before chemotherapy treatment. 30 g 6  . lisinopril-hydrochlorothiazide (PRINZIDE,ZESTORETIC) 20-25 MG per tablet Take 1 tablet by mouth daily.     . naproxen (NAPROSYN) 500 MG tablet Take 1  tablet by mouth 2 (two) times daily. Reported on 11/03/2015    . phenytoin (DILANTIN) 100 MG ER capsule Take 300 mg by mouth daily.     . pioglitazone (ACTOS) 45 MG tablet Take 1 tablet by mouth daily.    . prochlorperazine (COMPAZINE) 10 MG tablet Take 1 tablet (10 mg total) by mouth every 6 (six) hours as needed for nausea or vomiting. 40 tablet 0  . VENTOLIN HFA 108 (90 Base) MCG/ACT inhaler Reported on 01/20/2016    . HYDROcodone-acetaminophen (NORCO/VICODIN) 5-325 MG tablet Take 1 tablet by mouth every 12 (twelve) hours as needed. For pain 30 tablet 0  . nitroGLYCERIN (NITROSTAT) 0.4 MG SL tablet Place 0.4 mg under the tongue every 5 (five) minutes as needed for chest pain. Reported on 12/16/2015    . ondansetron (ZOFRAN) 8 MG tablet Take 1 tablet (8 mg total) by mouth every 8 (eight) hours as needed for nausea or vomiting (start 3 days; after chemo). (Patient not taking: Reported on 05/04/2016) 40 tablet 0   No current facility-administered medications for this visit.    Facility-Administered Medications Ordered in Other Visits  Medication Dose Route Frequency Provider Last Rate Last Dose  . albuterol (PROVENTIL) (2.5 MG/3ML) 0.083% nebulizer solution 2.5 mg  2.5 mg Nebulization Once Nestor Lewandowsky, MD      . sodium chloride flush (NS) 0.9 % injection 10 mL  10 mL Intravenous PRN Cammie Sickle, MD   10 mL at 12/01/15 0850    PHYSICAL EXAMINATION: ECOG PERFORMANCE STATUS: 0 - Asymptomatic  BP 128/64 (BP Location: Right Arm, Patient Position: Sitting)   Pulse 84   Temp 97.7 F (36.5 C) (Tympanic)   Resp 18   Ht '5\' 5"'  (1.651 m)   Wt 183 lb (83 kg)   BMI 30.45 kg/m   Filed Weights   05/04/16 1022  Weight: 183 lb (83 kg)    GENERAL: Well-nourished well-developed; Alert, no distress and comfortable. Alone;  EYES: no pallor or icterus OROPHARYNX: no thrush or ulceration; right lower jaw molar- tenderness; no pus noted.  NECK: supple, no masses felt LYMPH: no palpable  lymphadenopathy in the cervical, axillary or inguinal regions LUNGS: clear to auscultation and No wheeze or crackles HEART/CVS: regular rate & rhythm and no murmurs; No lower extremity edema ABDOMEN:abdomen soft, non-tender and normal bowel sounds Musculoskeletal:no cyanosis of digits and no clubbing  PSYCH: alert & oriented x 3 with fluent speech NEURO: no focal motor/sensory deficits    LABORATORY DATA:  I have reviewed the data as listed    Component Value Date/Time   NA 136 05/04/2016 1009   NA 141 08/03/2014 0050   K 3.4 (L) 05/04/2016 1009   K 3.3 (  L) 08/03/2014 0050   CL 97 (L) 05/04/2016 1009   CL 108 (H) 08/03/2014 0050   CO2 29 05/04/2016 1009   CO2 27 08/03/2014 0050   GLUCOSE 183 (H) 05/04/2016 1009   GLUCOSE 164 (H) 08/03/2014 0050   BUN 23 (H) 05/04/2016 1009   BUN 14 08/03/2014 0050   CREATININE 0.78 05/04/2016 1009   CREATININE 0.68 08/03/2014 0050   CALCIUM 9.0 05/04/2016 1009   CALCIUM 8.2 (L) 08/03/2014 0050   PROT 7.5 05/04/2016 1009   PROT 6.7 04/11/2013 2241   ALBUMIN 3.5 05/04/2016 1009   ALBUMIN 2.7 (L) 04/11/2013 2241   AST 14 (L) 05/04/2016 1009   AST 13 (L) 04/11/2013 2241   ALT 13 (L) 05/04/2016 1009   ALT 16 04/11/2013 2241   ALKPHOS 224 (H) 05/04/2016 1009   ALKPHOS 207 (H) 04/11/2013 2241   BILITOT <0.1 (L) 05/04/2016 1009   BILITOT 0.1 (L) 04/11/2013 2241   GFRNONAA >60 05/04/2016 1009   GFRNONAA >60 08/03/2014 0050   GFRNONAA >60 04/11/2013 2241   GFRAA >60 05/04/2016 1009   GFRAA >60 08/03/2014 0050   GFRAA >60 04/11/2013 2241    No results found for: SPEP, UPEP  Lab Results  Component Value Date   WBC 7.7 05/04/2016   NEUTROABS 5.3 05/04/2016   HGB 12.3 05/04/2016   HCT 38.2 05/04/2016   MCV 92.5 05/04/2016   PLT 176 05/04/2016      Chemistry      Component Value Date/Time   NA 136 05/04/2016 1009   NA 141 08/03/2014 0050   K 3.4 (L) 05/04/2016 1009   K 3.3 (L) 08/03/2014 0050   CL 97 (L) 05/04/2016 1009   CL  108 (H) 08/03/2014 0050   CO2 29 05/04/2016 1009   CO2 27 08/03/2014 0050   BUN 23 (H) 05/04/2016 1009   BUN 14 08/03/2014 0050   CREATININE 0.78 05/04/2016 1009   CREATININE 0.68 08/03/2014 0050      Component Value Date/Time   CALCIUM 9.0 05/04/2016 1009   CALCIUM 8.2 (L) 08/03/2014 0050   ALKPHOS 224 (H) 05/04/2016 1009   ALKPHOS 207 (H) 04/11/2013 2241   AST 14 (L) 05/04/2016 1009   AST 13 (L) 04/11/2013 2241   ALT 13 (L) 05/04/2016 1009   ALT 16 04/11/2013 2241   BILITOT <0.1 (L) 05/04/2016 1009   BILITOT 0.1 (L) 04/11/2013 2241    12/26/2015:   Ultrasound targeted to the right breast at 11 o'clock, 8 cm from the nipple demonstrates again demonstrates the irregular hypoechoic mass which measures approximately 2.2 x 1.2 x 1.3 cm, previously 3.7 x 1.5 x 2.7 cm in radial and anti radial planes. A few small distance satellites can still be seen, with representative images taken of a 6 mm satellite approximately 2.5 cm closer to the nipple than the medial margin of the mass and a few small satellites approximately 3 cm more distant from the nipple from the margin of the mass.    ASSESSMENT & PLAN:   Cancer of overlapping sites of right female breast ([place]) # Breast Ca- ER/PR- Neg; Her2-NEG. T3N0- STAGE II; triple negative- on neoadjuvant chemo;Status post 12 weekly treatments of carbotaxol; partial response noted on ultrasound. Currently on Adriamycin Cytoxan status post cycle # 4- approximately 3 weeks ago. Patient's tumor approximately 3-4 cm in size. Patient interested in lumpectomy; wants to avoid mastectomy.she understands that she will likely need radiation.    # s/p #4 Adriamycin Cytoxan- [last 8/22nd]. Labs are  normal.   # Chronic pain- continue hydrocodone as needed.  # hypokalemia- potassium 3.6- normal.   # follow up with me in 3 week of October 2017.      Cammie Sickle, MD 05/04/2016 5:20 PM

## 2016-05-07 ENCOUNTER — Telehealth: Payer: Self-pay | Admitting: Surgery

## 2016-05-07 NOTE — Telephone Encounter (Signed)
Pt advised of pre op date/time and sx date. Sx: 05/26/16 with Dr Maren Reamer breast partial mastectomy with SN. Pre op: 05/17/16 @ 9:45am--office.   Patient was advised to arrive at 7:45am the day of surgery. She was also advised that someone will have to be with her and stay till discharge the day of surgery.   I will call the cancer center and set up Dawn Allen up for her pre op appointment.

## 2016-05-11 ENCOUNTER — Telehealth: Payer: Self-pay | Admitting: Internal Medicine

## 2016-05-11 NOTE — Telephone Encounter (Signed)
Pt lvm asking for Sharyn Lull or Mickel Baas to call and tell her what is involved with Pre-op. Mickel Baas can't return this call b/c this is a clinical question. Sharyn Lull is an infusion nurse and so this really should go to team, but given pt circumstances, wanted to make you aware that she had requested specific individuals and not sure if she will be upset with others calling her back. Thanks.

## 2016-05-12 NOTE — Telephone Encounter (Signed)
Spoke with patient - pt reassured and explained the process for preadmit testing and what to anticipate. She thanked for calling her back.

## 2016-05-17 ENCOUNTER — Encounter
Admission: RE | Admit: 2016-05-17 | Discharge: 2016-05-17 | Disposition: A | Discharge status: Home / Self Care | Payer: Medicare Other | Source: Ambulatory Visit | Attending: Surgery | Admitting: Surgery

## 2016-05-17 NOTE — Patient Instructions (Signed)
Your procedure is scheduled on: Wednesday 05/26/16 Report to Day Surgery. 2ND FLOOR MEDICAL MALL ENTRANCE To find out your arrival time please call [phone] between 1PM - 3PM on Tuesday 05/25/16.  Remember: Instructions that are not followed completely may result in serious medical risk, up to and including death, or upon the discretion of your surgeon and anesthesiologist your surgery may need to be rescheduled.    __X__ 1. Do not eat food or drink liquids after midnight. No gum chewing or hard candies.     __X__ 2. No Alcohol for 24 hours before or after surgery.   ____ 3. Bring all medications with you on the day of surgery if instructed.    __X__ 4. Notify your doctor if there is any change in your medical condition     (cold, fever, infections).     Do not wear jewelry, make-up, hairpins, clips or nail polish.  Do not wear lotions, powders, or perfumes.   Do not shave 48 hours prior to surgery. Men may shave face and neck.  Do not bring valuables to the hospital.    [facility] is not responsible for any belongings or valuables.               Contacts, dentures or bridgework may not be worn into surgery.  Leave your suitcase in the car. After surgery it may be brought to your room.  For patients admitted to the hospital, discharge time is determined by your                treatment team.   Patients discharged the day of surgery will not be allowed to drive home.   Please read over the following fact sheets that you were given:   Surgical Site Infection Prevention   __X__ Take these medicines the morning of surgery with A SIP OF WATER:    1. DILANTIN  2.   3.   4.  5.  6.  ____ Fleet Enema (as directed)   __X__ Use CHG Soap as directed  __X__ Use inhalers on the day of surgery  ____ Stop metformin 2 days prior to surgery    ____ Take 1/2 of usual insulin dose the night before surgery and none on the morning of surgery.   __X__ Stop Coumadin/Plavix/aspirin on  STOP ELOQUIS 3 DAYS BEFORE PROCEDURE  __X__ Stop Anti-inflammatories on STOP NAPROXEN TODAY   ____ Stop supplements until after surgery.    ____ Bring C-Pap to the hospital.

## 2016-05-17 NOTE — Pre-Procedure Instructions (Signed)
ANESTHESIA  ED ECG REPORT I, Doran Stabler, the attending physician, personally viewed and interpreted this ECG.   Date: 01/29/2016  EKG Time: 1706  Rate: 86  Rhythm: normal sinus rhythm  Axis: Normal axis  Intervals:none  ST&T Change: No ST segment elevation or depression. No abnormal T-wave inversion

## 2016-05-24 ENCOUNTER — Telehealth: Payer: Self-pay | Admitting: Internal Medicine

## 2016-05-24 NOTE — Telephone Encounter (Signed)
Pt has questions about stopping her Eloquis 3 days before surgery (on Wednesday). Needs an answer asap because she's about to leave the house and trying to find out before she goes. Call transferred to triage nurse.

## 2016-05-24 NOTE — Telephone Encounter (Signed)
I spoke with patient and confirmed no ELiquis Sun, Mon, Tues as per instructions form surgeon

## 2016-05-25 MED ORDER — CIPROFLOXACIN IN D5W 400 MG/200ML IV SOLN
400.0000 mg | INTRAVENOUS | Status: AC
  Administered 2016-05-26: 400 mg via INTRAVENOUS

## 2016-05-26 ENCOUNTER — Ambulatory Visit
Admission: RE | Admit: 2016-05-26 | Discharge: 2016-05-26 | Disposition: A | Discharge status: Home / Self Care | Payer: Medicare Other | Source: Ambulatory Visit | Attending: Surgery | Admitting: Surgery

## 2016-05-26 ENCOUNTER — Encounter
Admission: RE | Admit: 2016-05-26 | Discharge: 2016-05-26 | Disposition: A | Discharge status: Home / Self Care | Payer: Medicare Other | Source: Ambulatory Visit | Attending: Surgery | Admitting: Surgery

## 2016-05-26 ENCOUNTER — Encounter: Payer: Self-pay | Admitting: Anesthesiology

## 2016-05-26 ENCOUNTER — Inpatient Hospital Stay: Payer: Medicare Other | Admitting: Anesthesiology

## 2016-05-26 ENCOUNTER — Encounter
Admission: RE | Disposition: A | Discharge status: Home / Self Care | Payer: Self-pay | Source: Ambulatory Visit | Attending: Surgery

## 2016-05-26 DIAGNOSIS — I2510 Atherosclerotic heart disease of native coronary artery without angina pectoris: Secondary | ICD-10-CM | POA: Insufficient documentation

## 2016-05-26 DIAGNOSIS — E119 Type 2 diabetes mellitus without complications: Secondary | ICD-10-CM | POA: Insufficient documentation

## 2016-05-26 DIAGNOSIS — Z7984 Long term (current) use of oral hypoglycemic drugs: Secondary | ICD-10-CM | POA: Diagnosis not present

## 2016-05-26 DIAGNOSIS — C50911 Malignant neoplasm of unspecified site of right female breast: Secondary | ICD-10-CM | POA: Insufficient documentation

## 2016-05-26 DIAGNOSIS — C50011 Malignant neoplasm of nipple and areola, right female breast: Secondary | ICD-10-CM

## 2016-05-26 DIAGNOSIS — Z79899 Other long term (current) drug therapy: Secondary | ICD-10-CM | POA: Diagnosis not present

## 2016-05-26 DIAGNOSIS — I10 Essential (primary) hypertension: Secondary | ICD-10-CM | POA: Insufficient documentation

## 2016-05-26 DIAGNOSIS — F419 Anxiety disorder, unspecified: Secondary | ICD-10-CM | POA: Insufficient documentation

## 2016-05-26 DIAGNOSIS — C50811 Malignant neoplasm of overlapping sites of right female breast: Secondary | ICD-10-CM

## 2016-05-26 DIAGNOSIS — J449 Chronic obstructive pulmonary disease, unspecified: Secondary | ICD-10-CM | POA: Insufficient documentation

## 2016-05-26 DIAGNOSIS — G8928 Other chronic postprocedural pain: Secondary | ICD-10-CM

## 2016-05-26 HISTORY — PX: SENTINEL NODE BIOPSY: SHX6608

## 2016-05-26 HISTORY — PX: AXILLARY LYMPH NODE BIOPSY: SHX5737

## 2016-05-26 HISTORY — PX: MASTECTOMY, PARTIAL: SHX709

## 2016-05-26 LAB — GLUCOSE, CAPILLARY: Glucose-Capillary: 156 mg/dL — ABNORMAL HIGH (ref 65–99)

## 2016-05-26 SURGERY — MASTECTOMY PARTIAL
Anesthesia: General | Site: Breast | Laterality: Right | Wound class: Clean

## 2016-05-26 MED ORDER — PENTAFLUOROPROP-TETRAFLUOROETH EX AERO
INHALATION_SPRAY | CUTANEOUS | Status: AC
  Filled 2016-05-26: qty 30

## 2016-05-26 MED ORDER — ISOSULFAN BLUE 1 % ~~LOC~~ SOLN
SUBCUTANEOUS | Status: DC | PRN
Start: 2016-05-26 — End: 2016-05-26
  Administered 2016-05-26: 5 mg via SUBCUTANEOUS

## 2016-05-26 MED ORDER — FENTANYL CITRATE (PF) 100 MCG/2ML IJ SOLN
25.0000 ug | INTRAMUSCULAR | Status: DC | PRN
  Administered 2016-05-26 (×3): 25 ug via INTRAVENOUS

## 2016-05-26 MED ORDER — HYDROCODONE-ACETAMINOPHEN 5-325 MG PO TABS
ORAL_TABLET | ORAL | Status: AC
Start: 2016-05-26 — End: 2016-05-27
  Filled 2016-05-26: qty 1

## 2016-05-26 MED ORDER — CHLORHEXIDINE GLUCONATE CLOTH 2 % EX PADS: 6.0000 | MEDICATED_PAD | Freq: Once | CUTANEOUS | Status: DC

## 2016-05-26 MED ORDER — PHENYLEPHRINE HCL 10 MG/ML IJ SOLN
INTRAMUSCULAR | Status: DC | PRN
Start: 2016-05-26 — End: 2016-05-26
  Administered 2016-05-26: 60 ug/min via INTRAVENOUS

## 2016-05-26 MED ORDER — APIXABAN 2.5 MG PO TABS: 2.5000 mg | ORAL_TABLET | Freq: Two times a day (BID) | ORAL | 0 refills | Status: DC

## 2016-05-26 MED ORDER — ISOSULFAN BLUE 1 % ~~LOC~~ SOLN
SUBCUTANEOUS | Status: AC
  Filled 2016-05-26: qty 5

## 2016-05-26 MED ORDER — FAMOTIDINE 20 MG PO TABS
ORAL_TABLET | ORAL | Status: AC
  Administered 2016-05-26: 20 mg via ORAL
  Filled 2016-05-26: qty 1

## 2016-05-26 MED ORDER — SUCCINYLCHOLINE CHLORIDE 20 MG/ML IJ SOLN
INTRAMUSCULAR | Status: DC | PRN
  Administered 2016-05-26: 100 mg via INTRAVENOUS

## 2016-05-26 MED ORDER — CIPROFLOXACIN IN D5W 400 MG/200ML IV SOLN
INTRAVENOUS | Status: AC
  Administered 2016-05-26: 400 mg via INTRAVENOUS
  Filled 2016-05-26: qty 200

## 2016-05-26 MED ORDER — ONDANSETRON HCL 4 MG/2ML IJ SOLN
INTRAMUSCULAR | Status: DC | PRN
  Administered 2016-05-26: 4 mg via INTRAVENOUS

## 2016-05-26 MED ORDER — FENTANYL CITRATE (PF) 100 MCG/2ML IJ SOLN
INTRAMUSCULAR | Status: DC | PRN
  Administered 2016-05-26: 100 ug via INTRAVENOUS
  Administered 2016-05-26: 50 ug via INTRAVENOUS

## 2016-05-26 MED ORDER — PHENYTOIN SODIUM EXTENDED 100 MG PO CAPS
300.0000 mg | ORAL_CAPSULE | Freq: Once | ORAL | Status: AC
  Administered 2016-05-26: 300 mg via ORAL
  Filled 2016-05-26: qty 3

## 2016-05-26 MED ORDER — KETOROLAC TROMETHAMINE 30 MG/ML IJ SOLN
INTRAMUSCULAR | Status: DC | PRN
  Administered 2016-05-26: 30 mg via INTRAVENOUS

## 2016-05-26 MED ORDER — HYDROCODONE-ACETAMINOPHEN 5-325 MG PO TABS: 1.0000 | ORAL_TABLET | ORAL | 0 refills | Status: DC | PRN

## 2016-05-26 MED ORDER — BUPIVACAINE HCL (PF) 0.5 % IJ SOLN
INTRAMUSCULAR | Status: AC
  Filled 2016-05-26: qty 30

## 2016-05-26 MED ORDER — BUPIVACAINE HCL (PF) 0.5 % IJ SOLN
INTRAMUSCULAR | Status: DC | PRN
  Administered 2016-05-26: 30 mL

## 2016-05-26 MED ORDER — FENTANYL CITRATE (PF) 100 MCG/2ML IJ SOLN
INTRAMUSCULAR | Status: AC
  Administered 2016-05-26: 25 ug via INTRAVENOUS
  Filled 2016-05-26: qty 2

## 2016-05-26 MED ORDER — ONDANSETRON HCL 4 MG/2ML IJ SOLN
4.0000 mg | Freq: Once | INTRAMUSCULAR | Status: DC | PRN
Start: 2016-05-26 — End: 2016-05-27

## 2016-05-26 MED ORDER — HYDROCODONE-ACETAMINOPHEN 5-325 MG PO TABS
1.0000 | ORAL_TABLET | ORAL | Status: DC | PRN
Start: 2016-05-26 — End: 2016-05-27
  Administered 2016-05-26: 1 via ORAL

## 2016-05-26 MED ORDER — SODIUM CHLORIDE 0.9 % IV SOLN
INTRAVENOUS | Status: DC
  Administered 2016-05-26 (×2): via INTRAVENOUS

## 2016-05-26 MED ORDER — GLYCOPYRROLATE 0.2 MG/ML IJ SOLN
INTRAMUSCULAR | Status: DC | PRN
  Administered 2016-05-26: 0.2 mg via INTRAVENOUS

## 2016-05-26 MED ORDER — LIDOCAINE HCL (CARDIAC) 20 MG/ML IV SOLN
INTRAVENOUS | Status: DC | PRN
  Administered 2016-05-26: 100 mg via INTRAVENOUS

## 2016-05-26 MED ORDER — DEXAMETHASONE SODIUM PHOSPHATE 10 MG/ML IJ SOLN
INTRAMUSCULAR | Status: DC | PRN
  Administered 2016-05-26: 10 mg via INTRAVENOUS

## 2016-05-26 MED ORDER — FAMOTIDINE 20 MG PO TABS
20.0000 mg | ORAL_TABLET | Freq: Once | ORAL | Status: AC
  Administered 2016-05-26: 20 mg via ORAL

## 2016-05-26 MED ORDER — PHENYLEPHRINE HCL 10 MG/ML IJ SOLN
INTRAMUSCULAR | Status: DC | PRN
  Administered 2016-05-26 (×6): 100 ug via INTRAVENOUS

## 2016-05-26 MED ORDER — BUPIVACAINE-EPINEPHRINE (PF) 0.25% -1:200000 IJ SOLN
INTRAMUSCULAR | Status: AC
  Filled 2016-05-26: qty 30

## 2016-05-26 MED ORDER — TECHNETIUM TC 99M SULFUR COLLOID
1.0000 | Freq: Once | INTRAVENOUS | Status: AC | PRN
  Administered 2016-05-26: 1.041 via INTRAVENOUS

## 2016-05-26 MED ORDER — PROPOFOL 10 MG/ML IV BOLUS
INTRAVENOUS | Status: DC | PRN
  Administered 2016-05-26: 120 mg via INTRAVENOUS

## 2016-05-26 SURGICAL SUPPLY — 56 items
ADHESIVE MASTISOL STRL (MISCELLANEOUS) IMPLANT
APPLIER CLIP 9.375 SM OPEN (CLIP)
BLADE SURG 15 STRL LF DISP TIS (BLADE) ×1 IMPLANT
BLADE SURG 15 STRL SS (BLADE) ×2
BRA SURGICAL 3XLRG (MISCELLANEOUS) ×3 IMPLANT
CANISTER SUCT 1200ML W/VALVE (MISCELLANEOUS) ×3 IMPLANT
CHLORAPREP W/TINT 26ML (MISCELLANEOUS) ×3 IMPLANT
CLIP APPLIE 9.375 SM OPEN (CLIP) IMPLANT
CLOSURE WOUND 1/2 X4 (GAUZE/BANDAGES/DRESSINGS)
CNTNR SPEC 2.5X3XGRAD LEK (MISCELLANEOUS) ×3
CONT SPEC 4OZ STER OR WHT (MISCELLANEOUS) ×6
CONTAINER SPEC 2.5X3XGRAD LEK (MISCELLANEOUS) ×3 IMPLANT
COVER PROBE FLX POLY STRL (MISCELLANEOUS) ×3 IMPLANT
DERMABOND ADVANCED (GAUZE/BANDAGES/DRESSINGS) ×2
DERMABOND ADVANCED .7 DNX12 (GAUZE/BANDAGES/DRESSINGS) ×1 IMPLANT
DRAPE LAPAROTOMY TRNSV 106X77 (MISCELLANEOUS) ×3 IMPLANT
ELECT CAUTERY BLADE 6.4 (BLADE) ×3 IMPLANT
ELECT CAUTERY BLADE TIP 2.5 (TIP) ×3
ELECT REM PT RETURN 9FT ADLT (ELECTROSURGICAL) ×3
ELECTRODE CAUTERY BLDE TIP 2.5 (TIP) ×1 IMPLANT
ELECTRODE REM PT RTRN 9FT ADLT (ELECTROSURGICAL) ×1 IMPLANT
GAUZE FLUFF 18X24 1PLY STRL (GAUZE/BANDAGES/DRESSINGS) ×3 IMPLANT
GEL ULTRASOUND 8.5OZ AQUASONIC (MISCELLANEOUS) ×3 IMPLANT
GLOVE BIO SURGEON STRL SZ 6.5 (GLOVE) ×4 IMPLANT
GLOVE BIO SURGEONS STRL SZ 6.5 (GLOVE) ×2
GLOVE BIOGEL PI IND STRL 7.0 (GLOVE) ×1 IMPLANT
GLOVE BIOGEL PI INDICATOR 7.0 (GLOVE) ×2
GLOVE INDICATOR 7.0 STRL GRN (GLOVE) ×6 IMPLANT
GLOVE PI ORTHOPRO 6.5 (GLOVE) ×2
GLOVE PI ORTHOPRO STRL 6.5 (GLOVE) ×1 IMPLANT
GOWN STRL REUS W/ TWL LRG LVL3 (GOWN DISPOSABLE) ×3 IMPLANT
GOWN STRL REUS W/TWL LRG LVL3 (GOWN DISPOSABLE) ×6
KIT RM TURNOVER STRD PROC AR (KITS) ×3 IMPLANT
LABEL OR SOLS (LABEL) ×3 IMPLANT
LIQUID BAND (GAUZE/BANDAGES/DRESSINGS) IMPLANT
NDL SAFETY 18GX1.5 (NEEDLE) ×3 IMPLANT
NDL SAFETY 22GX1.5 (NEEDLE) IMPLANT
NEEDLE HYPO 25X1 1.5 SAFETY (NEEDLE) ×3 IMPLANT
NEEDLE HYPO 27GX1-1/4 (NEEDLE) ×3 IMPLANT
PACK BASIN MINOR ARMC (MISCELLANEOUS) ×3 IMPLANT
SLEVE PROBE SENORX GAMMA FIND (MISCELLANEOUS) ×3 IMPLANT
SPONGE LAP 18X18 5 PK (GAUZE/BANDAGES/DRESSINGS) ×3 IMPLANT
STRIP CLOSURE SKIN 1/2X4 (GAUZE/BANDAGES/DRESSINGS) IMPLANT
SUT ETH BLK MONO 3 0 FS 1 12/B (SUTURE) ×3 IMPLANT
SUT MNCRL 4-0 (SUTURE) ×4
SUT MNCRL 4-0 27XMFL (SUTURE) ×2
SUT SILK 2 0 (SUTURE)
SUT SILK 2-0 18XBRD TIE 12 (SUTURE) IMPLANT
SUT VIC AB 3-0 SH 27 (SUTURE) ×10
SUT VIC AB 3-0 SH 27X BRD (SUTURE) ×5 IMPLANT
SUTURE MNCRL 4-0 27XMF (SUTURE) ×2 IMPLANT
SYR 3ML LL SCALE MARK (SYRINGE) ×3 IMPLANT
SYR BULB EAR ULCER 3OZ GRN STR (SYRINGE) ×3 IMPLANT
SYRINGE 10CC LL (SYRINGE) ×3 IMPLANT
TAPE MICROFOAM 4IN (TAPE) IMPLANT
WATER STERILE IRR 1000ML POUR (IV SOLUTION) ×3 IMPLANT

## 2016-05-26 NOTE — Anesthesia Procedure Notes (Signed)
Procedure Name: Intubation Date/Time: 05/26/2016 11:29 AM Performed by: Alda Berthold Pre-anesthesia Checklist: Patient identified, Patient being monitored, Timeout performed, Emergency Drugs available and Suction available Patient Re-evaluated:Patient Re-evaluated prior to inductionOxygen Delivery Method: Circle system utilized Preoxygenation: Pre-oxygenation with 100% oxygen Intubation Type: IV induction Ventilation: Mask ventilation without difficulty Laryngoscope Size: Mac and 3 Grade View: Grade II Tube type: Oral Tube size: 6.5 mm Number of attempts: 1 Placement Confirmation: ETT inserted through vocal cords under direct vision,  positive ETCO2 and breath sounds checked- equal and bilateral Secured at: 22 cm Tube secured with: Tape Dental Injury: Teeth and Oropharynx as per pre-operative assessment  Comments: Cricoid pressure to obtain grade 2 view

## 2016-05-26 NOTE — H&P (View-Only) (Signed)
Subjective:     Patient ID: Dawn Allen, female   DOB: [birth year], 65 y.o.   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 She has undergone 16 treatments of chemotherapy but was able to tolerate them. She has been doing well overall but she is very concerned about loosing her breast.  She does not have a strong family support system and even though she goes to church she has not felt comfortable to confide in anyone but one friend that she has cancer. She states that she is tired and has now lost her hair but otherwise doing well.   Past Medical History:  Diagnosis Date  . Anxiety   . Breast cancer ([place]) 08/2015   right breast, chemo  . Cancer of right female breast ([place])   . Coronary artery disease    a. NSTEMI cath 08/02/14: LM nl, pLAD 50%, mLCx 30%, mRCA 95% s/p PCI/DES, 2nd lesion 40%, EF 55%  . Diabetes mellitus without complication ([place])   . DVT (deep venous thrombosis) ([facility])    LEFT LEG   . HLD (hyperlipidemia)   . Hypertension   . Lung nodule   . MI (myocardial infarction) ([place])   . Seizure disorder ([place])   . Seizures ([place])   . Squamous cell lung cancer ([place]) 04/23/2015   Past Surgical History:  Procedure Laterality Date  . ABDOMINAL HYSTERECTOMY     PARTIAL   . BREAST BIOPSY Right 09/15/2015   positve  . CARDIAC CATHETERIZATION  08/02/2014  . CORONARY ANGIOPLASTY  08/02/2014   drug eluting stent placement  . IVC FILTER PLACEMENT ([facility] HX)    . LEG SURGERY Right    BLOOD CLOT  . PORTACATH PLACEMENT Left 10/13/2015   Procedure: INSERTION PORT-A-CATH;  Surgeon: Hubbard Robinson, MD;  Location: [facility] ORS;  Service: General;  Laterality: Left;   Family History  Problem Relation Age of Onset  . Heart attack Mother   . Breast cancer Neg Hx    Social History   Social History  . Marital status: Single     Spouse name: N/A  . Number of children: N/A  . Years of education: N/A   Social History Main Topics  . Smoking status: Current Every Day Smoker    Packs/day: 0.50    Years: 45.00    Types: Cigarettes  . Smokeless tobacco: Never Used  . Alcohol use No  . Drug use: No  . Sexual activity: Yes    Partners: Male   Other Topics Concern  . None   Social History Narrative  . None    Current Outpatient Prescriptions:  .  apixaban (ELIQUIS) 2.5 MG TABS tablet, Take 2.5 mg by mouth 2 (two) times daily. Reported on 01/01/2016, Disp: , Rfl:  .  Aspirin-Salicylamide-Caffeine (BC HEADACHE POWDER PO), Take 1 packet by mouth daily., Disp: , Rfl:  .  clindamycin (CLEOCIN) 300 MG capsule, Take 1 capsule (300 mg total) by mouth 3 (three) times daily., Disp: 30 capsule, Rfl: 0 .  diazepam (VALIUM) 5 MG tablet, Take 1 tablet by mouth 2 (two) times daily as needed. Reported on 12/16/2015, Disp: , Rfl:  .  empagliflozin (JARDIANCE) 25 MG TABS tablet, Take 25 mg by mouth daily., Disp: , Rfl:  .  HYDROcodone-acetaminophen (NORCO/VICODIN) 5-325 MG tablet, Take 1 tablet by mouth every 12 (twelve) hours as  needed. For pain (Patient not taking: Reported on 05/04/2016), Disp: 30 tablet, Rfl: 0 .  lidocaine-prilocaine (EMLA) cream, Apply 1 application topically as needed. Apply to port a cath site 1 hour before chemotherapy treatment., Disp: 30 g, Rfl: 6 .  lisinopril-hydrochlorothiazide (PRINZIDE,ZESTORETIC) 20-25 MG per tablet, Take 1 tablet by mouth daily. , Disp: , Rfl:  .  naproxen (NAPROSYN) 500 MG tablet, Take 1 tablet by mouth 2 (two) times daily. Reported on 11/03/2015, Disp: , Rfl:  .  nitroGLYCERIN (NITROSTAT) 0.4 MG SL tablet, Place 0.4 mg under the tongue every 5 (five) minutes as needed for chest pain. Reported on 12/16/2015, Disp: , Rfl:  .  ondansetron (ZOFRAN) 8 MG tablet, Take 1 tablet (8 mg total) by mouth every 8 (eight) hours as needed for nausea or vomiting (start 3 days; after chemo). (Patient not  taking: Reported on 05/04/2016), Disp: 40 tablet, Rfl: 0 .  phenytoin (DILANTIN) 100 MG ER capsule, Take 300 mg by mouth daily. , Disp: , Rfl:  .  pioglitazone (ACTOS) 45 MG tablet, Take 1 tablet by mouth daily., Disp: , Rfl:  .  prochlorperazine (COMPAZINE) 10 MG tablet, Take 1 tablet (10 mg total) by mouth every 6 (six) hours as needed for nausea or vomiting., Disp: 40 tablet, Rfl: 0 .  VENTOLIN HFA 108 (90 Base) MCG/ACT inhaler, Reported on 01/20/2016, Disp: , Rfl:  No current facility-administered medications for this visit.   Facility-Administered Medications Ordered in Other Visits:  .  albuterol (PROVENTIL) (2.5 MG/3ML) 0.083% nebulizer solution 2.5 mg, 2.5 mg, Nebulization, Once, Nestor Lewandowsky, MD .  sodium chloride flush (NS) 0.9 % injection 10 mL, 10 mL, Intravenous, PRN, Cammie Sickle, MD, 10 mL at 12/01/15 0850 Allergies  Allergen Reactions  . Penicillins     Patient denies allergy      Review of Systems  Constitutional: Positive for activity change, appetite change and fatigue. Negative for chills, fever and unexpected weight change.  HENT: Negative for congestion and sore throat.   Respiratory: Negative for cough, chest tightness and shortness of breath.   Cardiovascular: Negative for chest pain, palpitations and leg swelling.  Gastrointestinal: Negative for abdominal pain, diarrhea, nausea and vomiting.  Genitourinary: Negative for dysuria.  Musculoskeletal: Negative for back pain and neck pain.  Skin: Negative for color change, pallor, rash and wound.  Neurological: Negative for dizziness and weakness.  Hematological: Negative for adenopathy. Does not bruise/bleed easily.  Psychiatric/Behavioral: Negative for agitation. The patient is nervous/anxious.   All other systems reviewed and are negative.      Objective:   Physical Exam  Constitutional: She is oriented to person, place, and time. She appears well-developed and well-nourished. No distress.  HENT:   Head: Normocephalic and atraumatic.  Right Ear: External ear normal.  Left Ear: External ear normal.  Nose: Nose normal.  Mouth/Throat: Oropharynx is clear and moist. No oropharyngeal exudate.  Eyes: Conjunctivae and EOM are normal. Pupils are equal, round, and reactive to light. No scleral icterus.  Neck: Normal range of motion. Neck supple. No tracheal deviation present. Thyromegaly present.  Cardiovascular: Normal rate, regular rhythm, normal heart sounds and intact distal pulses.  Exam reveals no gallop and no friction rub.   No murmur heard. Pulmonary/Chest: Effort normal and breath sounds normal. No respiratory distress. She has no wheezes. She has no rales.  Right breast with 4 cm mass in the upper outer portion that is palpable but mobile and corresponds with mass on CT scan.  Abdominal: Soft. Bowel  sounds are normal. She exhibits no distension. There is no tenderness. There is no rebound and no guarding.  Musculoskeletal: Normal range of motion. She exhibits no edema, tenderness or deformity.  Neurological: She is alert and oriented to person, place, and time. No cranial nerve deficit.  Skin: Skin is warm and dry. No rash noted. No erythema. No pallor.  Psychiatric: She has a normal mood and affect. Her behavior is normal. Judgment and thought content normal.  Vitals reviewed.      CBC Latest Ref Rng & Units 05/04/2016 04/13/2016 03/16/2016  WBC 3.6 - 11.0 K/uL 7.7 7.4 9.0  Hemoglobin 12.0 - 16.0 g/dL 12.3 12.1 11.4(L)  Hematocrit 35.0 - 47.0 % 38.2 36.9 34.5(L)  Platelets 150 - 440 K/uL 176 160 169   CMP Latest Ref Rng & Units 04/13/2016 03/16/2016 02/25/2016  Glucose 65 - 99 mg/dL 233(H) 197(H) 231(H)  BUN 6 - 20 mg/dL 27(H) 22(H) 20  Creatinine 0.44 - 1.00 mg/dL 1.10(H) 0.74 0.90  Sodium 135 - 145 mmol/L 137 135 136  Potassium 3.5 - 5.1 mmol/L 3.6 3.2(L) 3.6  Chloride 101 - 111 mmol/L 101 98(L) 99(L)  CO2 22 - 32 mmol/L '29 28 28  '$ Calcium 8.9 - 10.3 mg/dL 8.6(L) 8.6(L) 8.8(L)   Total Protein 6.5 - 8.1 g/dL 7.1 7.1 -  Total Bilirubin 0.3 - 1.2 mg/dL 0.3 0.4 -  Alkaline Phos 38 - 126 U/L 200(H) 200(H) -  AST 15 - 41 U/L 17 14(L) -  ALT 14 - 54 U/L 17 15 -   U/S: Ultrasound targeted to the right breast at 11 o'clock, 8 cm from the nipple demonstrates again demonstrates the irregular hypoechoic mass which measures approximately 2.2 x 1.2 x 1.3 cm, previously 3.7 x 1.5 x 2.7 cm in radial and anti radial planes. A few small distance satellites can still be seen, with representative images taken of a 6 mm satellite approximately 2.5 cm closer to the nipple than the medial margin of the mass and a few small satellites approximately 3 cm more distant from the nipple from the margin of the mass.  Assessment:     65 yr old female with triple negative right breast cancer    Plan:     I discussed the available options with the patient which included mastectomy verse lumpectomy.  She is addament that she does not want a mastectomy even if it will not be cosmetically pleasing.  The risk of recurrence is similar between mastectomy and lumpectomy with radiation. The patient is willing to go through the radiation if she can keep her breast.  I also discussed that we would need to do a sentinel lymph node biopsy to check the nodes.    I discussed risks of bleeding, infection, damage to surrounding tissues, having positive margins, needing further resection, damage to nerves causing arm numbness or difficulty raising arm, causing lymphoedema in the arm; as well as anesthesia risks of MI, stroke, prolonged ventilation, pulmonary embolism, thrombosis and even death. Patient was given the opportunity to ask questions and have them answered. She would like to proceed with Right breast partial mastectomy with sentinel lymph node biopsy.  She was instructed that she would be off the Eliquis 3 days prior to surgery.

## 2016-05-26 NOTE — Interval H&P Note (Signed)
History and Physical Interval Note:  05/26/2016 11:08 AM  Dawn Allen  has presented today for surgery, with the diagnosis of right breast cancer  The various methods of treatment have been discussed with the patient and family. After consideration of risks, benefits and other options for treatment, the patient has consented to  Procedure(s): MASTECTOMY PARTIAL (Right) SENTINEL NODE BIOPSY (Right) AXILLARY LYMPH NODE BIOPSY (Right) as a surgical intervention .  The patient's history has been reviewed, patient examined, no change in status, stable for surgery.  I have reviewed the patient's chart and labs.  Questions were answered to the patient's satisfaction.     Gamaliel Charney L Mishika Flippen

## 2016-05-26 NOTE — Brief Op Note (Signed)
05/26/2016  3:00 PM  PATIENT:  Dawn Allen  65 y.o. female  PRE-OPERATIVE DIAGNOSIS:  right breast cancer  POST-OPERATIVE DIAGNOSIS:  right breast cancer  PROCEDURE:  Procedure(s): MASTECTOMY PARTIAL (Right) SENTINEL NODE BIOPSY (Right) AXILLARY LYMPH NODE BIOPSY (Right)  SURGEON:  Surgeon(s) and Role:    * Hubbard Robinson, MD - Primary  PHYSICIAN ASSISTANT:   ASSISTANTS: none   ANESTHESIA:   local and general  EBL:  Total I/O In: 700 [I.V.:700] Out: 25 [Blood:25]  BLOOD ADMINISTERED:none  DRAINS: none   LOCAL MEDICATIONS USED:  MARCAINE     SPECIMEN:  Lumpectomy with SLN  DISPOSITION OF SPECIMEN:  PATHOLOGY  COUNTS:  YES  TOURNIQUET:  * No tourniquets in log *  DICTATION: .Dragon Dictation  PLAN OF CARE: Discharge to home after PACU  PATIENT DISPOSITION:  PACU - hemodynamically stable.   Delay start of Pharmacological VTE agent (>24hrs) due to surgical blood loss or risk of bleeding: yes

## 2016-05-26 NOTE — Transfer of Care (Signed)
Immediate Anesthesia Transfer of Care Note  Patient: Dawn Allen  Procedure(s) Performed: Procedure(s): MASTECTOMY PARTIAL (Right) SENTINEL NODE BIOPSY (Right) AXILLARY LYMPH NODE BIOPSY (Right)  Patient Location: PACU  Anesthesia Type:General  Level of Consciousness: sedated and patient cooperative  Airway & Oxygen Therapy: Patient Spontanous Breathing and Patient connected to nasal cannula oxygen  Post-op Assessment: Report given to RN and Post -op Vital signs reviewed and stable  Post vital signs: Reviewed and stable  Last Vitals:  Vitals:   05/26/16 0852 05/26/16 1435  BP: (!) 144/80 105/67  Pulse: 81 80  Resp: 16 (!) 29  Temp: 37.2 C 36.7 C    Last Pain:  Vitals:   05/26/16 0852  TempSrc: Tympanic  PainSc: 0-No pain         Complications: No apparent anesthesia complications

## 2016-05-26 NOTE — Anesthesia Preprocedure Evaluation (Addendum)
Anesthesia Evaluation  Patient identified by MRN, date of birth, ID band Patient awake    Reviewed: Allergy & Precautions, NPO status , Patient's Chart, lab work & pertinent test results  Airway Mallampati: II       Dental  (+) Poor Dentition, Missing, Dental Advisory Given   Pulmonary COPD,  COPD inhaler, Current Smoker,     + decreased breath sounds      Cardiovascular hypertension, Pt. on medications + CAD, + Past MI and + DVT   Rhythm:Regular Rate:Normal     Neuro/Psych Seizures -, Well Controlled,  Anxiety    GI/Hepatic negative GI ROS, Neg liver ROS,   Endo/Other  diabetes, Well Controlled, Type 2, Oral Hypoglycemic Agents  Renal/GU negative Renal ROS  negative genitourinary   Musculoskeletal   Abdominal (+) + obese,   Peds negative pediatric ROS (+)  Hematology   Anesthesia Other Findings EF 55%  Reproductive/Obstetrics                             Anesthesia Physical  Anesthesia Plan  ASA: III  Anesthesia Plan: General   Post-op Pain Management:    Induction: Intravenous  Airway Management Planned: Oral ETT  Additional Equipment:   Intra-op Plan:   Post-operative Plan: Extubation in OR  Informed Consent: I have reviewed the patients History and Physical, chart, labs and discussed the procedure including the risks, benefits and alternatives for the proposed anesthesia with the patient or authorized representative who has indicated his/her understanding and acceptance.     Plan Discussed with: CRNA  Anesthesia Plan Comments:         Anesthesia Quick Evaluation

## 2016-05-26 NOTE — OR Nursing (Signed)
Dr Azalee Course notified that patient refused scd also notified that patient took aspirin yesterday.  No new orders. Patient given option to access port or have IV patient wanted IV.

## 2016-05-26 NOTE — Discharge Instructions (Signed)

## 2016-05-26 NOTE — Progress Notes (Signed)
Pain level 1 on discharge

## 2016-05-27 ENCOUNTER — Encounter: Payer: Self-pay | Admitting: *Deleted

## 2016-05-27 ENCOUNTER — Encounter: Payer: Self-pay | Admitting: Surgery

## 2016-05-27 LAB — GLUCOSE, CAPILLARY: Glucose-Capillary: 180 mg/dL — ABNORMAL HIGH (ref 65–99)

## 2016-05-27 NOTE — Anesthesia Postprocedure Evaluation (Signed)
Anesthesia Post Note  Patient: Dawn Allen  Procedure(s) Performed: Procedure(s) (LRB): MASTECTOMY PARTIAL (Right) SENTINEL NODE BIOPSY (Right) AXILLARY LYMPH NODE BIOPSY (Right)  Patient location during evaluation: PACU Anesthesia Type: General Level of consciousness: awake and alert and oriented Pain management: pain level controlled Vital Signs Assessment: post-procedure vital signs reviewed and stable Respiratory status: spontaneous breathing Cardiovascular status: blood pressure returned to baseline Anesthetic complications: no    Last Vitals:  Vitals:   05/26/16 1546 05/26/16 1600  BP: 123/64 124/67  Pulse: 77 86  Resp: 16   Temp: 36.3 C     Last Pain:  Vitals:   05/26/16 1600  TempSrc:   PainSc: 1                  Dyland Panuco

## 2016-05-27 NOTE — Op Note (Signed)
Breast Lumpectomy with Sentinal Node Biopsy Procedure Note  Indications: This patient presents with history of right breast cancer with clinically negative axillary lymph node exam.  Pre-operative Diagnosis: right breast cancer  Post-operative Diagnosis: right breast cancer  Surgeon: Hubbard Robinson   Assistants: none  Anesthesia: General endotracheal anesthesia  ASA Class: 2  Procedure Details  The patient was seen in the Holding Room. The risks, benefits, complications, treatment options, and expected outcomes were discussed with the patient. The possibilities of reaction to medication, pulmonary aspiration, bleeding, infection, the need for additional procedures, failure to diagnose a condition, and creating a complication requiring transfusion or operation were discussed with the patient. The patient concurred with the proposed plan, giving informed consent.  The site of surgery properly noted/marked. The patient was taken to the Operating Room, identified as Dawn Allen and the procedure verified as Breast Lumpectomy and Sentinal Node Biopsy. A Time Out was held and the above information confirmed.  After induction of anesthesia, the right arm, breast, and chest were prepped and draped in standard fashion.  Using a hand-held gamma probe, axillary sentinel nodes were identified transcutaneously.  An oblique incision was created below the axillary hairline.  Dissection was carried through the clavipectoral fascia.  Two axillary sentinel nodes were removed and submitted to pathology. The wound was irrigated The axillary incision was closed with a 3-0 Vicryl and 4-0 Monocryl subcuticular closure in layers.    The lumpectomy was performed by creating an curvilinear incision around the superior nipple and extending out laterally.  Dissection was carried down to the pectoral fascia.  Orientation sutures were placed and specimen was sent to pathology to evaluate margins.  Pathologist called  with clear margins.  Hemostasis was achieved with cautery.  The wound was irrigated and closed with a 3-0 vicryl  in layers to reappoximate the breast tissue lifting and making the area smaller.  Excess skin was then excised and 4-0 Monocryl used in subcuticular closure to skin.      Sterile glue was then applied. At the end of the operation, all sponge, instrument, and needle counts were correct.  Findings: grossly clear surgical margins  Estimated Blood Loss:  less than 50 mL         Drains: none         Total IV Fluids: 1041m         Specimens: right breast mass, sentinel lymph nodes #1 and #2         Implants: none         Complications:  None; patient tolerated the procedure well.         Disposition: PACU - hemodynamically stable.         Condition: stable

## 2016-05-28 NOTE — Progress Notes (Signed)
  Oncology Nurse Navigator Documentation  Navigator Location: [facility] (05/28/16 0800) Navigator Encounter Type: Telephone (05/28/16 0800) Telephone: Incoming Call (05/28/16 0800)             Barriers/Navigation Needs: Education (05/28/16 0800) Education:  (post surgery questions) (05/28/16 0800)       Education Method: Verbal (05/28/16 0800)                Time Spent with Patient: 30 (05/28/16 0800)   Patient called yesterday afternoon with questions regarding her post operative bra.  States it is too tight.  Encouraged her to loosen it a little, but to follow instructions from Dr. Azalee Course.  Encouraged her to call Dr. Geoffry Paradise office this morning if she remained very uncomfortable.  Reviewed reason for tight support bra post surgery and importance of wearing it.  She is to call if she has any questions or needs.

## 2016-06-01 LAB — SURGICAL PATHOLOGY

## 2016-06-02 ENCOUNTER — Telehealth: Payer: Self-pay | Admitting: Surgery

## 2016-06-02 NOTE — Telephone Encounter (Signed)
Patient has called. She recently had Right Breast Lumpectomy with Sentinal Node Biopsy  On 05/26/16 with Dr Azalee Course. She states that under the Right Arm, there is swelling that has a burning sensation. She states that there is not draining from the incision site and no fever. She would like some advise on how to manage this. She states that she has no transportation to come in for an office visit if required.

## 2016-06-03 NOTE — Telephone Encounter (Signed)
Returned phone call to patient at this time. No answer. Left voicemail for return phone call. 

## 2016-06-04 NOTE — Telephone Encounter (Signed)
Phone call made to patient once again at this time. No answer. Left voicemail for return phone call.

## 2016-06-07 ENCOUNTER — Telehealth: Payer: Self-pay | Admitting: Surgery

## 2016-06-07 NOTE — Telephone Encounter (Signed)
Patient has called back and had questions concerning how long it will take for the sutures to dissolve. I have advised patient this could take up to 4 weeks.   Patient also calls with concerns that she has been constipated for at least 2 weeks. She has tried Doculax with no success.

## 2016-06-07 NOTE — Telephone Encounter (Signed)
Called patient back and left her a detailed message to let her know that she could take Miralax 17G twice a day to help her have a bowel movement. I also stated that she should call us back if this does not help her.

## 2016-06-08 ENCOUNTER — Inpatient Hospital Stay: Payer: Medicare Other | Attending: Internal Medicine | Admitting: Internal Medicine

## 2016-06-08 VITALS — BP 155/90 | HR 79 | Temp 98.6°F | Resp 20 | Ht 65.0 in | Wt 180.8 lb

## 2016-06-08 DIAGNOSIS — I252 Old myocardial infarction: Secondary | ICD-10-CM | POA: Insufficient documentation

## 2016-06-08 DIAGNOSIS — Z85850 Personal history of malignant neoplasm of thyroid: Secondary | ICD-10-CM | POA: Diagnosis not present

## 2016-06-08 DIAGNOSIS — G893 Neoplasm related pain (acute) (chronic): Secondary | ICD-10-CM | POA: Diagnosis not present

## 2016-06-08 DIAGNOSIS — Z7901 Long term (current) use of anticoagulants: Secondary | ICD-10-CM | POA: Insufficient documentation

## 2016-06-08 DIAGNOSIS — G40909 Epilepsy, unspecified, not intractable, without status epilepticus: Secondary | ICD-10-CM | POA: Diagnosis not present

## 2016-06-08 DIAGNOSIS — Z86718 Personal history of other venous thrombosis and embolism: Secondary | ICD-10-CM | POA: Insufficient documentation

## 2016-06-08 DIAGNOSIS — K5903 Drug induced constipation: Secondary | ICD-10-CM

## 2016-06-08 DIAGNOSIS — G8928 Other chronic postprocedural pain: Secondary | ICD-10-CM

## 2016-06-08 DIAGNOSIS — Z85118 Personal history of other malignant neoplasm of bronchus and lung: Secondary | ICD-10-CM

## 2016-06-08 DIAGNOSIS — E785 Hyperlipidemia, unspecified: Secondary | ICD-10-CM | POA: Diagnosis not present

## 2016-06-08 DIAGNOSIS — Z171 Estrogen receptor negative status [ER-]: Secondary | ICD-10-CM | POA: Insufficient documentation

## 2016-06-08 DIAGNOSIS — I2510 Atherosclerotic heart disease of native coronary artery without angina pectoris: Secondary | ICD-10-CM | POA: Diagnosis not present

## 2016-06-08 DIAGNOSIS — E119 Type 2 diabetes mellitus without complications: Secondary | ICD-10-CM | POA: Diagnosis not present

## 2016-06-08 DIAGNOSIS — Z79899 Other long term (current) drug therapy: Secondary | ICD-10-CM | POA: Insufficient documentation

## 2016-06-08 DIAGNOSIS — T402X5A Adverse effect of other opioids, initial encounter: Secondary | ICD-10-CM

## 2016-06-08 DIAGNOSIS — F17210 Nicotine dependence, cigarettes, uncomplicated: Secondary | ICD-10-CM | POA: Diagnosis not present

## 2016-06-08 DIAGNOSIS — C50811 Malignant neoplasm of overlapping sites of right female breast: Secondary | ICD-10-CM | POA: Diagnosis present

## 2016-06-08 DIAGNOSIS — I10 Essential (primary) hypertension: Secondary | ICD-10-CM | POA: Insufficient documentation

## 2016-06-08 DIAGNOSIS — K5900 Constipation, unspecified: Secondary | ICD-10-CM | POA: Diagnosis not present

## 2016-06-08 MED ORDER — POLYETHYLENE GLYCOL 3350 17 GM/SCOOP PO POWD: 1.0000 | Freq: Once | ORAL | 0 refills | Status: DC

## 2016-06-08 MED ORDER — HYDROCODONE-ACETAMINOPHEN 5-325 MG PO TABS: 1.0000 | ORAL_TABLET | Freq: Four times a day (QID) | ORAL | 0 refills | Status: DC | PRN

## 2016-06-08 MED ORDER — POLYETHYLENE GLYCOL 3350 17 GM/SCOOP PO POWD: 1.0000 | Freq: Once | ORAL | Status: DC

## 2016-06-08 NOTE — Progress Notes (Signed)
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   # Smoker; ? Paranoia; Feb 2017- MUGA scan- 67%     Carcinoma of overlapping sites of right breast in female, estrogen receptor negative ([facility]  02/17/2016 Initial Diagnosis    Cancer of overlapping sites of right female breast ([facility]      INTERVAL HISTORY: Poor- vague historian  [age]ear old female patient with above history of newly diagnosed breast cancer- triple negative; clinical stage II- currently on neo-adjuvant Chemotherapy followed by lumpectomy and sentinel lymph node biopsy is here for follow-up  Patient continues to complain of significant pain at the site of surgery. She continues to be on hydrocodone for pain. Complains of constipation.   Otherwise patient denies any unusual shortness of breath or chest pain or cough.   REVIEW OF SYSTEMS: Difficult to assess given as patient is a poor historian.  PAST MEDICAL HISTORY :  Past Medical History:  Diagnosis Date  . Anxiety   . Breast cancer ([facility]1/2017   right breast, chemo  . Cancer of right female breast ([facility]  . Coronary artery disease    a. NSTEMI cath 08/02/14: LM nl, pLAD 50%, mLCx 30%, mRCA 95% s/p PCI/DES, 2nd lesion 40%, EF 55%  . Diabetes mellitus without complication ([facility]  . DVT (deep venous thrombosis) (HCC)    LEFT LEG   . HLD (hyperlipidemia)   . Hypertension   . Lung nodule    . MI (myocardial infarction)   . Seizure disorder ([facility]  . Seizures ([facility]  . Squamous cell lung cancer ([facility]8/31/2016    PAST SURGICAL HISTORY :   Past Surgical History:  Procedure Laterality Date  . ABDOMINAL HYSTERECTOMY     PARTIAL   . AXILLARY LYMPH NODE BIOPSY Right 05/26/2016   Procedure: AXILLARY LYMPH NODE BIOPSY;  Surgeon: CaHubbard RobinsonMD;  Location: [facility] ORS;  Service: General;  Laterality: Right;  . BREAST BIOPSY Right 09/15/2015   positve  . CARDIAC CATHETERIZATION  08/02/2014  . CORONARY ANGIOPLASTY  08/02/2014   drug eluting stent placement  . IVC FILTER PLACEMENT ([facility] HX)    . LEG SURGERY Right    BLOOD CLOT  . MASTECTOMY, PARTIAL Right 05/26/2016   Procedure: MASTECTOMY PARTIAL;  Surgeon: CaHubbard RobinsonMD;  Location: [facility] ORS;  Service: General;  Laterality: Right;  . PORTACATH PLACEMENT Left 10/13/2015   Procedure: INSERTION PORT-A-CATH;  Surgeon: CaHubbard RobinsonMD;  Location: [facility] ORS;  Service: General;  Laterality: Left;  . SENTINEL NODE BIOPSY Right 05/26/2016   Procedure: SENTINEL NODE BIOPSY;  Surgeon: CaHubbard RobinsonMD;  Location: [facility] ORS;  Service: General;  Laterality: Right;    FAMILY HISTORY :   Family History  Problem Relation Age of Onset  . Heart attack Mother   . Breast cancer Neg Hx  SOCIAL HISTORY:   Social History  Substance Use Topics  . Smoking status: Current Every Day Smoker    Packs/day: 1.00    Years: 45.00    Types: Cigarettes  . Smokeless tobacco: Never Used  . Alcohol use No    ALLERGIES:  is allergic to penicillins.  MEDICATIONS:  Current Outpatient Prescriptions  Medication Sig Dispense Refill  . apixaban (ELIQUIS) 2.5 MG TABS tablet Take 1 tablet (2.5 mg total) by mouth 2 (two) times daily. Reported on 01/01/2016 60 tablet 0  . Aspirin-Salicylamide-Caffeine (BC HEADACHE POWDER PO) Take 1 packet by mouth daily.    . clindamycin (CLEOCIN) 300 MG capsule Take 1 capsule (300 mg total) by mouth  3 (three) times daily. 30 capsule 0  . diazepam (VALIUM) 5 MG tablet Take 1 tablet by mouth 2 (two) times daily as needed. Reported on 12/16/2015    . empagliflozin (JARDIANCE) 25 MG TABS tablet Take 25 mg by mouth daily.    Marland Kitchen HYDROcodone-acetaminophen (NORCO/VICODIN) 5-325 MG tablet Take 1-2 tablets by mouth every 6 (six) hours as needed for moderate pain or severe pain. For pain 60 tablet 0  . lidocaine-prilocaine (EMLA) cream Apply 1 application topically as needed. Apply to port a cath site 1 hour before chemotherapy treatment. 30 g 6  . lisinopril-hydrochlorothiazide (PRINZIDE,ZESTORETIC) 20-25 MG per tablet Take 1 tablet by mouth daily.     . naproxen (NAPROSYN) 500 MG tablet Take 1 tablet by mouth 2 (two) times daily. Reported on 11/03/2015    . nitroGLYCERIN (NITROSTAT) 0.4 MG SL tablet Place 0.4 mg under the tongue every 5 (five) minutes as needed for chest pain. Reported on 12/16/2015    . ondansetron (ZOFRAN) 8 MG tablet Take 1 tablet (8 mg total) by mouth every 8 (eight) hours as needed for nausea or vomiting (start 3 days; after chemo). 40 tablet 0  . phenytoin (DILANTIN) 100 MG ER capsule Take 300 mg by mouth daily.     . pioglitazone (ACTOS) 45 MG tablet Take 1 tablet by mouth daily.    . polyethylene glycol powder (MIRALAX) powder Take 255 g by mouth once. Take as directed 255 g 0  . prochlorperazine (COMPAZINE) 10 MG tablet Take 1 tablet (10 mg total) by mouth every 6 (six) hours as needed for nausea or vomiting. 40 tablet 0  . VENTOLIN HFA 108 (90 Base) MCG/ACT inhaler Inhale 1 puff into the lungs every 6 (six) hours as needed. Reported on 01/20/2016     No current facility-administered medications for this visit.    Facility-Administered Medications Ordered in Other Visits  Medication Dose Route Frequency Provider Last Rate Last Dose  . albuterol (PROVENTIL) (2.5 MG/3ML) 0.083% nebulizer solution 2.5 mg  2.5 mg Nebulization Once Nestor Lewandowsky, MD      . sodium chloride flush (NS)  0.9 % injection 10 mL  10 mL Intravenous PRN Cammie Sickle, MD   10 mL at 12/01/15 0850    PHYSICAL EXAMINATION: ECOG PERFORMANCE STATUS: 0 - Asymptomatic  BP (!) 155/90 (BP Location: Left Arm, Patient Position: Sitting)   Pulse 79   Temp 98.6 F (37 C) (Oral)   Resp 20   Ht _0  (1.651 m)   Wt 180 lb 12.4 oz (82 kg)   BMI 30.08 kg/m   Filed Weights   06/08/16 0937  Weight: 180 lb 12.4 oz (82 kg)    GENERAL: Well-nourished well-developed; Alert, no distress and comfortable. Alone;  EYES: no pallor or icterus OROPHARYNX: no thrush  or ulceration; right lower jaw molar- tenderness; no pus noted.  NECK: supple, no masses felt LYMPH: no palpable lymphadenopathy in the cervical, axillary or inguinal regions LUNGS: clear to auscultation and No wheeze or crackles HEART/CVS: regular rate & rhythm and no murmurs; No lower extremity edema ABDOMEN:abdomen soft, non-tender and normal bowel sounds Musculoskeletal:no cyanosis of digits and no clubbing  PSYCH: alert & oriented x 3 with fluent speech NEURO: no focal motor/sensory deficits Reluctant for breast exam.    LABORATORY DATA:  I have reviewed the data as listed    Component Value Date/Time   NA 136 05/04/2016 1009   NA 141 08/03/2014 0050   K 3.4 (L) 05/04/2016 1009   K 3.3 (L) 08/03/2014 0050   CL 97 (L) 05/04/2016 1009   CL 108 (H) 08/03/2014 0050   CO2 29 05/04/2016 1009   CO2 27 08/03/2014 0050   GLUCOSE 183 (H) 05/04/2016 1009   GLUCOSE 164 (H) 08/03/2014 0050   BUN 23 (H) 05/04/2016 1009   BUN 14 08/03/2014 0050   CREATININE 0.78 05/04/2016 1009   CREATININE 0.68 08/03/2014 0050   CALCIUM 9.0 05/04/2016 1009   CALCIUM 8.2 (L) 08/03/2014 0050   PROT 7.5 05/04/2016 1009   PROT 6.7 04/11/2013 2241   ALBUMIN 3.5 05/04/2016 1009   ALBUMIN 2.7 (L) 04/11/2013 2241   AST 14 (L) 05/04/2016 1009   AST 13 (L) 04/11/2013 2241   ALT 13 (L) 05/04/2016 1009   ALT 16 04/11/2013 2241   ALKPHOS 224 (H)  05/04/2016 1009   ALKPHOS 207 (H) 04/11/2013 2241   BILITOT <0.1 (L) 05/04/2016 1009   BILITOT 0.1 (L) 04/11/2013 2241   GFRNONAA >60 05/04/2016 1009   GFRNONAA >60 08/03/2014 0050   GFRNONAA >60 04/11/2013 2241   GFRAA >60 05/04/2016 1009   GFRAA >60 08/03/2014 0050   GFRAA >60 04/11/2013 2241    No results found for: SPEP, UPEP  Lab Results  Component Value Date   WBC 7.7 05/04/2016   NEUTROABS 5.3 05/04/2016   HGB 12.3 05/04/2016   HCT 38.2 05/04/2016   MCV 92.5 05/04/2016   PLT 176 05/04/2016      Chemistry      Component Value Date/Time   NA 136 05/04/2016 1009   NA 141 08/03/2014 0050   K 3.4 (L) 05/04/2016 1009   K 3.3 (L) 08/03/2014 0050   CL 97 (L) 05/04/2016 1009   CL 108 (H) 08/03/2014 0050   CO2 29 05/04/2016 1009   CO2 27 08/03/2014 0050   BUN 23 (H) 05/04/2016 1009   BUN 14 08/03/2014 0050   CREATININE 0.78 05/04/2016 1009   CREATININE 0.68 08/03/2014 0050      Component Value Date/Time   CALCIUM 9.0 05/04/2016 1009   CALCIUM 8.2 (L) 08/03/2014 0050   ALKPHOS 224 (H) 05/04/2016 1009   ALKPHOS 207 (H) 04/11/2013 2241   AST 14 (L) 05/04/2016 1009   AST 13 (L) 04/11/2013 2241   ALT 13 (L) 05/04/2016 1009   ALT 16 04/11/2013 2241   BILITOT <0.1 (L) 05/04/2016 1009   BILITOT 0.1 (L) 04/11/2013 2241        ASSESSMENT & PLAN:   Carcinoma of overlapping sites of right breast in female, estrogen receptor negative ([place]) # Breast Ca- ER/PR- Neg; Her2-NEG. T3N0- STAGE II; triple negative- on neoadjuvant chemo;s/p neo-adjuvant chemotherapy; s/p Lumpectomy &  pTNM: ypT3 ypN0 (sn). Will refer to Dr.Crystal re: radiation. Discussed with Dr.Rubinas from pathology.   # Chronic pain/and pain  from breast surgery- continue hydrocodone1-2every 6-8 hours as needed/2 weeks script given  # constipation- recommend miralax   # follow up with 2 months/labs.      Cammie Sickle, MD 06/08/2016 12:52 PM

## 2016-06-08 NOTE — Progress Notes (Signed)
Constipation- no BM  Since week of her surgery. taken 1 dulcolax every other day.  I encouraged her take dulcolax daily with a full glass of water.  She does not have any miralax at home and states that she can not afford it. We also discussed the use of milk of magnesium.  Patient reports tenderness and swelling - at right breast. Pt wearing bra during the day. Does not wear support bra at bedtime. I encouraged the consistent use of support bra to reduce pain and swelling.

## 2016-06-08 NOTE — Assessment & Plan Note (Addendum)
#  Breast Ca- ER/PR- Neg; Her2-NEG. T3N0- STAGE II; triple negative- on neoadjuvant chemo;s/p neo-adjuvant chemotherapy; s/p Lumpectomy &  pTNM: ypT3 ypN0 (sn). Will refer to Dr.Crystal re: radiation. Discussed with Dr.Rubinas from pathology.   # Chronic pain/and pain from breast surgery- continue hydrocodone1-2every 6-8 hours as needed/2 weeks script given  # constipation- recommend miralax   # follow up with 2 months/labs.

## 2016-06-09 ENCOUNTER — Telehealth: Payer: Self-pay | Admitting: *Deleted

## 2016-06-09 NOTE — Telephone Encounter (Signed)
Patient contacted RN at cancer center. C/o tooth pain and abscess. "Im not able to go to the dentist right now. I just can not get there. I don't have a way. My I have permission use the clindamycin oral capsules to help with my tooth pain. Has left over tablets not yet taken." Spoke with [name] regarding her request. V/o [name] proceed with clindamycin. X 7 days. Teach back process performed with patient.

## 2016-06-14 ENCOUNTER — Other Ambulatory Visit: Payer: Self-pay

## 2016-06-14 ENCOUNTER — Ambulatory Visit (INDEPENDENT_AMBULATORY_CARE_PROVIDER_SITE_OTHER): Payer: Medicare Other | Admitting: Surgery

## 2016-06-14 ENCOUNTER — Encounter: Payer: Self-pay | Admitting: Surgery

## 2016-06-14 ENCOUNTER — Inpatient Hospital Stay: Payer: Medicare Other | Attending: Family Medicine

## 2016-06-14 VITALS — BP 136/83 | HR 90 | Temp 98.4°F | Ht 65.0 in | Wt 179.8 lb

## 2016-06-14 DIAGNOSIS — C50811 Malignant neoplasm of overlapping sites of right female breast: Secondary | ICD-10-CM

## 2016-06-14 DIAGNOSIS — Z171 Estrogen receptor negative status [ER-]: Secondary | ICD-10-CM

## 2016-06-14 MED ORDER — CLINDAMYCIN HCL 300 MG PO CAPS: 300.0000 mg | ORAL_CAPSULE | Freq: Three times a day (TID) | ORAL | 0 refills | Status: DC

## 2016-06-14 NOTE — Progress Notes (Signed)
Outpatient Surgical Follow Up  06/14/2016  Dawn Allen is an 65 y.o. female seen for the diagnosis of Carcinoma of overlapping sites of right breast in female, estrogen receptor negative ([place]) [C50.811, Z17.1].  HPI: Patient seen and examined in clinic. She is  S/p Right breast quadrantectomy and axillary SLN biopsies on 10/4.   Overall she is doing well with only complaint some soreness under Right axilla with movement.  She denies any fever or chills but does state that her tooth abscess is bothering her again and she doesn't have the money to get it fixed.    Past Medical History:  Diagnosis Date  . Anxiety   . Breast cancer ([place]) 08/2015   right breast, chemo  . Cancer of right female breast ([place])   . Coronary artery disease    a. NSTEMI cath 08/02/14: LM nl, pLAD 50%, mLCx 30%, mRCA 95% s/p PCI/DES, 2nd lesion 40%, EF 55%  . Diabetes mellitus without complication ([place])   . DVT (deep venous thrombosis) (H[facility])    LEFT LEG   . HLD (hyperlipidemia)   . Hypertension   . Lung nodule   . MI (myocardial infarction)   . Seizure disorder ([place])   . Seizures ([place])   . Squamous cell lung cancer ([place]) 04/23/2015    Past Surgical History:  Procedure Laterality Date  . ABDOMINAL HYSTERECTOMY     PARTIAL   . AXILLARY LYMPH NODE BIOPSY Right 05/26/2016   Procedure: AXILLARY LYMPH NODE BIOPSY;  Surgeon: Hubbard Robinson, MD;  Location: [facility] ORS;  Service: General;  Laterality: Right;  . BREAST BIOPSY Right 09/15/2015   positve  . CARDIAC CATHETERIZATION  08/02/2014  . CORONARY ANGIOPLASTY  08/02/2014   drug eluting stent placement  . IVC FILTER PLACEMENT ([facility] HX)    . LEG SURGERY Right    BLOOD CLOT  . MASTECTOMY, PARTIAL Right 05/26/2016   Procedure: MASTECTOMY PARTIAL;  Surgeon: Hubbard Robinson, MD;  Location: [facility] ORS;  Service: General;  Laterality: Right;  . PORTACATH PLACEMENT Left 10/13/2015   Procedure: INSERTION PORT-A-CATH;  Surgeon: Hubbard Robinson, MD;  Location: [facility]  ORS;  Service: General;  Laterality: Left;  . SENTINEL NODE BIOPSY Right 05/26/2016   Procedure: SENTINEL NODE BIOPSY;  Surgeon: Hubbard Robinson, MD;  Location: [facility] ORS;  Service: General;  Laterality: Right;    Family History  Problem Relation Age of Onset  . Heart attack Mother   . Breast cancer Neg Hx     Social History:  reports that she has been smoking Cigarettes.  She has a 45.00 pack-year smoking history. She has never used smokeless tobacco. She reports that she does not drink alcohol or use drugs.  Allergies:  Allergies  Allergen Reactions  . Penicillins Swelling    rash    Medications reviewed.  Physical Exam:  BP 136/83   Pulse 90   Temp 98.4 F (36.9 C) (Oral)   Ht '5\' 5"'$  (1.651 m)   Wt 179 lb 12.8 oz (81.6 kg)   BMI 29.92 kg/m   Gen: patient resting comfortably in clinic, no cardiovascular or respiratory distress Right Breast: incision on breast clean, dry, intact with glue in place, no erythema, small seroma present, right axillary incision c/d/i no erythema, small seroma present, no erythema or drainage  . Assessment/Plan: Dawn Allen is an 65 y.o. female seen for the diagnosis of Carcinoma of overlapping sites of right breast in female, estrogen receptor negative ([place]) [C50.811, Z17.1]. Progressing as expected.  Discussed with her continued use of compression from gauze because bra is sub optimal. She will see Dr. Donella Stade on Wed to start Radiation treatment.  Will have her f/u in 3 weeks to ensure seromas completely resolved.     Catherine L. Loflin MD General Surgeon  06/14/2016,11:37 AM

## 2016-06-14 NOTE — Patient Instructions (Addendum)
We will see you back in 3 weeks. Please see your appointment below. I will contact Sheena and get her to set-up the [facility] for you.  We have sent your antibiotics to your pharmacy at this time. Please start these today and take 3 times daily until they are gone.  Please call if you have any questions or concerns.

## 2016-06-15 ENCOUNTER — Telehealth: Payer: Self-pay | Admitting: *Deleted

## 2016-06-15 ENCOUNTER — Telehealth: Payer: Self-pay

## 2016-06-15 NOTE — Telephone Encounter (Signed)
Called patient's friend, Kern Reap who states that she does not have transportation right now to take Ms. Eleesha to the dentist.   Call made once again to her God-daughter, Ivin Booty with no answer.  Spoke with patient once again explaining that I have exhausted my resources to this point but can offer her a cab through the [facility] and the patient will not agree to be taken by Cab. She asked that her appointment time be changed to November the 1st when someone can take her to her appointment. I made many attempts to convince her to go to the dentist this afternoon by cab but she is not in agreement.  Call was made to [facility] to move patient's appointment to November 1st. No answer. Left voicemail stating that I would like to reschedule this to November 1st and asked for a return phone call.

## 2016-06-15 NOTE — Telephone Encounter (Signed)
It appears that another office has handled this

## 2016-06-15 NOTE — Telephone Encounter (Signed)
error 

## 2016-06-15 NOTE — Telephone Encounter (Signed)
Patient calls with report of severe tooth pain that is unrelieved by use of BC powder or aleve. Reports that hydrocodone does not help either. Is requesting assistance in where she could receive care for tooth for emergency cases such has hers. Will forward to triage for direction from medical oncology.

## 2016-06-15 NOTE — Telephone Encounter (Signed)
Call once again made to [facility]. No answer. Left voicemail for return phone call.   Call made to [facility] to find out if we can arrange transportation for patient for appointment.

## 2016-06-15 NOTE — Telephone Encounter (Signed)
Patient called crying this am stating that she is in so much pain from her tooth. She has taken 3 BC, 2 Ibuprofen, several Tylenol without relief. She states that she "just don't know what to do." I made a call to Vita Barley at the [place] at this time to get an idea of resources in the county. She states that [facility] has a dental clinic.  Call was made to [facility]. Spoke with Tresea Mall. She has worked patient in today at [employer] and patient will just need to bring a $3.00 copay.   Return phone call made to patient. She does not know who can bring her. She states that Kern Reap does not have a car now but asked if I could call her God-daughter Ivin Booty. Call was made to [facility], no answer. Left voicemail for return phone call.

## 2016-06-16 ENCOUNTER — Ambulatory Visit
Admission: RE | Admit: 2016-06-16 | Discharge: 2016-06-16 | Disposition: A | Discharge status: Home / Self Care | Payer: Medicare Other | Source: Ambulatory Visit | Attending: Radiation Oncology | Admitting: Radiation Oncology

## 2016-06-16 ENCOUNTER — Encounter: Payer: Self-pay | Admitting: Radiation Oncology

## 2016-06-16 VITALS — BP 145/81 | HR 91 | Temp 98.6°F | Resp 20 | Wt 180.6 lb

## 2016-06-16 DIAGNOSIS — C50411 Malignant neoplasm of upper-outer quadrant of right female breast: Secondary | ICD-10-CM

## 2016-06-16 DIAGNOSIS — Z510 Encounter for antineoplastic radiation therapy: Secondary | ICD-10-CM | POA: Insufficient documentation

## 2016-06-16 DIAGNOSIS — Z171 Estrogen receptor negative status [ER-]: Secondary | ICD-10-CM | POA: Insufficient documentation

## 2016-06-16 DIAGNOSIS — C3411 Malignant neoplasm of upper lobe, right bronchus or lung: Secondary | ICD-10-CM | POA: Insufficient documentation

## 2016-06-16 NOTE — Progress Notes (Signed)
Radiation Oncology Follow up Note  Name: Dawn Allen   Date:   06/16/2016 MRN:  [id number] DOB: 1951/05/15                                 Old patient new area for right-sided breast cancer   This 65 y.o. female presents to the clinic today for evaluation of stage IIB (T3 N0 M0) triple negative invasive mammary carcinoma status post wide local excision and axillary lymph node dissection.  REFERRING PROVIDER: Warnell Forester*  HPI: Patient is a 65 year old female well-known to department having been treated with SB RT to her left upper lobe for stage I non-small cell lung cancer back in the end of 2017. I reconsult with her back in February 2017 which was found to have a 5 cm mass in the upper outer quadrant of the right breast with possible abnormal right axillary lymph nodes. Biopsy was positive for triple negative invasive mammary carcinoma lymph node was negative. She underwent neoadjuvant chemotherapy. Using Cytoxan and Adriamycin and 12 weeks of carbotaxol. She then underwent a wide local excision showing residual 5.2 cm invasive mammary carcinoma triple negative with overall grade of 3. 2 sentinel lymph nodes were negative for metastatic disease. Margins clear at 5 mm. Tumor again was triple negative. She's had some swelling of her right breast after the lumpectomy is wearing a compression brock based on surgeon's advice. She otherwise specifically denies cough or bone pain. She's had no residual problems from her left lung SB RT. She is seen today for radiation oncology opinion.  COMPLICATIONS OF TREATMENT: none  FOLLOW UP COMPLIANCE: keeps appointments   PHYSICAL EXAM:  BP (!) 145/81   Pulse 91   Temp 98.6 F (37 C)   Resp 20   Wt 180 lb 8.9 oz (81.9 kg)   BMI 30.05 kg/m  Patient has a fairly large seroma present in the right lateral breast. Otherwise there is no dominant mass or nodularity noted in either breast in 2 positions examined. No axillary or supra clavicular  adenopathy is appreciated. Well-developed well-nourished patient in NAD. HEENT reveals PERLA, EOMI, discs not visualized.  Oral cavity is clear. No oral mucosal lesions are identified. Neck is clear without evidence of cervical or supraclavicular adenopathy. Lungs are clear to A&P. Cardiac examination is essentially unremarkable with regular rate and rhythm without murmur rub or thrill. Abdomen is benign with no organomegaly or masses noted. Motor sensory and DTR levels are equal and symmetric in the upper and lower extremities. Cranial nerves II through XII are grossly intact. Proprioception is intact. No peripheral adenopathy or edema is identified. No motor or sensory levels are noted. Crude visual fields are within normal range.  RADIOLOGY RESULTS: Mammogram ultrasound CT scans are reviewed  PLAN: At this time I to go ahead with right breast and peripheral lymphatic radiation therapy. I would treat her peripheral lymphatics even know she was sentinel node negative based on the aggressive nature of triple negative disease as well as the lesion being over 5 cm. These are poor prognostic signs and favor lymph node irradiation. I would treat her whole breast and peripheral lymphatics to 5040 cGy in 28 fractions boosting her scar another 1400 cGy using electron beam. Risks and benefits of treatment including possibility of lymphedema of her right upper extremity for which I've advocated she exercise, skin reaction, fatigue, hyperpigmentation the skin, alteration of blood counts all were discussed  in detail with the patient. Also possible inclusion of superficial lung. I personally set up and ordered CT simulation on the patient for approximate 1 week time to allow some more resolution of her seroma.There will be extra effort by both professional staff as well as technical staff to coordinate and manage concurrent chemoradiation and ensuing side effects during her treatments.  I would like to take this  opportunity to thank you for allowing me to participate in the care of your patient.Armstead Peaks., MD

## 2016-06-17 ENCOUNTER — Telehealth: Payer: Self-pay

## 2016-06-17 NOTE — Telephone Encounter (Signed)
Patient calls in and is very anxious after seeing Dr. Donella Stade. She states that he is needing 36 treatments of radiation and that he feels like the cancer was not all taken out. She would like Dr. Azalee Course to call her and talk with her about this at [phone].

## 2016-06-17 NOTE — Telephone Encounter (Signed)
Spoke with Dr. Azalee Course regarding this patient. She is in agreement with Dr. Donette Larry plan and would like this relayed to the patient.   Returned call to patient at this time at requested number. No answer. Left voicemail for patient to return phone call.

## 2016-06-17 NOTE — Telephone Encounter (Signed)
Spoke with patient. I explained that Dr. Azalee Course is in agreement with Dr. Donella Stade and that she will need to do the radiation since she chose her lumpectomy.   The patient is not happy about this but will talk with her friends at church and make the best decision for her.   She will follow-up as scheduled in November with Dr. Azalee Course and discuss this further at that time.

## 2016-06-24 ENCOUNTER — Ambulatory Visit
Admission: RE | Admit: 2016-06-24 | Discharge: 2016-06-24 | Disposition: A | Discharge status: Home / Self Care | Payer: Medicare Other | Source: Ambulatory Visit | Attending: Radiation Oncology | Admitting: Radiation Oncology

## 2016-06-24 DIAGNOSIS — C3411 Malignant neoplasm of upper lobe, right bronchus or lung: Secondary | ICD-10-CM | POA: Diagnosis not present

## 2016-06-24 DIAGNOSIS — Z171 Estrogen receptor negative status [ER-]: Secondary | ICD-10-CM | POA: Diagnosis not present

## 2016-06-24 DIAGNOSIS — C50411 Malignant neoplasm of upper-outer quadrant of right female breast: Secondary | ICD-10-CM | POA: Diagnosis not present

## 2016-06-24 DIAGNOSIS — Z510 Encounter for antineoplastic radiation therapy: Secondary | ICD-10-CM | POA: Diagnosis not present

## 2016-06-24 NOTE — Progress Notes (Signed)
  Oncology Nurse Navigator Documentation Met with Dawn Allen per request. Psycho-social support provided. Navigator Location: [facility] (06/24/16 1100)   )Navigator Encounter Type: Lobby;Other (simulation) (06/24/16 1100)                     Patient Visit Type: Other (simulation) (06/24/16 1100) Treatment Phase: CT SIM (06/24/16 1100) Barriers/Navigation Needs: Education (06/24/16 1100)   Interventions: Psycho-social support (06/24/16 1100)                      Time Spent with Patient: 60 (06/24/16 1100)

## 2016-06-25 ENCOUNTER — Other Ambulatory Visit: Payer: Self-pay | Admitting: *Deleted

## 2016-06-25 DIAGNOSIS — Z171 Estrogen receptor negative status [ER-]: Principal | ICD-10-CM

## 2016-06-25 DIAGNOSIS — C50811 Malignant neoplasm of overlapping sites of right female breast: Secondary | ICD-10-CM

## 2016-06-28 ENCOUNTER — Telehealth: Payer: Self-pay

## 2016-06-28 NOTE — Telephone Encounter (Signed)
  Oncology Nurse Navigator Documentation Received call from Ms. Percell Miller. Went over upcoming appts regarding radiation with her.    Navigator Location: [facility] (06/28/16 1000)   )Navigator Encounter Type: Telephone (06/28/16 1000) Telephone: Appt Confirmation/Clarification (06/28/16 1000)                                                  Time Spent with Patient: 15 (06/28/16 1000)

## 2016-06-29 DIAGNOSIS — Z510 Encounter for antineoplastic radiation therapy: Secondary | ICD-10-CM | POA: Diagnosis not present

## 2016-07-01 ENCOUNTER — Ambulatory Visit: Payer: Medicare Other

## 2016-07-01 DIAGNOSIS — Z510 Encounter for antineoplastic radiation therapy: Secondary | ICD-10-CM | POA: Diagnosis not present

## 2016-07-02 ENCOUNTER — Ambulatory Visit
Admission: RE | Admit: 2016-07-02 | Discharge: 2016-07-02 | Disposition: A | Discharge status: Home / Self Care | Payer: Medicare Other | Source: Ambulatory Visit | Attending: Radiation Oncology | Admitting: Radiation Oncology

## 2016-07-02 DIAGNOSIS — Z510 Encounter for antineoplastic radiation therapy: Secondary | ICD-10-CM | POA: Diagnosis not present

## 2016-07-05 ENCOUNTER — Ambulatory Visit
Admission: RE | Admit: 2016-07-05 | Discharge: 2016-07-05 | Disposition: A | Discharge status: Home / Self Care | Payer: Medicare Other | Source: Ambulatory Visit | Attending: Radiation Oncology | Admitting: Radiation Oncology

## 2016-07-05 DIAGNOSIS — Z510 Encounter for antineoplastic radiation therapy: Secondary | ICD-10-CM | POA: Diagnosis not present

## 2016-07-06 ENCOUNTER — Telehealth: Payer: Self-pay

## 2016-07-06 ENCOUNTER — Ambulatory Visit
Admission: RE | Admit: 2016-07-06 | Discharge: 2016-07-06 | Disposition: A | Discharge status: Home / Self Care | Payer: Medicare Other | Source: Ambulatory Visit | Attending: Radiation Oncology | Admitting: Radiation Oncology

## 2016-07-06 ENCOUNTER — Encounter: Payer: Self-pay | Admitting: *Deleted

## 2016-07-06 DIAGNOSIS — Z510 Encounter for antineoplastic radiation therapy: Secondary | ICD-10-CM | POA: Diagnosis not present

## 2016-07-06 NOTE — Progress Notes (Signed)
Patient called today with complaints of a toothache.  States she has been taking everything for it.  States she has had over 4 BC's today.  Complains that her heart is racing.  Explained concern for taking so many BC's and also the combination of meds she is taking.  Informed her the racing heart could be from the caffeine.  Reviewed the dangers of taking too many BC's, and the risk of the combinations of drugs.  Gave her the number to [facility] since they will accept Medicaid.  We have tried in the past to help schedule the patient to be seen at [facility] dentistry and she refused to go.  She also refuses to go again today.  States she will let me know if she goes.  Will cc [name].

## 2016-07-06 NOTE — Telephone Encounter (Signed)
Started radiation treatment and her heart is racing because she is taking too many pain medications and she is still in pain. Patient said "she knows" and only wanted to speak with Amber. I let her know that I'll be sending this message to [employer] and she will contact the patient when she returns

## 2016-07-07 ENCOUNTER — Ambulatory Visit
Admission: RE | Admit: 2016-07-07 | Discharge: 2016-07-07 | Disposition: A | Discharge status: Home / Self Care | Payer: Medicare Other | Source: Ambulatory Visit | Attending: Radiation Oncology | Admitting: Radiation Oncology

## 2016-07-07 DIAGNOSIS — Z510 Encounter for antineoplastic radiation therapy: Secondary | ICD-10-CM | POA: Diagnosis not present

## 2016-07-08 ENCOUNTER — Ambulatory Visit
Admission: RE | Admit: 2016-07-08 | Discharge: 2016-07-08 | Disposition: A | Discharge status: Home / Self Care | Payer: Medicare Other | Source: Ambulatory Visit | Attending: Radiation Oncology | Admitting: Radiation Oncology

## 2016-07-08 DIAGNOSIS — Z510 Encounter for antineoplastic radiation therapy: Secondary | ICD-10-CM | POA: Diagnosis not present

## 2016-07-09 ENCOUNTER — Encounter: Payer: Self-pay | Admitting: Surgery

## 2016-07-09 ENCOUNTER — Ambulatory Visit (INDEPENDENT_AMBULATORY_CARE_PROVIDER_SITE_OTHER): Payer: Medicare Other | Admitting: Surgery

## 2016-07-09 ENCOUNTER — Ambulatory Visit
Admission: RE | Admit: 2016-07-09 | Discharge: 2016-07-09 | Disposition: A | Discharge status: Home / Self Care | Payer: Medicare Other | Source: Ambulatory Visit | Attending: Radiation Oncology | Admitting: Radiation Oncology

## 2016-07-09 VITALS — BP 118/79 | HR 85 | Temp 97.4°F | Ht 65.0 in | Wt 171.0 lb

## 2016-07-09 DIAGNOSIS — Z510 Encounter for antineoplastic radiation therapy: Secondary | ICD-10-CM | POA: Diagnosis not present

## 2016-07-09 DIAGNOSIS — Z171 Estrogen receptor negative status [ER-]: Secondary | ICD-10-CM

## 2016-07-09 DIAGNOSIS — G8928 Other chronic postprocedural pain: Secondary | ICD-10-CM

## 2016-07-09 DIAGNOSIS — C50811 Malignant neoplasm of overlapping sites of right female breast: Secondary | ICD-10-CM

## 2016-07-09 MED ORDER — CLINDAMYCIN HCL 300 MG PO CAPS: 300.0000 mg | ORAL_CAPSULE | Freq: Three times a day (TID) | ORAL | 0 refills | Status: DC

## 2016-07-09 MED ORDER — HYDROCODONE-ACETAMINOPHEN 5-325 MG PO TABS
1.0000 | ORAL_TABLET | Freq: Four times a day (QID) | ORAL | 0 refills | Status: DC | PRN
Start: 2016-07-09 — End: 2016-07-26

## 2016-07-09 MED ORDER — HYDROCORTISONE 1 % EX CREA: TOPICAL_CREAM | CUTANEOUS | 0 refills | Status: DC

## 2016-07-09 MED ORDER — AQUAPHOR ADVANCED THERAPY EX OINT: 1.0000 "application " | TOPICAL_OINTMENT | Freq: Two times a day (BID) | CUTANEOUS | 0 refills | Status: DC

## 2016-07-09 NOTE — Progress Notes (Signed)
65 year old lady well-known to the surgery service with large right-sided breast cancer status post margin excision. The patient has begun her radiation treatments and she is having some pain and tenderness along the incision site. She had been instructed to keep compression on the area that feels better for her even after starting the radiation treatments. I did discuss with her that is perfectly fine to keep the compression dressings that feels better to the area. The patient still is having significant pain in the osmolar been giving her issues. First try to get her to a dentist as noted by note from [facility] however had some difficulty due to her Elloquis and  her Medicaid status.   Vitals:   07/09/16 0947  BP: 118/79  Pulse: 85  Temp: 97.4 F (36.3 C)   PE:  Gen: NAD Right breast: incision well healed, still some induration and tenderness, mild hyperemia from radiation treatment   A/P:  She denied again reviewed the importance of continuing all 36 of her radiation treatments which she is willing to take. I have ordered a refill of her clindamycin for her tooth and we will again attempt to get her into a dentist office. Due to the amount of pain she is having I have given her short-term refill of Norco as well. I will see her when she finishes her radiation treatments in about 5-6 weeks.

## 2016-07-09 NOTE — Patient Instructions (Signed)
Your Lotion, hydrocortisone cream, Norco, and antibiotic to your pharmacy.  We will talk with the nurse navigators and arrange a dentist appointment soon.  Please call me with any questions or concerns prior to your appointment.

## 2016-07-12 ENCOUNTER — Ambulatory Visit
Admission: RE | Admit: 2016-07-12 | Discharge: 2016-07-12 | Disposition: A | Discharge status: Home / Self Care | Payer: Medicare Other | Source: Ambulatory Visit | Attending: Radiation Oncology | Admitting: Radiation Oncology

## 2016-07-12 DIAGNOSIS — Z510 Encounter for antineoplastic radiation therapy: Secondary | ICD-10-CM | POA: Diagnosis not present

## 2016-07-13 ENCOUNTER — Ambulatory Visit
Admission: RE | Admit: 2016-07-13 | Discharge: 2016-07-13 | Disposition: A | Discharge status: Home / Self Care | Payer: Medicare Other | Source: Ambulatory Visit | Attending: Radiation Oncology | Admitting: Radiation Oncology

## 2016-07-13 DIAGNOSIS — Z510 Encounter for antineoplastic radiation therapy: Secondary | ICD-10-CM | POA: Diagnosis not present

## 2016-07-14 ENCOUNTER — Ambulatory Visit
Admission: RE | Admit: 2016-07-14 | Discharge: 2016-07-14 | Disposition: A | Discharge status: Home / Self Care | Payer: Medicare Other | Source: Ambulatory Visit | Attending: Radiation Oncology | Admitting: Radiation Oncology

## 2016-07-14 ENCOUNTER — Telehealth: Payer: Self-pay | Admitting: Surgery

## 2016-07-14 ENCOUNTER — Other Ambulatory Visit: Payer: Self-pay | Admitting: *Deleted

## 2016-07-14 DIAGNOSIS — R0781 Pleurodynia: Secondary | ICD-10-CM

## 2016-07-14 DIAGNOSIS — Z510 Encounter for antineoplastic radiation therapy: Secondary | ICD-10-CM | POA: Diagnosis not present

## 2016-07-14 NOTE — Telephone Encounter (Signed)
Patient has called and stated that she fell today at the grocery store. She states that her foot got hung under the shopping cart and she fell on her right side. She complains that she is in a lot of pain. I have advised her to go the the ED or to be seen by her PCP as she may have some internal injury or bruising. I also advised her that we could not prescribe her any pain medication for this , that she will need to be evaluated to rule out any underline injury from her fall. The patient then hung up. I have advised our Producer, television/film/video, RN.

## 2016-07-19 ENCOUNTER — Ambulatory Visit: Payer: Medicare Other

## 2016-07-20 ENCOUNTER — Ambulatory Visit
Admission: RE | Admit: 2016-07-20 | Discharge: 2016-07-20 | Disposition: A | Discharge status: Home / Self Care | Payer: Medicare Other | Source: Ambulatory Visit | Attending: Radiation Oncology | Admitting: Radiation Oncology

## 2016-07-20 ENCOUNTER — Inpatient Hospital Stay: Payer: Medicare Other | Attending: Internal Medicine

## 2016-07-20 DIAGNOSIS — Z510 Encounter for antineoplastic radiation therapy: Secondary | ICD-10-CM | POA: Diagnosis not present

## 2016-07-21 ENCOUNTER — Ambulatory Visit
Admission: RE | Admit: 2016-07-21 | Discharge: 2016-07-21 | Disposition: A | Discharge status: Home / Self Care | Payer: Medicare Other | Source: Ambulatory Visit | Attending: Radiation Oncology | Admitting: Radiation Oncology

## 2016-07-21 DIAGNOSIS — Z510 Encounter for antineoplastic radiation therapy: Secondary | ICD-10-CM | POA: Diagnosis not present

## 2016-07-22 ENCOUNTER — Ambulatory Visit
Admission: RE | Admit: 2016-07-22 | Discharge: 2016-07-22 | Disposition: A | Discharge status: Home / Self Care | Payer: Medicare Other | Source: Ambulatory Visit | Attending: Radiation Oncology | Admitting: Radiation Oncology

## 2016-07-22 DIAGNOSIS — Z510 Encounter for antineoplastic radiation therapy: Secondary | ICD-10-CM | POA: Diagnosis not present

## 2016-07-23 ENCOUNTER — Ambulatory Visit
Admission: RE | Admit: 2016-07-23 | Discharge: 2016-07-23 | Disposition: A | Discharge status: Home / Self Care | Payer: Medicare Other | Source: Ambulatory Visit | Attending: Radiation Oncology | Admitting: Radiation Oncology

## 2016-07-23 ENCOUNTER — Other Ambulatory Visit: Payer: Self-pay | Admitting: *Deleted

## 2016-07-23 DIAGNOSIS — C3411 Malignant neoplasm of upper lobe, right bronchus or lung: Secondary | ICD-10-CM | POA: Diagnosis not present

## 2016-07-23 DIAGNOSIS — Z171 Estrogen receptor negative status [ER-]: Secondary | ICD-10-CM | POA: Diagnosis not present

## 2016-07-23 DIAGNOSIS — C50411 Malignant neoplasm of upper-outer quadrant of right female breast: Secondary | ICD-10-CM | POA: Diagnosis not present

## 2016-07-23 DIAGNOSIS — Z510 Encounter for antineoplastic radiation therapy: Secondary | ICD-10-CM | POA: Diagnosis present

## 2016-07-23 MED ORDER — NYSTATIN 100000 UNIT/GM EX POWD: Freq: Three times a day (TID) | CUTANEOUS | 0 refills | Status: DC

## 2016-07-26 ENCOUNTER — Other Ambulatory Visit: Payer: Self-pay | Admitting: *Deleted

## 2016-07-26 ENCOUNTER — Ambulatory Visit
Admission: RE | Admit: 2016-07-26 | Discharge: 2016-07-26 | Disposition: A | Discharge status: Home / Self Care | Payer: Medicare Other | Source: Ambulatory Visit | Attending: Radiation Oncology | Admitting: Radiation Oncology

## 2016-07-26 DIAGNOSIS — Z510 Encounter for antineoplastic radiation therapy: Secondary | ICD-10-CM | POA: Diagnosis not present

## 2016-07-26 DIAGNOSIS — G8928 Other chronic postprocedural pain: Secondary | ICD-10-CM

## 2016-07-26 MED ORDER — HYDROCODONE-ACETAMINOPHEN 5-325 MG PO TABS: 1.0000 | ORAL_TABLET | Freq: Four times a day (QID) | ORAL | 0 refills | Status: DC | PRN

## 2016-07-27 ENCOUNTER — Ambulatory Visit
Admission: RE | Admit: 2016-07-27 | Discharge: 2016-07-27 | Disposition: A | Discharge status: Home / Self Care | Payer: Medicare Other | Source: Ambulatory Visit | Attending: Radiation Oncology | Admitting: Radiation Oncology

## 2016-07-27 ENCOUNTER — Telehealth: Payer: Self-pay

## 2016-07-27 DIAGNOSIS — Z510 Encounter for antineoplastic radiation therapy: Secondary | ICD-10-CM | POA: Diagnosis not present

## 2016-07-27 NOTE — Telephone Encounter (Signed)
Patient called in and states that she is having terrible tooth pain once again.   I called [facility], as they take Medicaid. Patient was scheduled for an appointment on 08/05/16 at 0900am.   She will need to be off of her Eliquis for 3 days prior. Anticoagulant clearance needed from PCP, Threasa Alpha, NP. Call was made to [facility] at this time. No answer. Left voicemail to return phone call in regards to Eliquis.  Patient was notified of her appointment. She will need transportation set-up however.

## 2016-07-28 ENCOUNTER — Ambulatory Visit
Admission: RE | Admit: 2016-07-28 | Discharge: 2016-07-28 | Disposition: A | Discharge status: Home / Self Care | Payer: Medicare Other | Source: Ambulatory Visit | Attending: Radiation Oncology | Admitting: Radiation Oncology

## 2016-07-28 DIAGNOSIS — Z510 Encounter for antineoplastic radiation therapy: Secondary | ICD-10-CM | POA: Diagnosis not present

## 2016-07-29 ENCOUNTER — Ambulatory Visit: Admission: RE | Admit: 2016-07-29 | Payer: Medicare Other | Source: Ambulatory Visit

## 2016-07-29 ENCOUNTER — Other Ambulatory Visit: Payer: Self-pay | Admitting: *Deleted

## 2016-07-29 MED ORDER — SILVER SULFADIAZINE 1 % EX CREA: 1.0000 "application " | TOPICAL_CREAM | Freq: Two times a day (BID) | CUTANEOUS | 2 refills | Status: DC

## 2016-07-29 NOTE — Telephone Encounter (Signed)
Spoke with Vita Barley (Nurse Navigator) at this time whom will arrange transportation for patient's appointment.

## 2016-07-30 ENCOUNTER — Ambulatory Visit: Payer: Medicare Other

## 2016-08-02 ENCOUNTER — Ambulatory Visit
Admission: RE | Admit: 2016-08-02 | Discharge: 2016-08-02 | Disposition: A | Discharge status: Home / Self Care | Payer: Medicare Other | Source: Ambulatory Visit | Attending: Radiation Oncology | Admitting: Radiation Oncology

## 2016-08-02 ENCOUNTER — Telehealth: Payer: Self-pay

## 2016-08-02 DIAGNOSIS — Z510 Encounter for antineoplastic radiation therapy: Secondary | ICD-10-CM | POA: Diagnosis not present

## 2016-08-02 NOTE — Telephone Encounter (Signed)
Clearance to stop Anti-Coagulant for surgery is obtained at this time and will be scanned under Media. Patient has permission to stop Eliquis for 5 days.

## 2016-08-03 ENCOUNTER — Ambulatory Visit: Payer: Medicare Other | Admitting: Internal Medicine

## 2016-08-03 ENCOUNTER — Ambulatory Visit
Admission: RE | Admit: 2016-08-03 | Discharge: 2016-08-03 | Disposition: A | Discharge status: Home / Self Care | Payer: Medicare Other | Source: Ambulatory Visit | Attending: Radiation Oncology | Admitting: Radiation Oncology

## 2016-08-03 ENCOUNTER — Other Ambulatory Visit: Payer: Self-pay | Admitting: *Deleted

## 2016-08-03 ENCOUNTER — Other Ambulatory Visit: Payer: Medicare Other

## 2016-08-03 DIAGNOSIS — Z510 Encounter for antineoplastic radiation therapy: Secondary | ICD-10-CM | POA: Diagnosis not present

## 2016-08-03 DIAGNOSIS — G8928 Other chronic postprocedural pain: Secondary | ICD-10-CM

## 2016-08-03 MED ORDER — HYDROCODONE-ACETAMINOPHEN 5-325 MG PO TABS: 1.0000 | ORAL_TABLET | Freq: Four times a day (QID) | ORAL | 0 refills | Status: DC | PRN

## 2016-08-03 MED ORDER — CLINDAMYCIN HCL 300 MG PO CAPS: 300.0000 mg | ORAL_CAPSULE | Freq: Three times a day (TID) | ORAL | 0 refills | Status: DC

## 2016-08-04 ENCOUNTER — Inpatient Hospital Stay: Payer: Medicare Other | Attending: Internal Medicine

## 2016-08-04 ENCOUNTER — Ambulatory Visit: Payer: Medicare Other

## 2016-08-04 ENCOUNTER — Inpatient Hospital Stay (HOSPITAL_BASED_OUTPATIENT_CLINIC_OR_DEPARTMENT_OTHER): Payer: Medicare Other | Admitting: Internal Medicine

## 2016-08-04 ENCOUNTER — Ambulatory Visit
Admission: RE | Admit: 2016-08-04 | Discharge: 2016-08-04 | Disposition: A | Discharge status: Home / Self Care | Payer: Medicare Other | Source: Ambulatory Visit | Attending: Radiation Oncology | Admitting: Radiation Oncology

## 2016-08-04 VITALS — BP 151/93 | HR 101 | Temp 97.9°F | Resp 18 | Wt 172.4 lb

## 2016-08-04 DIAGNOSIS — Z9221 Personal history of antineoplastic chemotherapy: Secondary | ICD-10-CM

## 2016-08-04 DIAGNOSIS — I2510 Atherosclerotic heart disease of native coronary artery without angina pectoris: Secondary | ICD-10-CM | POA: Diagnosis not present

## 2016-08-04 DIAGNOSIS — L598 Other specified disorders of the skin and subcutaneous tissue related to radiation: Secondary | ICD-10-CM | POA: Diagnosis not present

## 2016-08-04 DIAGNOSIS — C3412 Malignant neoplasm of upper lobe, left bronchus or lung: Secondary | ICD-10-CM | POA: Insufficient documentation

## 2016-08-04 DIAGNOSIS — Z86718 Personal history of other venous thrombosis and embolism: Secondary | ICD-10-CM | POA: Insufficient documentation

## 2016-08-04 DIAGNOSIS — Z853 Personal history of malignant neoplasm of breast: Secondary | ICD-10-CM | POA: Insufficient documentation

## 2016-08-04 DIAGNOSIS — Z7901 Long term (current) use of anticoagulants: Secondary | ICD-10-CM | POA: Insufficient documentation

## 2016-08-04 DIAGNOSIS — F17210 Nicotine dependence, cigarettes, uncomplicated: Secondary | ICD-10-CM | POA: Insufficient documentation

## 2016-08-04 DIAGNOSIS — F419 Anxiety disorder, unspecified: Secondary | ICD-10-CM | POA: Diagnosis not present

## 2016-08-04 DIAGNOSIS — Z79899 Other long term (current) drug therapy: Secondary | ICD-10-CM | POA: Insufficient documentation

## 2016-08-04 DIAGNOSIS — Z9011 Acquired absence of right breast and nipple: Secondary | ICD-10-CM | POA: Insufficient documentation

## 2016-08-04 DIAGNOSIS — E119 Type 2 diabetes mellitus without complications: Secondary | ICD-10-CM

## 2016-08-04 DIAGNOSIS — E041 Nontoxic single thyroid nodule: Secondary | ICD-10-CM | POA: Diagnosis not present

## 2016-08-04 DIAGNOSIS — G40909 Epilepsy, unspecified, not intractable, without status epilepticus: Secondary | ICD-10-CM | POA: Insufficient documentation

## 2016-08-04 DIAGNOSIS — I252 Old myocardial infarction: Secondary | ICD-10-CM | POA: Insufficient documentation

## 2016-08-04 DIAGNOSIS — G8928 Other chronic postprocedural pain: Secondary | ICD-10-CM

## 2016-08-04 DIAGNOSIS — I10 Essential (primary) hypertension: Secondary | ICD-10-CM | POA: Insufficient documentation

## 2016-08-04 DIAGNOSIS — C50811 Malignant neoplasm of overlapping sites of right female breast: Secondary | ICD-10-CM

## 2016-08-04 DIAGNOSIS — Z171 Estrogen receptor negative status [ER-]: Secondary | ICD-10-CM

## 2016-08-04 DIAGNOSIS — K5900 Constipation, unspecified: Secondary | ICD-10-CM

## 2016-08-04 DIAGNOSIS — E785 Hyperlipidemia, unspecified: Secondary | ICD-10-CM

## 2016-08-04 DIAGNOSIS — Z923 Personal history of irradiation: Secondary | ICD-10-CM | POA: Diagnosis not present

## 2016-08-04 DIAGNOSIS — G8929 Other chronic pain: Secondary | ICD-10-CM | POA: Insufficient documentation

## 2016-08-04 DIAGNOSIS — Z510 Encounter for antineoplastic radiation therapy: Secondary | ICD-10-CM | POA: Diagnosis not present

## 2016-08-04 DIAGNOSIS — Z85850 Personal history of malignant neoplasm of thyroid: Secondary | ICD-10-CM | POA: Diagnosis not present

## 2016-08-04 LAB — CBC WITH DIFFERENTIAL/PLATELET
BASOS ABS: 0 10*3/uL (ref 0–0.1)
Basophils Relative: 1 %
EOS ABS: 0.3 10*3/uL (ref 0–0.7)
EOS PCT: 5 %
HCT: 43 % (ref 35.0–47.0)
Hemoglobin: 14.4 g/dL (ref 12.0–16.0)
LYMPHS PCT: 25 %
Lymphs Abs: 1.2 10*3/uL (ref 1.0–3.6)
MCH: 28.6 pg (ref 26.0–34.0)
MCHC: 33.6 g/dL (ref 32.0–36.0)
MCV: 85.2 fL (ref 80.0–100.0)
MONO ABS: 0.5 10*3/uL (ref 0.2–0.9)
Monocytes Relative: 9 %
Neutro Abs: 3 10*3/uL (ref 1.4–6.5)
Neutrophils Relative %: 60 %
PLATELETS: 145 10*3/uL — AB (ref 150–440)
RBC: 5.04 MIL/uL (ref 3.80–5.20)
RDW: 16.8 % — AB (ref 11.5–14.5)
WBC: 5 10*3/uL (ref 3.6–11.0)

## 2016-08-04 LAB — COMPREHENSIVE METABOLIC PANEL
ALT: 13 U/L — ABNORMAL LOW (ref 14–54)
AST: 19 U/L (ref 15–41)
Albumin: 3.5 g/dL (ref 3.5–5.0)
Alkaline Phosphatase: 209 U/L — ABNORMAL HIGH (ref 38–126)
Anion gap: 8 (ref 5–15)
BUN: 31 mg/dL — ABNORMAL HIGH (ref 6–20)
CHLORIDE: 98 mmol/L — AB (ref 101–111)
CO2: 31 mmol/L (ref 22–32)
Calcium: 9.3 mg/dL (ref 8.9–10.3)
Creatinine, Ser: 0.9 mg/dL (ref 0.44–1.00)
Glucose, Bld: 183 mg/dL — ABNORMAL HIGH (ref 65–99)
POTASSIUM: 4 mmol/L (ref 3.5–5.1)
Sodium: 137 mmol/L (ref 135–145)
Total Bilirubin: 0.3 mg/dL (ref 0.3–1.2)
Total Protein: 7.7 g/dL (ref 6.5–8.1)

## 2016-08-04 MED ORDER — HYDROCODONE-ACETAMINOPHEN 5-325 MG PO TABS
1.0000 | ORAL_TABLET | Freq: Three times a day (TID) | ORAL | 0 refills | Status: DC | PRN
Start: 2016-08-04 — End: 2016-10-20

## 2016-08-04 NOTE — Assessment & Plan Note (Signed)
#  Breast Ca- ER/PR- Neg; Her2-NEG. T3N0- STAGE II; triple negative- s/p neo-adjuvant chemotherapy; s/p Lumpectomy &  pTNM: ypT3 ypN0 (sn). Currently undergoing postlumpectomy radiation. Clinically no evidence of recurrence.  # stage I lung cancer status post  Radiation; January 2016. Await the repeat PET scan  # Tooth extraction planned- see discussion below  # DVT bil- on Elquis; on hold for above extraction; re-start 3 days after tooth extraction.   # Radiation dermatitis- G-1; on aquaphor.   # Chronic pain/and pain from breast surgery- continue hydrocodone1 every 8 hours as needed. Prescription given.  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 Will await repeat PET scan; and refer back to ENT.   # port flush every 8 weeks; will need port flush.   # follow-up end of February/March 2018; labs PET scan prior.  

## 2016-08-04 NOTE — Progress Notes (Signed)
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   # Smoker; ? Paranoia; Feb 2017- MUGA scan- 67%     Carcinoma of overlapping sites of right breast in female, estrogen receptor negative ([facility]   INTERVAL HISTORY: Poor- vague historian  [age]ear old female patient with above history of newly diagnosed breast cancer- triple negative; clinical stage II- currently on neo-adjuvant Chemotherapy followed by surgery currently undergoing radiation is here for follow up.   Patient complains of radiation changes/skin burn. Pain at the site of the port. This is chronic.She continues to be on hydrocodone for pain. Complains of constipation.   Otherwise patient denies any unusual shortness of breath or chest pain or cough.   REVIEW OF SYSTEMS: Difficult to assess given as patient is a poor historian.  PAST MEDICAL HISTORY :  Past Medical History:  Diagnosis Date  . Anxiety   . Breast cancer ([facility] [place]   right breast, chemo  . Cancer of right female breast (HCV[facility] [place]  . Coronary artery disease    a. NSTEMI cath 08/02/14: LM nl, pLAD 50%, mLCx 30%, mRCA 95% s/p PCI/DES, 2nd lesion 40%, EF 55%  . Diabetes mellitus without complication ([facility]  . DVT (deep venous thrombosis) (HCC)    LEFT LEG   . HLD (hyperlipidemia)   . Hypertension   . Lung nodule   . MI (myocardial infarction)   . Seizure disorder ([facility]  . Seizures  ([facility]  . Squamous cell lung cancer ([facility]8/[place]/2016    PAST SURGICAL HISTORY :   Past Surgical History:  Procedure Laterality Date  . ABDOMINAL HYSTERECTOMY     PARTIAL   . AXILLARY LYMPH NODE BIOPSY Right 05/26/2016   Procedure: AXILLARY LYMPH NODE BIOPSY;  Surgeon: CaHubbard RobinsonMD;  Location: [facility] ORS;  Service: General;  Laterality: Right;  . BREAST BIOPSY Right 09/15/2015   positve  . CARDIAC CATHETERIZATION  08/02/2014  . CORONARY ANGIOPLASTY  08/02/2014   drug eluting stent placement  . IVC FILTER PLACEMENT ([facility] HX)    . LEG SURGERY Right    BLOOD CLOT  . MASTECTOMY, PARTIAL Right 05/26/2016   Procedure: MASTECTOMY PARTIAL;  Surgeon: CaHubbard RobinsonMD;  Location: [facility] ORS;  Service: General;  Laterality: Right;  . PORTACATH PLACEMENT Left 10/13/2015   Procedure: INSERTION PORT-A-CATH;  Surgeon: CaHubbard RobinsonMD;  Location: [facility] ORS;  Service: General;  Laterality: Left;  . SENTINEL NODE BIOPSY Right 05/26/2016   Procedure: SENTINEL NODE BIOPSY;  Surgeon: CaHubbard RobinsonMD;  Location: [facility] ORS;  Service: General;  Laterality: Right;    FAMILY HISTORY :   Family History  Problem Relation Age of Onset  . Heart attack Mother   . Breast cancer Neg Hx     SOCIAL HISTORY:   Social History  Substance Use Topics  . Smoking status: Current Every Day  Smoker    Packs/day: 1.00    Years: 45.00    Types: Cigarettes  . Smokeless tobacco: Never Used  . Alcohol use No    ALLERGIES:  is allergic to penicillins.  MEDICATIONS:  Current Outpatient Prescriptions  Medication Sig Dispense Refill  . apixaban (ELIQUIS) 2.5 MG TABS tablet Take 1 tablet (2.5 mg total) by mouth 2 (two) times daily. Reported on 01/01/2016 60 tablet 0  . Aspirin-Salicylamide-Caffeine (BC HEADACHE POWDER PO) Take 1 packet by mouth daily.    Marland Kitchen atorvastatin (LIPITOR) 40 MG tablet Take 1 tablet by mouth daily.    . clindamycin (CLEOCIN) 300 MG capsule Take 1 capsule (300 mg total) by mouth 3  (three) times daily. 42 capsule 0  . Emollient (AQUAPHOR ADVANCED THERAPY) OINT Apply 1 application topically 2 (two) times daily. 85 g 0  . empagliflozin (JARDIANCE) 25 MG TABS tablet Take 25 mg by mouth daily.    Marland Kitchen HYDROcodone-acetaminophen (NORCO/VICODIN) 5-325 MG tablet Take 1 tablet by mouth every 8 (eight) hours as needed for severe pain. 40 tablet 0  . hydrocortisone cream 1 % Apply to affected area 2 times daily 56 g 0  . lidocaine-prilocaine (EMLA) cream Apply 1 application topically as needed. Apply to port a cath site 1 hour before chemotherapy treatment. 30 g 6  . lisinopril-hydrochlorothiazide (PRINZIDE,ZESTORETIC) 20-25 MG per tablet Take 1 tablet by mouth daily.     . naproxen (NAPROSYN) 500 MG tablet Take 1 tablet by mouth 2 (two) times daily. Reported on 11/03/2015    . nystatin (MYCOSTATIN/NYSTOP) powder Apply topically 3 (three) times daily. 45 g 0  . ondansetron (ZOFRAN) 8 MG tablet Take 1 tablet (8 mg total) by mouth every 8 (eight) hours as needed for nausea or vomiting (start 3 days; after chemo). 40 tablet 0  . phenytoin (DILANTIN) 100 MG ER capsule Take 300 mg by mouth daily.     . pioglitazone (ACTOS) 45 MG tablet Take 1 tablet by mouth daily.    . silver sulfADIAZINE (SILVADENE) 1 % cream Apply 1 application topically 2 (two) times daily. 50 g 2  . VENTOLIN HFA 108 (90 Base) MCG/ACT inhaler Inhale 1 puff into the lungs every 6 (six) hours as needed. Reported on 01/20/2016    . polyethylene glycol powder (MIRALAX) powder Take 255 g by mouth once. Take as directed 255 g 0   No current facility-administered medications for this visit.    Facility-Administered Medications Ordered in Other Visits  Medication Dose Route Frequency Provider Last Rate Last Dose  . albuterol (PROVENTIL) (2.5 MG/3ML) 0.083% nebulizer solution 2.5 mg  2.5 mg Nebulization Once Nestor Lewandowsky, MD      . sodium chloride flush (NS) 0.9 % injection 10 mL  10 mL Intravenous PRN Cammie Sickle, MD   10  mL at 12/01/15 0850    PHYSICAL EXAMINATION: ECOG PERFORMANCE STATUS: 0 - Asymptomatic  BP (!) 151/93 (BP Location: Left Arm, Patient Position: Sitting)   Pulse (!) 101   Temp 97.9 F (36.6 C) (Tympanic)   Resp 18   Wt 172 lb 6.4 oz (78.2 kg)   BMI 28.69 kg/m   Filed Weights   08/04/16 1550  Weight: 172 lb 6.4 oz (78.2 kg)    GENERAL: Well-nourished well-developed; Alert, no distress and comfortable. Alone;  EYES: no pallor or icterus OROPHARYNX: no thrush or ulceration; right lower jaw molar- tenderness; no pus noted.  NECK: supple, no masses felt LYMPH: no palpable lymphadenopathy in the cervical, axillary or  inguinal regions LUNGS: clear to auscultation and No wheeze or crackles HEART/CVS: regular rate & rhythm and no murmurs; No lower extremity edema ABDOMEN:abdomen soft, non-tender and normal bowel sounds Musculoskeletal:no cyanosis of digits and no clubbing  PSYCH: alert & oriented x 3 with fluent speech NEURO: no focal motor/sensory deficits Skin- radiation changes noted in the area of the breast and neck.   LABORATORY DATA:  I have reviewed the data as listed    Component Value Date/Time   NA 137 08/04/2016 1540   NA 141 08/03/2014 0050   K 4.0 08/04/2016 1540   K 3.3 (L) 08/03/2014 0050   CL 98 (L) 08/04/2016 1540   CL 108 (H) 08/03/2014 0050   CO2 31 08/04/2016 1540   CO2 27 08/03/2014 0050   GLUCOSE 183 (H) 08/04/2016 1540   GLUCOSE 164 (H) 08/03/2014 0050   BUN 31 (H) 08/04/2016 1540   BUN 14 08/03/2014 0050   CREATININE 0.90 08/04/2016 1540   CREATININE 0.68 08/03/2014 0050   CALCIUM 9.3 08/04/2016 1540   CALCIUM 8.2 (L) 08/03/2014 0050   PROT 7.7 08/04/2016 1540   PROT 6.7 04/11/2013 2241   ALBUMIN 3.5 08/04/2016 1540   ALBUMIN 2.7 (L) 04/11/2013 2241   AST 19 08/04/2016 1540   AST 13 (L) 04/11/2013 2241   ALT 13 (L) 08/04/2016 1540   ALT 16 04/11/2013 2241   ALKPHOS 209 (H) 08/04/2016 1540   ALKPHOS 207 (H) 04/11/2013 2241   BILITOT  0.3 08/04/2016 1540   BILITOT 0.1 (L) 04/11/2013 2241   GFRNONAA >60 08/04/2016 1540   GFRNONAA >60 08/03/2014 0050   GFRNONAA >60 04/11/2013 2241   GFRAA >60 08/04/2016 1540   GFRAA >60 08/03/2014 0050   GFRAA >60 04/11/2013 2241    No results found for: SPEP, UPEP  Lab Results  Component Value Date   WBC 5.0 08/04/2016   NEUTROABS 3.0 08/04/2016   HGB 14.4 08/04/2016   HCT 43.0 08/04/2016   MCV 85.2 08/04/2016   PLT 145 (L) 08/04/2016      Chemistry      Component Value Date/Time   NA 137 08/04/2016 1540   NA 141 08/03/2014 0050   K 4.0 08/04/2016 1540   K 3.3 (L) 08/03/2014 0050   CL 98 (L) 08/04/2016 1540   CL 108 (H) 08/03/2014 0050   CO2 31 08/04/2016 1540   CO2 27 08/03/2014 0050   BUN 31 (H) 08/04/2016 1540   BUN 14 08/03/2014 0050   CREATININE 0.90 08/04/2016 1540   CREATININE 0.68 08/03/2014 0050      Component Value Date/Time   CALCIUM 9.3 08/04/2016 1540   CALCIUM 8.2 (L) 08/03/2014 0050   ALKPHOS 209 (H) 08/04/2016 1540   ALKPHOS 207 (H) 04/11/2013 2241   AST 19 08/04/2016 1540   AST 13 (L) 04/11/2013 2241   ALT 13 (L) 08/04/2016 1540   ALT 16 04/11/2013 2241   BILITOT 0.3 08/04/2016 1540   BILITOT 0.1 (L) 04/11/2013 2241        ASSESSMENT & PLAN:   Carcinoma of overlapping sites of right breast in female, estrogen receptor negative ([place]) # Breast Ca- ER/PR- Neg; Her2-NEG. T3N0- STAGE II; triple negative- s/p neo-adjuvant chemotherapy; s/p Lumpectomy &  pTNM: ypT3 ypN0 (sn). Currently undergoing postlumpectomy radiation. Clinically no evidence of recurrence.  # stage I lung cancer status post  Radiation; January 2016. Await the repeat PET scan  # Tooth extraction planned- see discussion below  # DVT bil- on Elquis; on hold  for above extraction; re-start 3 days after tooth extraction.   # Radiation dermatitis- G-1; on aquaphor.   # Chronic pain/and pain from breast surgery- continue hydrocodone1 every 8 hours as needed. Prescription  given.  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 Will await repeat PET scan; and refer back to ENT.   # port flush every 8 weeks; will need port flush.   # follow-up end of February/March 2018; labs PET scan prior.      Cammie Sickle, MD 08/04/2016 4:35 PM

## 2016-08-05 ENCOUNTER — Ambulatory Visit: Payer: Medicare Other

## 2016-08-06 ENCOUNTER — Ambulatory Visit: Payer: Medicare Other

## 2016-08-09 ENCOUNTER — Ambulatory Visit
Admission: RE | Admit: 2016-08-09 | Discharge: 2016-08-09 | Disposition: A | Discharge status: Home / Self Care | Payer: Medicare Other | Source: Ambulatory Visit | Attending: Radiation Oncology | Admitting: Radiation Oncology

## 2016-08-09 DIAGNOSIS — Z510 Encounter for antineoplastic radiation therapy: Secondary | ICD-10-CM | POA: Diagnosis not present

## 2016-08-10 ENCOUNTER — Ambulatory Visit
Admission: RE | Admit: 2016-08-10 | Discharge: 2016-08-10 | Disposition: A | Discharge status: Home / Self Care | Payer: Medicare Other | Source: Ambulatory Visit | Attending: Radiation Oncology | Admitting: Radiation Oncology

## 2016-08-10 DIAGNOSIS — Z510 Encounter for antineoplastic radiation therapy: Secondary | ICD-10-CM | POA: Diagnosis not present

## 2016-08-11 ENCOUNTER — Ambulatory Visit
Admission: RE | Admit: 2016-08-11 | Discharge: 2016-08-11 | Disposition: A | Discharge status: Home / Self Care | Payer: Medicare Other | Source: Ambulatory Visit | Attending: Radiation Oncology | Admitting: Radiation Oncology

## 2016-08-11 DIAGNOSIS — Z510 Encounter for antineoplastic radiation therapy: Secondary | ICD-10-CM | POA: Diagnosis not present

## 2016-08-12 ENCOUNTER — Other Ambulatory Visit: Payer: Self-pay | Admitting: *Deleted

## 2016-08-12 ENCOUNTER — Ambulatory Visit
Admission: RE | Admit: 2016-08-12 | Discharge: 2016-08-12 | Disposition: A | Discharge status: Home / Self Care | Payer: Medicare Other | Source: Ambulatory Visit | Attending: Radiation Oncology | Admitting: Radiation Oncology

## 2016-08-12 DIAGNOSIS — E118 Type 2 diabetes mellitus with unspecified complications: Secondary | ICD-10-CM

## 2016-08-12 DIAGNOSIS — C50811 Malignant neoplasm of overlapping sites of right female breast: Secondary | ICD-10-CM

## 2016-08-12 DIAGNOSIS — Z510 Encounter for antineoplastic radiation therapy: Secondary | ICD-10-CM | POA: Diagnosis not present

## 2016-08-12 DIAGNOSIS — Z171 Estrogen receptor negative status [ER-]: Principal | ICD-10-CM

## 2016-08-13 ENCOUNTER — Ambulatory Visit
Admission: RE | Admit: 2016-08-13 | Discharge: 2016-08-13 | Disposition: A | Discharge status: Home / Self Care | Payer: Medicare Other | Source: Ambulatory Visit | Attending: Radiation Oncology | Admitting: Radiation Oncology

## 2016-08-13 DIAGNOSIS — Z510 Encounter for antineoplastic radiation therapy: Secondary | ICD-10-CM | POA: Diagnosis not present

## 2016-08-17 ENCOUNTER — Inpatient Hospital Stay: Payer: Medicare Other

## 2016-08-17 ENCOUNTER — Other Ambulatory Visit: Payer: Self-pay | Admitting: *Deleted

## 2016-08-17 ENCOUNTER — Ambulatory Visit
Admission: RE | Admit: 2016-08-17 | Discharge: 2016-08-17 | Disposition: A | Discharge status: Home / Self Care | Payer: Medicare Other | Source: Ambulatory Visit | Attending: Radiation Oncology | Admitting: Radiation Oncology

## 2016-08-17 DIAGNOSIS — Z510 Encounter for antineoplastic radiation therapy: Secondary | ICD-10-CM | POA: Diagnosis not present

## 2016-08-17 MED ORDER — DIAZEPAM 5 MG PO TABS: 5.0000 mg | ORAL_TABLET | Freq: Two times a day (BID) | ORAL | 0 refills | Status: DC | PRN

## 2016-08-18 ENCOUNTER — Ambulatory Visit
Admission: RE | Admit: 2016-08-18 | Discharge: 2016-08-18 | Disposition: A | Discharge status: Home / Self Care | Payer: Medicare Other | Source: Ambulatory Visit | Attending: Radiation Oncology | Admitting: Radiation Oncology

## 2016-08-18 DIAGNOSIS — Z510 Encounter for antineoplastic radiation therapy: Secondary | ICD-10-CM | POA: Diagnosis not present

## 2016-08-19 ENCOUNTER — Inpatient Hospital Stay: Payer: Medicare Other

## 2016-08-19 ENCOUNTER — Ambulatory Visit
Admission: RE | Admit: 2016-08-19 | Discharge: 2016-08-19 | Disposition: A | Discharge status: Home / Self Care | Payer: Medicare Other | Source: Ambulatory Visit | Attending: Radiation Oncology | Admitting: Radiation Oncology

## 2016-08-19 DIAGNOSIS — Z171 Estrogen receptor negative status [ER-]: Principal | ICD-10-CM

## 2016-08-19 DIAGNOSIS — Z853 Personal history of malignant neoplasm of breast: Secondary | ICD-10-CM | POA: Diagnosis not present

## 2016-08-19 DIAGNOSIS — Z95828 Presence of other vascular implants and grafts: Secondary | ICD-10-CM

## 2016-08-19 DIAGNOSIS — E118 Type 2 diabetes mellitus with unspecified complications: Secondary | ICD-10-CM

## 2016-08-19 DIAGNOSIS — Z510 Encounter for antineoplastic radiation therapy: Secondary | ICD-10-CM | POA: Diagnosis not present

## 2016-08-19 DIAGNOSIS — C50811 Malignant neoplasm of overlapping sites of right female breast: Secondary | ICD-10-CM

## 2016-08-19 LAB — CBC
HCT: 39.7 % (ref 35.0–47.0)
Hemoglobin: 13.4 g/dL (ref 12.0–16.0)
MCH: 28.6 pg (ref 26.0–34.0)
MCHC: 33.7 g/dL (ref 32.0–36.0)
MCV: 84.8 fL (ref 80.0–100.0)
PLATELETS: 143 10*3/uL — AB (ref 150–440)
RBC: 4.68 MIL/uL (ref 3.80–5.20)
RDW: 17.4 % — AB (ref 11.5–14.5)
WBC: 4.9 10*3/uL (ref 3.6–11.0)

## 2016-08-19 LAB — BASIC METABOLIC PANEL
ANION GAP: 5 (ref 5–15)
BUN: 29 mg/dL — ABNORMAL HIGH (ref 6–20)
CALCIUM: 8.6 mg/dL — AB (ref 8.9–10.3)
CO2: 28 mmol/L (ref 22–32)
Chloride: 99 mmol/L — ABNORMAL LOW (ref 101–111)
Creatinine, Ser: 0.94 mg/dL (ref 0.44–1.00)
Glucose, Bld: 135 mg/dL — ABNORMAL HIGH (ref 65–99)
Potassium: 3.3 mmol/L — ABNORMAL LOW (ref 3.5–5.1)
SODIUM: 132 mmol/L — AB (ref 135–145)

## 2016-08-19 MED ORDER — HEPARIN SOD (PORK) LOCK FLUSH 100 UNIT/ML IV SOLN
500.0000 [IU] | Freq: Once | INTRAVENOUS | Status: AC
  Administered 2016-08-19: 500 [IU] via INTRAVENOUS

## 2016-08-19 MED ORDER — SODIUM CHLORIDE 0.9% FLUSH
10.0000 mL | INTRAVENOUS | Status: DC | PRN
  Administered 2016-08-19: 10 mL via INTRAVENOUS
  Filled 2016-08-19: qty 10

## 2016-08-20 ENCOUNTER — Ambulatory Visit
Admission: RE | Admit: 2016-08-20 | Discharge: 2016-08-20 | Disposition: A | Discharge status: Home / Self Care | Payer: Medicare Other | Source: Ambulatory Visit | Attending: Radiation Oncology | Admitting: Radiation Oncology

## 2016-08-20 DIAGNOSIS — Z510 Encounter for antineoplastic radiation therapy: Secondary | ICD-10-CM | POA: Diagnosis not present

## 2016-08-20 LAB — HEMOGLOBIN A1C
HEMOGLOBIN A1C: 7.3 % — AB (ref 4.8–5.6)
Mean Plasma Glucose: 163 mg/dL

## 2016-08-24 ENCOUNTER — Other Ambulatory Visit: Payer: Self-pay | Admitting: *Deleted

## 2016-08-24 ENCOUNTER — Telehealth: Payer: Self-pay | Admitting: Internal Medicine

## 2016-08-24 ENCOUNTER — Ambulatory Visit
Admission: RE | Admit: 2016-08-24 | Discharge: 2016-08-24 | Disposition: A | Discharge status: Home / Self Care | Payer: Medicare Other | Source: Ambulatory Visit | Attending: Radiation Oncology | Admitting: Radiation Oncology

## 2016-08-24 DIAGNOSIS — C3411 Malignant neoplasm of upper lobe, right bronchus or lung: Secondary | ICD-10-CM | POA: Diagnosis not present

## 2016-08-24 DIAGNOSIS — Z171 Estrogen receptor negative status [ER-]: Secondary | ICD-10-CM | POA: Diagnosis not present

## 2016-08-24 DIAGNOSIS — Z510 Encounter for antineoplastic radiation therapy: Secondary | ICD-10-CM | POA: Diagnosis present

## 2016-08-24 DIAGNOSIS — C50411 Malignant neoplasm of upper-outer quadrant of right female breast: Secondary | ICD-10-CM | POA: Diagnosis not present

## 2016-08-24 MED ORDER — HYDROCODONE-ACETAMINOPHEN 5-325 MG PO TABS: 1.0000 | ORAL_TABLET | Freq: Every evening | ORAL | 0 refills | Status: DC | PRN

## 2016-08-24 NOTE — Telephone Encounter (Signed)
Left vm that her phone call was being returned.

## 2016-08-24 NOTE — Telephone Encounter (Signed)
She would like Heather to call her back to discuss her nails growing much darker. She is concerned about it although she said she was told it might happen as a side effect of chemo. Please call: [phone]

## 2016-08-25 ENCOUNTER — Ambulatory Visit
Admission: RE | Admit: 2016-08-25 | Discharge: 2016-08-25 | Disposition: A | Discharge status: Home / Self Care | Payer: Medicare Other | Source: Ambulatory Visit | Attending: Radiation Oncology | Admitting: Radiation Oncology

## 2016-08-25 DIAGNOSIS — Z510 Encounter for antineoplastic radiation therapy: Secondary | ICD-10-CM | POA: Diagnosis not present

## 2016-08-25 NOTE — Telephone Encounter (Signed)
Patient returned phone call from RN. She wanted to be reassured that her nail bed discoloration was r/t to her previous chemotherapy treatments. She states that her nail beds on her fingernails are growing and the new growth does not have have any signs of discoloration. She reports that her toenails have not grown as "fast to my expectations." I reassured the pt that nail discoloration is a side effect of chemotherapy. I explained that it may take some time for her nail growth to occur. She thanked me for calling her and answering her questions.

## 2016-08-26 ENCOUNTER — Ambulatory Visit: Payer: Medicare Other

## 2016-08-27 ENCOUNTER — Ambulatory Visit
Admission: RE | Admit: 2016-08-27 | Discharge: 2016-08-27 | Disposition: A | Discharge status: Home / Self Care | Payer: Medicare Other | Source: Ambulatory Visit | Attending: Radiation Oncology | Admitting: Radiation Oncology

## 2016-08-27 ENCOUNTER — Ambulatory Visit: Payer: Medicare Other

## 2016-08-27 DIAGNOSIS — Z510 Encounter for antineoplastic radiation therapy: Secondary | ICD-10-CM | POA: Diagnosis not present

## 2016-08-30 ENCOUNTER — Other Ambulatory Visit: Payer: Self-pay | Admitting: *Deleted

## 2016-08-30 ENCOUNTER — Ambulatory Visit
Admission: RE | Admit: 2016-08-30 | Discharge: 2016-08-30 | Disposition: A | Discharge status: Home / Self Care | Payer: Medicare Other | Source: Ambulatory Visit | Attending: Radiation Oncology | Admitting: Radiation Oncology

## 2016-08-30 ENCOUNTER — Ambulatory Visit: Payer: Medicare Other

## 2016-08-30 DIAGNOSIS — Z510 Encounter for antineoplastic radiation therapy: Secondary | ICD-10-CM | POA: Diagnosis not present

## 2016-08-30 MED ORDER — DIAZEPAM 5 MG PO TABS: 5.0000 mg | ORAL_TABLET | Freq: Two times a day (BID) | ORAL | 0 refills | Status: DC | PRN

## 2016-08-31 ENCOUNTER — Ambulatory Visit
Admission: RE | Admit: 2016-08-31 | Discharge: 2016-08-31 | Disposition: A | Discharge status: Home / Self Care | Payer: Medicare Other | Source: Ambulatory Visit | Attending: Radiation Oncology | Admitting: Radiation Oncology

## 2016-08-31 DIAGNOSIS — Z510 Encounter for antineoplastic radiation therapy: Secondary | ICD-10-CM | POA: Diagnosis not present

## 2016-09-01 ENCOUNTER — Ambulatory Visit: Payer: Medicare Other

## 2016-09-01 ENCOUNTER — Ambulatory Visit
Admission: RE | Admit: 2016-09-01 | Discharge: 2016-09-01 | Disposition: A | Discharge status: Home / Self Care | Payer: Medicare Other | Source: Ambulatory Visit | Attending: Radiation Oncology | Admitting: Radiation Oncology

## 2016-09-01 DIAGNOSIS — Z510 Encounter for antineoplastic radiation therapy: Secondary | ICD-10-CM | POA: Diagnosis not present

## 2016-09-02 ENCOUNTER — Ambulatory Visit
Admission: RE | Admit: 2016-09-02 | Discharge: 2016-09-02 | Disposition: A | Discharge status: Home / Self Care | Payer: Medicare Other | Source: Ambulatory Visit | Attending: Radiation Oncology | Admitting: Radiation Oncology

## 2016-09-02 DIAGNOSIS — Z510 Encounter for antineoplastic radiation therapy: Secondary | ICD-10-CM | POA: Diagnosis not present

## 2016-09-03 ENCOUNTER — Ambulatory Visit: Payer: Medicare Other

## 2016-09-03 ENCOUNTER — Ambulatory Visit
Admission: RE | Admit: 2016-09-03 | Discharge: 2016-09-03 | Disposition: A | Discharge status: Home / Self Care | Payer: Medicare Other | Source: Ambulatory Visit | Attending: Radiation Oncology | Admitting: Radiation Oncology

## 2016-09-03 DIAGNOSIS — Z510 Encounter for antineoplastic radiation therapy: Secondary | ICD-10-CM | POA: Diagnosis not present

## 2016-09-06 ENCOUNTER — Ambulatory Visit
Admission: RE | Admit: 2016-09-06 | Discharge: 2016-09-06 | Disposition: A | Discharge status: Home / Self Care | Payer: Medicare Other | Source: Ambulatory Visit | Attending: Radiation Oncology | Admitting: Radiation Oncology

## 2016-09-06 DIAGNOSIS — Z510 Encounter for antineoplastic radiation therapy: Secondary | ICD-10-CM | POA: Diagnosis not present

## 2016-09-13 ENCOUNTER — Telehealth: Payer: Self-pay | Admitting: *Deleted

## 2016-09-13 NOTE — Telephone Encounter (Signed)
Received e-mail that patient would like me to call her. I left a VM on her phone. I will re- attempt to call her at a later time.

## 2016-09-15 ENCOUNTER — Ambulatory Visit: Payer: Self-pay | Admitting: Surgery

## 2016-09-16 ENCOUNTER — Ambulatory Visit: Payer: Medicare Other | Admitting: Surgery

## 2016-09-30 ENCOUNTER — Encounter: Payer: Self-pay | Admitting: Radiation Oncology

## 2016-09-30 ENCOUNTER — Ambulatory Visit
Admission: RE | Admit: 2016-09-30 | Discharge: 2016-09-30 | Disposition: A | Discharge status: Home / Self Care | Payer: Medicare Other | Source: Ambulatory Visit | Attending: Radiation Oncology | Admitting: Radiation Oncology

## 2016-09-30 ENCOUNTER — Other Ambulatory Visit: Payer: Self-pay | Admitting: *Deleted

## 2016-09-30 VITALS — BP 145/86 | HR 89 | Temp 96.4°F | Resp 20 | Wt 170.9 lb

## 2016-09-30 DIAGNOSIS — C50411 Malignant neoplasm of upper-outer quadrant of right female breast: Secondary | ICD-10-CM | POA: Diagnosis not present

## 2016-09-30 DIAGNOSIS — G4700 Insomnia, unspecified: Secondary | ICD-10-CM | POA: Insufficient documentation

## 2016-09-30 DIAGNOSIS — Z171 Estrogen receptor negative status [ER-]: Secondary | ICD-10-CM | POA: Diagnosis not present

## 2016-09-30 DIAGNOSIS — Z9221 Personal history of antineoplastic chemotherapy: Secondary | ICD-10-CM | POA: Insufficient documentation

## 2016-09-30 DIAGNOSIS — N644 Mastodynia: Secondary | ICD-10-CM | POA: Insufficient documentation

## 2016-09-30 DIAGNOSIS — C3412 Malignant neoplasm of upper lobe, left bronchus or lung: Secondary | ICD-10-CM | POA: Diagnosis not present

## 2016-09-30 DIAGNOSIS — L988 Other specified disorders of the skin and subcutaneous tissue: Secondary | ICD-10-CM | POA: Diagnosis not present

## 2016-09-30 MED ORDER — DIAZEPAM 5 MG PO TABS: 5.0000 mg | ORAL_TABLET | Freq: Two times a day (BID) | ORAL | 0 refills | Status: DC | PRN

## 2016-09-30 NOTE — Progress Notes (Signed)
Radiation Oncology Follow up Note  Name: Dawn Allen   Date:   09/30/2016 MRN:  [id number] DOB: 10/30/1950    This 66 y.o. female presents to the clinic today for evaluation of skin reaction of her right breast as well as difficulty with sleeping.  REFERRING PROVIDER: Warnell Forester*  HPI: Patient is a 66 year old female well-known to our department having been treated with SB RT to her left upper lobe for stage I non-small cell carcinoma in 2017. She recent had a 5 cm mass in the upper outer quadrant the right breast. Biopsy was positive for triple negative breast cancer she had a wide local excision after neoadjuvant chemotherapy showing residual 5.2 cm invasive mammary carcinoma. She is seen today in her own request saying that the pain is still persistent her right breast. She also is having trouble sleeping.  COMPLICATIONS OF TREATMENT: none  FOLLOW UP COMPLIANCE: keeps appointments   PHYSICAL EXAM:  BP (!) 145/86   Pulse 89   Temp (!) 96.4 F (35.8 C)   Resp 20   Wt 170 lb 13.7 oz (77.5 kg)   BMI 28.43 kg/m  She does have some hyperpigmentation and dry disc commission the right breast swelling in the breast is secondary to her lumpectomy site and possible seroma. No other mass or nodularity is noted in either breast in 2 positions examined. Well-developed well-nourished patient in NAD. HEENT reveals PERLA, EOMI, discs not visualized.  Oral cavity is clear. No oral mucosal lesions are identified. Neck is clear without evidence of cervical or supraclavicular adenopathy. Lungs are clear to A&P. Cardiac examination is essentially unremarkable with regular rate and rhythm without murmur rub or thrill. Abdomen is benign with no organomegaly or masses noted. Motor sensory and DTR levels are equal and symmetric in the upper and lower extremities. Cranial nerves II through XII are grossly intact. Proprioception is intact. No peripheral adenopathy or edema is identified. No motor or  sensory levels are noted. Crude visual fields are within normal range.  RADIOLOGY RESULTS: No current films for review  PLAN: Present time I've assured the patient everything is normal. She is scheduled for follow-up PET CT scan I believe in the next 2 months ordered by medical oncology. I'm also refilling her diazepam for sleep. We'll see her back in 3 months for follow-up. She continues close follow-up care with medical oncology.  I would like to take this opportunity to thank you for allowing me to participate in the care of your patient.Armstead Peaks., MD

## 2016-10-13 ENCOUNTER — Ambulatory Visit
Admission: RE | Admit: 2016-10-13 | Discharge: 2016-10-13 | Disposition: A | Discharge status: Home / Self Care | Payer: Medicare Other | Source: Ambulatory Visit | Attending: Internal Medicine | Admitting: Internal Medicine

## 2016-10-13 DIAGNOSIS — Z171 Estrogen receptor negative status [ER-]: Secondary | ICD-10-CM | POA: Diagnosis not present

## 2016-10-13 DIAGNOSIS — C50811 Malignant neoplasm of overlapping sites of right female breast: Secondary | ICD-10-CM | POA: Insufficient documentation

## 2016-10-13 DIAGNOSIS — Z7901 Long term (current) use of anticoagulants: Secondary | ICD-10-CM | POA: Insufficient documentation

## 2016-10-13 DIAGNOSIS — K769 Liver disease, unspecified: Secondary | ICD-10-CM | POA: Insufficient documentation

## 2016-10-13 DIAGNOSIS — N6489 Other specified disorders of breast: Secondary | ICD-10-CM | POA: Diagnosis not present

## 2016-10-13 DIAGNOSIS — Z79899 Other long term (current) drug therapy: Secondary | ICD-10-CM | POA: Insufficient documentation

## 2016-10-13 DIAGNOSIS — R911 Solitary pulmonary nodule: Secondary | ICD-10-CM | POA: Insufficient documentation

## 2016-10-13 LAB — GLUCOSE, CAPILLARY: GLUCOSE-CAPILLARY: 160 mg/dL — AB (ref 65–99)

## 2016-10-13 MED ORDER — FLUDEOXYGLUCOSE F - 18 (FDG) INJECTION
12.0000 | Freq: Once | INTRAVENOUS | Status: AC | PRN
  Administered 2016-10-13: 12.84 via INTRAVENOUS

## 2016-10-20 ENCOUNTER — Encounter: Payer: Self-pay | Admitting: Internal Medicine

## 2016-10-20 ENCOUNTER — Inpatient Hospital Stay: Payer: Medicare Other

## 2016-10-20 ENCOUNTER — Ambulatory Visit: Payer: Medicare Other | Admitting: Radiation Oncology

## 2016-10-20 ENCOUNTER — Other Ambulatory Visit: Payer: Self-pay | Admitting: Internal Medicine

## 2016-10-20 ENCOUNTER — Inpatient Hospital Stay: Payer: Medicare Other | Attending: Internal Medicine | Admitting: Internal Medicine

## 2016-10-20 DIAGNOSIS — F17210 Nicotine dependence, cigarettes, uncomplicated: Secondary | ICD-10-CM

## 2016-10-20 DIAGNOSIS — C50811 Malignant neoplasm of overlapping sites of right female breast: Secondary | ICD-10-CM | POA: Diagnosis present

## 2016-10-20 DIAGNOSIS — Z90722 Acquired absence of ovaries, bilateral: Secondary | ICD-10-CM | POA: Insufficient documentation

## 2016-10-20 DIAGNOSIS — Z171 Estrogen receptor negative status [ER-]: Principal | ICD-10-CM

## 2016-10-20 DIAGNOSIS — Z7982 Long term (current) use of aspirin: Secondary | ICD-10-CM | POA: Insufficient documentation

## 2016-10-20 DIAGNOSIS — K5900 Constipation, unspecified: Secondary | ICD-10-CM | POA: Insufficient documentation

## 2016-10-20 DIAGNOSIS — Z9011 Acquired absence of right breast and nipple: Secondary | ICD-10-CM | POA: Insufficient documentation

## 2016-10-20 DIAGNOSIS — R918 Other nonspecific abnormal finding of lung field: Secondary | ICD-10-CM | POA: Diagnosis not present

## 2016-10-20 DIAGNOSIS — I2510 Atherosclerotic heart disease of native coronary artery without angina pectoris: Secondary | ICD-10-CM | POA: Insufficient documentation

## 2016-10-20 DIAGNOSIS — Z86718 Personal history of other venous thrombosis and embolism: Secondary | ICD-10-CM

## 2016-10-20 DIAGNOSIS — I252 Old myocardial infarction: Secondary | ICD-10-CM | POA: Diagnosis not present

## 2016-10-20 DIAGNOSIS — Z7901 Long term (current) use of anticoagulants: Secondary | ICD-10-CM | POA: Diagnosis not present

## 2016-10-20 DIAGNOSIS — G893 Neoplasm related pain (acute) (chronic): Secondary | ICD-10-CM | POA: Diagnosis not present

## 2016-10-20 DIAGNOSIS — Z923 Personal history of irradiation: Secondary | ICD-10-CM | POA: Diagnosis not present

## 2016-10-20 DIAGNOSIS — F419 Anxiety disorder, unspecified: Secondary | ICD-10-CM | POA: Diagnosis not present

## 2016-10-20 DIAGNOSIS — E119 Type 2 diabetes mellitus without complications: Secondary | ICD-10-CM | POA: Insufficient documentation

## 2016-10-20 DIAGNOSIS — Z85118 Personal history of other malignant neoplasm of bronchus and lung: Secondary | ICD-10-CM

## 2016-10-20 DIAGNOSIS — Z9221 Personal history of antineoplastic chemotherapy: Secondary | ICD-10-CM | POA: Insufficient documentation

## 2016-10-20 DIAGNOSIS — E041 Nontoxic single thyroid nodule: Secondary | ICD-10-CM | POA: Diagnosis not present

## 2016-10-20 DIAGNOSIS — G40909 Epilepsy, unspecified, not intractable, without status epilepticus: Secondary | ICD-10-CM | POA: Insufficient documentation

## 2016-10-20 DIAGNOSIS — I10 Essential (primary) hypertension: Secondary | ICD-10-CM

## 2016-10-20 DIAGNOSIS — E785 Hyperlipidemia, unspecified: Secondary | ICD-10-CM | POA: Diagnosis not present

## 2016-10-20 DIAGNOSIS — Z803 Family history of malignant neoplasm of breast: Secondary | ICD-10-CM | POA: Insufficient documentation

## 2016-10-20 DIAGNOSIS — K769 Liver disease, unspecified: Secondary | ICD-10-CM | POA: Diagnosis not present

## 2016-10-20 DIAGNOSIS — Z79899 Other long term (current) drug therapy: Secondary | ICD-10-CM | POA: Insufficient documentation

## 2016-10-20 LAB — CBC WITH DIFFERENTIAL/PLATELET
BASOS ABS: 0 10*3/uL (ref 0–0.1)
BASOS PCT: 1 %
Eosinophils Absolute: 0.2 10*3/uL (ref 0–0.7)
Eosinophils Relative: 3 %
HCT: 43 % (ref 35.0–47.0)
HEMOGLOBIN: 14.6 g/dL (ref 12.0–16.0)
LYMPHS PCT: 23 %
Lymphs Abs: 1.2 10*3/uL (ref 1.0–3.6)
MCH: 29.5 pg (ref 26.0–34.0)
MCHC: 34 g/dL (ref 32.0–36.0)
MCV: 86.8 fL (ref 80.0–100.0)
MONO ABS: 0.5 10*3/uL (ref 0.2–0.9)
Monocytes Relative: 9 %
NEUTROS ABS: 3.3 10*3/uL (ref 1.4–6.5)
NEUTROS PCT: 64 %
Platelets: 141 10*3/uL — ABNORMAL LOW (ref 150–440)
RBC: 4.95 MIL/uL (ref 3.80–5.20)
RDW: 17.9 % — ABNORMAL HIGH (ref 11.5–14.5)
WBC: 5.1 10*3/uL (ref 3.6–11.0)

## 2016-10-20 LAB — COMPREHENSIVE METABOLIC PANEL
ALBUMIN: 3.6 g/dL (ref 3.5–5.0)
ALK PHOS: 248 U/L — AB (ref 38–126)
ALT: 22 U/L (ref 14–54)
AST: 23 U/L (ref 15–41)
Anion gap: 6 (ref 5–15)
BILIRUBIN TOTAL: 0.3 mg/dL (ref 0.3–1.2)
BUN: 24 mg/dL — AB (ref 6–20)
CALCIUM: 9.2 mg/dL (ref 8.9–10.3)
CO2: 31 mmol/L (ref 22–32)
CREATININE: 0.93 mg/dL (ref 0.44–1.00)
Chloride: 99 mmol/L — ABNORMAL LOW (ref 101–111)
GFR calc Af Amer: >60 mL/min (ref >60)
GLUCOSE: 225 mg/dL — AB (ref 65–99)
Potassium: 4.3 mmol/L (ref 3.5–5.1)
Sodium: 136 mmol/L (ref 135–145)
TOTAL PROTEIN: 7.8 g/dL (ref 6.5–8.1)

## 2016-10-20 MED ORDER — HYDROCODONE-ACETAMINOPHEN 5-325 MG PO TABS: 1.0000 | ORAL_TABLET | Freq: Three times a day (TID) | ORAL | 0 refills | Status: DC | PRN

## 2016-10-20 NOTE — Progress Notes (Signed)
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   # Smoker; ? Paranoia; Feb 2017- MUGA scan- 67%     Carcinoma of overlapping sites of right breast in female, estrogen receptor negative ([facility]   INTERVAL HISTORY: Poor- vague historian  [id number] old female patient with above history of newly diagnosed breast cancer- triple negative; clinical stage II- currently on neo-adjuvant Chemotherapy followed by surgery currently undergoing radiation is here for follow up/ Review the results of her PET scan.  Patient complains of pain at the site of the lumpectomy. Shooting pains. She has been taking aspirin for pain control. Complains of constipation.   Otherwise patient denies any unusual shortness of breath or chest pain or cough.   REVIEW OF SYSTEMS: Difficult to assess given as patient is a poor historian.  PAST MEDICAL HISTORY :  Past Medical History:  Diagnosis Date  . Anxiety   . Breast cancer ([facility]2017   right breast, chemo  . Cancer of right female breast ([facility]  . Coronary artery disease    a. NSTEMI cath 08/02/14: LM nl, pLAD 50%, mLCx 30%, mRCA 95% s/p PCI/DES, 2nd lesion 40%, EF 55%  . Diabetes mellitus without complication ([facility]  . DVT (deep venous thrombosis) (H[facility])    LEFT LEG   . HLD (hyperlipidemia)   . Hypertension   . Lung cancer ([facility]  . Lung nodule   . MI (myocardial infarction)   .  Seizure disorder ([facility]  . Seizures ([facility]  . Squamous cell lung cancer ([facility]/2016    PAST SURGICAL HISTORY :   Past Surgical History:  Procedure Laterality Date  . ABDOMINAL HYSTERECTOMY     PARTIAL   . AXILLARY LYMPH NODE BIOPSY Right 05/26/2016   Procedure: AXILLARY LYMPH NODE BIOPSY;  Surgeon: CaHubbard RobinsonMD;  Location: [facility] ORS;  Service: General;  Laterality: Right;  . BREAST BIOPSY Right 09/15/2015   positve  . CARDIAC CATHETERIZATION  08/02/2014  . CORONARY ANGIOPLASTY  08/02/2014   drug eluting stent placement  . IVC FILTER PLACEMENT ([facility] HX)    . LEG SURGERY Right    BLOOD CLOT  . MASTECTOMY, PARTIAL Right 05/26/2016   Procedure: MASTECTOMY PARTIAL;  Surgeon: CaHubbard RobinsonMD;  Location: [facility] ORS;  Service: General;  Laterality: Right;  . PORTACATH PLACEMENT Left 10/13/2015   Procedure: INSERTION PORT-A-CATH;  Surgeon: CaHubbard RobinsonMD;  Location: [facility] ORS;  Service: General;  Laterality: Left;  . SENTINEL NODE BIOPSY Right 05/26/2016   Procedure: SENTINEL NODE BIOPSY;  Surgeon: CaHubbard RobinsonMD;  Location: [facility] ORS;  Service: General;  Laterality: Right;    FAMILY HISTORY :   Family History  Problem Relation Age of Onset  . Heart attack Mother   . Breast cancer Sister [id number]  bilaterally breast cancer    SOCIAL HISTORY:  Social History  Substance Use Topics  . Smoking status: Current Every Day Smoker    Packs/day: 1.00    Years: 45.00    Types: Cigarettes  . Smokeless tobacco: Never Used  . Alcohol use No    ALLERGIES:  is allergic to penicillins.  MEDICATIONS:  Current Outpatient Prescriptions  Medication Sig Dispense Refill  . apixaban (ELIQUIS) 2.5 MG TABS tablet Take 1 tablet (2.5 mg total) by mouth 2 (two) times daily. Reported on 01/01/2016 60 tablet 0  . Aspirin-Salicylamide-Caffeine (BC HEADACHE POWDER PO) Take 1 packet by mouth daily.    Marland Kitchen atorvastatin (LIPITOR) 40 MG tablet Take 1 tablet by mouth daily.    . diazepam  (VALIUM) 5 MG tablet Take 1 tablet (5 mg total) by mouth 2 (two) times daily as needed. Reported on 12/16/2015 60 tablet 0  . empagliflozin (JARDIANCE) 25 MG TABS tablet Take 25 mg by mouth daily.    Marland Kitchen lidocaine-prilocaine (EMLA) cream Apply 1 application topically as needed. Apply to port a cath site 1 hour before chemotherapy treatment. 30 g 6  . lisinopril-hydrochlorothiazide (PRINZIDE,ZESTORETIC) 20-25 MG per tablet Take 1 tablet by mouth daily.     . phenytoin (DILANTIN) 100 MG ER capsule Take 300 mg by mouth daily.     . pioglitazone (ACTOS) 45 MG tablet Take 1 tablet by mouth daily.    Marland Kitchen HYDROcodone-acetaminophen (NORCO/VICODIN) 5-325 MG tablet Take 1 tablet by mouth every 8 (eight) hours as needed for moderate pain. 40 tablet 0  . hydrocortisone cream 1 % Apply to affected area 2 times daily (Patient not taking: Reported on 10/20/2016) 56 g 0  . polyethylene glycol powder (MIRALAX) powder Take 255 g by mouth once. Take as directed 255 g 0  . VENTOLIN HFA 108 (90 Base) MCG/ACT inhaler Inhale 1 puff into the lungs every 6 (six) hours as needed. Reported on 01/20/2016     No current facility-administered medications for this visit.    Facility-Administered Medications Ordered in Other Visits  Medication Dose Route Frequency Provider Last Rate Last Dose  . albuterol (PROVENTIL) (2.5 MG/3ML) 0.083% nebulizer solution 2.5 mg  2.5 mg Nebulization Once Nestor Lewandowsky, MD      . sodium chloride flush (NS) 0.9 % injection 10 mL  10 mL Intravenous PRN Cammie Sickle, MD   10 mL at 12/01/15 0850    PHYSICAL EXAMINATION: ECOG PERFORMANCE STATUS: 0 - Asymptomatic  BP 119/79 (BP Location: Left Arm, Patient Position: Sitting)   Pulse 81   Resp 18   Ht '5\' 5"'$  (1.651 m)   There were no vitals filed for this visit.  GENERAL: Well-nourished well-developed; Alert, no distress and comfortable. Alone;  EYES: no pallor or icterus OROPHARYNX: no thrush or ulceration; right lower jaw molar-  tenderness; no pus noted.  NECK: supple, no masses felt LYMPH: no palpable lymphadenopathy in the cervical, axillary or inguinal regions LUNGS: clear to auscultation and No wheeze or crackles HEART/CVS: regular rate & rhythm and no murmurs; No lower extremity edema ABDOMEN:abdomen soft, non-tender and normal bowel sounds Musculoskeletal:no cyanosis of digits and no clubbing  PSYCH: alert & oriented x 3 with fluent speech NEURO: no focal motor/sensory deficits Skin- radiation changes noted in the area of the breast and neck.   LABORATORY DATA:  I have reviewed the data as listed    Component Value Date/Time   NA 136 10/20/2016 1502   NA 141 08/03/2014 0050   K 4.3 10/20/2016 1502   K 3.3 (L) 08/03/2014  0050   CL 99 (L) 10/20/2016 1502   CL 108 (H) 08/03/2014 0050   CO2 31 10/20/2016 1502   CO2 27 08/03/2014 0050   GLUCOSE 225 (H) 10/20/2016 1502   GLUCOSE 164 (H) 08/03/2014 0050   BUN 24 (H) 10/20/2016 1502   BUN 14 08/03/2014 0050   CREATININE 0.93 10/20/2016 1502   CREATININE 0.68 08/03/2014 0050   CALCIUM 9.2 10/20/2016 1502   CALCIUM 8.2 (L) 08/03/2014 0050   PROT 7.8 10/20/2016 1502   PROT 6.7 04/11/2013 2241   ALBUMIN 3.6 10/20/2016 1502   ALBUMIN 2.7 (L) 04/11/2013 2241   AST 23 10/20/2016 1502   AST 13 (L) 04/11/2013 2241   ALT 22 10/20/2016 1502   ALT 16 04/11/2013 2241   ALKPHOS 248 (H) 10/20/2016 1502   ALKPHOS 207 (H) 04/11/2013 2241   BILITOT 0.3 10/20/2016 1502   BILITOT 0.1 (L) 04/11/2013 2241   GFRNONAA >60 10/20/2016 1502   GFRNONAA >60 08/03/2014 0050   GFRNONAA >60 04/11/2013 2241   GFRAA >60 10/20/2016 1502   GFRAA >60 08/03/2014 0050   GFRAA >60 04/11/2013 2241    No results found for: SPEP, UPEP  Lab Results  Component Value Date   WBC 5.1 10/20/2016   NEUTROABS 3.3 10/20/2016   HGB 14.6 10/20/2016   HCT 43.0 10/20/2016   MCV 86.8 10/20/2016   PLT 141 (L) 10/20/2016      Chemistry      Component Value Date/Time   NA 136  10/20/2016 1502   NA 141 08/03/2014 0050   K 4.3 10/20/2016 1502   K 3.3 (L) 08/03/2014 0050   CL 99 (L) 10/20/2016 1502   CL 108 (H) 08/03/2014 0050   CO2 31 10/20/2016 1502   CO2 27 08/03/2014 0050   BUN 24 (H) 10/20/2016 1502   BUN 14 08/03/2014 0050   CREATININE 0.93 10/20/2016 1502   CREATININE 0.68 08/03/2014 0050      Component Value Date/Time   CALCIUM 9.2 10/20/2016 1502   CALCIUM 8.2 (L) 08/03/2014 0050   ALKPHOS 248 (H) 10/20/2016 1502   ALKPHOS 207 (H) 04/11/2013 2241   AST 23 10/20/2016 1502   AST 13 (L) 04/11/2013 2241   ALT 22 10/20/2016 1502   ALT 16 04/11/2013 2241   BILITOT 0.3 10/20/2016 1502   BILITOT 0.1 (L) 04/11/2013 2241        ASSESSMENT & PLAN:   Carcinoma of overlapping sites of right breast in female, estrogen receptor negative ([place]) # Breast Ca- ER/PR- Neg; Her2-NEG. T3N0- STAGE II; triple negative- s/p neo-adjuvant chemotherapy; s/p Lumpectomy &  pTNM: ypT3 ypN0 (sn). S/p  postlumpectomy radiation.  # Unfortunately February 26th  PET scan shows- uptake in the liver; left lower lobe approximately 1 cm. Highly concerning for metastatic malignancy in the liver. We will review images at the tumor conference. Patient as expected was very upset with the results of the PET scan.  # Chronic pain/and pain from breast surgery- continue hydrocodone1 every 8 hours as needed. New prescription given.  # Thyroid nodule- s/p Bx- positive for follicular cancer; however given new lesion in liver; await further work up for now.   # DVT bil- on Elquis;   # History of stage I lung cancer status post SBR T.  # port flush every 8 weeks; will need port flush at next visit.  # follow up to be decided based on recommendations from the tumor conference.    # I reviewed the blood work- with  the patient in detail; also reviewed the imaging independently [as summarized above]; and with the patient in detail.       Cammie Sickle, MD 10/20/2016 4:23 PM

## 2016-10-20 NOTE — Assessment & Plan Note (Addendum)
#  Breast Ca- ER/PR- Neg; Her2-NEG. T3N0- STAGE II; triple negative- s/p neo-adjuvant chemotherapy; s/p Lumpectomy &  pTNM: ypT3 ypN0 (sn). S/p  postlumpectomy radiation.  # Unfortunately February 26th  PET scan shows- uptake in the liver; left lower lobe approximately 1 cm. Highly concerning for metastatic malignancy in the liver. We will review images at the tumor conference. Patient as expected was very upset with the results of the PET scan.  # Chronic pain/and pain from breast surgery- continue hydrocodone1 every 8 hours as needed. New prescription given.  # Thyroid nodule- s/p Bx- positive for follicular cancer; however given new lesion in liver; await further work up for now.   # DVT bil- on Elquis;   # History of stage I lung cancer status post SBR T.  # port flush every 8 weeks; will need port flush at next visit.  # follow up to be decided based on recommendations from the tumor conference.    # I reviewed the blood work- with the patient in detail; also reviewed the imaging independently [as summarized above]; and with the patient in detail.

## 2016-10-21 ENCOUNTER — Telehealth: Payer: Self-pay | Admitting: Internal Medicine

## 2016-10-21 ENCOUNTER — Other Ambulatory Visit: Payer: Self-pay | Admitting: Internal Medicine

## 2016-10-21 DIAGNOSIS — R160 Hepatomegaly, not elsewhere classified: Secondary | ICD-10-CM

## 2016-10-21 NOTE — Telephone Encounter (Signed)
Discussed at Tumor conference; reocmmend [place] Bx- Shawn would inform pt; please set up biopsy; and follow up few days later. Thx

## 2016-10-21 NOTE — Telephone Encounter (Signed)
Scheduling worksheet completed and given to cancer ctr sch. Team. Raquel Sarna or Nira Conn will contact patient with details.

## 2016-10-26 ENCOUNTER — Telehealth: Payer: Self-pay | Admitting: Cardiovascular Disease

## 2016-10-26 ENCOUNTER — Emergency Department
Admission: EM | Admit: 2016-10-26 | Discharge: 2016-10-26 | Disposition: A | Discharge status: Home / Self Care | Payer: Medicare Other | Attending: Emergency Medicine | Admitting: Emergency Medicine

## 2016-10-26 ENCOUNTER — Telehealth: Payer: Self-pay | Admitting: *Deleted

## 2016-10-26 ENCOUNTER — Emergency Department: Payer: Medicare Other

## 2016-10-26 ENCOUNTER — Encounter: Payer: Self-pay | Admitting: *Deleted

## 2016-10-26 DIAGNOSIS — R197 Diarrhea, unspecified: Secondary | ICD-10-CM

## 2016-10-26 DIAGNOSIS — T472X5A Adverse effect of stimulant laxatives, initial encounter: Secondary | ICD-10-CM | POA: Insufficient documentation

## 2016-10-26 DIAGNOSIS — Z85118 Personal history of other malignant neoplasm of bronchus and lung: Secondary | ICD-10-CM | POA: Insufficient documentation

## 2016-10-26 DIAGNOSIS — I252 Old myocardial infarction: Secondary | ICD-10-CM | POA: Diagnosis not present

## 2016-10-26 DIAGNOSIS — Z79899 Other long term (current) drug therapy: Secondary | ICD-10-CM | POA: Insufficient documentation

## 2016-10-26 DIAGNOSIS — I2510 Atherosclerotic heart disease of native coronary artery without angina pectoris: Secondary | ICD-10-CM | POA: Insufficient documentation

## 2016-10-26 DIAGNOSIS — F17210 Nicotine dependence, cigarettes, uncomplicated: Secondary | ICD-10-CM | POA: Insufficient documentation

## 2016-10-26 DIAGNOSIS — I10 Essential (primary) hypertension: Secondary | ICD-10-CM | POA: Diagnosis not present

## 2016-10-26 DIAGNOSIS — E119 Type 2 diabetes mellitus without complications: Secondary | ICD-10-CM | POA: Diagnosis not present

## 2016-10-26 DIAGNOSIS — Z853 Personal history of malignant neoplasm of breast: Secondary | ICD-10-CM | POA: Diagnosis not present

## 2016-10-26 DIAGNOSIS — R1084 Generalized abdominal pain: Secondary | ICD-10-CM

## 2016-10-26 LAB — COMPREHENSIVE METABOLIC PANEL
ALBUMIN: 3.6 g/dL (ref 3.5–5.0)
ALK PHOS: 220 U/L — AB (ref 38–126)
ALT: 16 U/L (ref 14–54)
ANION GAP: 9 (ref 5–15)
AST: 19 U/L (ref 15–41)
BUN: 24 mg/dL — ABNORMAL HIGH (ref 6–20)
CALCIUM: 9 mg/dL (ref 8.9–10.3)
CO2: 28 mmol/L (ref 22–32)
Chloride: 100 mmol/L — ABNORMAL LOW (ref 101–111)
Creatinine, Ser: 0.86 mg/dL (ref 0.44–1.00)
GFR calc non Af Amer: >60 mL/min (ref >60)
GLUCOSE: 166 mg/dL — AB (ref 65–99)
Potassium: 3.6 mmol/L (ref 3.5–5.1)
SODIUM: 137 mmol/L (ref 135–145)
Total Bilirubin: 0.4 mg/dL (ref 0.3–1.2)
Total Protein: 7.4 g/dL (ref 6.5–8.1)

## 2016-10-26 LAB — URINALYSIS, COMPLETE (UACMP) WITH MICROSCOPIC
BACTERIA UA: NONE SEEN
Bilirubin Urine: NEGATIVE
KETONES UR: NEGATIVE mg/dL
Leukocytes, UA: NEGATIVE
Nitrite: NEGATIVE
PROTEIN: NEGATIVE mg/dL
Specific Gravity, Urine: 1.018 (ref 1.005–1.030)
pH: 5 (ref 5.0–8.0)

## 2016-10-26 LAB — LIPASE, BLOOD: Lipase: <10 U/L — ABNORMAL LOW (ref 11–51)

## 2016-10-26 LAB — CBC
HEMATOCRIT: 44.4 % (ref 35.0–47.0)
HEMOGLOBIN: 14.9 g/dL (ref 12.0–16.0)
MCH: 29.5 pg (ref 26.0–34.0)
MCHC: 33.5 g/dL (ref 32.0–36.0)
MCV: 87.9 fL (ref 80.0–100.0)
Platelets: 136 10*3/uL — ABNORMAL LOW (ref 150–440)
RBC: 5.05 MIL/uL (ref 3.80–5.20)
RDW: 17.2 % — ABNORMAL HIGH (ref 11.5–14.5)
WBC: 7.2 10*3/uL (ref 3.6–11.0)

## 2016-10-26 MED ORDER — IOPAMIDOL (ISOVUE-300) INJECTION 61%
30.0000 mL | Freq: Once | INTRAVENOUS | Status: AC | PRN
  Administered 2016-10-26: 30 mL via ORAL

## 2016-10-26 MED ORDER — ONDANSETRON 4 MG PO TBDP: 4.0000 mg | ORAL_TABLET | Freq: Three times a day (TID) | ORAL | 0 refills | Status: DC | PRN

## 2016-10-26 MED ORDER — CIPROFLOXACIN HCL 500 MG PO TABS
500.0000 mg | ORAL_TABLET | Freq: Once | ORAL | Status: AC
  Administered 2016-10-26: 500 mg via ORAL
  Filled 2016-10-26: qty 1

## 2016-10-26 MED ORDER — METRONIDAZOLE 500 MG PO TABS
500.0000 mg | ORAL_TABLET | Freq: Once | ORAL | Status: AC
Start: 2016-10-26 — End: 2016-10-26
  Administered 2016-10-26: 500 mg via ORAL
  Filled 2016-10-26: qty 1

## 2016-10-26 MED ORDER — IOPAMIDOL (ISOVUE-300) INJECTION 61%
100.0000 mL | Freq: Once | INTRAVENOUS | Status: AC | PRN
  Administered 2016-10-26: 100 mL via INTRAVENOUS

## 2016-10-26 MED ORDER — ONDANSETRON HCL 4 MG/2ML IJ SOLN
4.0000 mg | Freq: Once | INTRAMUSCULAR | Status: AC
Start: 2016-10-26 — End: 2016-10-26
  Administered 2016-10-26: 4 mg via INTRAVENOUS
  Filled 2016-10-26: qty 2

## 2016-10-26 MED ORDER — CIPROFLOXACIN HCL 500 MG PO TABS: 500.0000 mg | ORAL_TABLET | Freq: Two times a day (BID) | ORAL | 0 refills | Status: DC

## 2016-10-26 MED ORDER — METRONIDAZOLE 500 MG PO TABS: 500.0000 mg | ORAL_TABLET | Freq: Three times a day (TID) | ORAL | 0 refills | Status: DC

## 2016-10-26 NOTE — ED Notes (Signed)
Iv started  meds given.   

## 2016-10-26 NOTE — Telephone Encounter (Signed)
S/w Ebony Hail in [place] who requests advice on stopping eliquis prior to procedure. Pt last seen in 2015 by Christell Faith, PA-C. At that time, pt was taking plavix. Eliquis prescribed/refilled by Susa Griffins, MD. Dr. Fletcher Anon is unable to advise.

## 2016-10-26 NOTE — Telephone Encounter (Signed)
Request for surgical clearance:  1. What type of surgery is being performed? Liver leasion biopsy  2. When is this surgery scheduled? 11/02/16  3. Are there any medications that need to be held prior to surgery and how long? Eloquis   4. Name of physician performing surgery? Dr. Kathlene Cote  5. What is your office phone and fax number? Fax [phone]. [phone]

## 2016-10-26 NOTE — Telephone Encounter (Signed)
Patient contacted RN today to discuss her multiple concerns about the upcoming liver biopsy. Patient relates that she is "very anxious regarding the liver biopsy and is considering not going through the procedure." pt states, "Desman Polak, I feel like I did all my treatments for nothing and my cancer still came back. Life doesn't seem fair. All that pain and all that treatment is all in vain. I told the nurse that called me about the biopsy that I don't want to experience any pain with the liver biopsy. I just don't want to go through this again. I just don't want to go through that. I just don't wanna go. Just tell me, am I gonna die is my prognosis that bad? Why did Dr. B say that he presented my case to a whole bunch of doctors. I feel like that in itself had increased my anxiety. I don't know what to do. I don't know why this liver biopsy is needed and why do I have to stay 4 to 5 hrs after the biopsy."    Reassurance and active listening provided to patient.  Explained the goal of care for patient. I explained that the liver biopsy is needed so that [name] could provid the patient with further treatment recommends.  She asked if [name] could confirm if this is metastatic breast cancer with the liver biopsy. I explained that the goal of care is to determine the pathology on the liver biopsy. Pt educated that this will give the doctor.more information to know how to best treat her and provide the best recommendations. I explained to patient that ultimately- it is her choice whether to proceed or not to proceed with the liver biopsy. Explained to patient that she will be asked to take up to 4 to 5 hrs after the liver biopsy to ensure that she doesn't experience any complications after the procedure. I educated the patient that the liver is a very vascular organ and that given her history of anticoagulants, she remains at a higher risk for bleeding complications after the liver biopsy. She gave verbal  understanding and stated, "thank you for helping me. I will go to the appointment. Thank you for the reassurance."

## 2016-10-26 NOTE — OR Nursing (Signed)
Called left message with Dr Fletcher Anon office regarding request for clearance to stop Eliquis 48 hours pre liver bx which is scheduled for 11/02/16,

## 2016-10-26 NOTE — ED Notes (Signed)
Pt reports cramping pain in abdomen.  Pt taking otc meds without relief.  no v/d.  abd soft. No chest pain or sob.  Pt alert.

## 2016-10-26 NOTE — Discharge Instructions (Signed)
Your CT scan shows mild inflammation of your large intestine.  Take antibiotics as prescribed and follow up with your doctor tomorrow.

## 2016-10-26 NOTE — ED Provider Notes (Signed)
[facility] Emergency Department Provider Note  ____________________________________________  Time seen: Approximately 9:16 PM  I have reviewed the triage vital signs and the nursing notes.   HISTORY  Chief Complaint Abdominal Pain    HPI Dawn Allen is a 66 y.o. female who complains of generalized abdominal pain, cramping all day. Constant. No aggravating or alleviating factors. No radiation. Nauseated but no vomiting. Was constipated a few days ago and took laxatives and is now having loose bowel movements for the last few days but no bowel movements today.  No fever or chills or sweats.     Past Medical History:  Diagnosis Date  . Anxiety   . Breast cancer ([place]) 08/2015   right breast, chemo  . Cancer of right female breast ([place])   . Coronary artery disease    a. NSTEMI cath 08/02/14: LM nl, pLAD 50%, mLCx 30%, mRCA 95% s/p PCI/DES, 2nd lesion 40%, EF 55%  . Diabetes mellitus without complication ([place])   . DVT (deep venous thrombosis) ([facility])    LEFT LEG   . HLD (hyperlipidemia)   . Hypertension   . Lung cancer ([place])   . Lung nodule   . MI (myocardial infarction)   . Seizure disorder ([place])   . Seizures ([place])   . Squamous cell lung cancer ([place]) 04/23/2015     Patient Active Problem List   Diagnosis Date Noted  . Liver mass 10/21/2016  . Carcinoma of overlapping sites of right breast in female, estrogen receptor negative ([place]) 02/17/2016  . Mass of right breast 08/14/2015  . Thyroid nodule 04/29/2015  . Squamous cell lung cancer ([place]) 04/23/2015  . Hypokalemia 03/02/2015  . Leukocytosis 03/02/2015  . Generalized weakness 03/02/2015  . Lung nodule 03/02/2015  . Pain in a tooth or teeth 03/02/2015  . Sepsis ([place]) 03/02/2015  . Lung mass 03/02/2015  . SIRS (systemic inflammatory response syndrome) ([place]) 02/27/2015  . Coronary artery disease   . Hypertension   . DVT (deep venous thrombosis) ([place])   . HLD (hyperlipidemia)   .  Achilles bursitis or tendinitis 05/31/2013  . 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 Other hammer toe (acquired) 05/31/2013  . 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 ABDOMINAL HYSTERECTOMY     PARTIAL   . AXILLARY LYMPH NODE BIOPSY Right 05/26/2016   Procedure: AXILLARY LYMPH NODE BIOPSY;  Surgeon: Hubbard Robinson, MD;  Location: [facility] ORS;  Service: General;  Laterality: Right;  . BREAST BIOPSY Right 09/15/2015   positve  . CARDIAC CATHETERIZATION  08/02/2014  . CORONARY ANGIOPLASTY  08/02/2014   drug eluting stent placement  . IVC FILTER PLACEMENT ([facility] HX)    . LEG SURGERY Right    BLOOD CLOT  . MASTECTOMY, PARTIAL Right 05/26/2016   Procedure: MASTECTOMY PARTIAL;  Surgeon: Hubbard Robinson, MD;  Location: [facility] ORS;  Service: General;  Laterality: Right;  . PORTACATH PLACEMENT Left 10/13/2015   Procedure: INSERTION PORT-A-CATH;  Surgeon: Hubbard Robinson, MD;  Location: [facility] ORS;  Service: General;  Laterality: Left;  . SENTINEL NODE BIOPSY Right 05/26/2016   Procedure: SENTINEL NODE BIOPSY;  Surgeon: Hubbard Robinson, MD;  Location: [facility] ORS;  Service: General;  Laterality: Right;     Prior to Admission medications   Medication Sig Start Date End Date Taking? Authorizing Provider  apixaban (ELIQUIS) 2.5 MG TABS tablet Take 1 tablet (2.5 mg total) by mouth 2 (two) times daily. Reported  on 01/01/2016 05/29/16   Hubbard Robinson, MD  Aspirin-Salicylamide-Caffeine (BC HEADACHE POWDER PO) Take 1 packet by mouth daily.    Historical Provider, MD  atorvastatin (LIPITOR) 40 MG tablet Take 1 tablet by mouth daily. 05/24/16   Historical Provider, MD  ciprofloxacin (CIPRO) 500 MG tablet Take 1 tablet (500 mg total) by mouth 2 (two) times daily. 10/26/16   Carrie Mew, MD  diazepam (VALIUM) 5 MG tablet Take 1 tablet (5 mg total) by mouth 2 (two) times daily as needed. Reported on 12/16/2015 09/30/16    Noreene Filbert, MD  empagliflozin (JARDIANCE) 25 MG TABS tablet Take 25 mg by mouth daily.    Historical Provider, MD  HYDROcodone-acetaminophen (NORCO/VICODIN) 5-325 MG tablet Take 1 tablet by mouth every 8 (eight) hours as needed for moderate pain. 10/20/16   Cammie Sickle, MD  hydrocortisone cream 1 % Apply to affected area 2 times daily Patient not taking: Reported on 10/20/2016 07/09/16 07/09/17  Hubbard Robinson, MD  lidocaine-prilocaine (EMLA) cream Apply 1 application topically as needed. Apply to port a cath site 1 hour before chemotherapy treatment. 10/06/15   Cammie Sickle, MD  lisinopril-hydrochlorothiazide (PRINZIDE,ZESTORETIC) 20-25 MG per tablet Take 1 tablet by mouth daily.  07/26/14   Historical Provider, MD  metroNIDAZOLE (FLAGYL) 500 MG tablet Take 1 tablet (500 mg total) by mouth 3 (three) times daily. 10/26/16   Carrie Mew, MD  ondansetron (ZOFRAN ODT) 4 MG disintegrating tablet Take 1 tablet (4 mg total) by mouth every 8 (eight) hours as needed for nausea or vomiting. 10/26/16   Carrie Mew, MD  phenytoin (DILANTIN) 100 MG ER capsule Take 300 mg by mouth daily.     Historical Provider, MD  pioglitazone (ACTOS) 45 MG tablet Take 1 tablet by mouth daily. 01/28/15   Historical Provider, MD  polyethylene glycol powder (MIRALAX) powder Take 255 g by mouth once. Take as directed 06/08/16 06/08/16  Cammie Sickle, MD  VENTOLIN HFA 108 (90 Base) MCG/ACT inhaler Inhale 1 puff into the lungs every 6 (six) hours as needed. Reported on 01/20/2016 01/09/16   Historical Provider, MD     Allergies Penicillins   Family History  Problem Relation Age of Onset  . Heart attack Mother   . Breast cancer Sister 21    bilaterally breast cancer    Social History Social History  Substance Use Topics  . Smoking status: Current Every Day Smoker    Packs/day: 1.00    Years: 45.00    Types: Cigarettes  . Smokeless tobacco: Never Used  . Alcohol use No    Review of  Systems  Constitutional:   No fever or chills.  ENT:   No sore throat. No rhinorrhea. Cardiovascular:   No chest pain. Respiratory:   No dyspnea or cough. Gastrointestinal:   Positive generalized abdominal pain. No vomiting. Loose bowel movements yesterday..  Genitourinary:   Negative for dysuria or difficulty urinating. Musculoskeletal:   Negative for focal pain or swelling Neurological:   Negative for headaches 10-point ROS otherwise negative.  ____________________________________________   PHYSICAL EXAM:  VITAL SIGNS: ED Triage Vitals  Enc Vitals Group     BP 10/26/16 1935 121/90     Pulse Rate 10/26/16 1935 89     Resp 10/26/16 1935 18     Temp 10/26/16 1935 98.2 F (36.8 C)     Temp Source 10/26/16 1935 Oral     SpO2 10/26/16 1935 100 %     Weight 10/26/16 1935 160 lb (  72.6 kg)     Height 10/26/16 1935 '5\' 5"'$  (1.651 m)     Head Circumference --      Peak Flow --      Pain Score 10/26/16 1936 9     Pain Loc --      Pain Edu? --      Excl. in [place]? --     Vital signs reviewed, nursing assessments reviewed.   Constitutional:   Alert and oriented. Well appearing and in no distress. Eyes:   No scleral icterus. No conjunctival pallor. PERRL. EOMI.  No nystagmus. ENT   Head:   Normocephalic and atraumatic.   Nose:   No congestion/rhinnorhea. No septal hematoma   Mouth/Throat:   MMM, no pharyngeal erythema. No peritonsillar mass.    Neck:   No stridor. No SubQ emphysema. No meningismus. Hematological/Lymphatic/Immunilogical:   No cervical lymphadenopathy. Cardiovascular:   RRR. Symmetric bilateral radial and DP pulses.  No murmurs.  Respiratory:   Normal respiratory effort without tachypnea nor retractions. Breath sounds are clear and equal bilaterally. No wheezes/rales/rhonchi. Gastrointestinal:   Soft with right lower quadrant tenderness. Non distended. There is no CVA tenderness.  No rebound, rigidity, or guarding. Genitourinary:    deferred Musculoskeletal:   Normal range of motion in all extremities. No joint effusions.  No lower extremity tenderness.  No edema. Neurologic:   Normal speech and language.  CN 2-10 normal. Motor grossly intact. No gross focal neurologic deficits are appreciated.  Skin:    Skin is warm, dry and intact. No rash noted.  No petechiae, purpura, or bullae.  ____________________________________________    LABS (pertinent positives/negatives) (all labs ordered are listed, but only abnormal results are displayed) Labs Reviewed  LIPASE, BLOOD - Abnormal; Notable for the following:       Result Value   Lipase <10 (*)    All other components within normal limits  COMPREHENSIVE METABOLIC PANEL - Abnormal; Notable for the following:    Chloride 100 (*)    Glucose, Bld 166 (*)    BUN 24 (*)    Alkaline Phosphatase 220 (*)    All other components within normal limits  CBC - Abnormal; Notable for the following:    RDW 17.2 (*)    Platelets 136 (*)    All other components within normal limits  URINALYSIS, COMPLETE (UACMP) WITH MICROSCOPIC - Abnormal; Notable for the following:    Color, Urine YELLOW (*)    APPearance CLEAR (*)    Glucose, UA >=500 (*)    Hgb urine dipstick MODERATE (*)    Squamous Epithelial / LPF 0-5 (*)    All other components within normal limits   ____________________________________________   EKG    ____________________________________________    RADIOLOGY  Ct Abdomen Pelvis W Contrast  Result Date: 10/26/2016 CLINICAL DATA:  Abdominal cramping beginning today, after taking MiraLax and 3 other laxatives yesterday for constipation. Now with diarrhea. History of breast cancer, hysterectomy, diabetes, lung cancer. EXAM: CT ABDOMEN AND PELVIS WITH CONTRAST TECHNIQUE: Multidetector CT imaging of the abdomen and pelvis was performed using the standard protocol following bolus administration of intravenous contrast. CONTRAST:  114m ISOVUE-300 IOPAMIDOL (ISOVUE-300)  INJECTION 61% COMPARISON:  PET- CT October 13, 2016 and CT abdomen and pelvis February 06, 2015 FINDINGS: LOWER CHEST: Lung bases are clear. Included heart size is normal. Partially imaged coronary artery calcifications. No pericardial effusion. No pericardial effusion. HEPATOBILIARY: 2.9 cm ill-defined central hepatic hypodense similar to prior CT. Punctate hepatic granuloma LEFT lobe. Bilateral  adrenal masses unchanged. PANCREAS: Normal. SPLEEN: Normal. ADRENALS/URINARY TRACT: Kidneys are orthotopic, demonstrating symmetric enhancement. No nephrolithiasis, hydronephrosis or solid renal masses. 2.4 cm RIGHT interpolar cyst, 11 mm RIGHT lower pole cyst. The unopacified ureters are normal in course and caliber. Delayed imaging through the kidneys demonstrates symmetric prompt contrast excretion within the proximal urinary collecting system. Urinary bladder is decompressed and unremarkable. Normal adrenal glands. STOMACH/BOWEL: Large cecal diverticulum again noted without discrete appendix. New cecal wall thickening and edema with pericecal inflammation. Inflammation involves the base of the giant diverticulum. Small hiatal hernia. The stomach, small bowel are normal in course and caliber without inflammatory changes. Mild retained large bowel stool. Enteric contrast has not yet reached the distal small bowel. Moderate descending and sigmoid colonic diverticulosis. VASCULAR/LYMPHATIC: Aortoiliac vessels are normal in course and caliber, moderate calcific atherosclerosis. Inferior vena cava filter at the level of L2-3. No lymphadenopathy by CT size criteria. REPRODUCTIVE: Status post hysterectomy. OTHER: Small amount of free fluid in the pelvis without drainable fluid collection or intraperitoneal free air. MUSCULOSKELETAL: Nonacute. Stable coarse calcifications in the breast bilaterally. Moderate bilateral hip osteoarthrosis. Broad levoscoliosis. IMPRESSION: Cecal inflammation including cecal diverticulum, this could  represent diverticulitis or typhlitis given patient's history of breast cancer and chemotherapy. Small amount of free fluid without abscess. 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 Bilateral adrenal masses, hypo metabolic on prior PET-CT. Electronically Signed   By: Elon Alas M.D.   On: 10/26/2016 22:34    ____________________________________________   PROCEDURES Procedures  ____________________________________________   INITIAL IMPRESSION / ASSESSMENT AND PLAN / ED COURSE  Pertinent labs & imaging results that were available during my care of the patient were reviewed by me and considered in my medical decision making (see chart for details).  Patient presents with generalized abdominal pain. She initially represents diffuse tenderness, but with distraction most of the abdomen is soft and nontender but there is persistent right lower quadrant tenderness. We'll get a CT scan of the abdomen pelvis to evaluate for appendicitis. Vital signs are unremarkable. Afebrile. We'll give Zofran for nausea relief. Low suspicion for perforation obstruction AAA dissection or mesenteric ischemia.     ----------------------------------------- 11:17 PM on 10/26/2016 -----------------------------------------  CT negative for appendicitis. Shows some mild inflammation of the cecum. Patient is afebrile, normal vital signs, normal white blood cell count, relatively benign exam. I do not think her presentation is consistent with typhlitis. I'll treat her with Cipro and Flagyl for a mild underlying colitis which is likely laxative-related, follow-up with primary care and medical oncology this week.      ____________________________________________   FINAL CLINICAL IMPRESSION(S) / ED DIAGNOSES  Final diagnoses:  Generalized abdominal pain  Diarrhea, unspecified type  Adverse effect of stimulant laxative, initial encounter      New  Prescriptions   CIPROFLOXACIN (CIPRO) 500 MG TABLET    Take 1 tablet (500 mg total) by mouth 2 (two) times daily.   METRONIDAZOLE (FLAGYL) 500 MG TABLET    Take 1 tablet (500 mg total) by mouth 3 (three) times daily.   ONDANSETRON (ZOFRAN ODT) 4 MG DISINTEGRATING TABLET    Take 1 tablet (4 mg total) by mouth every 8 (eight) hours as needed for nausea or vomiting.     Portions of this note were generated with dragon dictation software. Dictation errors may occur despite best attempts at proofreading.    Carrie Mew, MD 10/26/16 [phone]

## 2016-10-26 NOTE — ED Triage Notes (Signed)
Pt reports to ED w/ c/o abd cramping that started today.  Reports +PO intake. Pt sts that earlier this week she had constipation, yesterday she took miralax and 3 other laxatives. Pt sts that she had normal BM and then developed diarrhea. Denies diarrhea today.  Pt alert and oriented, able to speak in full sentences w/o issue. NAD.

## 2016-10-29 ENCOUNTER — Telehealth: Payer: Self-pay | Admitting: *Deleted

## 2016-10-29 NOTE — Telephone Encounter (Signed)
Patient contacted RN regarding recent visit in ED. Pt states that she was diagnosed with colitis on Tuesday evening. Patient had a CT scan of abd and pelvis to work up her abdominal pain. She has not been able to eat or drink much since her event in ED. She is only eating bites of saltine crackers. md in ED prescribed cipro and flaygl. Pt reports that since starting these tablets, she has a decreased perception of taste. "nothing taste right. I am staying very queezy and nauseated. I have dry heaves. I am taking sublingual zofran and it is not helping." pt reports last zofran dosing was taken at 930 am this morning. Nausea has not been relieved. Pt still has oral compazine at home which she used previously for chemotherapy. Pt asked if she "may I also use the compazine if the zofran does not relive my symptoms."  Pt instructed to take the compazine to see if this tablet provides relief. Encouraged pt to increase her po fluid intake, drink/sip on ginger ale. Advised pt to continue to try to eat foods like eat saltine crackers and recommended toasts. She will try these interventions. Explained to patient that if she doesn't feel any better or her symptoms persist, then she may need to go back to the ED. Pt worried that her liver biopsy will be post poned on Tuesday if she is not feeling any better. I asked pt to call our clinic on Monday if she was not feeling any better.

## 2016-11-02 ENCOUNTER — Encounter: Payer: Self-pay | Admitting: *Deleted

## 2016-11-02 ENCOUNTER — Ambulatory Visit
Admission: RE | Admit: 2016-11-02 | Discharge: 2016-11-02 | Disposition: A | Discharge status: Home / Self Care | Payer: Medicare Other | Source: Ambulatory Visit | Attending: Internal Medicine | Admitting: Internal Medicine

## 2016-11-02 NOTE — Progress Notes (Signed)
Patient called nursing navigation yesterday. Spoke with Raquel Sarna to discuss her financial concerns and cnl her liver biopsy date.  Pt willing to r/s her biopsy date. Pt Stated that she took her eliquis on Sunday by accident and for this reason, the bx needed to be post poned.   msg sent to scheduling team to r/s pt's bx date.

## 2016-11-04 ENCOUNTER — Telehealth: Payer: Self-pay | Admitting: *Deleted

## 2016-11-04 NOTE — Telephone Encounter (Signed)
Liver biopsy r/s for 11/12/16. Pt will need to arrive at 8am for a 9am appointment. Apt with Dr. Royetta Crochet f/u results has been moved to 11/23/16 at 130pm  Left vm on patient's home phone and pt's cell phone requesting call back from patient to discuss her appointments.

## 2016-11-05 NOTE — Telephone Encounter (Signed)
Left vm for patient on patient's cell. Asking patient to return my phone call to discuss her md apts.

## 2016-11-05 NOTE — Telephone Encounter (Signed)
Pt attempted to call cancer center but hung up on vm.  Attempted to reach back patient. Phone line went straight to voice mail.

## 2016-11-05 NOTE — Telephone Encounter (Signed)
Call attempt to patient's home # [phone]- no answer. Phone just rings.

## 2016-11-05 NOTE — Telephone Encounter (Signed)
Patient returned phone call at [phone] - bx dates and f/u apts discussed with patient. Pt expresses anxiety again about going to procedure. She states that she keeps having 2nd thoughts about not going to the procedure, but for now, please leave on schedule as is. She will need to get a ride to the appointment to and from biopsy procedure. She will call me back if there are any conflicts with the apt dates.

## 2016-11-09 ENCOUNTER — Ambulatory Visit: Payer: Medicare Other | Admitting: Internal Medicine

## 2016-11-09 IMAGING — CR DG CHEST 2V
2 series · 2 of 2 positions shown · non-contrast
Comparison: Chest x-rays dated 12/20/2015 and 04/23/2015.

CLINICAL DATA: Breast cancer, shortness of breath, heart
palpitations, dizziness.

EXAM:
CHEST  2 VIEW

[chest pa]
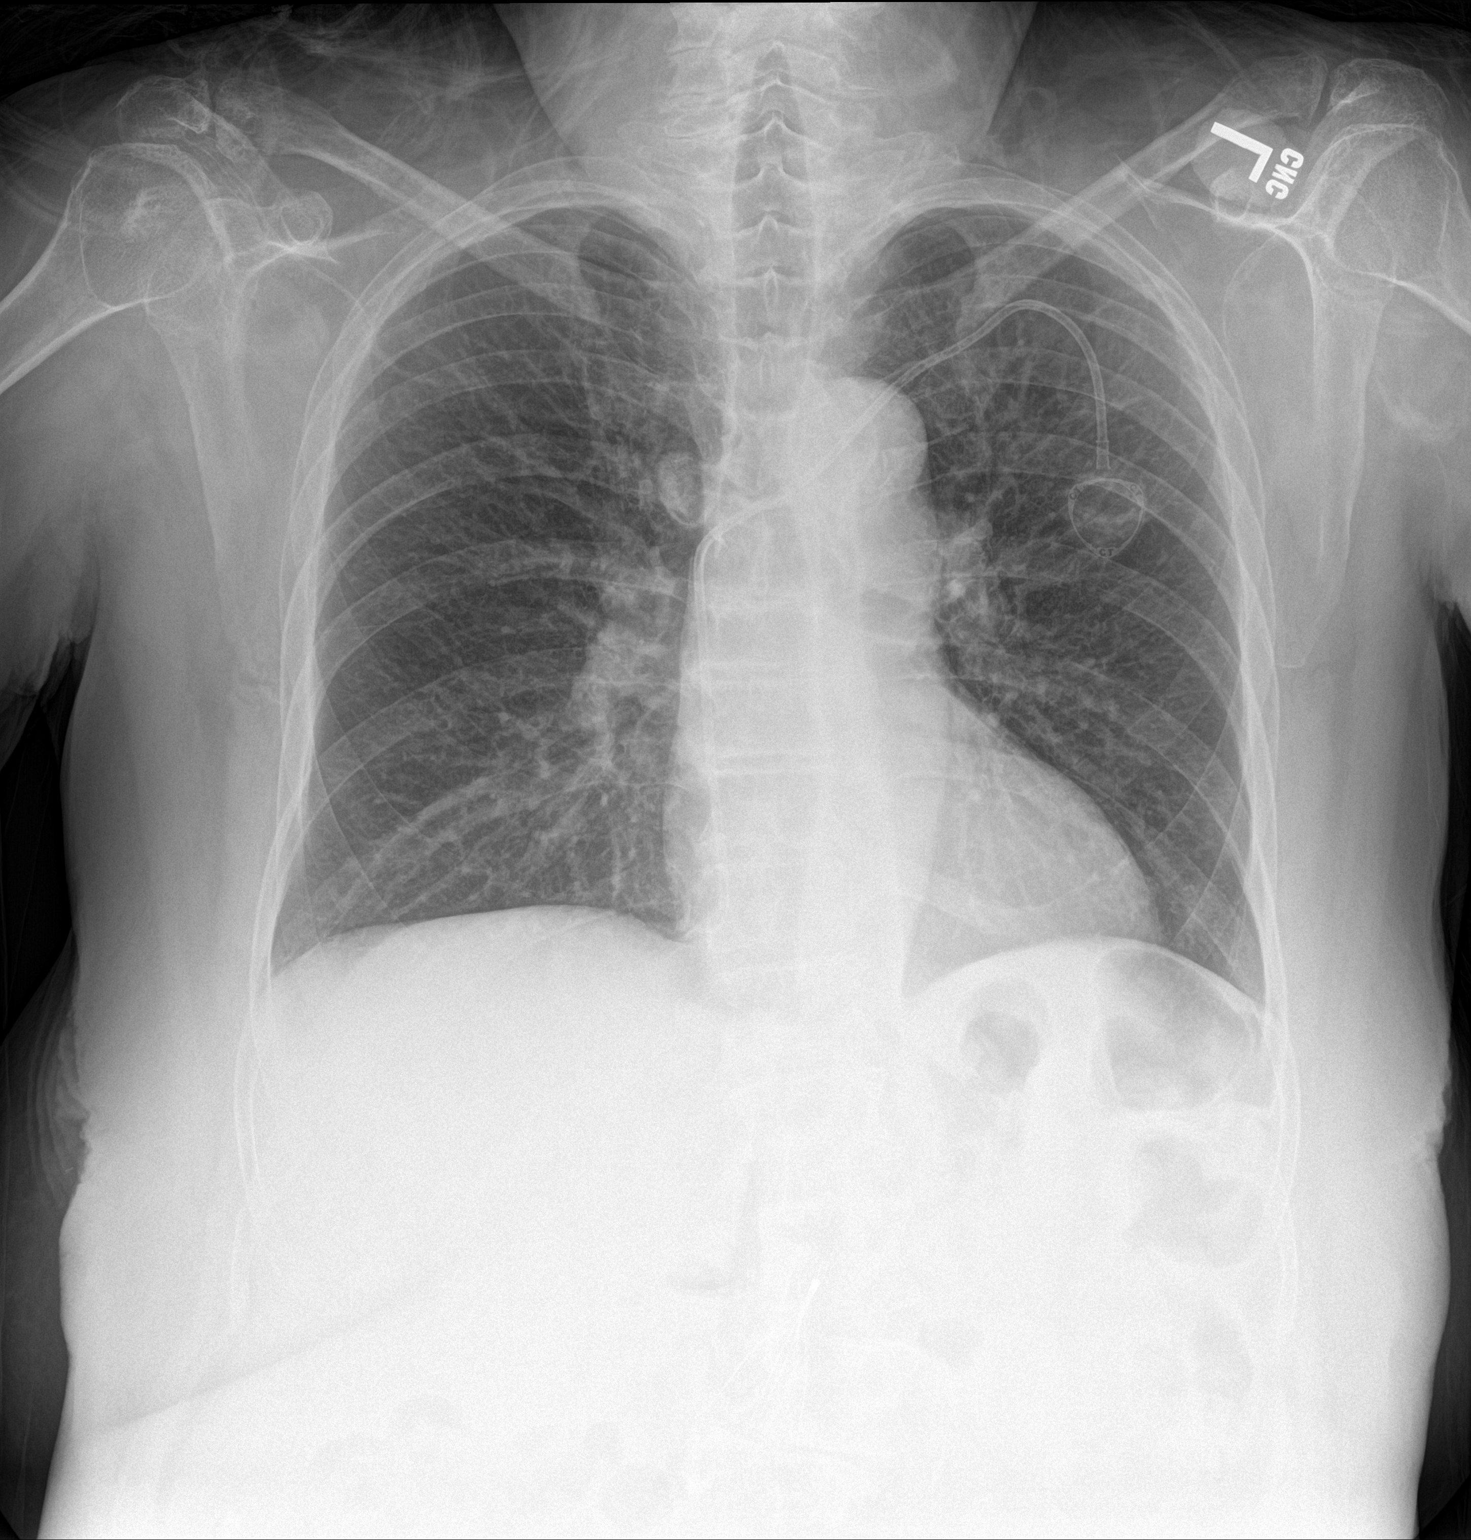

[chest lat]
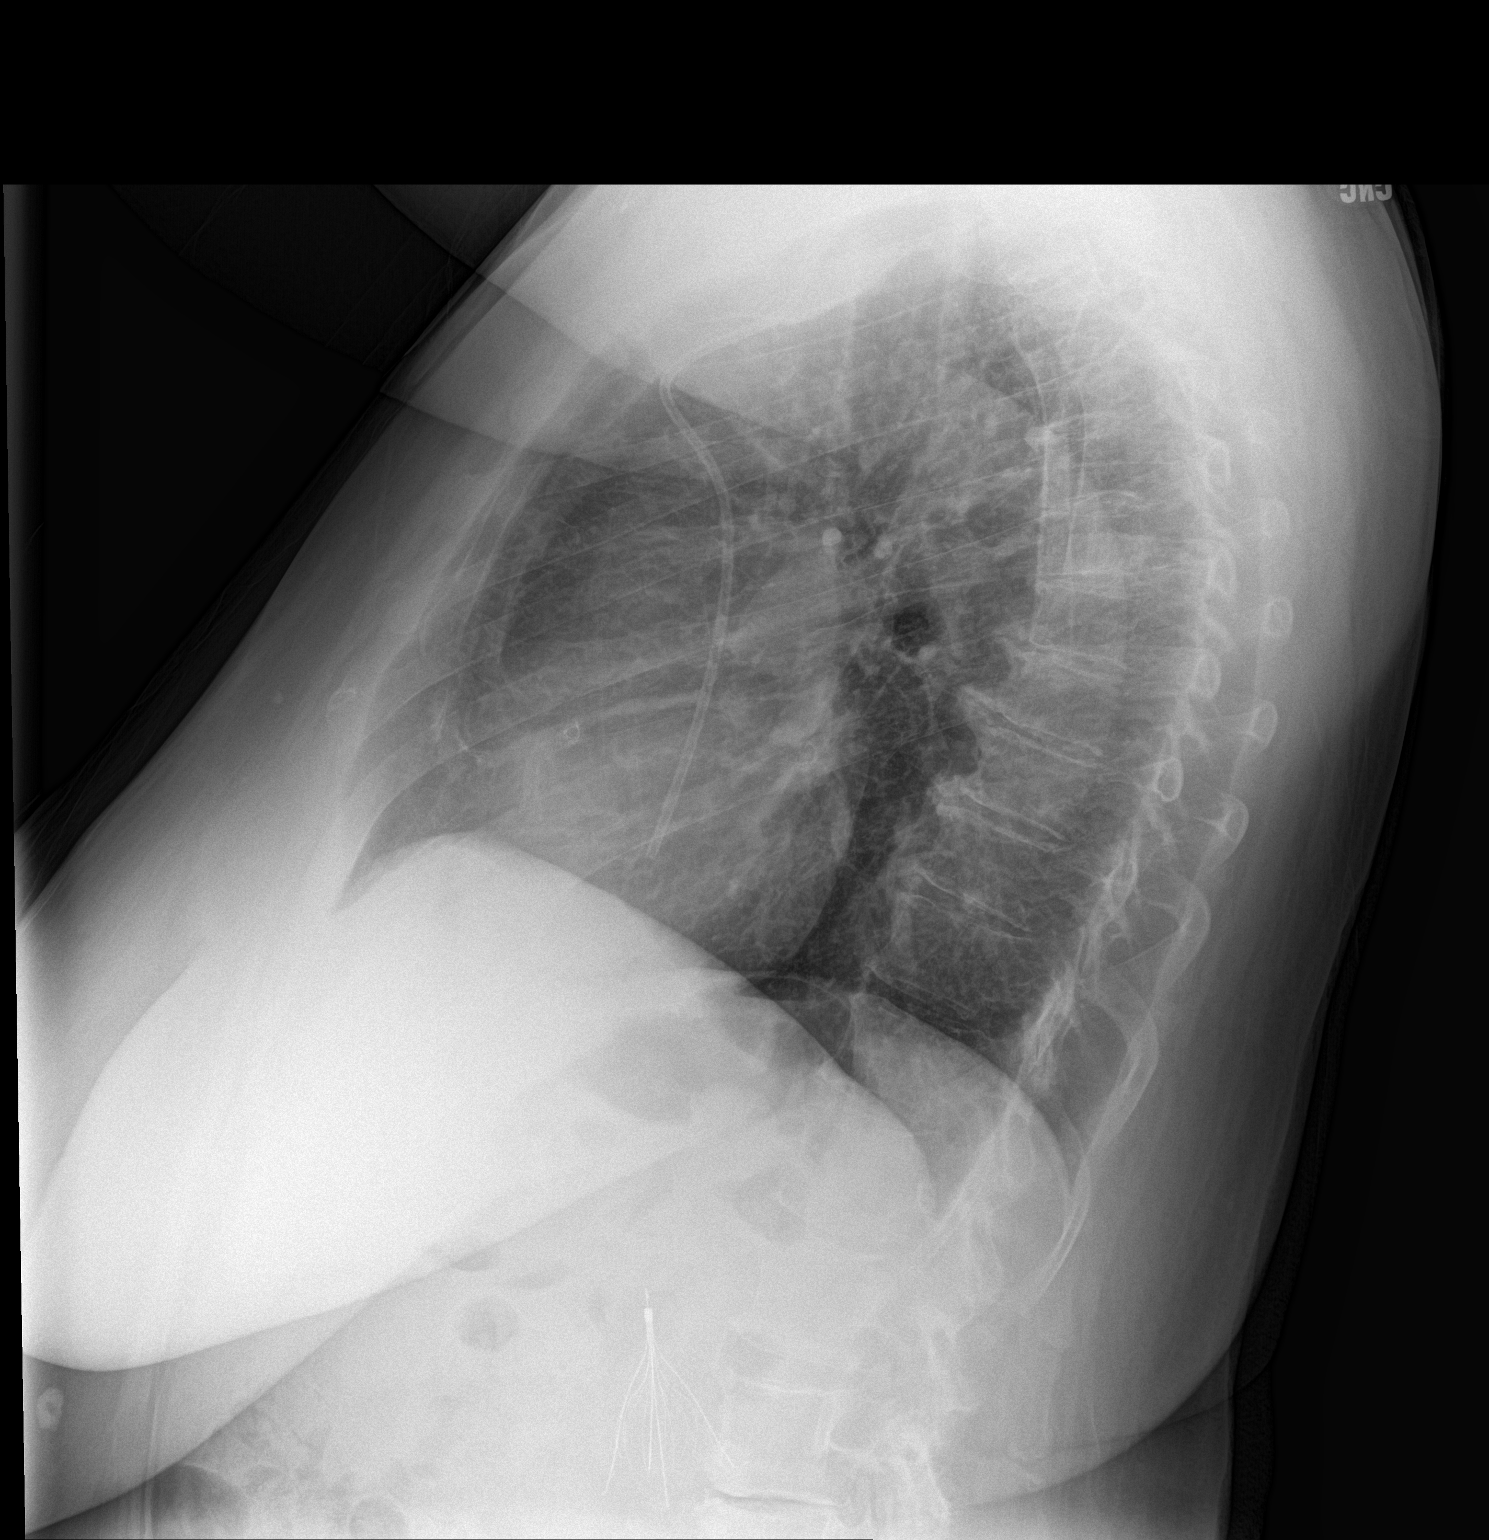

[2 of 2 positions shown; findings below may reference images not displayed]

FINDINGS: Heart size is normal. Overall cardiomediastinal silhouette is stable
in size and configuration. Left chest wall Port-A-Cath remains well
positioned with tip at the level of the right atrium.

Lungs are clear. No evidence of pneumonia. No pleural effusion or
pneumothorax seen.

Degenerative spurring again noted within the slightly kyphotic and
scoliotic thoracolumbar spine. Osseous structures about the chest
are otherwise unremarkable. IVC filter again appreciated within the
upper abdomen, stable in position.
IMPRESSION: Stable chest x-ray. Lungs are clear and there is no evidence of
acute cardiopulmonary abnormality.

## 2016-11-11 ENCOUNTER — Other Ambulatory Visit: Payer: Self-pay | Admitting: Radiology

## 2016-11-11 ENCOUNTER — Telehealth: Payer: Self-pay | Admitting: *Deleted

## 2016-11-11 NOTE — Telephone Encounter (Signed)
RN received phone call from [place], [place] in radiation therapy dept. Pt called radiation to inform nurse that she took her Eliquis at 1 pm yesterday. For this reason, her biopsy will need to be postponed.

## 2016-11-11 NOTE — Telephone Encounter (Signed)
Spoke with patient - she states, "I didn't mean to take the Eliquis. I had it in my medicine box at home. I'm so upset about this. Now I can not have my biopsy. I don't know now if I even what to go through this anymore. I am loosing weight. I don't know what the purpose of the biopsy. I just don't want to go through it. I don't want to have to get an in a gown. I don't think it's necessary. You can tell the radiology team that I'm not taking my bra nor my wig off for the procedure either. How do you even know this is cancer. I may be putting myself through this procedure for nothing." Active listening provided for patient. Pt informed that it is her choice whether to proceed with the biopsy.  Reinforced teaching to patient that md requested the biopsy in order to provide treatment recommendations for the patient. Pt replied, "well it's not my fault that I took my Eliquis- truly it's not." pt education reinforced and explained that the liver biopsy can not be safely performed unless her Eliquis is held for 48 hrs. Reinforced pt education on the risk factors for bleeding with liver biopsy. Pt stated, "well just call me when it's rescheduled. I will try to take the Eliquis out of my pill boxes a few days prior to my biopsy." pt reassured that a scheduler would contact her with an apt time for the r/s bx date.

## 2016-11-12 ENCOUNTER — Ambulatory Visit: Admission: RE | Admit: 2016-11-12 | Payer: Medicare Other | Source: Ambulatory Visit

## 2016-11-18 ENCOUNTER — Telehealth: Payer: Self-pay | Admitting: *Deleted

## 2016-11-18 NOTE — Telephone Encounter (Signed)
Called and left vm for patient to contact our clinic as soon as possible for her biopsy procedure date. Left 2 msgs-1 on pt's cell and home phone.

## 2016-11-22 ENCOUNTER — Telehealth: Payer: Self-pay | Admitting: *Deleted

## 2016-11-22 NOTE — Telephone Encounter (Signed)
Patient returned RN's phone call regarding her biopsy date. States that she thought her bx date would be this Tuesday 4/3, so she held her anticoagulants x 2 days. Explained to patient that her bx date is not until 4/10. She voiced frustration about "not knowing when her biopsy dates were going to be. I finally go the courage to get it done and now I can not even get it done this week. This makes me very upset. I already arranged for someone to take me to the appointments and everything." I explained to the patient that I left 2 voice mail msgs last week- asking patient to return my phone call. She states that she didn't get them until today as her "minutes on the phone had 'run' out." She states that she would ask [facility] to bring her next week for the apts. She stated that she would like the nurses in IR dept to access her "port a cath if any IV pain meds need to be given". I told her that I would document her request. She states that she would apply the "numbing cream on my port prior to coming to the biopsy just in case. I don't want them starting an IV line on me if at all possible."

## 2016-11-23 ENCOUNTER — Ambulatory Visit: Payer: Medicare Other | Admitting: Internal Medicine

## 2016-11-29 ENCOUNTER — Other Ambulatory Visit: Payer: Self-pay | Admitting: *Deleted

## 2016-11-29 DIAGNOSIS — F411 Generalized anxiety disorder: Secondary | ICD-10-CM

## 2016-11-29 MED ORDER — DIAZEPAM 5 MG PO TABS
5.0000 mg | ORAL_TABLET | Freq: Two times a day (BID) | ORAL | 0 refills | Status: DC | PRN
Start: 2016-11-29 — End: 2017-01-06

## 2016-11-30 ENCOUNTER — Ambulatory Visit
Admission: RE | Admit: 2016-11-30 | Discharge: 2016-11-30 | Disposition: A | Discharge status: Home / Self Care | Payer: Medicare Other | Source: Ambulatory Visit | Attending: Internal Medicine | Admitting: Internal Medicine

## 2016-11-30 ENCOUNTER — Other Ambulatory Visit: Payer: Self-pay | Admitting: Student

## 2016-11-30 ENCOUNTER — Telehealth: Payer: Self-pay | Admitting: *Deleted

## 2016-11-30 ENCOUNTER — Other Ambulatory Visit: Payer: Self-pay | Admitting: *Deleted

## 2016-11-30 DIAGNOSIS — Z7982 Long term (current) use of aspirin: Secondary | ICD-10-CM | POA: Diagnosis not present

## 2016-11-30 DIAGNOSIS — R160 Hepatomegaly, not elsewhere classified: Secondary | ICD-10-CM

## 2016-11-30 DIAGNOSIS — C787 Secondary malignant neoplasm of liver and intrahepatic bile duct: Secondary | ICD-10-CM | POA: Diagnosis not present

## 2016-11-30 DIAGNOSIS — F17210 Nicotine dependence, cigarettes, uncomplicated: Secondary | ICD-10-CM | POA: Diagnosis not present

## 2016-11-30 DIAGNOSIS — Z7901 Long term (current) use of anticoagulants: Secondary | ICD-10-CM | POA: Diagnosis not present

## 2016-11-30 DIAGNOSIS — Z79899 Other long term (current) drug therapy: Secondary | ICD-10-CM | POA: Diagnosis not present

## 2016-11-30 LAB — GLUCOSE, CAPILLARY: GLUCOSE-CAPILLARY: 162 mg/dL — AB (ref 65–99)

## 2016-11-30 LAB — CBC
HEMATOCRIT: 45.2 % (ref 35.0–47.0)
Hemoglobin: 14.9 g/dL (ref 12.0–16.0)
MCH: 28.5 pg (ref 26.0–34.0)
MCHC: 32.9 g/dL (ref 32.0–36.0)
MCV: 86.8 fL (ref 80.0–100.0)
Platelets: 143 10*3/uL — ABNORMAL LOW (ref 150–440)
RBC: 5.21 MIL/uL — AB (ref 3.80–5.20)
RDW: 18.1 % — ABNORMAL HIGH (ref 11.5–14.5)
WBC: 5.1 10*3/uL (ref 3.6–11.0)

## 2016-11-30 LAB — PROTIME-INR
INR: 1.11
Prothrombin Time: 14.4 seconds (ref 11.4–15.2)

## 2016-11-30 LAB — APTT: aPTT: 24 seconds (ref 24–36)

## 2016-11-30 MED ORDER — MIDAZOLAM HCL 2 MG/2ML IJ SOLN
INTRAMUSCULAR | Status: AC | PRN
  Administered 2016-11-30 (×2): 1 mg via INTRAVENOUS

## 2016-11-30 MED ORDER — FENTANYL CITRATE (PF) 100 MCG/2ML IJ SOLN
INTRAMUSCULAR | Status: AC | PRN
  Administered 2016-11-30 (×2): 50 ug via INTRAVENOUS

## 2016-11-30 MED ORDER — ONDANSETRON HCL 4 MG/2ML IJ SOLN
4.0000 mg | Freq: Once | INTRAMUSCULAR | Status: AC
Start: 2016-11-30 — End: 2016-11-30
  Administered 2016-11-30: 4 mg via INTRAVENOUS
  Filled 2016-11-30: qty 2

## 2016-11-30 MED ORDER — SODIUM CHLORIDE 0.9 % IV SOLN
INTRAVENOUS | Status: DC
  Administered 2016-11-30: 11:00:00 via INTRAVENOUS

## 2016-11-30 MED ORDER — HYDROCODONE-ACETAMINOPHEN 5-325 MG PO TABS
1.0000 | ORAL_TABLET | ORAL | Status: DC | PRN
  Administered 2016-11-30: 2 via ORAL
  Filled 2016-11-30: qty 2

## 2016-11-30 NOTE — OR Nursing (Signed)
Pt complaining of nausea, and pain right lateral abdomen with radiation across chest. Pt reassure, pain med from [facility], zofran iv given. Pt declined pain med until nausea improved. Pt states justt wants to throw up, but encouraged not to. Taking sips of diet coke.

## 2016-11-30 NOTE — Discharge Instructions (Signed)
Liver Biopsy, Care After  Refer to this sheet in the next few weeks. These instructions provide you with information on caring for yourself after your procedure. Your health care provider may also give you more specific instructions. Your treatment has been planned according to current medical practices, but problems sometimes occur. Call your health care provider if you have any problems or questions after your procedure.  What can I expect after the procedure?  After your procedure, it is typical to have the following:  · A small amount of discomfort in the area where the biopsy was done and in the right shoulder or shoulder blade.  · A small amount of bruising around the area where the biopsy was done and on the skin over the liver.  · Sleepiness and fatigue for the rest of the day.    Follow these instructions at home:  · Rest at home for 1-2 days or as directed by your health care provider.  · Have a friend or family member stay with you for at least 24 hours.  · Because of the medicines used during the procedure, you should not do the following things in the first 24 hours:  ? Drive.  ? Use machinery.  ? Be responsible for the care of other people.  ? Sign legal documents.  ? Take a bath or shower.  · There are many different ways to close and cover an incision, including stitches, skin glue, and adhesive strips. Follow your health care provider's instructions on:  ? Incision care.  ? Bandage (dressing) changes and removal.  ? Incision closure removal.  · Do not drink alcohol in the first week.  · Do not lift more than 5 pounds or play contact sports for 2 weeks after this test.  · Take medicines only as directed by your health care provider. Do not take medicine containing aspirin or non-steroidal anti-inflammatory medicines such as ibuprofen for 1 week after this test.  · It is your responsibility to get your test results.  Contact a health care provider if:  · You have increased bleeding from an incision  that results in more than a small spot of blood.  · You have redness, swelling, or increasing pain in any incisions.  · You notice a discharge or a bad smell coming from any of your incisions.  · You have a fever or chills.  Get help right away if:  · You develop swelling, bloating, or pain in your abdomen.  · You become dizzy or faint.  · You develop a rash.  · You are nauseous or vomit.  · You have difficulty breathing, feel short of breath, or feel faint.  · You develop chest pain.  · You have problems with your speech or vision.  · You have trouble balancing or moving your arms or legs.  This information is not intended to replace advice given to you by your health care provider. Make sure you discuss any questions you have with your health care provider.  Document Released: 02/26/2005 Document Revised: 01/15/2016 Document Reviewed: 10/05/2013  Elsevier Interactive Patient Education © 2017 Elsevier Inc.

## 2016-11-30 NOTE — Telephone Encounter (Signed)
Port accessed, unable to get blood or push fluid into it. States she repositioned her and still unable to get it to work

## 2016-11-30 NOTE — OR Nursing (Signed)
Accessed port but unable to aspirate or flush, PIV started left AC instead. Left message with triage nurse voice mail.

## 2016-11-30 NOTE — Telephone Encounter (Signed)
[name] made aware. He will address this at next office visit. Pt's port is often 'positional'.

## 2016-11-30 NOTE — Procedures (Signed)
Right lobe liver Lesion Bx 18 g EBL 0 Comp 0

## 2016-11-30 NOTE — H&P (Signed)
Chief Complaint: Patient was seen in consultation today for No chief complaint on file.  at the request of [name],Govinda Allen  Referring Physician(s): Cammie Sickle  Supervising Physician: Marybelle Killings  Patient Status: [facility] - Out-pt  History of Present Illness: Dawn Allen is a 66 y.o. female with a history of breast cancer, who underwent routine PET which showed a liver mass. She is referred for a biopsy. She is on Dilantin for seizures but has not had one in some time. She has been off Eliquis for three days (DVT).  Past Medical History:  Diagnosis Date  . Anxiety   . Breast cancer ([place]) 08/2015   right breast, chemo  . Cancer of right female breast ([place])   . Coronary artery disease    a. NSTEMI cath 08/02/14: LM nl, pLAD 50%, mLCx 30%, mRCA 95% s/p PCI/DES, 2nd lesion 40%, EF 55%  . Diabetes mellitus without complication ([place])   . DVT (deep venous thrombosis) ([facility])    LEFT LEG   . HLD (hyperlipidemia)   . Hypertension   . Lung cancer ([place])   . Lung nodule   . MI (myocardial infarction)   . Seizure disorder ([place])   . Seizures ([place])   . 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 ABDOMINAL HYSTERECTOMY     PARTIAL   . AXILLARY LYMPH NODE BIOPSY Right 05/26/2016   Procedure: AXILLARY LYMPH NODE BIOPSY;  Surgeon: Hubbard Robinson, MD;  Location: [facility] ORS;  Service: General;  Laterality: Right;  . BREAST BIOPSY Right 09/15/2015   positve  . CARDIAC CATHETERIZATION  08/02/2014  . CORONARY ANGIOPLASTY  08/02/2014   drug eluting stent placement  . IVC FILTER PLACEMENT ([facility] HX)    . LEG SURGERY Right    BLOOD CLOT  . MASTECTOMY, PARTIAL Right 05/26/2016   Procedure: MASTECTOMY PARTIAL;  Surgeon: Hubbard Robinson, MD;  Location: [facility] ORS;  Service: General;  Laterality: Right;  . PORTACATH PLACEMENT Left 10/13/2015   Procedure: INSERTION PORT-A-CATH;  Surgeon: Hubbard Robinson, MD;  Location: [facility]  ORS;  Service: General;  Laterality: Left;  . SENTINEL NODE BIOPSY Right 05/26/2016   Procedure: SENTINEL NODE BIOPSY;  Surgeon: Hubbard Robinson, MD;  Location: [facility] ORS;  Service: General;  Laterality: Right;    Allergies: Penicillins  Medications: Prior to Admission medications   Medication Sig Start Date End Date Taking? Authorizing Provider  Aspirin-Salicylamide-Caffeine (BC HEADACHE POWDER PO) Take 1 packet by mouth daily.   Yes Historical Provider, MD  atorvastatin (LIPITOR) 40 MG tablet Take 1 tablet by mouth daily. 05/24/16  Yes Historical Provider, MD  ciprofloxacin (CIPRO) 500 MG tablet Take 1 tablet (500 mg total) by mouth 2 (two) times daily. 10/26/16  Yes Carrie Mew, MD  diazepam (VALIUM) 5 MG tablet Take 1 tablet (5 mg total) by mouth 2 (two) times daily as needed. Reported on 12/16/2015 11/29/16  Yes Cammie Sickle, MD  empagliflozin (JARDIANCE) 25 MG TABS tablet Take 25 mg by mouth daily.   Yes Historical Provider, MD  lidocaine-prilocaine (EMLA) cream Apply 1 application topically as needed. Apply to port a cath site 1 hour before chemotherapy treatment. 10/06/15  Yes Cammie Sickle, MD  lisinopril-hydrochlorothiazide (PRINZIDE,ZESTORETIC) 20-25 MG per tablet Take 1 tablet by mouth daily.  07/26/14  Yes Historical Provider, MD  ondansetron (ZOFRAN ODT) 4 MG disintegrating tablet Take 1 tablet (4 mg total) by mouth every 8 (eight) hours  as needed for nausea or vomiting. 10/26/16  Yes Carrie Mew, MD  phenytoin (DILANTIN) 100 MG ER capsule Take 300 mg by mouth daily.    Yes Historical Provider, MD  pioglitazone (ACTOS) 45 MG tablet Take 1 tablet by mouth daily. 01/28/15  Yes Historical Provider, MD  VENTOLIN HFA 108 (90 Base) MCG/ACT inhaler Inhale 1 puff into the lungs every 6 (six) hours as needed. Reported on 01/20/2016 01/09/16  Yes Historical Provider, MD  apixaban (ELIQUIS) 2.5 MG TABS tablet Take 1 tablet (2.5 mg total) by mouth 2 (two) times daily. Reported on  01/01/2016 05/29/16   Hubbard Robinson, MD  HYDROcodone-acetaminophen (NORCO/VICODIN) 5-325 MG tablet Take 1 tablet by mouth every 8 (eight) hours as needed for moderate pain. 10/20/16   Cammie Sickle, MD  hydrocortisone cream 1 % Apply to affected area 2 times daily Patient not taking: Reported on 10/20/2016 07/09/16 07/09/17  Hubbard Robinson, MD  metroNIDAZOLE (FLAGYL) 500 MG tablet Take 1 tablet (500 mg total) by mouth 3 (three) times daily. Patient not taking: Reported on 11/30/2016 10/26/16   Carrie Mew, MD  polyethylene glycol powder [facility]) powder Take 255 g by mouth once. Take as directed 06/08/16 06/08/16  Cammie Sickle, MD     Family History  Problem Relation Age of Onset  . Heart attack Mother   . Breast cancer Sister 71    bilaterally breast cancer    Social History   Social History  . Marital status: Single    Spouse name: N/A  . Number of children: N/A  . Years of education: N/A   Social History Main Topics  . Smoking status: Current Every Day Smoker    Packs/day: 1.00    Years: 45.00    Types: Cigarettes  . Smokeless tobacco: Never Used  . Alcohol use No  . Drug use: No  . Sexual activity: Yes    Partners: Male   Other Topics Concern  . None   Social History Narrative  . None     Review of Systems: A 12 point ROS discussed and pertinent positives are indicated in the HPI above.  All other systems are negative.  Review of Systems  Vital Signs: BP (!) 132/94   Pulse 73   Temp 97.9 F (36.6 C) (Oral)   Resp 18   Ht '5\' 5"'$  (1.651 m)   Wt 160 lb (72.6 kg)   SpO2 99%   BMI 26.63 kg/m   Physical Exam  Constitutional: She is oriented to person, place, and time. She appears well-developed and well-nourished.  HENT:  Head: Normocephalic and atraumatic.  Cardiovascular: Normal rate and regular rhythm.   Pulmonary/Chest: Effort normal and breath sounds normal.  Neurological: She is alert and oriented to person, place, and time.     Mallampati Score: 2    Imaging: No results found.  Labs:  CBC:  Recent Labs  08/19/16 1414 10/20/16 1502 10/26/16 1935 11/30/16 0948  WBC 4.9 5.1 7.2 5.1  HGB 13.4 14.6 14.9 14.9  HCT 39.7 43.0 44.4 45.2  PLT 143* 141* 136* 143*    COAGS:  Recent Labs  01/29/16 1705 11/30/16 0948  INR 0.97 1.11  APTT  --  24    BMP:  Recent Labs  08/04/16 1540 08/19/16 1414 10/20/16 1502 10/26/16 1935  NA 137 132* 136 137  K 4.0 3.3* 4.3 3.6  CL 98* 99* 99* 100*  CO2 '31 28 31 28  '$ GLUCOSE 183* 135* 225* 166*  BUN  31* 29* 24* 24*  CALCIUM 9.3 8.6* 9.2 9.0  CREATININE 0.90 0.94 0.93 0.86  GFRNONAA >60 >60 >60 >60  GFRAA >60 >60 >60 >60    LIVER FUNCTION TESTS:  Recent Labs  05/04/16 1009 08/04/16 1540 10/20/16 1502 10/26/16 1935  BILITOT <0.1* 0.3 0.3 0.4  AST 14* '19 23 19  '$ ALT 13* 13* 22 16  ALKPHOS 224* 209* 248* 220*  PROT 7.5 7.7 7.8 7.4  ALBUMIN 3.5 3.5 3.6 3.6    TUMOR MARKERS: No results for input(s): AFPTM, CEA, CA199, CHROMGRNA in the last 8760 hours.  Assessment and Plan:  Liver mass. Biopsy to follow.  Thank you for this interesting consult.  I greatly enjoyed meeting ARINA TORRY and look forward to participating in their care.  A copy of this report was sent to the requesting provider on this date.  Electronically Signed: Carlyon Nolasco, ART A 11/30/2016, 10:21 AM   I spent a total of  40 Minutes   in face to face in clinical consultation, greater than 50% of which was counseling/coordinating care for liver biopsy.

## 2016-12-01 ENCOUNTER — Inpatient Hospital Stay: Payer: Medicare Other

## 2016-12-02 ENCOUNTER — Telehealth: Payer: Self-pay | Admitting: *Deleted

## 2016-12-02 NOTE — Telephone Encounter (Signed)
Patient called to report severe chest pain 10/10. "severe pain at liver biopsy site." reports Nausea/vomiting not relieved by zofran. Has vomited 3 times today. Pain is intensifying per patient. Pt states that she "has not had a bowel movement in 2 or more weeks." I asked patient to go to the Emergency room asap to be evaluated. Pt states that she would prefer to go to the [facility] ER for further evaluation. She states that she will go to the ER now.

## 2016-12-03 ENCOUNTER — Telehealth: Payer: Self-pay | Admitting: *Deleted

## 2016-12-03 NOTE — Telephone Encounter (Signed)
Pathology Call report by Dr. Dicie Beam 12/02/16 at 1530.  Liver bx-metastatic ca consistent with breast cancer origin. [name] made aware of pathology results 12/03/16 at 9 am.  Also on clinical note, also informed md of patient's symptoms complaints yesterday. md made aware that RN advised patient to go to ED for chest pain evaluation. md made aware that pt did not go to ED as advised.

## 2016-12-06 ENCOUNTER — Inpatient Hospital Stay: Payer: Medicare Other

## 2016-12-06 ENCOUNTER — Encounter: Payer: Self-pay | Admitting: *Deleted

## 2016-12-06 ENCOUNTER — Inpatient Hospital Stay: Payer: Medicare Other | Attending: Internal Medicine | Admitting: Internal Medicine

## 2016-12-06 VITALS — BP 162/88 | HR 91 | Temp 97.6°F | Resp 22

## 2016-12-06 DIAGNOSIS — Z85118 Personal history of other malignant neoplasm of bronchus and lung: Secondary | ICD-10-CM | POA: Diagnosis not present

## 2016-12-06 DIAGNOSIS — F419 Anxiety disorder, unspecified: Secondary | ICD-10-CM | POA: Insufficient documentation

## 2016-12-06 DIAGNOSIS — Z9221 Personal history of antineoplastic chemotherapy: Secondary | ICD-10-CM | POA: Insufficient documentation

## 2016-12-06 DIAGNOSIS — Z171 Estrogen receptor negative status [ER-]: Secondary | ICD-10-CM

## 2016-12-06 DIAGNOSIS — C73 Malignant neoplasm of thyroid gland: Secondary | ICD-10-CM | POA: Insufficient documentation

## 2016-12-06 DIAGNOSIS — Z7901 Long term (current) use of anticoagulants: Secondary | ICD-10-CM | POA: Diagnosis not present

## 2016-12-06 DIAGNOSIS — Z7982 Long term (current) use of aspirin: Secondary | ICD-10-CM | POA: Diagnosis not present

## 2016-12-06 DIAGNOSIS — G40909 Epilepsy, unspecified, not intractable, without status epilepticus: Secondary | ICD-10-CM | POA: Insufficient documentation

## 2016-12-06 DIAGNOSIS — F17210 Nicotine dependence, cigarettes, uncomplicated: Secondary | ICD-10-CM | POA: Diagnosis not present

## 2016-12-06 DIAGNOSIS — I2510 Atherosclerotic heart disease of native coronary artery without angina pectoris: Secondary | ICD-10-CM | POA: Insufficient documentation

## 2016-12-06 DIAGNOSIS — I252 Old myocardial infarction: Secondary | ICD-10-CM | POA: Diagnosis not present

## 2016-12-06 DIAGNOSIS — Z923 Personal history of irradiation: Secondary | ICD-10-CM | POA: Insufficient documentation

## 2016-12-06 DIAGNOSIS — Z79899 Other long term (current) drug therapy: Secondary | ICD-10-CM | POA: Insufficient documentation

## 2016-12-06 DIAGNOSIS — G893 Neoplasm related pain (acute) (chronic): Secondary | ICD-10-CM | POA: Insufficient documentation

## 2016-12-06 DIAGNOSIS — E119 Type 2 diabetes mellitus without complications: Secondary | ICD-10-CM | POA: Diagnosis not present

## 2016-12-06 DIAGNOSIS — Z86718 Personal history of other venous thrombosis and embolism: Secondary | ICD-10-CM | POA: Insufficient documentation

## 2016-12-06 DIAGNOSIS — Z9011 Acquired absence of right breast and nipple: Secondary | ICD-10-CM | POA: Insufficient documentation

## 2016-12-06 DIAGNOSIS — E785 Hyperlipidemia, unspecified: Secondary | ICD-10-CM | POA: Diagnosis not present

## 2016-12-06 DIAGNOSIS — F22 Delusional disorders: Secondary | ICD-10-CM

## 2016-12-06 DIAGNOSIS — C50811 Malignant neoplasm of overlapping sites of right female breast: Secondary | ICD-10-CM | POA: Insufficient documentation

## 2016-12-06 DIAGNOSIS — Z5111 Encounter for antineoplastic chemotherapy: Secondary | ICD-10-CM | POA: Diagnosis present

## 2016-12-06 DIAGNOSIS — C787 Secondary malignant neoplasm of liver and intrahepatic bile duct: Secondary | ICD-10-CM | POA: Insufficient documentation

## 2016-12-06 DIAGNOSIS — G4700 Insomnia, unspecified: Secondary | ICD-10-CM | POA: Diagnosis not present

## 2016-12-06 MED ORDER — HYDROCODONE-ACETAMINOPHEN 5-325 MG PO TABS: 1.0000 | ORAL_TABLET | Freq: Three times a day (TID) | ORAL | 0 refills | Status: DC | PRN

## 2016-12-06 NOTE — Progress Notes (Signed)
  Oncology Nurse Navigator Documentation  Navigator Location: [facility] (12/06/16 1500)   )Navigator Encounter Type: Follow-up Appt (12/06/16 1500)                       Treatment Phase: Other (metastatic ) (12/06/16 1500) Barriers/Navigation Needs: Financial (other - support) (12/06/16 1500)   Interventions: Other (grocery card and support) (12/06/16 1500)            Acuity: Level 2 (12/06/16 1500)    Met with [place] and her cousin today during her follow up with [name].  She received results of her liver biopsy.  She wants to proceed with chemotherapy treatment.  Offered support.  Needs assistance with groceries.  Called in a $50 gift card to [employer].  She is to call if she has any questions or needs.     Time Spent with Patient: 90 (12/06/16 1500)

## 2016-12-06 NOTE — Progress Notes (Signed)
Pt here for liver biopsy results. Pt has a clinical h/o breast cancer.  Patient accompanied by her cousin "Delois" today for test results. Pt states that her pain at her liver biopsy site has not improved. Rates pain 10/10. -describes pain as a "constant discomfort" Pt requests for Norco prescription. States that she is using 5 BC powder packets per day.  Pt c/o chronic constipation. Has not had a bowel movement in 2 to 3 weeks. Using Miralax- daily with no relief. Does not use any other laxative.  Pt very anxious regarding her port a cath. RN in specials Unable to get the port flushed on liver biopsy. Pt declines port being accessed to day. Discussed with patient that often her port a cath is positional". Advised pt that cancer center could reattempt port access. If not successful, discuss the next potential step of a 'dye study." pt refused "port access today."

## 2016-12-06 NOTE — Progress Notes (Signed)
[place] OFFICE PROGRESS NOTE  Patient Care Team: Remi Haggard, FNP as PCP - General (Family Medicine)   SUMMARY OF ONCOLOGIC HISTORY:  Oncology History   # JAN 2017- Right Breast IDC;STAGE II T3 [5x7cm] N0 [right Ax Ln Bx- neg]; ER/PR/Her 2 NEG; carbo-Taxol w x10; May 5th [place]- Improving breast mass; OCT 5th- Lumpec & SLNBx [Dr.Loflin]- ypT3 [17m] ypN0 (sn) s/p RT.    # March 2018- PET liver lesion; April 2018- Liver bx- RECURRENT Breast cancer; ER/PR/Her-2 Neu-Pending.  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   # Smoker; ? Paranoia; Feb 2017- MUGA scan- 67%  # Molecular testing- MMR ordered- April 2018.      Carcinoma of overlapping sites of right breast in female, estrogen receptor negative ([facility]   INTERVAL HISTORY: Poor- vague historian- secondary to psychiatric illness.   [age]ear old female patient with above breast cancer- triple negative- Initially stage II noted to have a lesion in the liver on the PET scan in January/February 2018. Patient finally underwent liver biopsy/she is here to review the results of her biopsy; and also to discuss treatment options. Patient is accompanied by her cousin.  Patient complains of pain at the site of liver biopsy. She states she has been taking aspirin for pain control.  Otherwise patient denies any unusual shortness of breath or chest pain or cough. Denies any tingling or numbness. She is very anxious and upset.  REVIEW OF SYSTEMS: Difficult to assess given as patient is a poor historian.  PAST MEDICAL HISTORY :  Past Medical History:  Diagnosis Date  . Anxiety   . Breast [place]   right breast, chemo  . Cancer of right female breast ([facility]  . Coronary artery disease    a. NSTEMI cath 08/02/14: LM  nl, pLAD 50%, mLCx 30%, mRCA 95% s/p PCI/DES, 2nd lesion 40%, EF 55%  . Diabetes mellitus without complication ([facility]  . DVT (deep venous thrombosis) ([facility])    LEFT LEG   . HLD (hyperlipidemia)   . Hypertension   . Lung cancer ([facility]  . Lung nodule   . MI (myocardial infarction) ([facility]  . Seizure disorder ([facility]  . Seizures ([facility] [place]  . Squamous cell lung cancer ([facility]/2016    PAST SURGICAL HISTORY :   Past Surgical History:  Procedure Laterality Date  . ABDOMINAL HYSTERECTOMY     PARTIAL   . AXILLARY LYMPH NODE BIOPSY Right 05/26/2016   Procedure: AXILLARY LYMPH NODE BIOPSY;  Surgeon: CaHubbard RobinsonMD;  Location: [facility] ORS;  Service: General;  Laterality: Right;  . BREAST BIOPSY Right 09/15/2015   positve  . CARDIAC CATHETERIZATION  08/02/2014  . CORONARY ANGIOPLASTY  08/02/2014   drug eluting stent placement  . IVC FILTER PLACEMENT ([facility] HX)    . LEG SURGERY Right    BLOOD CLOT  . MASTECTOMY, PARTIAL Right 05/26/2016   Procedure: MASTECTOMY PARTIAL;  Surgeon: CaHubbard RobinsonMD;  Location: [facility] ORS;  Service: General;  Laterality: Right;  . PORTACATH PLACEMENT Left 10/13/2015   Procedure: INSERTION PORT-A-CATH;  Surgeon: CaHubbard RobinsonMD;  Location: [facility] ORS;  Service: General;  Laterality: Left;  . SENTINEL NODE BIOPSY Right 05/26/2016   Procedure: SENTINEL NODE  BIOPSY;  Surgeon: Hubbard Robinson, MD;  Location: [facility] ORS;  Service: General;  Laterality: Right;    FAMILY HISTORY :   Family History  Problem Relation Age of Onset  . Heart attack Mother   . Breast cancer Sister 57    bilaterally breast cancer    SOCIAL HISTORY:   Social History  Substance Use Topics  . Smoking status: Current Every Day Smoker    Packs/day: 1.00    Years: 45.00    Types: Cigarettes  . Smokeless tobacco: Never Used  . Alcohol use No    ALLERGIES:  is allergic to penicillins.  MEDICATIONS:  Current Outpatient Prescriptions  Medication Sig Dispense Refill  .  apixaban (ELIQUIS) 2.5 MG TABS tablet Take 1 tablet (2.5 mg total) by mouth 2 (two) times daily. Reported on 01/01/2016 60 tablet 0  . Aspirin-Salicylamide-Caffeine (BC HEADACHE POWDER PO) Take 1 packet by mouth daily.    Marland Kitchen atorvastatin (LIPITOR) 40 MG tablet Take 1 tablet by mouth daily.    . diazepam (VALIUM) 5 MG tablet Take 1 tablet (5 mg total) by mouth 2 (two) times daily as needed. Reported on 12/16/2015 60 tablet 0  . empagliflozin (JARDIANCE) 25 MG TABS tablet Take 25 mg by mouth daily.    Marland Kitchen lisinopril-hydrochlorothiazide (PRINZIDE,ZESTORETIC) 20-25 MG per tablet Take 1 tablet by mouth daily.     . phenytoin (DILANTIN) 100 MG ER capsule Take 300 mg by mouth daily.     . pioglitazone (ACTOS) 45 MG tablet Take 1 tablet by mouth daily.    Marland Kitchen HYDROcodone-acetaminophen (NORCO/VICODIN) 5-325 MG tablet Take 1 tablet by mouth every 8 (eight) hours as needed for moderate pain. 40 tablet 0  . lidocaine-prilocaine (EMLA) cream Apply 1 application topically as needed. Apply to port a cath site 1 hour before chemotherapy treatment. (Patient not taking: Reported on 12/06/2016) 30 g 6  . ondansetron (ZOFRAN ODT) 4 MG disintegrating tablet Take 1 tablet (4 mg total) by mouth every 8 (eight) hours as needed for nausea or vomiting. (Patient not taking: Reported on 12/06/2016) 20 tablet 0  . polyethylene glycol powder (MIRALAX) powder Take 255 g by mouth once. Take as directed 255 g 0  . VENTOLIN HFA 108 (90 Base) MCG/ACT inhaler Inhale 1 puff into the lungs every 6 (six) hours as needed. Reported on 01/20/2016     No current facility-administered medications for this visit.    Facility-Administered Medications Ordered in Other Visits  Medication Dose Route Frequency Provider Last Rate Last Dose  . albuterol (PROVENTIL) (2.5 MG/3ML) 0.083% nebulizer solution 2.5 mg  2.5 mg Nebulization Once Nestor Lewandowsky, MD      . sodium chloride flush (NS) 0.9 % injection 10 mL  10 mL Intravenous PRN Cammie Sickle, MD    10 mL at 12/01/15 0850    PHYSICAL EXAMINATION: ECOG PERFORMANCE STATUS: 0 - Asymptomatic  BP (!) 162/88   Pulse 91   Temp 97.6 F (36.4 C) (Tympanic)   Resp (!) 22   There were no vitals filed for this visit.  GENERAL: Well-nourished well-developed; Alert, no distress and comfortable. Accompanied by her cousin. EYES: no pallor or icterus OROPHARYNX: no thrush or ulceration.   NECK: supple, no masses felt LYMPH: no palpable lymphadenopathy in the cervical, axillary or inguinal regions LUNGS: clear to auscultation and No wheeze or crackles HEART/CVS: regular rate & rhythm and no murmurs; No lower extremity edema ABDOMEN:abdomen soft, non-tender and normal bowel sounds; site of liver biopsy well-healed. No infection.  Musculoskeletal:no cyanosis of digits and no clubbing  PSYCH: alert & oriented x 3; anxious/tearful. NEURO: no focal motor/sensory deficits Skin- normal limits.  LABORATORY DATA:  I have reviewed the data as listed    Component Value Date/Time   NA 137 10/26/2016 1935   NA 141 08/03/2014 0050   K 3.6 10/26/2016 1935   K 3.3 (L) 08/03/2014 0050   CL 100 (L) 10/26/2016 1935   CL 108 (H) 08/03/2014 0050   CO2 28 10/26/2016 1935   CO2 27 08/03/2014 0050   GLUCOSE 166 (H) 10/26/2016 1935   GLUCOSE 164 (H) 08/03/2014 0050   BUN 24 (H) 10/26/2016 1935   BUN 14 08/03/2014 0050   CREATININE 0.86 10/26/2016 1935   CREATININE 0.68 08/03/2014 0050   CALCIUM 9.0 10/26/2016 1935   CALCIUM 8.2 (L) 08/03/2014 0050   PROT 7.4 10/26/2016 1935   PROT 6.7 04/11/2013 2241   ALBUMIN 3.6 10/26/2016 1935   ALBUMIN 2.7 (L) 04/11/2013 2241   AST 19 10/26/2016 1935   AST 13 (L) 04/11/2013 2241   ALT 16 10/26/2016 1935   ALT 16 04/11/2013 2241   ALKPHOS 220 (H) 10/26/2016 1935   ALKPHOS 207 (H) 04/11/2013 2241   BILITOT 0.4 10/26/2016 1935   BILITOT 0.1 (L) 04/11/2013 2241   GFRNONAA >60 10/26/2016 1935   GFRNONAA >60 08/03/2014 0050   GFRNONAA >60 04/11/2013 2241    GFRAA >60 10/26/2016 1935   GFRAA >60 08/03/2014 0050   GFRAA >60 04/11/2013 2241    No results found for: SPEP, UPEP  Lab Results  Component Value Date   WBC 5.1 11/30/2016   NEUTROABS 3.3 10/20/2016   HGB 14.9 11/30/2016   HCT 45.2 11/30/2016   MCV 86.8 11/30/2016   PLT 143 (L) 11/30/2016      Chemistry      Component Value Date/Time   NA 137 10/26/2016 1935   NA 141 08/03/2014 0050   K 3.6 10/26/2016 1935   K 3.3 (L) 08/03/2014 0050   CL 100 (L) 10/26/2016 1935   CL 108 (H) 08/03/2014 0050   CO2 28 10/26/2016 1935   CO2 27 08/03/2014 0050   BUN 24 (H) 10/26/2016 1935   BUN 14 08/03/2014 0050   CREATININE 0.86 10/26/2016 1935   CREATININE 0.68 08/03/2014 0050      Component Value Date/Time   CALCIUM 9.0 10/26/2016 1935   CALCIUM 8.2 (L) 08/03/2014 0050   ALKPHOS 220 (H) 10/26/2016 1935   ALKPHOS 207 (H) 04/11/2013 2241   AST 19 10/26/2016 1935   AST 13 (L) 04/11/2013 2241   ALT 16 10/26/2016 1935   ALT 16 04/11/2013 2241   BILITOT 0.4 10/26/2016 1935   BILITOT 0.1 (L) 04/11/2013 2241        ASSESSMENT & PLAN:   Carcinoma of overlapping sites of right breast in female, estrogen receptor negative ([place]) # Recurrent/metastatic breast cancer- biopsy-proven liver lesion. Breast profile pending [previously Negative]. We will also check MMR. Patient will also be a good candidate for BRCA testing.   # Long discussion the patient and her cousin regarding the incurable nature of her cancer. I would recommend palliative chemotherapy. Patient understands that chemotherapy will be indefinite; and also chemotherapy is most likely to shrink the disease/slow down the progression; but not likely cure for cancer.  # Recommend Taxol weekly; discussed the potential side effects.   # Chronic pain/and pain from breast surgery- continue hydrocodone1 every 8 hours as needed. New prescription given.  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  # History of stage I lung cancer status post SBR T- currently no recurrence.  # Paranoia/psychiatric illness- recommend psychiatric evaluation; discuss next visit. Patient competent to make her own decisions. This will have significant impact on her treatment.   # Difficulty with Mediport access- recommend accessing at time of chemotherapy. If cannot be accessed recommend- portogram.   # Start chemotherapy in 1 week/labs; follow-up with me in 2 weeks/labs.   # I reviewed the blood work- with the patient/cousin in detail; also reviewed the imaging independently [as summarized above]; and with the patient in detail.      Cammie Sickle, MD 12/07/2016 8:31 AM

## 2016-12-06 NOTE — Assessment & Plan Note (Addendum)
#  Recurrent/metastatic breast cancer- biopsy-proven liver lesion. Breast profile pending [previously Negative]. We will also check MMR. Patient will also be a good candidate for BRCA testing.   # Long discussion the patient and her cousin regarding the incurable nature of her cancer. I would recommend palliative chemotherapy. Patient understands that chemotherapy will be indefinite; and also chemotherapy is most likely to shrink the disease/slow down the progression; but not likely cure for cancer.  # Recommend Taxol weekly; discussed the potential side effects.   # Chronic pain/and pain from breast surgery- continue hydrocodone1 every 8 hours as needed. New prescription given.  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  # History of stage I lung cancer status post SBR T- currently no recurrence.  # Paranoia/psychiatric illness- recommend psychiatric evaluation; discuss next visit. Patient competent to make her own decisions. This will have significant impact on her treatment.   # Difficulty with Mediport access- recommend accessing at time of chemotherapy. If cannot be accessed recommend- portogram.   # Start chemotherapy in 1 week/labs; follow-up with me in 2 weeks/labs.   # I reviewed the blood work- with the patient/cousin in detail; also reviewed the imaging independently [as summarized above]; and with the patient in detail.

## 2016-12-10 NOTE — Progress Notes (Signed)
START ON PATHWAY REGIMEN - Breast     A cycle is every 28 days (3 weeks on and 1 week off):     Paclitaxel   **Always confirm dose/schedule in your pharmacy ordering system**    Patient Characteristics: Metastatic Chemotherapy, HER2 Negative/Unknown/Equivocal, ER -[facility], First Line Therapeutic Status: Distant Metastases BRCA Mutation Status: Awaiting Test Results ER Status: Negative (-) HER2 Status: Unknown Would you be surprised if this patient died  in the next year? I would be surprised if this patient died in the next year PR Status: Negative (-) Line of therapy: First Line  Intent of Therapy: Non-Curative / Palliative Intent, Discussed with Patient

## 2016-12-13 ENCOUNTER — Inpatient Hospital Stay: Payer: Medicare Other

## 2016-12-13 ENCOUNTER — Telehealth: Payer: Self-pay | Admitting: *Deleted

## 2016-12-13 LAB — SURGICAL PATHOLOGY

## 2016-12-13 NOTE — Telephone Encounter (Signed)
Per Maudie Mercury in radiation therapy, patient called to cnl her chemotherapy apts today. Pt reports that she had death in family.  Requested a return phone call to r/s her treatment-pt only wants a return a phone call tomorrow.

## 2016-12-13 NOTE — Telephone Encounter (Signed)
Called and left vm for pt that her tx date has been r/s for 12/14/16. Left vm for patient- requesting call back.- also offered condolences for her loss.

## 2016-12-14 ENCOUNTER — Encounter: Payer: Self-pay | Admitting: Internal Medicine

## 2016-12-14 ENCOUNTER — Inpatient Hospital Stay: Payer: Medicare Other

## 2016-12-14 ENCOUNTER — Other Ambulatory Visit: Payer: Self-pay | Admitting: Internal Medicine

## 2016-12-14 ENCOUNTER — Other Ambulatory Visit: Payer: Self-pay | Admitting: *Deleted

## 2016-12-14 VITALS — BP 101/67 | HR 81 | Temp 98.1°F | Resp 18

## 2016-12-14 DIAGNOSIS — C50811 Malignant neoplasm of overlapping sites of right female breast: Secondary | ICD-10-CM

## 2016-12-14 DIAGNOSIS — Z5111 Encounter for antineoplastic chemotherapy: Secondary | ICD-10-CM | POA: Diagnosis not present

## 2016-12-14 DIAGNOSIS — Z95828 Presence of other vascular implants and grafts: Secondary | ICD-10-CM

## 2016-12-14 DIAGNOSIS — Z171 Estrogen receptor negative status [ER-]: Secondary | ICD-10-CM

## 2016-12-14 LAB — CBC WITH DIFFERENTIAL/PLATELET
Basophils Absolute: 0 10*3/uL (ref 0–0.1)
Basophils Relative: 1 %
Eosinophils Absolute: 0.1 10*3/uL (ref 0–0.7)
Eosinophils Relative: 1 %
HEMATOCRIT: 41.2 % (ref 35.0–47.0)
Hemoglobin: 14.1 g/dL (ref 12.0–16.0)
LYMPHS ABS: 0.7 10*3/uL — AB (ref 1.0–3.6)
LYMPHS PCT: 10 %
MCH: 29.2 pg (ref 26.0–34.0)
MCHC: 34.1 g/dL (ref 32.0–36.0)
MCV: 85.7 fL (ref 80.0–100.0)
MONO ABS: 0.5 10*3/uL (ref 0.2–0.9)
MONOS PCT: 7 %
NEUTROS ABS: 6.4 10*3/uL (ref 1.4–6.5)
Neutrophils Relative %: 81 %
Platelets: 165 10*3/uL (ref 150–440)
RBC: 4.81 MIL/uL (ref 3.80–5.20)
RDW: 16.8 % — ABNORMAL HIGH (ref 11.5–14.5)
WBC: 7.8 10*3/uL (ref 3.6–11.0)

## 2016-12-14 LAB — COMPREHENSIVE METABOLIC PANEL
ALBUMIN: 3.2 g/dL — AB (ref 3.5–5.0)
ALK PHOS: 234 U/L — AB (ref 38–126)
ALT: 50 U/L (ref 14–54)
ANION GAP: 9 (ref 5–15)
AST: 67 U/L — ABNORMAL HIGH (ref 15–41)
BUN: 30 mg/dL — ABNORMAL HIGH (ref 6–20)
CO2: 25 mmol/L (ref 22–32)
Calcium: 8.2 mg/dL — ABNORMAL LOW (ref 8.9–10.3)
Chloride: 100 mmol/L — ABNORMAL LOW (ref 101–111)
Creatinine, Ser: 0.73 mg/dL (ref 0.44–1.00)
GFR calc Af Amer: >60 mL/min (ref >60)
GFR calc non Af Amer: >60 mL/min (ref >60)
GLUCOSE: 193 mg/dL — AB (ref 65–99)
POTASSIUM: 3.6 mmol/L (ref 3.5–5.1)
SODIUM: 134 mmol/L — AB (ref 135–145)
Total Bilirubin: 0.3 mg/dL (ref 0.3–1.2)
Total Protein: 7.1 g/dL (ref 6.5–8.1)

## 2016-12-14 MED ORDER — DEXAMETHASONE SODIUM PHOSPHATE 10 MG/ML IJ SOLN
10.0000 mg | Freq: Once | INTRAMUSCULAR | Status: AC
  Administered 2016-12-14: 10 mg via INTRAVENOUS
  Filled 2016-12-14: qty 1

## 2016-12-14 MED ORDER — HEPARIN SOD (PORK) LOCK FLUSH 100 UNIT/ML IV SOLN: 500.0000 [IU] | Freq: Once | INTRAVENOUS | Status: DC | PRN

## 2016-12-14 MED ORDER — SODIUM CHLORIDE 0.9 % IV SOLN
Freq: Once | INTRAVENOUS | Status: AC
  Administered 2016-12-14: 10:00:00 via INTRAVENOUS
  Filled 2016-12-14: qty 1000

## 2016-12-14 MED ORDER — LIDOCAINE-PRILOCAINE 2.5-2.5 % EX CREA: 1.0000 "application " | TOPICAL_CREAM | CUTANEOUS | 6 refills | Status: AC | PRN

## 2016-12-14 MED ORDER — SODIUM CHLORIDE 0.9% FLUSH
10.0000 mL | INTRAVENOUS | Status: DC | PRN
  Administered 2016-12-14: 10 mL
  Filled 2016-12-14: qty 10

## 2016-12-14 MED ORDER — SODIUM CHLORIDE 0.9% FLUSH
10.0000 mL | INTRAVENOUS | Status: DC | PRN
  Administered 2016-12-14: 10 mL via INTRAVENOUS
  Filled 2016-12-14: qty 10

## 2016-12-14 MED ORDER — SODIUM CHLORIDE 0.9 % IV SOLN
80.0000 mg/m2 | Freq: Once | INTRAVENOUS | Status: AC
  Administered 2016-12-14: 144 mg via INTRAVENOUS
  Filled 2016-12-14: qty 24

## 2016-12-14 MED ORDER — FAMOTIDINE IN NACL 20-0.9 MG/50ML-% IV SOLN
20.0000 mg | Freq: Once | INTRAVENOUS | Status: AC
  Administered 2016-12-14: 20 mg via INTRAVENOUS
  Filled 2016-12-14: qty 50

## 2016-12-14 MED ORDER — HEPARIN SOD (PORK) LOCK FLUSH 100 UNIT/ML IV SOLN
500.0000 [IU] | Freq: Once | INTRAVENOUS | Status: AC
  Administered 2016-12-14: 500 [IU] via INTRAVENOUS

## 2016-12-14 MED ORDER — SODIUM CHLORIDE 0.9 % IV SOLN: 10.0000 mg | Freq: Once | INTRAVENOUS | Status: DC

## 2016-12-14 MED ORDER — DIPHENHYDRAMINE HCL 50 MG/ML IJ SOLN
50.0000 mg | Freq: Once | INTRAMUSCULAR | Status: AC
  Administered 2016-12-14: 50 mg via INTRAVENOUS
  Filled 2016-12-14: qty 1

## 2016-12-14 NOTE — Progress Notes (Signed)
At 1015, pt with c/o "chest burning and pain" rated 8/10 and pointing to her upper chest in the middle near her throat.  I asked her if it felt like reflux and she said it did not.  BP noted to be 101/67 and HR 81.  Pt did not want to be seen by a doctor at this point in time.  Given benadryl, pepcid, and decadron as premeds for her chemo.  Pt was resting comfortably after the administration of these medications and verbalized that her pain was gone.  Throughout the administration of her taxol, pt was dozing off and on.  When awake, she complained of feeling a "restlessness in her bones that she has never experienced before".  Stated that she "had to walk around and wanted to sign papers to leave".  Encouraged patient to get up and walk around the infusion room.  She ambulated to the bathroom and returned to her chair and went back to sleep.  Pt tolerated taxol infusion well and left the chemo area stable and ambulatory.  Dr. Burlene Arnt aware of the above.

## 2016-12-15 ENCOUNTER — Encounter: Payer: Self-pay | Admitting: Internal Medicine

## 2016-12-20 ENCOUNTER — Inpatient Hospital Stay: Payer: Medicare Other

## 2016-12-20 ENCOUNTER — Inpatient Hospital Stay (HOSPITAL_BASED_OUTPATIENT_CLINIC_OR_DEPARTMENT_OTHER): Payer: Medicare Other | Admitting: Internal Medicine

## 2016-12-20 VITALS — BP 133/82 | HR 88 | Temp 97.8°F | Resp 20 | Ht 65.0 in | Wt 162.0 lb

## 2016-12-20 DIAGNOSIS — E119 Type 2 diabetes mellitus without complications: Secondary | ICD-10-CM

## 2016-12-20 DIAGNOSIS — C50811 Malignant neoplasm of overlapping sites of right female breast: Secondary | ICD-10-CM | POA: Diagnosis not present

## 2016-12-20 DIAGNOSIS — G4700 Insomnia, unspecified: Secondary | ICD-10-CM

## 2016-12-20 DIAGNOSIS — Z5111 Encounter for antineoplastic chemotherapy: Secondary | ICD-10-CM | POA: Diagnosis not present

## 2016-12-20 DIAGNOSIS — Z86718 Personal history of other venous thrombosis and embolism: Secondary | ICD-10-CM

## 2016-12-20 DIAGNOSIS — Z7982 Long term (current) use of aspirin: Secondary | ICD-10-CM

## 2016-12-20 DIAGNOSIS — F22 Delusional disorders: Secondary | ICD-10-CM

## 2016-12-20 DIAGNOSIS — Z9011 Acquired absence of right breast and nipple: Secondary | ICD-10-CM

## 2016-12-20 DIAGNOSIS — I252 Old myocardial infarction: Secondary | ICD-10-CM

## 2016-12-20 DIAGNOSIS — Z7901 Long term (current) use of anticoagulants: Secondary | ICD-10-CM

## 2016-12-20 DIAGNOSIS — C73 Malignant neoplasm of thyroid gland: Secondary | ICD-10-CM

## 2016-12-20 DIAGNOSIS — I2510 Atherosclerotic heart disease of native coronary artery without angina pectoris: Secondary | ICD-10-CM | POA: Diagnosis not present

## 2016-12-20 DIAGNOSIS — Z171 Estrogen receptor negative status [ER-]: Secondary | ICD-10-CM | POA: Diagnosis not present

## 2016-12-20 DIAGNOSIS — F17210 Nicotine dependence, cigarettes, uncomplicated: Secondary | ICD-10-CM | POA: Diagnosis not present

## 2016-12-20 DIAGNOSIS — G893 Neoplasm related pain (acute) (chronic): Secondary | ICD-10-CM

## 2016-12-20 DIAGNOSIS — G40909 Epilepsy, unspecified, not intractable, without status epilepticus: Secondary | ICD-10-CM

## 2016-12-20 DIAGNOSIS — Z79899 Other long term (current) drug therapy: Secondary | ICD-10-CM

## 2016-12-20 DIAGNOSIS — F419 Anxiety disorder, unspecified: Secondary | ICD-10-CM | POA: Diagnosis not present

## 2016-12-20 DIAGNOSIS — Z923 Personal history of irradiation: Secondary | ICD-10-CM

## 2016-12-20 DIAGNOSIS — Z9221 Personal history of antineoplastic chemotherapy: Secondary | ICD-10-CM

## 2016-12-20 DIAGNOSIS — Z85118 Personal history of other malignant neoplasm of bronchus and lung: Secondary | ICD-10-CM

## 2016-12-20 DIAGNOSIS — C787 Secondary malignant neoplasm of liver and intrahepatic bile duct: Secondary | ICD-10-CM

## 2016-12-20 DIAGNOSIS — Z7189 Other specified counseling: Secondary | ICD-10-CM

## 2016-12-20 DIAGNOSIS — E785 Hyperlipidemia, unspecified: Secondary | ICD-10-CM

## 2016-12-20 LAB — COMPREHENSIVE METABOLIC PANEL
ALT: 30 U/L (ref 14–54)
ANION GAP: 8 (ref 5–15)
AST: 27 U/L (ref 15–41)
Albumin: 3.3 g/dL — ABNORMAL LOW (ref 3.5–5.0)
Alkaline Phosphatase: 221 U/L — ABNORMAL HIGH (ref 38–126)
BUN: 32 mg/dL — ABNORMAL HIGH (ref 6–20)
CHLORIDE: 99 mmol/L — AB (ref 101–111)
CO2: 26 mmol/L (ref 22–32)
Calcium: 8.8 mg/dL — ABNORMAL LOW (ref 8.9–10.3)
Creatinine, Ser: 0.74 mg/dL (ref 0.44–1.00)
Glucose, Bld: 179 mg/dL — ABNORMAL HIGH (ref 65–99)
POTASSIUM: 3.5 mmol/L (ref 3.5–5.1)
SODIUM: 133 mmol/L — AB (ref 135–145)
Total Bilirubin: 0.3 mg/dL (ref 0.3–1.2)
Total Protein: 7.3 g/dL (ref 6.5–8.1)

## 2016-12-20 LAB — CBC WITH DIFFERENTIAL/PLATELET
Basophils Absolute: 0.1 10*3/uL (ref 0–0.1)
Basophils Relative: 2 %
EOS ABS: 0.1 10*3/uL (ref 0–0.7)
EOS PCT: 3 %
HCT: 38.5 % (ref 35.0–47.0)
Hemoglobin: 13.3 g/dL (ref 12.0–16.0)
LYMPHS ABS: 1.3 10*3/uL (ref 1.0–3.6)
LYMPHS PCT: 32 %
MCH: 29.4 pg (ref 26.0–34.0)
MCHC: 34.5 g/dL (ref 32.0–36.0)
MCV: 85.1 fL (ref 80.0–100.0)
MONO ABS: 0.2 10*3/uL (ref 0.2–0.9)
Monocytes Relative: 4 %
NEUTROS ABS: 2.4 10*3/uL (ref 1.4–6.5)
NEUTROS PCT: 59 %
PLATELETS: 137 10*3/uL — AB (ref 150–440)
RBC: 4.52 MIL/uL (ref 3.80–5.20)
RDW: 16.1 % — AB (ref 11.5–14.5)
WBC: 4.1 10*3/uL (ref 3.6–11.0)

## 2016-12-20 MED ORDER — DEXAMETHASONE SODIUM PHOSPHATE 10 MG/ML IJ SOLN
10.0000 mg | Freq: Once | INTRAMUSCULAR | Status: AC
  Administered 2016-12-20: 10 mg via INTRAVENOUS
  Filled 2016-12-20: qty 1

## 2016-12-20 MED ORDER — HEPARIN SOD (PORK) LOCK FLUSH 100 UNIT/ML IV SOLN: 500.0000 [IU] | Freq: Once | INTRAVENOUS | Status: DC | PRN

## 2016-12-20 MED ORDER — FAMOTIDINE IN NACL 20-0.9 MG/50ML-% IV SOLN
20.0000 mg | Freq: Once | INTRAVENOUS | Status: AC
  Administered 2016-12-20: 20 mg via INTRAVENOUS
  Filled 2016-12-20: qty 50

## 2016-12-20 MED ORDER — SODIUM CHLORIDE 0.9 % IV SOLN: 10.0000 mg | Freq: Once | INTRAVENOUS | Status: DC

## 2016-12-20 MED ORDER — APIXABAN 2.5 MG PO TABS: 2.5000 mg | ORAL_TABLET | Freq: Two times a day (BID) | ORAL | 6 refills | Status: DC

## 2016-12-20 MED ORDER — SODIUM CHLORIDE 0.9 % IV SOLN
Freq: Once | INTRAVENOUS | Status: AC
  Administered 2016-12-20: 10:00:00 via INTRAVENOUS
  Filled 2016-12-20: qty 1000

## 2016-12-20 MED ORDER — HEPARIN SOD (PORK) LOCK FLUSH 100 UNIT/ML IV SOLN
500.0000 [IU] | Freq: Once | INTRAVENOUS | Status: AC
  Administered 2016-12-20: 500 [IU] via INTRAVENOUS
  Filled 2016-12-20: qty 5

## 2016-12-20 MED ORDER — DIPHENHYDRAMINE HCL 50 MG/ML IJ SOLN
25.0000 mg | Freq: Once | INTRAMUSCULAR | Status: AC
  Administered 2016-12-20: 25 mg via INTRAVENOUS
  Filled 2016-12-20: qty 1

## 2016-12-20 MED ORDER — PACLITAXEL CHEMO INJECTION 300 MG/50ML
80.0000 mg/m2 | Freq: Once | INTRAVENOUS | Status: AC
  Administered 2016-12-20: 144 mg via INTRAVENOUS
  Filled 2016-12-20: qty 24

## 2016-12-20 MED ORDER — SODIUM CHLORIDE 0.9% FLUSH
10.0000 mL | Freq: Once | INTRAVENOUS | Status: AC
  Administered 2016-12-20: 10 mL via INTRAVENOUS
  Filled 2016-12-20: qty 10

## 2016-12-20 NOTE — Assessment & Plan Note (Addendum)
#   Recurrent/metastatic breast cancer- biopsy-proven liver lesion. TRIPLE NEGATIVE.  Currently on palliative chemo on -Taxol weekly s/p cycle #1.   # proceed with cycle # 2; Labs today reviewed;  acceptable for treatment today. Again discussed the palliative nature of the treatments.  # Chronic pain/and pain from breast surgery- continue hydrocodone1 every 8 hours as needed. New prescription given.  # anxiety/ insomnia- started on valium 5 mg BID when necessary- recently. No new prescriptions given.  # DVT bil- on Elquis; continue the same; new script given.   # Paranoia/psychiatric illness- recommend psychiatric evaluation; after lengthy discussion declines.  Patient does not want me to address this issue again.  #  chemotherapy in 1 week/labs; follow-up with me in 2 weeks/labs.

## 2016-12-20 NOTE — Progress Notes (Signed)
[facility] OFFICE PROGRESS NOTE  Patient Care Team: Remi Haggard, FNP as PCP - General (Family Medicine)   SUMMARY OF ONCOLOGIC HISTORY:  Oncology History   # JAN 2017- Right Breast IDC;STAGE II T3 [5x7cm] N0 [right Ax Ln Bx- neg]; ER/PR/Her 2 NEG; carbo-Taxol w x10; May 5th [place]- Improving breast mass; OCT 5th- Lumpec & SLNBx [Dr.Loflin]- ypT3 [31m] ypN0 (sn) s/p RT.    # March 2018- PET liver lesion; April 2018- Liver bx- RECURRENT Breast cancer; ER/PR/Her-2 Neu-Pending.  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   # Smoker; ? Paranoia; Feb 2017- MUGA scan- 67%  # Molecular testing- MMR ordered- April 2018.      Carcinoma of overlapping sites of right breast in female, estrogen receptor negative ([facility]   INTERVAL HISTORY: Poor- vague historian- secondary to psychiatric illness.   89ear old female patient with above History of triple negative recurrent/ metastatic breast cancer to the liver currently on palliative chemotherapy with Taxol single agent is here for follow-up.  Patient cycle #1 was delayed because of transportation/logistical issues. Patient complains of difficulty sleeping at nighttime. She has been prescribed Valium. Otherwise patient denies any unusual shortness of breath or chest pain or cough. Denies any tingling or numbness.   REVIEW OF SYSTEMS: Difficult to assess given as patient is a poor historian.  PAST MEDICAL HISTORY :  Past Medical History:  Diagnosis Date  . Anxiety   . Breast cancer ([facility] [place]1/2017   right breast, chemo  . Cancer of right female breast ([facility]  . Coronary artery disease    a. NSTEMI cath 08/02/14: LM nl, pLAD 50%, mLCx 30%, mRCA 95% s/p PCI/DES, 2nd lesion 40%, EF 55%  . Diabetes  mellitus without complication ([facility]  . DVT (deep venous thrombosis) (HCC)    LEFT LEG   . HLD (hyperlipidemia)   . Hypertension   . Lung cancer ([facility]  . Lung nodule   . MI (myocardial infarction) ([facility] [place]  . Seizure disorder (HCP[facility]  . Seizures (HC[facility]  . Squamous cell lung cancer (HCLeota8/31/2016    PAST SURGICAL HISTORY :   Past Surgical History:  Procedure Laterality Date  . ABDOMINAL HYSTERECTOMY     PARTIAL   . AXILLARY LYMPH NODE BIOPSY Right 05/26/2016   Procedure: AXILLARY LYMPH NODE BIOPSY;  Surgeon: CaHubbard RobinsonMD;  Location: [facility] ORS;  Service: General;  Laterality: Right;  . BREAST BIOPSY Right 09/15/2015   positve  . CARDIAC CATHETERIZATION  08/02/2014  . CORONARY ANGIOPLASTY  08/02/2014   drug eluting stent placement  . IVC FILTER PLACEMENT ([facility] HX)    . LEG SURGERY Right    BLOOD CLOT  . MASTECTOMY, PARTIAL Right 05/26/2016   Procedure: MASTECTOMY PARTIAL;  Surgeon: CaHubbard RobinsonMD;  Location: [facility] ORS;  Service: General;  Laterality: Right;  . PORTACATH PLACEMENT Left 10/13/2015   Procedure: INSERTION PORT-A-CATH;  Surgeon: CaHubbard RobinsonMD;  Location: [facility] ORS;  Service: General;  Laterality: Left;  . SENTINEL NODE BIOPSY Right 05/26/2016   Procedure: SENTINEL NODE BIOPSY;  Surgeon: CaHubbard RobinsonMD;  Location: [facility] ORS;  Service: General;  Laterality: Right;    FAMILY HISTORY :  Family History  Problem Relation Age of Onset  . Heart attack Mother   . Breast cancer Sister 64    bilaterally breast cancer    SOCIAL HISTORY:   Social History  Substance Use Topics  . Smoking status: Current Every Day Smoker    Packs/day: 1.00    Years: 45.00    Types: Cigarettes  . Smokeless tobacco: Never Used  . Alcohol use No    ALLERGIES:  is allergic to penicillins.  MEDICATIONS:  Current Outpatient Prescriptions  Medication Sig Dispense Refill  . apixaban (ELIQUIS) 2.5 MG TABS tablet Take 1 tablet (2.5 mg total) by mouth 2 (two) times  daily. Reported on 01/01/2016 60 tablet 6  . Aspirin-Salicylamide-Caffeine (BC HEADACHE POWDER PO) Take 1 packet by mouth daily.    Marland Kitchen atorvastatin (LIPITOR) 40 MG tablet Take 1 tablet by mouth daily.    . diazepam (VALIUM) 5 MG tablet Take 1 tablet (5 mg total) by mouth 2 (two) times daily as needed. Reported on 12/16/2015 60 tablet 0  . empagliflozin (JARDIANCE) 25 MG TABS tablet Take 25 mg by mouth daily.    Marland Kitchen HYDROcodone-acetaminophen (NORCO/VICODIN) 5-325 MG tablet Take 1 tablet by mouth every 8 (eight) hours as needed for moderate pain. 40 tablet 0  . lidocaine-prilocaine (EMLA) cream Apply 1 application topically as needed. Apply to port a cath site 1 hour before chemotherapy treatment. 30 g 6  . lisinopril-hydrochlorothiazide (PRINZIDE,ZESTORETIC) 20-25 MG per tablet Take 1 tablet by mouth daily.     . ondansetron (ZOFRAN ODT) 4 MG disintegrating tablet Take 1 tablet (4 mg total) by mouth every 8 (eight) hours as needed for nausea or vomiting. 20 tablet 0  . phenytoin (DILANTIN) 100 MG ER capsule Take 300 mg by mouth daily.     . pioglitazone (ACTOS) 45 MG tablet Take 1 tablet by mouth daily.    . VENTOLIN HFA 108 (90 Base) MCG/ACT inhaler Inhale 1 puff into the lungs every 6 (six) hours as needed. Reported on 01/20/2016    . polyethylene glycol powder (MIRALAX) powder Take 255 g by mouth once. Take as directed 255 g 0   Current Facility-Administered Medications  Medication Dose Route Frequency Provider Last Rate Last Dose  . heparin lock flush 100 unit/mL  500 Units Intracatheter Once PRN Cammie Sickle, MD       Facility-Administered Medications Ordered in Other Visits  Medication Dose Route Frequency Provider Last Rate Last Dose  . albuterol (PROVENTIL) (2.5 MG/3ML) 0.083% nebulizer solution 2.5 mg  2.5 mg Nebulization Once Nestor Lewandowsky, MD      . sodium chloride flush (NS) 0.9 % injection 10 mL  10 mL Intravenous PRN Cammie Sickle, MD   10 mL at 12/01/15 0850    PHYSICAL  EXAMINATION: ECOG PERFORMANCE STATUS: 0 - Asymptomatic  BP 133/82 (BP Location: Left Arm, Patient Position: Sitting)   Pulse 88   Temp 97.8 F (36.6 C) (Tympanic)   Resp 20   Ht '5\' 5"'  (1.651 m)   Wt 162 lb (73.5 kg)   BMI 26.96 kg/m   Filed Weights   12/20/16 0935  Weight: 162 lb (73.5 kg)    GENERAL: Well-nourished well-developed; Alert, no distress and comfortable.She is alone. EYES: no pallor or icterus OROPHARYNX: no thrush or ulceration.   NECK: supple, no masses felt LYMPH: no palpable lymphadenopathy in the cervical, axillary or inguinal regions LUNGS: clear to auscultation and No wheeze or crackles HEART/CVS: regular rate & rhythm and no murmurs; No  lower extremity edema ABDOMEN:abdomen soft, non-tender and normal bowel sounds; site of liver biopsy well-healed. No infection. Musculoskeletal:no cyanosis of digits and no clubbing  PSYCH: alert & oriented x 3; anxious.  NEURO: no focal motor/sensory deficits Skin- normal limits.  LABORATORY DATA:  I have reviewed the data as listed    Component Value Date/Time   NA 133 (L) 12/20/2016 0910   NA 141 08/03/2014 0050   K 3.5 12/20/2016 0910   K 3.3 (L) 08/03/2014 0050   CL 99 (L) 12/20/2016 0910   CL 108 (H) 08/03/2014 0050   CO2 26 12/20/2016 0910   CO2 27 08/03/2014 0050   GLUCOSE 179 (H) 12/20/2016 0910   GLUCOSE 164 (H) 08/03/2014 0050   BUN 32 (H) 12/20/2016 0910   BUN 14 08/03/2014 0050   CREATININE 0.74 12/20/2016 0910   CREATININE 0.68 08/03/2014 0050   CALCIUM 8.8 (L) 12/20/2016 0910   CALCIUM 8.2 (L) 08/03/2014 0050   PROT 7.3 12/20/2016 0910   PROT 6.7 04/11/2013 2241   ALBUMIN 3.3 (L) 12/20/2016 0910   ALBUMIN 2.7 (L) 04/11/2013 2241   AST 27 12/20/2016 0910   AST 13 (L) 04/11/2013 2241   ALT 30 12/20/2016 0910   ALT 16 04/11/2013 2241   ALKPHOS 221 (H) 12/20/2016 0910   ALKPHOS 207 (H) 04/11/2013 2241   BILITOT 0.3 12/20/2016 0910   BILITOT 0.1 (L) 04/11/2013 2241   GFRNONAA >60  12/20/2016 0910   GFRNONAA >60 08/03/2014 0050   GFRNONAA >60 04/11/2013 2241   GFRAA >60 12/20/2016 0910   GFRAA >60 08/03/2014 0050   GFRAA >60 04/11/2013 2241    No results found for: SPEP, UPEP  Lab Results  Component Value Date   WBC 4.1 12/20/2016   NEUTROABS 2.4 12/20/2016   HGB 13.3 12/20/2016   HCT 38.5 12/20/2016   MCV 85.1 12/20/2016   PLT 137 (L) 12/20/2016      Chemistry      Component Value Date/Time   NA 133 (L) 12/20/2016 0910   NA 141 08/03/2014 0050   K 3.5 12/20/2016 0910   K 3.3 (L) 08/03/2014 0050   CL 99 (L) 12/20/2016 0910   CL 108 (H) 08/03/2014 0050   CO2 26 12/20/2016 0910   CO2 27 08/03/2014 0050   BUN 32 (H) 12/20/2016 0910   BUN 14 08/03/2014 0050   CREATININE 0.74 12/20/2016 0910   CREATININE 0.68 08/03/2014 0050      Component Value Date/Time   CALCIUM 8.8 (L) 12/20/2016 0910   CALCIUM 8.2 (L) 08/03/2014 0050   ALKPHOS 221 (H) 12/20/2016 0910   ALKPHOS 207 (H) 04/11/2013 2241   AST 27 12/20/2016 0910   AST 13 (L) 04/11/2013 2241   ALT 30 12/20/2016 0910   ALT 16 04/11/2013 2241   BILITOT 0.3 12/20/2016 0910   BILITOT 0.1 (L) 04/11/2013 2241        ASSESSMENT & PLAN:   Carcinoma of overlapping sites of right breast in female, estrogen receptor negative ([place]) # Recurrent/metastatic breast cancer- biopsy-proven liver lesion. TRIPLE NEGATIVE.  Currently on palliative chemo on -Taxol weekly s/p cycle #1.   # proceed with cycle # 2; Labs today reviewed;  acceptable for treatment today. Again discussed the palliative nature of the treatments.  # Chronic pain/and pain from breast surgery- continue hydrocodone1 every 8 hours as needed. New prescription given.  # anxiety/ insomnia- started on valium 5 mg BID when necessary- recently. No new prescriptions given.  # DVT bil- on Elquis;  continue the same; new script given.   # Paranoia/psychiatric illness- recommend psychiatric evaluation; after lengthy discussion declines.  Patient  does not want me to address this issue again.  #  chemotherapy in 1 week/labs; follow-up with me in 2 weeks/labs.     Cammie Sickle, MD 12/21/2016 8:37 AM

## 2016-12-20 NOTE — Progress Notes (Signed)
Patient here for lung cancer follow-up. She reports intermittent insomnia.  She also request RF on eliquis.

## 2016-12-21 ENCOUNTER — Encounter: Payer: Self-pay | Admitting: *Deleted

## 2016-12-21 NOTE — Progress Notes (Signed)
  Oncology Nurse Navigator Documentation  Navigator Location: [facility] (12/21/16 0900)   )Navigator Encounter Type: Telephone (12/21/16 0900) Telephone: Lahoma Crocker Call (12/21/16 0900)    Called patient to follow-up post treatment yesterday.  Left her a message to return my call.                                              Time Spent with Patient: 15 (12/21/16 0900)

## 2016-12-27 ENCOUNTER — Inpatient Hospital Stay: Payer: Medicare Other | Attending: Internal Medicine

## 2016-12-27 ENCOUNTER — Inpatient Hospital Stay: Payer: Medicare Other

## 2016-12-27 ENCOUNTER — Other Ambulatory Visit: Payer: Self-pay | Admitting: *Deleted

## 2016-12-27 VITALS — BP 136/80 | HR 73 | Temp 97.9°F | Resp 20

## 2016-12-27 DIAGNOSIS — R7989 Other specified abnormal findings of blood chemistry: Secondary | ICD-10-CM | POA: Insufficient documentation

## 2016-12-27 DIAGNOSIS — C50811 Malignant neoplasm of overlapping sites of right female breast: Secondary | ICD-10-CM

## 2016-12-27 DIAGNOSIS — E119 Type 2 diabetes mellitus without complications: Secondary | ICD-10-CM | POA: Insufficient documentation

## 2016-12-27 DIAGNOSIS — Z85118 Personal history of other malignant neoplasm of bronchus and lung: Secondary | ICD-10-CM | POA: Insufficient documentation

## 2016-12-27 DIAGNOSIS — E785 Hyperlipidemia, unspecified: Secondary | ICD-10-CM | POA: Diagnosis not present

## 2016-12-27 DIAGNOSIS — G40909 Epilepsy, unspecified, not intractable, without status epilepticus: Secondary | ICD-10-CM | POA: Insufficient documentation

## 2016-12-27 DIAGNOSIS — I2510 Atherosclerotic heart disease of native coronary artery without angina pectoris: Secondary | ICD-10-CM | POA: Insufficient documentation

## 2016-12-27 DIAGNOSIS — F419 Anxiety disorder, unspecified: Secondary | ICD-10-CM | POA: Diagnosis not present

## 2016-12-27 DIAGNOSIS — Z85850 Personal history of malignant neoplasm of thyroid: Secondary | ICD-10-CM | POA: Diagnosis not present

## 2016-12-27 DIAGNOSIS — G8929 Other chronic pain: Secondary | ICD-10-CM | POA: Diagnosis not present

## 2016-12-27 DIAGNOSIS — C787 Secondary malignant neoplasm of liver and intrahepatic bile duct: Secondary | ICD-10-CM | POA: Insufficient documentation

## 2016-12-27 DIAGNOSIS — Z7901 Long term (current) use of anticoagulants: Secondary | ICD-10-CM | POA: Insufficient documentation

## 2016-12-27 DIAGNOSIS — Z171 Estrogen receptor negative status [ER-]: Principal | ICD-10-CM

## 2016-12-27 DIAGNOSIS — F17210 Nicotine dependence, cigarettes, uncomplicated: Secondary | ICD-10-CM | POA: Insufficient documentation

## 2016-12-27 DIAGNOSIS — I10 Essential (primary) hypertension: Secondary | ICD-10-CM | POA: Diagnosis not present

## 2016-12-27 DIAGNOSIS — I252 Old myocardial infarction: Secondary | ICD-10-CM | POA: Insufficient documentation

## 2016-12-27 DIAGNOSIS — Z803 Family history of malignant neoplasm of breast: Secondary | ICD-10-CM | POA: Insufficient documentation

## 2016-12-27 DIAGNOSIS — Z86718 Personal history of other venous thrombosis and embolism: Secondary | ICD-10-CM | POA: Diagnosis not present

## 2016-12-27 DIAGNOSIS — F22 Delusional disorders: Secondary | ICD-10-CM | POA: Diagnosis not present

## 2016-12-27 DIAGNOSIS — Z5111 Encounter for antineoplastic chemotherapy: Secondary | ICD-10-CM | POA: Diagnosis present

## 2016-12-27 LAB — CBC
HEMATOCRIT: 34.3 % — AB (ref 35.0–47.0)
HEMOGLOBIN: 11.8 g/dL — AB (ref 12.0–16.0)
MCH: 29.1 pg (ref 26.0–34.0)
MCHC: 34.3 g/dL (ref 32.0–36.0)
MCV: 84.9 fL (ref 80.0–100.0)
Platelets: 144 10*3/uL — ABNORMAL LOW (ref 150–440)
RBC: 4.04 MIL/uL (ref 3.80–5.20)
RDW: 16.8 % — ABNORMAL HIGH (ref 11.5–14.5)
WBC: 3.4 10*3/uL — ABNORMAL LOW (ref 3.6–11.0)

## 2016-12-27 LAB — DIFFERENTIAL
BASOS ABS: 0 10*3/uL (ref 0–0.1)
Basophils Relative: 1 %
EOS ABS: 0.1 10*3/uL (ref 0–0.7)
Eosinophils Relative: 4 %
LYMPHS ABS: 1.3 10*3/uL (ref 1.0–3.6)
Lymphocytes Relative: 38 %
MONOS PCT: 7 %
Monocytes Absolute: 0.2 10*3/uL (ref 0.2–0.9)
NEUTROS ABS: 1.6 10*3/uL (ref 1.4–6.5)
Neutrophils Relative %: 50 %

## 2016-12-27 MED ORDER — SODIUM CHLORIDE 0.9% FLUSH
10.0000 mL | INTRAVENOUS | Status: DC | PRN
  Administered 2016-12-27 (×2): 10 mL
  Filled 2016-12-27: qty 10

## 2016-12-27 MED ORDER — FAMOTIDINE IN NACL 20-0.9 MG/50ML-% IV SOLN
20.0000 mg | Freq: Once | INTRAVENOUS | Status: AC
  Administered 2016-12-27: 20 mg via INTRAVENOUS
  Filled 2016-12-27: qty 50

## 2016-12-27 MED ORDER — SODIUM CHLORIDE 0.9 % IV SOLN
Freq: Once | INTRAVENOUS | Status: AC
  Administered 2016-12-27: 11:00:00 via INTRAVENOUS
  Filled 2016-12-27: qty 1000

## 2016-12-27 MED ORDER — HEPARIN SOD (PORK) LOCK FLUSH 100 UNIT/ML IV SOLN
500.0000 [IU] | Freq: Once | INTRAVENOUS | Status: AC | PRN
  Administered 2016-12-27: 500 [IU]
  Filled 2016-12-27 (×2): qty 5

## 2016-12-27 MED ORDER — DEXAMETHASONE SODIUM PHOSPHATE 10 MG/ML IJ SOLN
10.0000 mg | Freq: Once | INTRAMUSCULAR | Status: AC
  Administered 2016-12-27: 10 mg via INTRAVENOUS
  Filled 2016-12-27: qty 1

## 2016-12-27 MED ORDER — PACLITAXEL CHEMO INJECTION 300 MG/50ML
80.0000 mg/m2 | Freq: Once | INTRAVENOUS | Status: AC
  Administered 2016-12-27: 144 mg via INTRAVENOUS
  Filled 2016-12-27: qty 24

## 2016-12-27 MED ORDER — SODIUM CHLORIDE 0.9 % IV SOLN: 10.0000 mg | Freq: Once | INTRAVENOUS | Status: DC

## 2016-12-27 MED ORDER — DIPHENHYDRAMINE HCL 50 MG/ML IJ SOLN
25.0000 mg | Freq: Once | INTRAMUSCULAR | Status: AC
  Administered 2016-12-27: 25 mg via INTRAVENOUS
  Filled 2016-12-27: qty 1

## 2017-01-03 ENCOUNTER — Inpatient Hospital Stay: Payer: Medicare Other

## 2017-01-03 ENCOUNTER — Inpatient Hospital Stay (HOSPITAL_BASED_OUTPATIENT_CLINIC_OR_DEPARTMENT_OTHER): Payer: Medicare Other | Admitting: Internal Medicine

## 2017-01-03 ENCOUNTER — Ambulatory Visit: Admission: RE | Admit: 2017-01-03 | Payer: Medicare Other | Source: Ambulatory Visit

## 2017-01-03 VITALS — BP 123/80 | HR 80 | Temp 97.6°F | Resp 20 | Ht 65.0 in | Wt 162.0 lb

## 2017-01-03 DIAGNOSIS — C787 Secondary malignant neoplasm of liver and intrahepatic bile duct: Secondary | ICD-10-CM | POA: Diagnosis not present

## 2017-01-03 DIAGNOSIS — I2510 Atherosclerotic heart disease of native coronary artery without angina pectoris: Secondary | ICD-10-CM

## 2017-01-03 DIAGNOSIS — Z803 Family history of malignant neoplasm of breast: Secondary | ICD-10-CM

## 2017-01-03 DIAGNOSIS — E785 Hyperlipidemia, unspecified: Secondary | ICD-10-CM | POA: Diagnosis not present

## 2017-01-03 DIAGNOSIS — Z171 Estrogen receptor negative status [ER-]: Secondary | ICD-10-CM

## 2017-01-03 DIAGNOSIS — Z86718 Personal history of other venous thrombosis and embolism: Secondary | ICD-10-CM | POA: Diagnosis not present

## 2017-01-03 DIAGNOSIS — Z85118 Personal history of other malignant neoplasm of bronchus and lung: Secondary | ICD-10-CM

## 2017-01-03 DIAGNOSIS — C50811 Malignant neoplasm of overlapping sites of right female breast: Secondary | ICD-10-CM

## 2017-01-03 DIAGNOSIS — E119 Type 2 diabetes mellitus without complications: Secondary | ICD-10-CM

## 2017-01-03 DIAGNOSIS — Z85850 Personal history of malignant neoplasm of thyroid: Secondary | ICD-10-CM

## 2017-01-03 DIAGNOSIS — F419 Anxiety disorder, unspecified: Secondary | ICD-10-CM | POA: Diagnosis not present

## 2017-01-03 DIAGNOSIS — F17210 Nicotine dependence, cigarettes, uncomplicated: Secondary | ICD-10-CM | POA: Diagnosis not present

## 2017-01-03 DIAGNOSIS — R7989 Other specified abnormal findings of blood chemistry: Secondary | ICD-10-CM | POA: Diagnosis not present

## 2017-01-03 DIAGNOSIS — I252 Old myocardial infarction: Secondary | ICD-10-CM

## 2017-01-03 DIAGNOSIS — I10 Essential (primary) hypertension: Secondary | ICD-10-CM

## 2017-01-03 DIAGNOSIS — F22 Delusional disorders: Secondary | ICD-10-CM

## 2017-01-03 DIAGNOSIS — G40909 Epilepsy, unspecified, not intractable, without status epilepticus: Secondary | ICD-10-CM

## 2017-01-03 DIAGNOSIS — G8929 Other chronic pain: Secondary | ICD-10-CM | POA: Diagnosis not present

## 2017-01-03 DIAGNOSIS — Z95828 Presence of other vascular implants and grafts: Secondary | ICD-10-CM

## 2017-01-03 DIAGNOSIS — Z5111 Encounter for antineoplastic chemotherapy: Secondary | ICD-10-CM | POA: Diagnosis not present

## 2017-01-03 LAB — COMPREHENSIVE METABOLIC PANEL
ALT: 70 U/L — AB (ref 14–54)
ANION GAP: 8 (ref 5–15)
AST: 91 U/L — ABNORMAL HIGH (ref 15–41)
Albumin: 3.4 g/dL — ABNORMAL LOW (ref 3.5–5.0)
Alkaline Phosphatase: 256 U/L — ABNORMAL HIGH (ref 38–126)
BUN: 41 mg/dL — ABNORMAL HIGH (ref 6–20)
CHLORIDE: 101 mmol/L (ref 101–111)
CO2: 28 mmol/L (ref 22–32)
Calcium: 9.1 mg/dL (ref 8.9–10.3)
Creatinine, Ser: 1 mg/dL (ref 0.44–1.00)
GFR calc non Af Amer: 58 mL/min — ABNORMAL LOW (ref >60)
Glucose, Bld: 187 mg/dL — ABNORMAL HIGH (ref 65–99)
POTASSIUM: 3.9 mmol/L (ref 3.5–5.1)
SODIUM: 137 mmol/L (ref 135–145)
Total Bilirubin: 0.3 mg/dL (ref 0.3–1.2)
Total Protein: 7.3 g/dL (ref 6.5–8.1)

## 2017-01-03 LAB — CBC WITH DIFFERENTIAL/PLATELET
Basophils Absolute: 0 10*3/uL (ref 0–0.1)
Basophils Relative: 1 %
Eosinophils Absolute: 0.1 10*3/uL (ref 0–0.7)
Eosinophils Relative: 2 %
HEMATOCRIT: 34.7 % — AB (ref 35.0–47.0)
HEMOGLOBIN: 12 g/dL (ref 12.0–16.0)
LYMPHS ABS: 1.1 10*3/uL (ref 1.0–3.6)
LYMPHS PCT: 30 %
MCH: 29.4 pg (ref 26.0–34.0)
MCHC: 34.6 g/dL (ref 32.0–36.0)
MCV: 85 fL (ref 80.0–100.0)
MONOS PCT: 13 %
Monocytes Absolute: 0.5 10*3/uL (ref 0.2–0.9)
NEUTROS ABS: 2 10*3/uL (ref 1.4–6.5)
NEUTROS PCT: 54 %
Platelets: 172 10*3/uL (ref 150–440)
RBC: 4.08 MIL/uL (ref 3.80–5.20)
RDW: 17.1 % — ABNORMAL HIGH (ref 11.5–14.5)
WBC: 3.8 10*3/uL (ref 3.6–11.0)

## 2017-01-03 MED ORDER — PACLITAXEL CHEMO INJECTION 300 MG/50ML
80.0000 mg/m2 | Freq: Once | INTRAVENOUS | Status: AC
  Administered 2017-01-03: 144 mg via INTRAVENOUS
  Filled 2017-01-03: qty 24

## 2017-01-03 MED ORDER — SODIUM CHLORIDE 0.9 % IV SOLN
Freq: Once | INTRAVENOUS | Status: AC
  Administered 2017-01-03: 11:00:00 via INTRAVENOUS
  Filled 2017-01-03: qty 1000

## 2017-01-03 MED ORDER — FAMOTIDINE IN NACL 20-0.9 MG/50ML-% IV SOLN
20.0000 mg | Freq: Once | INTRAVENOUS | Status: AC
  Administered 2017-01-03: 20 mg via INTRAVENOUS
  Filled 2017-01-03: qty 50

## 2017-01-03 MED ORDER — DEXAMETHASONE SODIUM PHOSPHATE 10 MG/ML IJ SOLN
10.0000 mg | Freq: Once | INTRAMUSCULAR | Status: AC
  Administered 2017-01-03: 10 mg via INTRAVENOUS
  Filled 2017-01-03: qty 1

## 2017-01-03 MED ORDER — DIPHENHYDRAMINE HCL 50 MG/ML IJ SOLN
25.0000 mg | Freq: Once | INTRAMUSCULAR | Status: AC
  Administered 2017-01-03: 25 mg via INTRAVENOUS
  Filled 2017-01-03: qty 1

## 2017-01-03 MED ORDER — HEPARIN SOD (PORK) LOCK FLUSH 100 UNIT/ML IV SOLN: 500.0000 [IU] | Freq: Once | INTRAVENOUS | Status: DC

## 2017-01-03 MED ORDER — SODIUM CHLORIDE 0.9% FLUSH
10.0000 mL | INTRAVENOUS | Status: DC | PRN
  Administered 2017-01-03: 10 mL via INTRAVENOUS
  Filled 2017-01-03: qty 10

## 2017-01-03 MED ORDER — HYDROCODONE-ACETAMINOPHEN 5-325 MG PO TABS: 1.0000 | ORAL_TABLET | Freq: Three times a day (TID) | ORAL | 0 refills | Status: DC | PRN

## 2017-01-03 NOTE — Patient Instructions (Signed)
Your liver function labs are elevated. Please stop your cholesterol medication - atorvastatin (LIPITOR) 40 MG tablet.

## 2017-01-03 NOTE — Progress Notes (Signed)
Port accessed to draw labs, patient complained of the port feeling sore for "several weeks and radiates down chest." She stated that it was more painful when touched.  Swelling noted when saline flushed through port and no blood return.  RN left port in place and called [name], dye study ordered.  Chemo given through a peripheral vein. Patient completed chemo but refused to stay for dye study that was scheduled 45 minutes later.  Informed patient that we could not use her port for labs or treatment until the study was completed. She verbalized understanding and stated that she "will get it done sometime, but not today."  [name] notified.

## 2017-01-03 NOTE — Assessment & Plan Note (Addendum)
#   Recurrent/metastatic breast cancer- biopsy-proven liver lesion. TRIPLE NEGATIVE.  Currently on palliative chemo on -Taxol weekly s/p cycle #1 [3 weekly treatments]  # proceed with cycle # 2 day-1 Labs today reviewed;  acceptable for treatment today- except for slightly elevated LFTs [C discussion below]  # Chronic pain/and pain from breast surgery- continue hydrocodone1 every 8 hours as needed. New prescription given.  # PORT malfunction- will plan to get dye study.   # LFTs- elevated G-1; ? Taxol- monitor for now. HOLD lipitor.   # anxiety/ insomnia- on valium 5 mg BID when necessary.   # DVT bil- on Elquis; continue the same.   # Paranoia/psychiatric illness- declines evaluation.   #  Chemotherapy in 1 week/labs; follow-up with me in 2 weeks/lab [[place]].

## 2017-01-03 NOTE — Progress Notes (Signed)
Patient reports that her port did not flush today. No blood return per patient. Patient requesting dye study. She states that she would be willing to have the dye study today, but unavailable tomorrow for the procedure.  Pt reports that her air condition is not working adequately at home. Msg sent to social worker to see if a box fax could be purchased for patient.

## 2017-01-03 NOTE — Progress Notes (Signed)
[facility] OFFICE PROGRESS NOTE  Patient Care Team: Remi Haggard, FNP as PCP - General (Family Medicine)   SUMMARY OF ONCOLOGIC HISTORY:  Oncology History   # JAN 2017- Right Breast IDC;STAGE II T3 [5x7cm] N0 [right Ax Ln Bx- neg]; ER/PR/Her 2 NEG; carbo-Taxol w x10; May 5th [place]- Improving breast mass; OCT 5th- Lumpec & SLNBx [Dr.Loflin]- ypT3 [72m] ypN0 (sn) s/p RT.    # March 2018- PET liver lesion; April 2018- Liver bx- RECURRENT Breast cancer; ER/PR/Her-2 Neu-Pending.  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   # Smoker; ? Paranoia; Feb 2017- MUGA scan- 67%  # Molecular testing- MMR ordered- April 2018.      Carcinoma of overlapping sites of right breast in female, estrogen receptor negative ([place]   INTERVAL HISTORY: Poor- vague historian- secondary to psychiatric illness.   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 Patient has so for 3 weekly treatments.  Denies any nausea vomiting. Denies any tingling or numbness. Complains of port malfunction pain. Difficulty in drawing blood/flushing.   Otherwise patient denies any unusual shortness of breath or chest pain or cough. Denies any tingling or numbness.   REVIEW OF SYSTEMS: Difficult to assess given as patient is a poor historian.  PAST MEDICAL HISTORY :  Past Medical History:  Diagnosis Date  . Anxiety   . Breast cancer (HC[facility]1/2017   right breast, chemo  . Cancer of right female breast (HCM[facility]  . Coronary artery disease    a. NSTEMI cath 08/02/14: LM nl, pLAD 50%, mLCx 30%, mRCA 95% s/p PCI/DES, 2nd lesion 40%, EF  55%  . Diabetes mellitus without complication (HCK[facility]  . DVT (deep venous thrombosis) (HCC)    LEFT LEG   . HLD (hyperlipidemia)   . Hypertension   . Lung cancer (H[facility]  . Lung nodule   . MI (myocardial infarction) (HCGass[place]  . Seizure disorder ([facility]  . Seizures (HC[place]  . Squamous cell lung cancer (HCPort[place]8/31/2016    PAST SURGICAL HISTORY :   Past Surgical History:  Procedure Laterality Date  . ABDOMINAL HYSTERECTOMY     PARTIAL   . AXILLARY LYMPH NODE BIOPSY Right 05/26/2016   Procedure: AXILLARY LYMPH NODE BIOPSY;  Surgeon: CaHubbard RobinsonMD;  Location: [facility] ORS;  Service: General;  Laterality: Right;  . BREAST BIOPSY Right 09/15/2015   positve  . CARDIAC CATHETERIZATION  08/02/2014  . CORONARY ANGIOPLASTY  08/02/2014   drug eluting stent placement  . IVC FILTER PLACEMENT ([facility] HX)    . LEG SURGERY Right    BLOOD CLOT  . MASTECTOMY, PARTIAL Right 05/26/2016   Procedure: MASTECTOMY PARTIAL;  Surgeon: CaHubbard RobinsonMD;  Location: [facility] ORS;  Service: General;  Laterality: Right;  . PORTACATH PLACEMENT Left 10/13/2015   Procedure: INSERTION PORT-A-CATH;  Surgeon: CaHubbard RobinsonMD;  Location: [facility] ORS;  Service: General;  Laterality: Left;  . SENTINEL NODE BIOPSY Right 05/26/2016   Procedure: SENTINEL NODE BIOPSY;  Surgeon: CaHubbard RobinsonMD;  Location: [facility] ORS;  Service: General;  Laterality: Right;  FAMILY HISTORY :   Family History  Problem Relation Age of Onset  . Heart attack Mother   . Breast cancer Sister 70       bilaterally breast cancer    SOCIAL HISTORY:   Social History  Substance Use Topics  . Smoking status: Current Every Day Smoker    Packs/day: 1.00    Years: 45.00    Types: Cigarettes  . Smokeless tobacco: Never Used  . Alcohol use No    ALLERGIES:  is allergic to penicillins.  MEDICATIONS:  Current Outpatient Prescriptions  Medication Sig Dispense Refill  . apixaban (ELIQUIS) 2.5 MG TABS tablet Take 1 tablet (2.5 mg total) by  mouth 2 (two) times daily. Reported on 01/01/2016 60 tablet 6  . Aspirin-Salicylamide-Caffeine (BC HEADACHE POWDER PO) Take 1 packet by mouth daily.    Marland Kitchen atorvastatin (LIPITOR) 40 MG tablet Take 1 tablet by mouth daily.    . diazepam (VALIUM) 5 MG tablet Take 1 tablet (5 mg total) by mouth 2 (two) times daily as needed. Reported on 12/16/2015 60 tablet 0  . empagliflozin (JARDIANCE) 25 MG TABS tablet Take 25 mg by mouth daily.    Marland Kitchen HYDROcodone-acetaminophen (NORCO/VICODIN) 5-325 MG tablet Take 1 tablet by mouth every 8 (eight) hours as needed for moderate pain. 40 tablet 0  . lidocaine-prilocaine (EMLA) cream Apply 1 application topically as needed. Apply to port a cath site 1 hour before chemotherapy treatment. 30 g 6  . lisinopril-hydrochlorothiazide (PRINZIDE,ZESTORETIC) 20-25 MG per tablet Take 1 tablet by mouth daily.     . phenytoin (DILANTIN) 100 MG ER capsule Take 300 mg by mouth daily.     . pioglitazone (ACTOS) 45 MG tablet Take 1 tablet by mouth daily.    . ondansetron (ZOFRAN ODT) 4 MG disintegrating tablet Take 1 tablet (4 mg total) by mouth every 8 (eight) hours as needed for nausea or vomiting. (Patient not taking: Reported on 01/03/2017) 20 tablet 0  . polyethylene glycol powder (MIRALAX) powder Take 255 g by mouth once. Take as directed 255 g 0  . VENTOLIN HFA 108 (90 Base) MCG/ACT inhaler Inhale 1 puff into the lungs every 6 (six) hours as needed. Reported on 01/20/2016     No current facility-administered medications for this visit.    Facility-Administered Medications Ordered in Other Visits  Medication Dose Route Frequency Provider Last Rate Last Dose  . albuterol (PROVENTIL) (2.5 MG/3ML) 0.083% nebulizer solution 2.5 mg  2.5 mg Nebulization Once Nestor Lewandowsky, MD      . sodium chloride flush (NS) 0.9 % injection 10 mL  10 mL Intravenous PRN Cammie Sickle, MD   10 mL at 12/01/15 0850    PHYSICAL EXAMINATION: ECOG PERFORMANCE STATUS: 0 - Asymptomatic  BP 123/80    Pulse 80   Temp 97.6 F (36.4 C) (Tympanic)   Resp 20   Ht '5\' 5"'$  (1.651 m)   Wt 162 lb (73.5 kg)   BMI 26.96 kg/m   Filed Weights   01/03/17 0930  Weight: 162 lb (73.5 kg)    GENERAL: Well-nourished well-developed; Alert, no distress and comfortable.She is alone. EYES: no pallor or icterus OROPHARYNX: no thrush or ulceration.   NECK: supple, no masses felt LYMPH: no palpable lymphadenopathy in the cervical, axillary or inguinal regions LUNGS: clear to auscultation and No wheeze or crackles HEART/CVS: regular rate & rhythm and no murmurs; No lower extremity edema ABDOMEN:abdomen soft, non-tender and normal bowel sounds; site of liver biopsy well-healed. No infection. Musculoskeletal:no cyanosis  of digits and no clubbing  PSYCH: alert & oriented x 3; anxious.  NEURO: no focal motor/sensory deficits Skin- normal limits.  LABORATORY DATA:  I have reviewed the data as listed    Component Value Date/Time   NA 137 01/03/2017 0856   NA 141 08/03/2014 0050   K 3.9 01/03/2017 0856   K 3.3 (L) 08/03/2014 0050   CL 101 01/03/2017 0856   CL 108 (H) 08/03/2014 0050   CO2 28 01/03/2017 0856   CO2 27 08/03/2014 0050   GLUCOSE 187 (H) 01/03/2017 0856   GLUCOSE 164 (H) 08/03/2014 0050   BUN 41 (H) 01/03/2017 0856   BUN 14 08/03/2014 0050   CREATININE 1.00 01/03/2017 0856   CREATININE 0.68 08/03/2014 0050   CALCIUM 9.1 01/03/2017 0856   CALCIUM 8.2 (L) 08/03/2014 0050   PROT 7.3 01/03/2017 0856   PROT 6.7 04/11/2013 2241   ALBUMIN 3.4 (L) 01/03/2017 0856   ALBUMIN 2.7 (L) 04/11/2013 2241   AST 91 (H) 01/03/2017 0856   AST 13 (L) 04/11/2013 2241   ALT 70 (H) 01/03/2017 0856   ALT 16 04/11/2013 2241   ALKPHOS 256 (H) 01/03/2017 0856   ALKPHOS 207 (H) 04/11/2013 2241   BILITOT 0.3 01/03/2017 0856   BILITOT 0.1 (L) 04/11/2013 2241   GFRNONAA 58 (L) 01/03/2017 0856   GFRNONAA >60 08/03/2014 0050   GFRNONAA >60 04/11/2013 2241   GFRAA >60 01/03/2017 0856   GFRAA >60  08/03/2014 0050   GFRAA >60 04/11/2013 2241    No results found for: SPEP, UPEP  Lab Results  Component Value Date   WBC 3.8 01/03/2017   NEUTROABS 2.0 01/03/2017   HGB 12.0 01/03/2017   HCT 34.7 (L) 01/03/2017   MCV 85.0 01/03/2017   PLT 172 01/03/2017      Chemistry      Component Value Date/Time   NA 137 01/03/2017 0856   NA 141 08/03/2014 0050   K 3.9 01/03/2017 0856   K 3.3 (L) 08/03/2014 0050   CL 101 01/03/2017 0856   CL 108 (H) 08/03/2014 0050   CO2 28 01/03/2017 0856   CO2 27 08/03/2014 0050   BUN 41 (H) 01/03/2017 0856   BUN 14 08/03/2014 0050   CREATININE 1.00 01/03/2017 0856   CREATININE 0.68 08/03/2014 0050      Component Value Date/Time   CALCIUM 9.1 01/03/2017 0856   CALCIUM 8.2 (L) 08/03/2014 0050   ALKPHOS 256 (H) 01/03/2017 0856   ALKPHOS 207 (H) 04/11/2013 2241   AST 91 (H) 01/03/2017 0856   AST 13 (L) 04/11/2013 2241   ALT 70 (H) 01/03/2017 0856   ALT 16 04/11/2013 2241   BILITOT 0.3 01/03/2017 0856   BILITOT 0.1 (L) 04/11/2013 2241        ASSESSMENT & PLAN:   Carcinoma of overlapping sites of right breast in female, estrogen receptor negative ([place]) # Recurrent/metastatic breast cancer- biopsy-proven liver lesion. TRIPLE NEGATIVE.  Currently on palliative chemo on -Taxol weekly s/p cycle #1 [3 weekly treatments]  # proceed with cycle # 2 day-1 Labs today reviewed;  acceptable for treatment today- except for slightly elevated LFTs [C discussion below]  # Chronic pain/and pain from breast surgery- continue hydrocodone1 every 8 hours as needed. New prescription given.  # PORT malfunction- will plan to get dye study.   # LFTs- elevated G-1; ? Taxol- monitor for now. HOLD lipitor.   # anxiety/ insomnia- on valium 5 mg BID when necessary.   # DVT bil-  on Elquis; continue the same.   # Paranoia/psychiatric illness- declines evaluation.   #  Chemotherapy in 1 week/labs; follow-up with me in 2 weeks/lab [[place]].      Cammie Sickle, MD 01/04/2017 4:47 PM

## 2017-01-05 ENCOUNTER — Ambulatory Visit: Payer: Medicare Other | Admitting: Radiation Oncology

## 2017-01-05 ENCOUNTER — Other Ambulatory Visit: Payer: Medicare Other

## 2017-01-06 ENCOUNTER — Encounter: Payer: Self-pay | Admitting: Radiation Oncology

## 2017-01-06 ENCOUNTER — Other Ambulatory Visit: Payer: Self-pay | Admitting: *Deleted

## 2017-01-06 ENCOUNTER — Ambulatory Visit
Admission: RE | Admit: 2017-01-06 | Discharge: 2017-01-06 | Disposition: A | Discharge status: Home / Self Care | Payer: Medicare Other | Source: Ambulatory Visit | Attending: Radiation Oncology | Admitting: Radiation Oncology

## 2017-01-06 VITALS — BP 146/79 | HR 83 | Temp 97.5°F | Resp 18 | Wt 163.5 lb

## 2017-01-06 DIAGNOSIS — Z171 Estrogen receptor negative status [ER-]: Secondary | ICD-10-CM | POA: Diagnosis not present

## 2017-01-06 DIAGNOSIS — Z9221 Personal history of antineoplastic chemotherapy: Secondary | ICD-10-CM | POA: Diagnosis not present

## 2017-01-06 DIAGNOSIS — C50411 Malignant neoplasm of upper-outer quadrant of right female breast: Secondary | ICD-10-CM | POA: Diagnosis present

## 2017-01-06 DIAGNOSIS — Z923 Personal history of irradiation: Secondary | ICD-10-CM | POA: Diagnosis not present

## 2017-01-06 DIAGNOSIS — R634 Abnormal weight loss: Secondary | ICD-10-CM | POA: Insufficient documentation

## 2017-01-06 DIAGNOSIS — F411 Generalized anxiety disorder: Secondary | ICD-10-CM

## 2017-01-06 DIAGNOSIS — C787 Secondary malignant neoplasm of liver and intrahepatic bile duct: Secondary | ICD-10-CM | POA: Insufficient documentation

## 2017-01-06 MED ORDER — DIAZEPAM 5 MG PO TABS: 5.0000 mg | ORAL_TABLET | Freq: Two times a day (BID) | ORAL | 0 refills | Status: DC | PRN

## 2017-01-06 NOTE — Progress Notes (Signed)
Radiation Oncology Follow up Note  Name: Dawn Allen   Date:   01/06/2017 MRN:  [id number] DOB: 11-Feb-1951    This 66 y.o. female presents to the clinic today for follow-up of both breast cancer and lung cancer.  REFERRING PROVIDER: Remi Haggard, FNP  HPI: patient is a 66 year old female originally presented in January 2017 with right breast cancer triple negative stage IV with liver metastasis. She is also treated back in 2016 to her left upper lobe with stereotactic radiation therapy. She is currently on.palliative chemotherapy with Taxol. She specifically denies breast tenderness cough or bone pain.  COMPLICATIONS OF TREATMENT: none  FOLLOW UP COMPLIANCE: keeps appointments   PHYSICAL EXAM:  BP (!) 146/79   Pulse 83   Temp 97.5 F (36.4 C)   Resp 18   Wt 163 lb 7.5 oz (74.2 kg)   BMI 27.20 kg/m  Thin slightly cachectic female in NAD.Lungs are clear to A&P cardiac examination essentially unremarkable with regular rate and rhythm. No dominant mass or nodularity is noted in either breast in 2 positions examined. Incision is well-healed. No axillary or supraclavicular adenopathy is appreciated. Cosmetic result is excellent. Well-developed well-nourished patient in NAD. HEENT reveals PERLA, EOMI, discs not visualized.  Oral cavity is clear. No oral mucosal lesions are identified. Neck is clear without evidence of cervical or supraclavicular adenopathy. Lungs are clear to A&P. Cardiac examination is essentially unremarkable with regular rate and rhythm without murmur rub or thrill. Abdomen is benign with no organomegaly or masses noted. Motor sensory and DTR levels are equal and symmetric in the upper and lower extremities. Cranial nerves II through XII are grossly intact. Proprioception is intact. No peripheral adenopathy or edema is identified. No motor or sensory levels are noted. Crude visual fields are within normal range.  RADIOLOGY RESULTS: March CT scan of abdomen and pelvis  with contrast reviewed  PLAN: present time patient is on palliative chemotherapy for stage IV breast cancer triple negative with liver metastasis. She continues to lose significant weight. I see no reason for any palliative radiation therapy to this time. I have asked to see her back in 6 months for follow-up. She continues close follow-up care and palliative chemotherapy with medical oncology. We'll be happy to reevaluate her any time should further palliative treatment be indicated.  I would like to take this opportunity to thank you for allowing me to participate in the care of your patient.Armstead Peaks., MD

## 2017-01-07 ENCOUNTER — Other Ambulatory Visit: Payer: Self-pay | Admitting: *Deleted

## 2017-01-07 ENCOUNTER — Inpatient Hospital Stay: Payer: Medicare Other

## 2017-01-07 ENCOUNTER — Ambulatory Visit
Admission: RE | Admit: 2017-01-07 | Discharge: 2017-01-07 | Disposition: A | Discharge status: Home / Self Care | Payer: Medicare Other | Source: Ambulatory Visit | Attending: Internal Medicine | Admitting: Internal Medicine

## 2017-01-07 DIAGNOSIS — Z452 Encounter for adjustment and management of vascular access device: Secondary | ICD-10-CM

## 2017-01-07 DIAGNOSIS — C50811 Malignant neoplasm of overlapping sites of right female breast: Secondary | ICD-10-CM | POA: Diagnosis not present

## 2017-01-07 DIAGNOSIS — Z171 Estrogen receptor negative status [ER-]: Secondary | ICD-10-CM | POA: Diagnosis not present

## 2017-01-07 DIAGNOSIS — Z95828 Presence of other vascular implants and grafts: Secondary | ICD-10-CM

## 2017-01-07 MED ORDER — IOPAMIDOL (ISOVUE-300) INJECTION 61%
50.0000 mL | Freq: Once | INTRAVENOUS | Status: AC | PRN
  Administered 2017-01-07: 5 mL

## 2017-01-07 MED ORDER — HEPARIN SOD (PORK) LOCK FLUSH 100 UNIT/ML IV SOLN
500.0000 [IU] | Freq: Once | INTRAVENOUS | Status: AC
  Administered 2017-01-07: 500 [IU] via INTRAVENOUS
  Filled 2017-01-07: qty 5

## 2017-01-10 ENCOUNTER — Inpatient Hospital Stay: Payer: Medicare Other

## 2017-01-10 VITALS — BP 130/72 | HR 76 | Temp 95.8°F | Resp 20

## 2017-01-10 DIAGNOSIS — Z171 Estrogen receptor negative status [ER-]: Principal | ICD-10-CM

## 2017-01-10 DIAGNOSIS — C50811 Malignant neoplasm of overlapping sites of right female breast: Secondary | ICD-10-CM

## 2017-01-10 DIAGNOSIS — Z5111 Encounter for antineoplastic chemotherapy: Secondary | ICD-10-CM | POA: Diagnosis not present

## 2017-01-10 DIAGNOSIS — Z95828 Presence of other vascular implants and grafts: Secondary | ICD-10-CM

## 2017-01-10 LAB — CBC WITH DIFFERENTIAL/PLATELET
BASOS ABS: 0 10*3/uL (ref 0–0.1)
Basophils Relative: 1 %
EOS ABS: 0.1 10*3/uL (ref 0–0.7)
EOS PCT: 3 %
HCT: 35.3 % (ref 35.0–47.0)
Hemoglobin: 12.1 g/dL (ref 12.0–16.0)
LYMPHS PCT: 27 %
Lymphs Abs: 1.2 10*3/uL (ref 1.0–3.6)
MCH: 29.5 pg (ref 26.0–34.0)
MCHC: 34.3 g/dL (ref 32.0–36.0)
MCV: 86 fL (ref 80.0–100.0)
MONO ABS: 0.4 10*3/uL (ref 0.2–0.9)
Monocytes Relative: 10 %
Neutro Abs: 2.7 10*3/uL (ref 1.4–6.5)
Neutrophils Relative %: 59 %
PLATELETS: 173 10*3/uL (ref 150–440)
RBC: 4.1 MIL/uL (ref 3.80–5.20)
RDW: 17.6 % — AB (ref 11.5–14.5)
WBC: 4.4 10*3/uL (ref 3.6–11.0)

## 2017-01-10 LAB — COMPREHENSIVE METABOLIC PANEL
ALT: 59 U/L — AB (ref 14–54)
AST: 79 U/L — AB (ref 15–41)
Albumin: 3.5 g/dL (ref 3.5–5.0)
Alkaline Phosphatase: 279 U/L — ABNORMAL HIGH (ref 38–126)
Anion gap: 7 (ref 5–15)
BUN: 40 mg/dL — ABNORMAL HIGH (ref 6–20)
CHLORIDE: 100 mmol/L — AB (ref 101–111)
CO2: 28 mmol/L (ref 22–32)
Calcium: 9 mg/dL (ref 8.9–10.3)
Creatinine, Ser: 1.01 mg/dL — ABNORMAL HIGH (ref 0.44–1.00)
GFR, EST NON AFRICAN AMERICAN: 57 mL/min — AB (ref >60)
Glucose, Bld: 161 mg/dL — ABNORMAL HIGH (ref 65–99)
Potassium: 4.1 mmol/L (ref 3.5–5.1)
SODIUM: 135 mmol/L (ref 135–145)
Total Bilirubin: 0.3 mg/dL (ref 0.3–1.2)
Total Protein: 7.4 g/dL (ref 6.5–8.1)

## 2017-01-10 MED ORDER — FAMOTIDINE IN NACL 20-0.9 MG/50ML-% IV SOLN
20.0000 mg | Freq: Once | INTRAVENOUS | Status: AC
  Administered 2017-01-10: 20 mg via INTRAVENOUS

## 2017-01-10 MED ORDER — SODIUM CHLORIDE 0.9 % IV SOLN
Freq: Once | INTRAVENOUS | Status: AC
  Administered 2017-01-10: 10:00:00 via INTRAVENOUS
  Filled 2017-01-10: qty 1000

## 2017-01-10 MED ORDER — PACLITAXEL CHEMO INJECTION 300 MG/50ML
80.0000 mg/m2 | Freq: Once | INTRAVENOUS | Status: AC
  Administered 2017-01-10: 144 mg via INTRAVENOUS
  Filled 2017-01-10: qty 24

## 2017-01-10 MED ORDER — DIPHENHYDRAMINE HCL 50 MG/ML IJ SOLN
25.0000 mg | Freq: Once | INTRAMUSCULAR | Status: AC
  Administered 2017-01-10: 25 mg via INTRAVENOUS
  Filled 2017-01-10: qty 1

## 2017-01-10 MED ORDER — DEXAMETHASONE SODIUM PHOSPHATE 10 MG/ML IJ SOLN
10.0000 mg | Freq: Once | INTRAMUSCULAR | Status: AC
  Administered 2017-01-10: 10 mg via INTRAVENOUS
  Filled 2017-01-10: qty 1

## 2017-01-10 NOTE — Progress Notes (Signed)
PIV in left [facility] noted to have good blood return, easy to flush prior to starting taxol infusion.  Approximately 5 minutes into the taxol infusion, pump began to beep occlusion.  Unable to flush PIV, no blood return.  No swelling or evidence of extravasation noted.  No complaints from patient about area burning or stinging.  Unable to obtain adequate IV access today.  [name] aware.  Pt to be rescheduled once her portacath is repaired.  Pt aware and left chemo area stable and ambulatory.

## 2017-01-12 ENCOUNTER — Inpatient Hospital Stay: Payer: Medicare Other

## 2017-01-12 ENCOUNTER — Telehealth: Payer: Self-pay

## 2017-01-12 NOTE — Telephone Encounter (Signed)
An appointment was already scheduled last week for the patient. Please, still contact Sheri at the [facility] to discuss this.

## 2017-01-12 NOTE — Telephone Encounter (Signed)
Sheri from the cancer center is wanting Amber to call her back at [phone] or [phone] to discuss placing an urgent request for a port a cath on the patient. Please call Barbera Setters and advice

## 2017-01-13 ENCOUNTER — Telehealth: Payer: Self-pay | Admitting: Surgery

## 2017-01-13 ENCOUNTER — Other Ambulatory Visit: Payer: Self-pay

## 2017-01-13 ENCOUNTER — Encounter: Payer: Self-pay | Admitting: *Deleted

## 2017-01-13 DIAGNOSIS — C50919 Malignant neoplasm of unspecified site of unspecified female breast: Secondary | ICD-10-CM

## 2017-01-13 NOTE — Progress Notes (Signed)
  Oncology Nurse Navigator Documentation  Navigator Location: [facility] (01/13/17 1300)   )Navigator Encounter Type: Telephone (01/13/17 1300) Telephone: [place] Call (01/13/17 1300)                               Coordination of Care: Appts (01/13/17 1300)                  Time Spent with Patient: 45 (01/13/17 1300)   Talked to patient today to work on coordinating her Port a cath removal and replacement.  She is scheduled to see Dr. Adonis Huguenin tomorrow on 01/14/17 @ 9:15.  [name] is to pick her up for that appointment.  Her surgery date is for Tuesday 5/29 @ 7:30.  [name] will take her, but she is aware she will need a family member or friend to pick her up and take her home.  Reminded her she will need to stop the Eliquis 2 days prior to her surgery.  Informed her she will be educated again about that at her visit tomorrow.  She is agreeable to the plan.

## 2017-01-13 NOTE — Telephone Encounter (Signed)
Pt advised of pre op date/time and sx date. Sx: 01/18/17 with Dr Diego Cory Removal and [facility].  Pre op: day of surgery.   Patient made aware to arrive at 7:30am the day of surgery at [place].

## 2017-01-14 ENCOUNTER — Ambulatory Visit (INDEPENDENT_AMBULATORY_CARE_PROVIDER_SITE_OTHER): Payer: Medicare Other | Admitting: Surgery

## 2017-01-14 ENCOUNTER — Inpatient Hospital Stay: Admission: RE | Admit: 2017-01-14 | Payer: Medicare Other | Source: Ambulatory Visit

## 2017-01-14 ENCOUNTER — Encounter: Payer: Self-pay | Admitting: Surgery

## 2017-01-14 VITALS — BP 138/84 | HR 87 | Temp 98.3°F | Ht 65.0 in | Wt 161.5 lb

## 2017-01-14 DIAGNOSIS — Z171 Estrogen receptor negative status [ER-]: Secondary | ICD-10-CM | POA: Diagnosis not present

## 2017-01-14 DIAGNOSIS — T829XXD Unspecified complication of cardiac and vascular prosthetic device, implant and graft, subsequent encounter: Secondary | ICD-10-CM

## 2017-01-14 DIAGNOSIS — C50811 Malignant neoplasm of overlapping sites of right female breast: Secondary | ICD-10-CM | POA: Diagnosis not present

## 2017-01-14 NOTE — Patient Instructions (Addendum)
We will see you on Tuesday morning at the hospital- Your arrival time is 0730am on Tuesday morning. [name] has arranged to pick you up at your house at [place].  I spoke with [facility]. As long as you have clean shorts, underpants, and socks on- you may leave them on.  You will most likely be at the hospital until approximately 1130am.

## 2017-01-14 NOTE — Progress Notes (Signed)
Surgical Clinic History and Physical  Referring provider:  Remi Haggard, [place], [place] [address]  HISTORY OF PRESENT ILLNESS (HPI):  66 y.o. 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 Patient reports the port has not been working x a couple of weeks, denies any prior RUE swelling/edema or prior indwelling central venous catheter. Of note, patient is on chronic anticoagulation >1 year for a remote history of B/L lower extremity DVT's s/p remote placement of IVC filter. Patient otherwise denies any fever/chills, CP, or SOB.  PAST MEDICAL HISTORY (PMH):  Past Medical History:  Diagnosis Date  . Anxiety   . Breast cancer ([place]) 08/2015   right breast, chemo  . Cancer of right female breast ([place])   . Coronary artery disease    a. NSTEMI cath 08/02/14: LM nl, pLAD 50%, mLCx 30%, mRCA 95% s/p PCI/DES, 2nd lesion 40%, EF 55%  . Diabetes mellitus without complication ([place])   . DVT (deep venous thrombosis) ([facility])    LEFT LEG   . HLD (hyperlipidemia)   . Hypertension   . Lung cancer ([place])   . Lung nodule   . MI (myocardial infarction) ([place])   . Seizure disorder ([place])   . Seizures ([place])   . Squamous cell lung cancer ([place] [facility] [place]) 04/23/2015     PAST SURGICAL HISTORY ([place]):  Past Surgical History:  Procedure Laterality Date  . ABDOMINAL HYSTERECTOMY     PARTIAL   . AXILLARY LYMPH NODE BIOPSY Right 05/26/2016   Procedure: AXILLARY LYMPH NODE BIOPSY;  Surgeon: Hubbard Robinson, MD;  Location: [facility] ORS;  Service: General;  Laterality: Right;  . BREAST BIOPSY Right 09/15/2015   positve  . CARDIAC CATHETERIZATION  08/02/2014  . CORONARY ANGIOPLASTY  08/02/2014   drug eluting stent  placement  . IVC FILTER PLACEMENT ([facility] HX)    . LEG SURGERY Right    BLOOD CLOT  . MASTECTOMY, PARTIAL Right 05/26/2016   Procedure: MASTECTOMY PARTIAL;  Surgeon: Hubbard Robinson, MD;  Location: [facility] ORS;  Service: General;  Laterality: Right;  . PORTACATH PLACEMENT Left 10/13/2015   Procedure: INSERTION PORT-A-CATH;  Surgeon: Hubbard Robinson, MD;  Location: [facility] ORS;  Service: General;  Laterality: Left;  . SENTINEL NODE BIOPSY Right 05/26/2016   Procedure: SENTINEL NODE BIOPSY;  Surgeon: Hubbard Robinson, MD;  Location: [facility] ORS;  Service: General;  Laterality: Right;     MEDICATIONS:  Prior to Admission medications   Medication Sig Start Date End Date Taking? Authorizing Provider  apixaban (ELIQUIS) 2.5 MG TABS tablet Take 1 tablet (2.5 mg total) by mouth 2 (two) times daily. Reported on 01/01/2016 12/20/16   Cammie Sickle, MD  Aspirin-Salicylamide-Caffeine [facility] HEADACHE POWDER PO) Take 1 packet by mouth daily.    [provider]  atorvastatin (LIPITOR) 40 MG tablet Take 1 tablet by mouth daily. 05/24/16   [provider]  diazepam (VALIUM) 5 MG tablet Take 1 tablet (5 mg total) by mouth 2 (two) times daily as needed. Reported on 12/16/2015 01/06/17   Cammie Sickle, MD  empagliflozin (JARDIANCE) 25 MG TABS tablet Take 25 mg by mouth daily.    [provider]  HYDROcodone-acetaminophen (NORCO/VICODIN) 5-325 MG tablet Take 1 tablet by mouth every 8 (eight) hours as  needed for moderate pain. 01/03/17   Cammie Sickle, MD  lidocaine-prilocaine (EMLA) cream Apply 1 application topically as needed. Apply to port a cath site 1 hour before chemotherapy treatment. 12/14/16   Cammie Sickle, MD  lisinopril-hydrochlorothiazide (PRINZIDE,ZESTORETIC) 20-25 MG per tablet Take 1 tablet by mouth daily.  07/26/14   [provider]  ondansetron (ZOFRAN ODT) 4 MG disintegrating tablet Take 1 tablet (4 mg total) by mouth every 8 (eight) hours as  needed for nausea or vomiting. Patient not taking: Reported on 01/03/2017 10/26/16   Carrie Mew, MD  phenytoin (DILANTIN) 100 MG ER capsule Take 300 mg by mouth daily.     [provider]  pioglitazone (ACTOS) 45 MG tablet Take 1 tablet by mouth daily. 01/28/15   [provider]  polyethylene glycol powder (MIRALAX) powder Take 255 g by mouth once. Take as directed 06/08/16 06/08/16  Cammie Sickle, MD  VENTOLIN HFA 108 (90 Base) MCG/ACT inhaler Inhale 1 puff into the lungs every 6 (six) hours as needed. Reported on 01/20/2016 01/09/16   [provider]     ALLERGIES:  Allergies  Allergen Reactions  . Penicillins Swelling    rash     SOCIAL HISTORY:  Social History   Social History  . Marital status: Single    Spouse name: N/A  . Number of children: N/A  . Years of education: N/A   Occupational History  . Not on file.   Social History Main Topics  . Smoking status: Current Every Day Smoker    Packs/day: 1.00    Years: 45.00    Types: Cigarettes  . Smokeless tobacco: Never Used  . Alcohol use No  . Drug use: No  . Sexual activity: Yes    Partners: Male   Other Topics Concern  . Not on file   Social History Narrative  . No narrative on file    The patient currently resides (home / rehab facility / nursing home): Home  The patient normally is (ambulatory / bedbound) : Ambulatory   FAMILY HISTORY:  Family History  Problem Relation Age of Onset  . Heart attack Mother   . Breast cancer Sister 90       bilaterally breast cancer    Otherwise negative/non-contributory.  REVIEW OF SYSTEMS:  Constitutional: denies any other weight loss, fever, chills, or sweats  Eyes: denies any other vision changes, history of eye injury  ENT: denies sore throat, hearing problems  Respiratory: denies shortness of breath, wheezing  Cardiovascular: denies chest pain, palpitations  Gastrointestinal: denies abdominal pain, N/V, or  diarrhea Musculoskeletal: denies any other joint pains or cramps  Skin: Denies any other rashes or skin discolorations  Neurological: denies any other headache, dizziness, weakness  Psychiatric: Denies any other depression, anxiety   All other review of systems were otherwise negative   VITAL SIGNS:  BP 138/84   Pulse 87   Temp 98.3 F (36.8 C) (Oral)   Ht _0  (1.651 m)   Wt 161 lb 8 oz (73.3 kg)   BMI 26.88 kg/m   PHYSICAL EXAM:  Constitutional:  -- Normal body habitus  -- Awake, alert, and oriented x3  Eyes:  -- Pupils equally round and reactive to light  -- No scleral icterus  Ear, nose, throat:  -- No jugular venous distension -- No nasal drainage, bleeding Pulmonary:  -- No crackles  -- Equal breath sounds bilaterally -- Breathing non-labored at rest Cardiovascular:  -- S1, S2 present  --  No pericardial rubs  Gastrointestinal:  -- Abdomen soft, nontender, nondistended, no guarding/rebound  -- No abdominal masses appreciated, pulsatile or otherwise  Musculoskeletal and Integumentary:  -- Wounds or skin discoloration: Left upper chest port site well-healed without erythema or drainage, Right chest post-lumpectomy and post-sentinel node excision axillary incisions well-healed without erythema or drainage -- Extremities: B/L UE and LE FROM, hands and feet warm, no edema  Neurologic:  -- Motor function: Intact and symmetric -- Sensation: Intact and symmetric  Labs:  CBC:  Lab Results  Component Value Date   WBC 4.4 01/10/2017   RBC 4.10 01/10/2017   BMP:  Lab Results  Component Value Date   GLUCOSE 161 (H) 01/10/2017   GLUCOSE 164 (H) 08/03/2014   CO2 28 01/10/2017   CO2 27 08/03/2014   BUN 40 (H) 01/10/2017   BUN 14 08/03/2014   CREATININE 1.01 (H) 01/10/2017   CREATININE 0.68 08/03/2014   CALCIUM 9.0 01/10/2017   CALCIUM 8.2 (L) 08/03/2014   Coagulation:  Lab Results  Component Value Date   INR 1.11 11/30/2016   INR 1.1 08/01/2014   APTT 24  11/30/2016   APTT 26.2 08/01/2014     Imaging studies:  Fluoroscopic Contrast Study of Left Subclavian Central Venous Catheter (01/07/2017) After obtaining informed consent the patient's Port-A-Cath was accessed by nursing. 5 cc of nonionic contrast administered. Contrast is noted to extravasated at the level of the midportion of the left subclavian limb of the catheter. This may be from a a tiny catheter fracture. The catheter appears to be intact with no displaced fragments noted. No contrast entered into the central venous system.   Assessment/Plan:  66 y.o. female with dysfunction of Left subclavian central venous catheter with subcutaneous port required for palliative Taxol chemotherapy for stage IV ER- PR- Her2- breast cancer with liver metastases and history of Left upper lobe squamous lung cancer, complicated by co-morbidities including DM, HTN, HLD, CAD s/p NSTEMI (2015), chronic anticoagulation for remote B/L lower extremity DVT's s/p remote placement of IVC filter, generalized anxiety disorder, and seizure disorder.   - patient instructed and acknowledges to not take Eliquis Sunday, 5/27 or Monday, 5/28  - if DVT's provoked and non-recurrent without specific hypercoagulopathy, may be able to check B/L lower extremity venous doppler and discontinue chronic anticoagulation if no DVT's   - all risks, benefits, and alternatives to removal of Left subclavian central venous catheter with subcutaneous port and placement of new central venous catheter with subcutaneous port (will plan to attempt Right IJ access first) were discussed with the patient, all of her questions were answered to her expressed satisfaction, patient expresses she wishes to proceed, and informed consent was accordingly obtained.  - will plan for removal of Left subclavian central venous catheter with subcutaneous port and placement of new central venous catheter with subcutaneous port this coming Tuesday, 5/29 prior to  patient's next scheduled chemotherapy administration Wednesday, 5/30  - return to clinic for post-procedural follow-up 2 weeks after above planned procedures   All of the above recommendations were discussed with the patient, and all of patient's questions were answered to her expressed satisfaction.  Thank you for the opportunity to participate in this patient's care.  -- Marilynne Drivers Rosana Hoes, MD, [place]: [facility] - Partnering for exceptional care. Office: [phone]

## 2017-01-17 MED ORDER — CIPROFLOXACIN IN D5W 400 MG/200ML IV SOLN: 400.0000 mg | INTRAVENOUS | Status: DC

## 2017-01-18 ENCOUNTER — Ambulatory Visit: Payer: Medicare Other

## 2017-01-18 ENCOUNTER — Inpatient Hospital Stay: Payer: Medicare Other

## 2017-01-18 ENCOUNTER — Encounter: Payer: Self-pay | Admitting: *Deleted

## 2017-01-18 ENCOUNTER — Telehealth: Payer: Self-pay | Admitting: *Deleted

## 2017-01-18 ENCOUNTER — Ambulatory Visit
Admission: RE | Admit: 2017-01-18 | Discharge: 2017-01-18 | Disposition: A | Discharge status: Home / Self Care | Payer: Medicare Other | Source: Ambulatory Visit | Attending: Surgery | Admitting: Surgery

## 2017-01-18 ENCOUNTER — Encounter
Admission: RE | Disposition: A | Discharge status: Home / Self Care | Payer: Self-pay | Source: Ambulatory Visit | Attending: Surgery

## 2017-01-18 ENCOUNTER — Ambulatory Visit: Payer: Medicare Other | Admitting: Internal Medicine

## 2017-01-18 DIAGNOSIS — Z803 Family history of malignant neoplasm of breast: Secondary | ICD-10-CM | POA: Insufficient documentation

## 2017-01-18 DIAGNOSIS — C50911 Malignant neoplasm of unspecified site of right female breast: Secondary | ICD-10-CM | POA: Diagnosis not present

## 2017-01-18 DIAGNOSIS — E119 Type 2 diabetes mellitus without complications: Secondary | ICD-10-CM | POA: Insufficient documentation

## 2017-01-18 DIAGNOSIS — Z171 Estrogen receptor negative status [ER-]: Secondary | ICD-10-CM | POA: Diagnosis not present

## 2017-01-18 DIAGNOSIS — Z79899 Other long term (current) drug therapy: Secondary | ICD-10-CM | POA: Insufficient documentation

## 2017-01-18 DIAGNOSIS — F17210 Nicotine dependence, cigarettes, uncomplicated: Secondary | ICD-10-CM | POA: Diagnosis not present

## 2017-01-18 DIAGNOSIS — E785 Hyperlipidemia, unspecified: Secondary | ICD-10-CM | POA: Diagnosis not present

## 2017-01-18 DIAGNOSIS — Z90711 Acquired absence of uterus with remaining cervical stump: Secondary | ICD-10-CM | POA: Diagnosis not present

## 2017-01-18 DIAGNOSIS — I2510 Atherosclerotic heart disease of native coronary artery without angina pectoris: Secondary | ICD-10-CM | POA: Diagnosis not present

## 2017-01-18 DIAGNOSIS — C3412 Malignant neoplasm of upper lobe, left bronchus or lung: Secondary | ICD-10-CM | POA: Diagnosis present

## 2017-01-18 DIAGNOSIS — Z8249 Family history of ischemic heart disease and other diseases of the circulatory system: Secondary | ICD-10-CM | POA: Diagnosis not present

## 2017-01-18 DIAGNOSIS — Z7901 Long term (current) use of anticoagulants: Secondary | ICD-10-CM | POA: Insufficient documentation

## 2017-01-18 DIAGNOSIS — Z955 Presence of coronary angioplasty implant and graft: Secondary | ICD-10-CM | POA: Insufficient documentation

## 2017-01-18 DIAGNOSIS — Z86718 Personal history of other venous thrombosis and embolism: Secondary | ICD-10-CM | POA: Diagnosis not present

## 2017-01-18 DIAGNOSIS — I252 Old myocardial infarction: Secondary | ICD-10-CM | POA: Insufficient documentation

## 2017-01-18 DIAGNOSIS — Z880 Allergy status to penicillin: Secondary | ICD-10-CM | POA: Insufficient documentation

## 2017-01-18 DIAGNOSIS — Z9221 Personal history of antineoplastic chemotherapy: Secondary | ICD-10-CM | POA: Insufficient documentation

## 2017-01-18 DIAGNOSIS — Z452 Encounter for adjustment and management of vascular access device: Secondary | ICD-10-CM | POA: Insufficient documentation

## 2017-01-18 DIAGNOSIS — G40909 Epilepsy, unspecified, not intractable, without status epilepticus: Secondary | ICD-10-CM | POA: Diagnosis not present

## 2017-01-18 DIAGNOSIS — C787 Secondary malignant neoplasm of liver and intrahepatic bile duct: Secondary | ICD-10-CM | POA: Insufficient documentation

## 2017-01-18 DIAGNOSIS — Z789 Other specified health status: Secondary | ICD-10-CM

## 2017-01-18 DIAGNOSIS — I10 Essential (primary) hypertension: Secondary | ICD-10-CM | POA: Insufficient documentation

## 2017-01-18 HISTORY — PX: PORT-A-CATH REMOVAL: SHX5289

## 2017-01-18 HISTORY — PX: PORTACATH PLACEMENT: SHX2246

## 2017-01-18 LAB — GLUCOSE, CAPILLARY: Glucose-Capillary: 167 mg/dL — ABNORMAL HIGH (ref 65–99)

## 2017-01-18 SURGERY — INSERTION, TUNNELED CENTRAL VENOUS DEVICE, WITH PORT
Anesthesia: LOCAL | Laterality: Right | Wound class: Clean

## 2017-01-18 MED ORDER — FENTANYL CITRATE (PF) 100 MCG/2ML IJ SOLN
INTRAMUSCULAR | Status: AC
  Filled 2017-01-18: qty 2

## 2017-01-18 MED ORDER — HEPARIN SODIUM (PORCINE) 5000 UNIT/ML IJ SOLN
INTRAMUSCULAR | Status: AC
  Filled 2017-01-18: qty 1

## 2017-01-18 MED ORDER — ACETAMINOPHEN 325 MG PO TABS: 650.0000 mg | ORAL_TABLET | Freq: Four times a day (QID) | ORAL | Status: DC | PRN

## 2017-01-18 MED ORDER — IBUPROFEN 600 MG PO TABS: 600.0000 mg | ORAL_TABLET | Freq: Four times a day (QID) | ORAL | 0 refills | Status: DC | PRN

## 2017-01-18 MED ORDER — SODIUM CHLORIDE 0.9 % IV SOLN
INTRAVENOUS | Status: DC | PRN
  Administered 2017-01-18: 10 mL via INTRAMUSCULAR

## 2017-01-18 MED ORDER — CIPROFLOXACIN IN D5W 400 MG/200ML IV SOLN
INTRAVENOUS | Status: AC
  Filled 2017-01-18: qty 200

## 2017-01-18 MED ORDER — SODIUM CHLORIDE 0.9 % IV SOLN
INTRAVENOUS | Status: DC
  Administered 2017-01-18: 10:00:00 via INTRAVENOUS

## 2017-01-18 MED ORDER — CHLORHEXIDINE GLUCONATE CLOTH 2 % EX PADS: 6.0000 | MEDICATED_PAD | Freq: Once | CUTANEOUS | Status: DC

## 2017-01-18 MED ORDER — ACETAMINOPHEN 325 MG PO TABS
ORAL_TABLET | ORAL | Status: AC
  Administered 2017-01-18: 650 mg
  Filled 2017-01-18: qty 2

## 2017-01-18 MED ORDER — BUPIVACAINE HCL (PF) 0.5 % IJ SOLN
INTRAMUSCULAR | Status: AC
  Filled 2017-01-18: qty 30

## 2017-01-18 MED ORDER — MIDAZOLAM HCL 2 MG/2ML IJ SOLN
INTRAMUSCULAR | Status: AC
  Filled 2017-01-18: qty 2

## 2017-01-18 MED ORDER — ACETAMINOPHEN 500 MG PO TABS: 1000.0000 mg | ORAL_TABLET | Freq: Once | ORAL | Status: DC

## 2017-01-18 MED ORDER — LIDOCAINE HCL (PF) 1 % IJ SOLN
INTRAMUSCULAR | Status: AC
  Filled 2017-01-18: qty 30

## 2017-01-18 MED ORDER — LIDOCAINE HCL 1 % IJ SOLN
INTRAMUSCULAR | Status: DC | PRN
  Administered 2017-01-18: 20 mL

## 2017-01-18 SURGICAL SUPPLY — 36 items
BAG DECANTER FOR FLEXI CONT (MISCELLANEOUS) ×3 IMPLANT
BAND RUBBER 3X1/6 TAN STRL (MISCELLANEOUS) ×3 IMPLANT
BLADE SURG 15 STRL LF DISP TIS (BLADE) ×4 IMPLANT
BLADE SURG 15 STRL SS (BLADE) ×2
BLADE SURG SZ11 CARB STEEL (BLADE) ×3 IMPLANT
CANISTER SUCT 1200ML W/VALVE (MISCELLANEOUS) ×3 IMPLANT
CHLORAPREP W/TINT 10.5 ML (MISCELLANEOUS) IMPLANT
CHLORAPREP W/TINT 26ML (MISCELLANEOUS) ×6 IMPLANT
COVER PROBE FLX POLY STRL (MISCELLANEOUS) ×3 IMPLANT
DECANTER SPIKE VIAL GLASS SM (MISCELLANEOUS) ×3 IMPLANT
DERMABOND ADVANCED (GAUZE/BANDAGES/DRESSINGS) ×3
DERMABOND ADVANCED .7 DNX12 (GAUZE/BANDAGES/DRESSINGS) ×6 IMPLANT
DRAPE C-ARM 42X70 (DRAPES) ×3 IMPLANT
DRAPE LAPAROTOMY 77X122 PED (DRAPES) ×6 IMPLANT
ELECT REM PT RETURN 9FT ADLT (ELECTROSURGICAL) ×3
ELECTRODE REM PT RTRN 9FT ADLT (ELECTROSURGICAL) ×2 IMPLANT
GLOVE BIO SURGEON STRL SZ7 (GLOVE) ×3 IMPLANT
GLOVE BIOGEL PI IND STRL 7.0 (GLOVE) ×2 IMPLANT
GLOVE BIOGEL PI IND STRL 7.5 (GLOVE) ×2 IMPLANT
GLOVE BIOGEL PI INDICATOR 7.0 (GLOVE) ×1
GLOVE BIOGEL PI INDICATOR 7.5 (GLOVE) ×1
GOWN STRL REUS W/ TWL LRG LVL3 (GOWN DISPOSABLE) ×4 IMPLANT
GOWN STRL REUS W/TWL LRG LVL3 (GOWN DISPOSABLE) ×8 IMPLANT
IV NS 500ML (IV SOLUTION) ×1
IV NS 500ML BAXH (IV SOLUTION) ×2 IMPLANT
KIT RM TURNOVER STRD PROC AR (KITS) ×3 IMPLANT
NEEDLE HYPO 25X1 1.5 SAFETY (NEEDLE) ×3 IMPLANT
PACK BASIN MINOR ARMC (MISCELLANEOUS) ×3 IMPLANT
PORT POWER 8FR ISP MICROINTR (INTRODUCER) ×3 IMPLANT
Powerport ×3 IMPLANT
SUT MNCRL AB 4-0 PS2 18 (SUTURE) ×3 IMPLANT
SUT VIC AB 3-0 SH 27 (SUTURE) ×2
SUT VIC AB 3-0 SH 27X BRD (SUTURE) ×4 IMPLANT
SYR 10ML LL (SYRINGE) ×3 IMPLANT
SYR 20CC LL (SYRINGE) IMPLANT
SYRINGE 10CC LL (SYRINGE) ×3 IMPLANT

## 2017-01-18 NOTE — Progress Notes (Signed)
Report given to Art Buff at 12 noon

## 2017-01-18 NOTE — OR Nursing (Signed)
Dr. Rosana Hoes in to speak w.pt prior to OR transport, advised her local anesthesia since she swallowed gum on arrival to preop

## 2017-01-18 NOTE — Telephone Encounter (Signed)
Colette, FYI regarding patient cnl apts

## 2017-01-18 NOTE — Op Note (Signed)
SURGICAL PROCEDURE REPORT  DATE OF PROCEDURE: 01/18/2017   ATTENDING SURGEON: Corene Cornea E. Rosana Hoes, MD   ANESTHESIA: Local with light IV sedation   PRE-OPERATIVE DIAGNOSIS: Metastatic breast and advanced lung cancer requiring durable central venous access for palliative chemotherapy (ICD-10's: C50.911, C34.90)  POST-OPERATIVE DIAGNOSIS: Metastatic breast and advanced lung cancer requiring durable central venous access for palliative chemotherapy (ICD-10's: C50.911, C34.90)  PROCEDURE(S): (cpt: 10932) 1.) Excision and removal of Left subclavian central venous catheter with subcutaneous port 2.) Percutaneous access of Right internal jugular vein under ultrasound guidance  3.) Insertion of tunneled Right internal jugular Bard PowerPort central venous catheter with subcutaneous port  INTRAOPERATIVE FINDINGS: Well-incorporated Left subclavian central venous catheter with subcutaneous port without purulence or erythema and both port and catheter removed intact. Patent easily compressible Right internal jugular vein with appropriate respiratory variations and well-secured tunneled central venous catheter with subcutaneous port at completion of the procedure  INTRAOPERATIVE FLUIDS: 250 mL crystalloid  FLUOROSCOPY: 1 second  ESTIMATED BLOOD LOSS: Minimal (<20 mL)   SPECIMENS: None   IMPLANTS: 18F tunneled Bard PowerPort central venous catheter with subcutaneous port  DRAINS: None   COMPLICATIONS: None apparent   CONDITION AT COMPLETION: Hemodynamically stable, awake   DISPOSITION: PACU   INDICATION(S) FOR PROCEDURE:  Patient is a 66 y.o. female who presented with metastatic breast cancer and advanced lung cancer for which she's been receiving palliative chemotherapy until her Left subclavian central venous catheter with subcutaneous port recently stopped functioning. Contrast study demonstrated small amount of extravasation from mid-catheter, indicating small fracture of catheter, and  removal of non-functioning Left subclavian central venous catheter with subcutaneous port was advised with insertion of a new central venous catheter with subcutaneous port. All risks, benefits, and alternatives to above elective procedures were discussed with the patient, who elected to proceed, and informed consent was accordingly obtained at that time.  DETAILS OF PROCEDURE:  Patient was brought to the operating suite and appropriately identified. In supine position, operative site was prepped and draped in the usual sterile fashion, and following a brief time out, mixed lidocaine and marcaine were injected in the subcutaneous tissue overlying the port, a 2 cm transverse linear incision was made and extended to 2.5 cm to accommodate the port. A combination of blunt dissection, sharp dissection, and electrocautery were used to free the port and attached catheter from its surrounding capsule and tissues. The port was then able to be removed from its subcutaneous pocket, the catheter was confirmed to slide freely, and the catheter was removed with immediate focal pressure applied to the venous insertion site for hemostasis. Hemostasis was then confirmed, the subcutaneous pocket was copiously irrigated, and the wound was then closed in layers, using 3-0 Vicryl buried interrupted suture to re-approximate dermis. Additional local anesthetic was injected subcutaneously along the length of the incision, and a running subcuticular 4-0 Vicryl suture was used to re-approximate epidermis. Skin was then cleaned, dried, and skin glue was applied.  Attention was then directed to insertion of Right IJ central venous catheter with subcutaneous port. In trendelenburg position, Right IJ venous access site was prepped and draped in the usual sterile fashion, and following a brief timeout, limited duplex evaluation of the Right internal jugular vein was performed. Percutaneous Right IJ venous access was obtained under ultrasound  guidance using Seldinger technique, by which local anesthetic was injected over the Right IJ vein, and access needle was inserted under direct ultrasound visualization into the Right IJ vein, through which soft guidewire was advanced,  over which access needle was withdrawn. Guidewire was secured, attention was directed to injection of local anesthetic along the planned tunnel site, 2-3 cm transverse Right chest incision was made and confirmed to accommodate the subcutaneous port, and flushed catheter was tunneled retrograde from the port site over the Right chest to the Right IJ access site with the attached port well-secured to the catheter and within the subcutaneous pocket. Insertion sheath was advanced over the guidewire, which was withdrawn along with the insertion sheath dilator. Length of catheter needed to position the catheter tip at the atrio-caval junction was then measured under direct fluoroscopic visualization, after which the catheter was cut to the measured length and advanced through the sheath into the Right internal jugular vein and SVC without evidence of cardiac arrhythmias during the procedure. Port was confirmed to withdraw blood and flush easily, after which concentrated heparin was instilled into the port and catheter. Dermis at the subcutaneous pocket was re-approximated using buried interrupted 3-0 Vicryl suture, and 4-0 Vicryl suture was used to re-approximate skin at the insertion and subcutaneous port sites in running subcuticular fashion for the subcutaneous port and buried interrupted fashion for the insertion site. Skin was cleaned, dried, and sterile skin glue was applied. Patient was then safely transferred to PACU for a chest x-ray.  I was present for all aspects of the procedures, and there were no intraprocedural complications apparent.

## 2017-01-18 NOTE — OR Nursing (Signed)
Pt swallowed gum in mouth on arrival, Dr.'s Ronelle Nigh and Rosana Hoes notified of same.

## 2017-01-18 NOTE — H&P (View-Only) (Signed)
Surgical Clinic History and Physical  Referring provider:  Remi Haggard, [place], [place] [address]  HISTORY OF PRESENT ILLNESS (HPI):  66 y.o. 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 Patient reports the port has not been working x a couple of weeks, denies any prior RUE swelling/edema or prior indwelling central venous catheter. Of note, patient is on chronic anticoagulation >1 year for a remote history of B/L lower extremity DVT's s/p remote placement of IVC filter. Patient otherwise denies any fever/chills, CP, or SOB.  PAST MEDICAL HISTORY (PMH):  Past Medical History:  Diagnosis Date  . Anxiety   . Breast cancer ([place]) 08/2015   right breast, chemo  . Cancer of right female breast ([place])   . Coronary artery disease    a. NSTEMI cath 08/02/14: LM nl, pLAD 50%, mLCx 30%, mRCA 95% s/p PCI/DES, 2nd lesion 40%, EF 55%  . Diabetes mellitus without complication ([place])   . DVT (deep venous thrombosis) ([facility])    LEFT LEG   . HLD (hyperlipidemia)   . Hypertension   . Lung cancer ([place])   . Lung nodule   . MI (myocardial infarction) ([place])   . Seizure disorder ([place])   . Seizures ([place])   . Squamous cell lung cancer ([place]) 04/23/2015     PAST SURGICAL HISTORY ([place]):  Past Surgical History:  Procedure Laterality Date  . ABDOMINAL HYSTERECTOMY     PARTIAL   . AXILLARY LYMPH NODE BIOPSY Right 05/26/2016   Procedure: AXILLARY LYMPH NODE BIOPSY;  Surgeon: Hubbard Robinson, MD;  Location: [facility] ORS;  Service: General;  Laterality: Right;  . BREAST BIOPSY Right 09/15/2015   positve  . CARDIAC CATHETERIZATION  08/02/2014  . CORONARY ANGIOPLASTY  08/02/2014   drug eluting stent  placement  . IVC FILTER PLACEMENT ([facility] HX)    . LEG SURGERY Right    BLOOD CLOT  . MASTECTOMY, PARTIAL Right 05/26/2016   Procedure: MASTECTOMY PARTIAL;  Surgeon: Hubbard Robinson, MD;  Location: [facility] ORS;  Service: General;  Laterality: Right;  . PORTACATH PLACEMENT Left 10/13/2015   Procedure: INSERTION PORT-A-CATH;  Surgeon: Hubbard Robinson, MD;  Location: [facility] ORS;  Service: General;  Laterality: Left;  . SENTINEL NODE BIOPSY Right 05/26/2016   Procedure: SENTINEL NODE BIOPSY;  Surgeon: Hubbard Robinson, MD;  Location: [facility] ORS;  Service: General;  Laterality: Right;     MEDICATIONS:  Prior to Admission medications   Medication Sig Start Date End Date Taking? Authorizing Provider  apixaban (ELIQUIS) 2.5 MG TABS tablet Take 1 tablet (2.5 mg total) by mouth 2 (two) times daily. Reported on 01/01/2016 12/20/16   Cammie Sickle, MD  Aspirin-Salicylamide-Caffeine [facility] HEADACHE POWDER PO) Take 1 packet by mouth daily.    [provider]  atorvastatin (LIPITOR) 40 MG tablet Take 1 tablet by mouth daily. 05/24/16   [provider]  diazepam (VALIUM) 5 MG tablet Take 1 tablet (5 mg total) by mouth 2 (two) times daily as needed. Reported on 12/16/2015 01/06/17   Cammie Sickle, MD  empagliflozin (JARDIANCE) 25 MG TABS tablet Take 25 mg by mouth daily.    [provider]  HYDROcodone-acetaminophen (NORCO/VICODIN) 5-325 MG tablet Take 1 tablet by mouth every 8 (eight) hours as  needed for moderate pain. 01/03/17   [name], [name], MD  lidocaine-prilocaine (EMLA) cream Apply 1 application topically as needed. Apply to port a cath site 1 hour before chemotherapy treatment. 12/14/16   [name], [name], MD  lisinopril-hydrochlorothiazide (PRINZIDE,ZESTORETIC) 20-25 MG per tablet Take 1 tablet by mouth daily.  07/26/14   [provider]  ondansetron (ZOFRAN ODT) 4 MG disintegrating tablet Take 1 tablet (4 mg total) by mouth every 8 (eight) hours as  needed for nausea or vomiting. Patient not taking: Reported on 01/03/2017 10/26/16   Stafford, Phillip, MD  phenytoin (DILANTIN) 100 MG ER capsule Take 300 mg by mouth daily.     [provider]  pioglitazone (ACTOS) 45 MG tablet Take 1 tablet by mouth daily. 01/28/15   [provider]  polyethylene glycol powder (MIRALAX) powder Take 255 g by mouth once. Take as directed 06/08/16 06/08/16  [name], [name], MD  VENTOLIN HFA 108 (90 Base) MCG/ACT inhaler Inhale 1 puff into the lungs every 6 (six) hours as needed. Reported on 01/20/2016 01/09/16   [provider]     ALLERGIES:  Allergies  Allergen Reactions  . Penicillins Swelling    rash     SOCIAL HISTORY:  Social History   Social History  . Marital status: Single    Spouse name: N/A  . Number of children: N/A  . Years of education: N/A   Occupational History  . Not on file.   Social History Main Topics  . Smoking status: Current Every Day Smoker    Packs/day: 1.00    Years: 45.00    Types: Cigarettes  . Smokeless tobacco: Never Used  . Alcohol use No  . Drug use: No  . Sexual activity: Yes    Partners: Male   Other Topics Concern  . Not on file   Social History Narrative  . No narrative on file    The patient currently resides (home / rehab facility / nursing home): Home  The patient normally is (ambulatory / bedbound) : Ambulatory   FAMILY HISTORY:  Family History  Problem Relation Age of Onset  . Heart attack Mother   . Breast cancer Sister 50       bilaterally breast cancer    Otherwise negative/non-contributory.  REVIEW OF SYSTEMS:  Constitutional: denies any other weight loss, fever, chills, or sweats  Eyes: denies any other vision changes, history of eye injury  ENT: denies sore throat, hearing problems  Respiratory: denies shortness of breath, wheezing  Cardiovascular: denies chest pain, palpitations  Gastrointestinal: denies abdominal pain, N/V, or  diarrhea Musculoskeletal: denies any other joint pains or cramps  Skin: Denies any other rashes or skin discolorations  Neurological: denies any other headache, dizziness, weakness  Psychiatric: Denies any other depression, anxiety   All other review of systems were otherwise negative   VITAL SIGNS:  BP 138/84   Pulse 87   Temp 98.3 F (36.8 C) (Oral)   Ht 5' 5" (1.651 m)   Wt 161 lb 8 oz (73.3 kg)   BMI 26.88 kg/m   PHYSICAL EXAM:  Constitutional:  -- Normal body habitus  -- Awake, alert, and oriented x3  Eyes:  -- Pupils equally round and reactive to light  -- No scleral icterus  Ear, nose, throat:  -- No jugular venous distension -- No nasal drainage, bleeding Pulmonary:  -- No crackles  -- Equal breath sounds bilaterally -- Breathing non-labored at rest Cardiovascular:  -- S1, S2 present  --   No pericardial rubs  Gastrointestinal:  -- Abdomen soft, nontender, nondistended, no guarding/rebound  -- No abdominal masses appreciated, pulsatile or otherwise  Musculoskeletal and Integumentary:  -- Wounds or skin discoloration: Left upper chest port site well-healed without erythema or drainage, Right chest post-lumpectomy and post-sentinel node excision axillary incisions well-healed without erythema or drainage -- Extremities: B/L UE and LE FROM, hands and feet warm, no edema  Neurologic:  -- Motor function: Intact and symmetric -- Sensation: Intact and symmetric  Labs:  CBC:  Lab Results  Component Value Date   WBC 4.4 01/10/2017   RBC 4.10 01/10/2017   BMP:  Lab Results  Component Value Date   GLUCOSE 161 (H) 01/10/2017   GLUCOSE 164 (H) 08/03/2014   CO2 28 01/10/2017   CO2 27 08/03/2014   BUN 40 (H) 01/10/2017   BUN 14 08/03/2014   CREATININE 1.01 (H) 01/10/2017   CREATININE 0.68 08/03/2014   CALCIUM 9.0 01/10/2017   CALCIUM 8.2 (L) 08/03/2014   Coagulation:  Lab Results  Component Value Date   INR 1.11 11/30/2016   INR 1.1 08/01/2014   APTT 24  11/30/2016   APTT 26.2 08/01/2014     Imaging studies:  Fluoroscopic Contrast Study of Left Subclavian Central Venous Catheter (01/07/2017) After obtaining informed consent the patient's Port-A-Cath was accessed by nursing. 5 cc of nonionic contrast administered. Contrast is noted to extravasated at the level of the midportion of the left subclavian limb of the catheter. This may be from a a tiny catheter fracture. The catheter appears to be intact with no displaced fragments noted. No contrast entered into the central venous system.   Assessment/Plan:  66 y.o. female with dysfunction of Left subclavian central venous catheter with subcutaneous port required for palliative Taxol chemotherapy for stage IV ER- PR- Her2- breast cancer with liver metastases and history of Left upper lobe squamous lung cancer, complicated by co-morbidities including DM, HTN, HLD, CAD s/p NSTEMI (2015), chronic anticoagulation for remote B/L lower extremity DVT's s/p remote placement of IVC filter, generalized anxiety disorder, and seizure disorder.   - patient instructed and acknowledges to not take Eliquis Sunday, 5/27 or Monday, 5/28  - if DVT's provoked and non-recurrent without specific hypercoagulopathy, may be able to check B/L lower extremity venous doppler and discontinue chronic anticoagulation if no DVT's   - all risks, benefits, and alternatives to removal of Left subclavian central venous catheter with subcutaneous port and placement of new central venous catheter with subcutaneous port (will plan to attempt Right IJ access first) were discussed with the patient, all of her questions were answered to her expressed satisfaction, patient expresses she wishes to proceed, and informed consent was accordingly obtained.  - will plan for removal of Left subclavian central venous catheter with subcutaneous port and placement of new central venous catheter with subcutaneous port this coming Tuesday, 5/29 prior to  patient's next scheduled chemotherapy administration Wednesday, 5/30  - return to clinic for post-procedural follow-up 2 weeks after above planned procedures   All of the above recommendations were discussed with the patient, and all of patient's questions were answered to her expressed satisfaction.  Thank you for the opportunity to participate in this patient's care.  -- Marilynne Drivers Rosana Hoes, MD, [place]: [facility] - Partnering for exceptional care. Office: [phone]

## 2017-01-18 NOTE — Progress Notes (Signed)
Patient would not allow discharge instructions to be Given to anyone except her, states she did the same Thing last time.  Patient given paperwork and Was appreciative, verbalized understanding.

## 2017-01-18 NOTE — Interval H&P Note (Signed)
History and Physical Interval Note:  01/18/2017 9:10 AM  Dawn Allen  has presented today for surgery, with the diagnosis of carcinoma of right breast  The various methods of treatment have been discussed with the patient and family. After consideration of risks, benefits and other options for treatment, the patient has consented to  Procedure(s): INSERTION PORT-A-CATH (N/A) REMOVAL PORT-A-CATH (N/A) as a surgical intervention .  The patient's history has been reviewed, patient examined, no change in status, stable for surgery.  I have reviewed the patient's chart and labs.  Questions were answered to the patient's satisfaction.     Vickie Epley

## 2017-01-18 NOTE — Discharge Instructions (Signed)
In addition to included general post-operative instructions for Insertion of Right Chemotherapy Port and Removal of Left Chemotherapy Port,  Diet: Resume home heart healthy diet.   Activity: No heavy lifting >20 pounds (children, pets, laundry, garbage) or strenuous activity until follow-up, but light activity and walking are encouraged. Do not drive or drink alcohol if taking narcotic pain medications.  Wound care: 2 days after surgery (Thursday, 5/31), may shower/get incision wet with soapy water and pat dry (do not rub incisions), but no baths or submerging incision underwater until follow-up.   Medications: Resume all home medications. Okay to resume anticoagulation 1 day after surgery as directed by primary medical physician. For mild to moderate pain: acetaminophen (Tylenol) or ibuprofen (if no kidney disease). Combining Tylenol with alcohol can substantially increase your risk of causing liver disease.  Call office [phone]) at any time if any questions, worsening pain, fevers/chills, bleeding, drainage from incision site, or other concerns.

## 2017-01-18 NOTE — OR Nursing (Signed)
Will start abx in OR per Lendon Colonel, RN

## 2017-01-18 NOTE — Telephone Encounter (Signed)
States she is unable to come in tomorrow secondary to port being placed today and having pain form that. Will call back tomorrow to reschedule

## 2017-01-19 ENCOUNTER — Inpatient Hospital Stay: Payer: Medicare Other | Admitting: Internal Medicine

## 2017-01-19 ENCOUNTER — Inpatient Hospital Stay: Payer: Medicare Other

## 2017-01-19 ENCOUNTER — Telehealth: Payer: Self-pay | Admitting: *Deleted

## 2017-01-19 ENCOUNTER — Encounter: Payer: Self-pay | Admitting: Surgery

## 2017-01-19 NOTE — Telephone Encounter (Signed)
Patient contacted cancer center requesting to r/s her chemotherapy treatment. States her port a cath site is still very tender and sore. States, "my port site is very bruised and the skin is very tender and seems puffy and swollen." pt states that the provider that inserted the port provided her with pain medication to use for pain at the port incision site. Patient declines chemotherapy tomorrow.  New apt provided for next Tuesday with [name] as patient declined to come in the rest of the week.

## 2017-01-20 ENCOUNTER — Ambulatory Visit: Payer: Medicare Other | Admitting: Surgery

## 2017-01-25 ENCOUNTER — Inpatient Hospital Stay: Payer: Medicare Other

## 2017-01-25 ENCOUNTER — Telehealth: Payer: Self-pay | Admitting: *Deleted

## 2017-01-25 ENCOUNTER — Inpatient Hospital Stay: Payer: Medicare Other | Admitting: Hematology and Oncology

## 2017-01-25 NOTE — Progress Notes (Deleted)
[place] OFFICE PROGRESS NOTE  Patient Care Team: Remi Haggard, FNP as PCP - General (Family Medicine)   SUMMARY OF ONCOLOGIC HISTORY:  Oncology History   # JAN 2017- Right Breast IDC;STAGE II T3 [5x7cm] N0 [right Ax Ln Bx- neg]; ER/PR/Her 2 NEG; carbo-Taxol w x10; May 5th [place]- Improving breast mass; OCT 5th- Lumpec & SLNBx [Dr.Loflin]- ypT3 [4m] ypN0 (sn) s/p RT.    # March 2018- PET liver lesion; April 2018- Liver bx- RECURRENT Breast cancer; ER/PR/Her-2 Neu-Pending.  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   # Smoker; ? Paranoia; Feb 2017- MUGA scan- 67%  # Molecular testing- MMR ordered- April 2018.      Carcinoma of overlapping sites of right breast in female, estrogen receptor negative ([facility]   INTERVAL HISTORY: Poor- vague historian- secondary to psychiatric illness.   [age]ear old female patient with above History of triple negative recurrent/ metastatic breast cancer to the liver currently on palliative chemotherapy with Taxol single agent is here for follow-up. Patient has so for 3 weekly treatments.  Denies any nausea vomiting. Denies any tingling or numbness. Complains of port malfunction pain. Difficulty in drawing blood/flushing.   Otherwise patient denies any unusual shortness of breath or chest pain or cough. Denies any tingling or numbness.   REVIEW OF SYSTEMS: Difficult to assess given as patient is a poor historian.  PAST MEDICAL HISTORY :  Past Medical History:  Diagnosis Date  . Anxiety   . Breast cancer ([facility]/2017   right breast, chemo  . Cancer of right female breast ([facility]  . Coronary artery disease    a. NSTEMI cath 08/02/14: LM nl, pLAD 50%, mLCx 30%, mRCA 95% s/p PCI/DES, 2nd lesion 40%, EF  55%  . Diabetes mellitus without complication ([facility]  . DVT (deep venous thrombosis) ([facility])    LEFT LEG   . HLD (hyperlipidemia)   . Hypertension   . Lung cancer ([facility]  . Lung nodule   . MI (myocardial infarction) ([facility]  . Seizure disorder ([facility]  . Seizures ([facility]  . Squamous cell lung cancer ([facility]/31/2016    PAST SURGICAL HISTORY :   Past Surgical History:  Procedure Laterality Date  . ABDOMINAL HYSTERECTOMY     PARTIAL   . AXILLARY LYMPH NODE BIOPSY Right 05/26/2016   Procedure: AXILLARY LYMPH NODE BIOPSY;  Surgeon: CaHubbard RobinsonMD;  Location: [facility] ORS;  Service: General;  Laterality: Right;  . BREAST BIOPSY Right 09/15/2015   positve  . CARDIAC CATHETERIZATION  08/02/2014  . CORONARY ANGIOPLASTY  08/02/2014   drug eluting stent placement  . IVC FILTER PLACEMENT ([facility] HX)    . LEG SURGERY Right    BLOOD CLOT  . MASTECTOMY, PARTIAL Right 05/26/2016   Procedure: MASTECTOMY PARTIAL;  Surgeon: CaHubbard RobinsonMD;  Location: [facility] ORS;  Service: General;  Laterality: Right;  . PORT-A-CATH REMOVAL Left 01/18/2017   Procedure: REMOVAL PORT-A-CATH;  Surgeon: DaVickie EpleyMD;  Location: [facility] ORS;  Service: General;  Laterality: Left;  . PORTACATH PLACEMENT Left 10/13/2015   Procedure: INSERTION PORT-A-CATH;  Surgeon: CaHubbard RobinsonMD;  Location: [facility] OR[facility];  Service: General;  Laterality: Left;  . PORTACATH PLACEMENT  Right 01/18/2017   Procedure: INSERTION PORT-A-CATH;  Surgeon: Ancil Linsey, MD;  Location: [facility] OR[facility];  Service: General;  Laterality: Right;  . SENTINEL NODE BIOPSY Right 05/26/2016   Procedure: SENTINEL NODE BIOPSY;  Surgeon: Gladis Riffle, MD;  Location: [facility] OR[facility];  Service: General;  Laterality: Right;    FAMILY HISTORY :   Family History  Problem Relation Age of Onset  . Heart attack Mother   . Breast cancer Sister 92       bilaterally breast cancer    SOCIAL HISTORY:   Social History  Substance Use Topics  . Smoking status:  Current Every Day Smoker    Packs/day: 1.00    Years: 45.00    Types: Cigarettes  . Smokeless tobacco: Never Used  . Alcohol use No    ALLERGIES:  is allergic to penicillins.  MEDICATIONS:  Current Outpatient Prescriptions  Medication Sig Dispense Refill  . apixaban (ELIQUIS) 2.5 MG TABS tablet Take 1 tablet (2.5 mg total) by mouth 2 (two) times daily. Reported on 01/01/2016 60 tablet 6  . Aspirin-Salicylamide-Caffeine (BC HEADACHE POWDER PO) Take 1 packet by mouth daily.    Marland Kitchen atorvastatin (LIPITOR) 40 MG tablet Take 1 tablet by mouth daily.    . diazepam (VALIUM) 5 MG tablet Take 1 tablet (5 mg total) by mouth 2 (two) times daily as needed. Reported on 12/16/2015 60 tablet 0  . empagliflozin (JARDIANCE) 25 MG TABS tablet Take 25 mg by mouth daily.    Marland Kitchen HYDROcodone-acetaminophen (NORCO/VICODIN) 5-325 MG tablet Take 1 tablet by mouth every 8 (eight) hours as needed for moderate pain. 40 tablet 0  . ibuprofen (ADVIL,MOTRIN) 600 MG tablet Take 1 tablet (600 mg total) by mouth every 6 (six) hours as needed for moderate pain. 30 tablet 0  . lidocaine-prilocaine (EMLA) cream Apply 1 application topically as needed. Apply to port a cath site 1 hour before chemotherapy treatment. 30 g 6  . lisinopril-hydrochlorothiazide (PRINZIDE,ZESTORETIC) 20-25 MG per tablet Take 1 tablet by mouth daily.     . ondansetron (ZOFRAN ODT) 4 MG disintegrating tablet Take 1 tablet (4 mg total) by mouth every 8 (eight) hours as needed for nausea or vomiting. (Patient not taking: Reported on 01/03/2017) 20 tablet 0  . phenytoin (DILANTIN) 100 MG ER capsule Take 300 mg by mouth daily.     . pioglitazone (ACTOS) 45 MG tablet Take 1 tablet by mouth daily.    . polyethylene glycol powder (MIRALAX) powder Take 255 g by mouth once. Take as directed 255 g 0  . VENTOLIN HFA 108 (90 Base) MCG/ACT inhaler Inhale 1 puff into the lungs every 6 (six) hours as needed. Reported on 01/20/2016     No current facility-administered  medications for this visit.    Facility-Administered Medications Ordered in Other Visits  Medication Dose Route Frequency Provider Last Rate Last Dose  . albuterol (PROVENTIL) (2.5 MG/3ML) 0.083% nebulizer solution 2.5 mg  2.5 mg Nebulization Once Hulda Marin, MD      . sodium chloride flush (NS) 0.9 % injection 10 mL  10 mL Intravenous PRN Earna Coder, MD   10 mL at 12/01/15 0850    PHYSICAL EXAMINATION: ECOG PERFORMANCE STATUS: 0 - Asymptomatic  There were no vitals taken for this visit.  There were no vitals filed for this visit.  GENERAL: Well-nourished well-developed; Alert, no distress and comfortable.She is alone. EYES: no pallor or icterus OROPHARYNX: no thrush or ulceration.   NECK: supple, no masses felt LYMPH: no  palpable lymphadenopathy in the cervical, axillary or inguinal regions LUNGS: clear to auscultation and No wheeze or crackles HEART/CVS: regular rate & rhythm and no murmurs; No lower extremity edema ABDOMEN:abdomen soft, non-tender and normal bowel sounds; site of liver biopsy well-healed. No infection. Musculoskeletal:no cyanosis of digits and no clubbing  PSYCH: alert & oriented x 3; anxious.  NEURO: no focal motor/sensory deficits Skin- normal limits.  LABORATORY DATA:  I have reviewed the data as listed    Component Value Date/Time   NA 135 01/10/2017 0900   NA 141 08/03/2014 0050   K 4.1 01/10/2017 0900   K 3.3 (L) 08/03/2014 0050   CL 100 (L) 01/10/2017 0900   CL 108 (H) 08/03/2014 0050   CO2 28 01/10/2017 0900   CO2 27 08/03/2014 0050   GLUCOSE 161 (H) 01/10/2017 0900   GLUCOSE 164 (H) 08/03/2014 0050   BUN 40 (H) 01/10/2017 0900   BUN 14 08/03/2014 0050   CREATININE 1.01 (H) 01/10/2017 0900   CREATININE 0.68 08/03/2014 0050   CALCIUM 9.0 01/10/2017 0900   CALCIUM 8.2 (L) 08/03/2014 0050   PROT 7.4 01/10/2017 0900   PROT 6.7 04/11/2013 2241   ALBUMIN 3.5 01/10/2017 0900   ALBUMIN 2.7 (L) 04/11/2013 2241   AST 79 (H)  01/10/2017 0900   AST 13 (L) 04/11/2013 2241   ALT 59 (H) 01/10/2017 0900   ALT 16 04/11/2013 2241   ALKPHOS 279 (H) 01/10/2017 0900   ALKPHOS 207 (H) 04/11/2013 2241   BILITOT 0.3 01/10/2017 0900   BILITOT 0.1 (L) 04/11/2013 2241   GFRNONAA 57 (L) 01/10/2017 0900   GFRNONAA >60 08/03/2014 0050   GFRNONAA >60 04/11/2013 2241   GFRAA >60 01/10/2017 0900   GFRAA >60 08/03/2014 0050   GFRAA >60 04/11/2013 2241    No results found for: SPEP, UPEP  Lab Results  Component Value Date   WBC 4.4 01/10/2017   NEUTROABS 2.7 01/10/2017   HGB 12.1 01/10/2017   HCT 35.3 01/10/2017   MCV 86.0 01/10/2017   PLT 173 01/10/2017      Chemistry      Component Value Date/Time   NA 135 01/10/2017 0900   NA 141 08/03/2014 0050   K 4.1 01/10/2017 0900   K 3.3 (L) 08/03/2014 0050   CL 100 (L) 01/10/2017 0900   CL 108 (H) 08/03/2014 0050   CO2 28 01/10/2017 0900   CO2 27 08/03/2014 0050   BUN 40 (H) 01/10/2017 0900   BUN 14 08/03/2014 0050   CREATININE 1.01 (H) 01/10/2017 0900   CREATININE 0.68 08/03/2014 0050      Component Value Date/Time   CALCIUM 9.0 01/10/2017 0900   CALCIUM 8.2 (L) 08/03/2014 0050   ALKPHOS 279 (H) 01/10/2017 0900   ALKPHOS 207 (H) 04/11/2013 2241   AST 79 (H) 01/10/2017 0900   AST 13 (L) 04/11/2013 2241   ALT 59 (H) 01/10/2017 0900   ALT 16 04/11/2013 2241   BILITOT 0.3 01/10/2017 0900   BILITOT 0.1 (L) 04/11/2013 2241        ASSESSMENT & PLAN:   Carcinoma of overlapping sites of right breast in female, estrogen receptor negative ([place]) # Recurrent/metastatic breast cancer- biopsy-proven liver lesion. TRIPLE NEGATIVE.  Currently on palliative chemo on -Taxol weekly s/p cycle #1 [3 weekly treatments]  # proceed with cycle # 2 day-1 Labs today reviewed;  acceptable for treatment today- except for slightly elevated LFTs [C discussion below]  # Chronic pain/and pain from breast surgery- continue hydrocodone1  every 8 hours as needed. New prescription  given.  # PORT malfunction- will plan to get dye study.   # LFTs- elevated G-1; ? Taxol- monitor for now. HOLD lipitor.   # anxiety/ insomnia- on valium 5 mg BID when necessary.   # DVT bil- on Elquis; continue the same.   # Paranoia/psychiatric illness- declines evaluation.   #  Chemotherapy in 1 week/labs; follow-up with me in 2 weeks/lab [[place]].      Lequita Asal, MD 01/25/2017 4:22 AM

## 2017-01-25 NOTE — Telephone Encounter (Signed)
fyi

## 2017-01-25 NOTE — Telephone Encounter (Signed)
per [name] driver, pt refused to come to clinic today "states she is sick."  Msg sent to sch. Team in cancer center to r/s the patient.

## 2017-01-28 ENCOUNTER — Telehealth: Payer: Self-pay | Admitting: *Deleted

## 2017-01-28 NOTE — Telephone Encounter (Signed)
pt's lab/md/chemo have been r/s for 6/14. Pt must be ready for [name] pick up @ 10:15.  Unable to leave msg for pt- phone rings "unavailable."

## 2017-02-02 ENCOUNTER — Telehealth: Payer: Self-pay | Admitting: *Deleted

## 2017-02-02 NOTE — Telephone Encounter (Signed)
Per Co[place] in cancer center scheduling team. Pt called and confirmed apts with lab/md/chemo tomorrow.

## 2017-02-03 ENCOUNTER — Encounter: Payer: Self-pay | Admitting: Hematology and Oncology

## 2017-02-03 ENCOUNTER — Inpatient Hospital Stay (HOSPITAL_BASED_OUTPATIENT_CLINIC_OR_DEPARTMENT_OTHER): Payer: Medicare Other | Admitting: Hematology and Oncology

## 2017-02-03 ENCOUNTER — Inpatient Hospital Stay: Payer: Medicare Other

## 2017-02-03 ENCOUNTER — Inpatient Hospital Stay: Payer: Medicare Other | Attending: Hematology and Oncology

## 2017-02-03 VITALS — BP 117/74 | HR 94 | Temp 97.2°F | Resp 18 | Wt 151.0 lb

## 2017-02-03 DIAGNOSIS — R17 Unspecified jaundice: Secondary | ICD-10-CM | POA: Insufficient documentation

## 2017-02-03 DIAGNOSIS — E119 Type 2 diabetes mellitus without complications: Secondary | ICD-10-CM

## 2017-02-03 DIAGNOSIS — Z87891 Personal history of nicotine dependence: Secondary | ICD-10-CM | POA: Insufficient documentation

## 2017-02-03 DIAGNOSIS — Z171 Estrogen receptor negative status [ER-]: Secondary | ICD-10-CM

## 2017-02-03 DIAGNOSIS — I252 Old myocardial infarction: Secondary | ICD-10-CM | POA: Insufficient documentation

## 2017-02-03 DIAGNOSIS — C787 Secondary malignant neoplasm of liver and intrahepatic bile duct: Secondary | ICD-10-CM

## 2017-02-03 DIAGNOSIS — F419 Anxiety disorder, unspecified: Secondary | ICD-10-CM

## 2017-02-03 DIAGNOSIS — F17210 Nicotine dependence, cigarettes, uncomplicated: Secondary | ICD-10-CM

## 2017-02-03 DIAGNOSIS — Z9221 Personal history of antineoplastic chemotherapy: Secondary | ICD-10-CM | POA: Diagnosis not present

## 2017-02-03 DIAGNOSIS — Z923 Personal history of irradiation: Secondary | ICD-10-CM | POA: Insufficient documentation

## 2017-02-03 DIAGNOSIS — E876 Hypokalemia: Secondary | ICD-10-CM | POA: Diagnosis not present

## 2017-02-03 DIAGNOSIS — Z79899 Other long term (current) drug therapy: Secondary | ICD-10-CM | POA: Diagnosis not present

## 2017-02-03 DIAGNOSIS — Z7901 Long term (current) use of anticoagulants: Secondary | ICD-10-CM | POA: Insufficient documentation

## 2017-02-03 DIAGNOSIS — G40909 Epilepsy, unspecified, not intractable, without status epilepticus: Secondary | ICD-10-CM | POA: Diagnosis not present

## 2017-02-03 DIAGNOSIS — C50811 Malignant neoplasm of overlapping sites of right female breast: Secondary | ICD-10-CM | POA: Diagnosis not present

## 2017-02-03 DIAGNOSIS — E785 Hyperlipidemia, unspecified: Secondary | ICD-10-CM | POA: Diagnosis not present

## 2017-02-03 DIAGNOSIS — I2510 Atherosclerotic heart disease of native coronary artery without angina pectoris: Secondary | ICD-10-CM | POA: Insufficient documentation

## 2017-02-03 DIAGNOSIS — Z86718 Personal history of other venous thrombosis and embolism: Secondary | ICD-10-CM | POA: Insufficient documentation

## 2017-02-03 DIAGNOSIS — R160 Hepatomegaly, not elsewhere classified: Secondary | ICD-10-CM

## 2017-02-03 DIAGNOSIS — Z95828 Presence of other vascular implants and grafts: Secondary | ICD-10-CM

## 2017-02-03 DIAGNOSIS — G893 Neoplasm related pain (acute) (chronic): Secondary | ICD-10-CM

## 2017-02-03 DIAGNOSIS — M898X9 Other specified disorders of bone, unspecified site: Secondary | ICD-10-CM

## 2017-02-03 DIAGNOSIS — I10 Essential (primary) hypertension: Secondary | ICD-10-CM | POA: Diagnosis not present

## 2017-02-03 DIAGNOSIS — K5900 Constipation, unspecified: Secondary | ICD-10-CM | POA: Insufficient documentation

## 2017-02-03 DIAGNOSIS — R748 Abnormal levels of other serum enzymes: Secondary | ICD-10-CM

## 2017-02-03 DIAGNOSIS — E871 Hypo-osmolality and hyponatremia: Secondary | ICD-10-CM | POA: Diagnosis not present

## 2017-02-03 LAB — CBC WITH DIFFERENTIAL/PLATELET
BASOS ABS: 0 10*3/uL (ref 0–0.1)
Basophils Relative: 0 %
Eosinophils Absolute: 0.2 10*3/uL (ref 0–0.7)
Eosinophils Relative: 4 %
HEMATOCRIT: 38.1 % (ref 35.0–47.0)
HEMOGLOBIN: 13 g/dL (ref 12.0–16.0)
LYMPHS ABS: 1.5 10*3/uL (ref 1.0–3.6)
LYMPHS PCT: 25 %
MCH: 29.1 pg (ref 26.0–34.0)
MCHC: 34.2 g/dL (ref 32.0–36.0)
MCV: 85.1 fL (ref 80.0–100.0)
MONOS PCT: 6 %
Monocytes Absolute: 0.4 10*3/uL (ref 0.2–0.9)
NEUTROS PCT: 65 %
Neutro Abs: 3.8 10*3/uL (ref 1.4–6.5)
Platelets: 191 10*3/uL (ref 150–440)
RBC: 4.48 MIL/uL (ref 3.80–5.20)
RDW: 18.7 % — AB (ref 11.5–14.5)
WBC: 5.9 10*3/uL (ref 3.6–11.0)

## 2017-02-03 LAB — COMPREHENSIVE METABOLIC PANEL
ALBUMIN: 3 g/dL — AB (ref 3.5–5.0)
ALT: 197 U/L — AB (ref 14–54)
AST: 170 U/L — AB (ref 15–41)
Alkaline Phosphatase: 1085 U/L — ABNORMAL HIGH (ref 38–126)
Anion gap: 10 (ref 5–15)
BILIRUBIN TOTAL: 5.8 mg/dL — AB (ref 0.3–1.2)
BUN: 31 mg/dL — AB (ref 6–20)
CO2: 28 mmol/L (ref 22–32)
Calcium: 8.7 mg/dL — ABNORMAL LOW (ref 8.9–10.3)
Chloride: 90 mmol/L — ABNORMAL LOW (ref 101–111)
Creatinine, Ser: 0.96 mg/dL (ref 0.44–1.00)
GFR calc Af Amer: >60 mL/min (ref >60)
GFR calc non Af Amer: >60 mL/min (ref >60)
GLUCOSE: 229 mg/dL — AB (ref 65–99)
POTASSIUM: 2.8 mmol/L — AB (ref 3.5–5.1)
Sodium: 128 mmol/L — ABNORMAL LOW (ref 135–145)
Total Protein: 7.6 g/dL (ref 6.5–8.1)

## 2017-02-03 MED ORDER — SODIUM CHLORIDE 0.9% FLUSH
10.0000 mL | INTRAVENOUS | Status: DC | PRN
  Administered 2017-02-03: 10 mL via INTRAVENOUS
  Filled 2017-02-03: qty 10

## 2017-02-03 MED ORDER — HEPARIN SOD (PORK) LOCK FLUSH 100 UNIT/ML IV SOLN
500.0000 [IU] | Freq: Once | INTRAVENOUS | Status: AC
  Administered 2017-02-03: 500 [IU] via INTRAVENOUS
  Filled 2017-02-03: qty 5

## 2017-02-03 MED ORDER — POTASSIUM CHLORIDE CRYS ER 20 MEQ PO TBCR: 20.0000 meq | EXTENDED_RELEASE_TABLET | Freq: Every day | ORAL | 0 refills | Status: DC

## 2017-02-03 MED ORDER — SODIUM CHLORIDE 0.9 % IV SOLN
INTRAVENOUS | Status: DC
  Administered 2017-02-03: 13:00:00 via INTRAVENOUS
  Filled 2017-02-03 (×2): qty 500

## 2017-02-03 MED ORDER — HYDROCODONE-ACETAMINOPHEN 5-325 MG PO TABS: 1.0000 | ORAL_TABLET | Freq: Four times a day (QID) | ORAL | 0 refills | Status: DC | PRN

## 2017-02-03 NOTE — Progress Notes (Signed)
[place] OFFICE PROGRESS NOTE  Patient Care Team: Remi Haggard, FNP as PCP - General (Family Medicine)   SUMMARY OF ONCOLOGIC HISTORY:  Oncology History   # JAN 2017- Right Breast IDC;STAGE II T3 [5x7cm] N0 [right Ax Ln Bx- neg]; ER/PR/Her 2 NEG; carbo-Taxol w x10; May 5th [place]- Improving breast mass; OCT 5th- Lumpec & SLNBx [Dr.Loflin]- ypT3 [24m] ypN0 (sn) s/p RT.    # March 2018- PET liver lesion; April 2018- Liver bx- RECURRENT Breast cancer; ER/PR/Her-2 Neu-Pending.  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   # Smoker; ? Paranoia; Feb 2017- MUGA scan- 67%  # Molecular testing- MMR ordered- April 2018.      Carcinoma of overlapping sites of right breast in female, estrogen receptor negative ([facility]   INTERVAL HISTORY: Poor- vague historian- secondary to psychiatric illness.   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 Patient has so for 3 weekly treatments.   The patient was last seen by Dr. BrRogue Bussingn  01/03/2017. She notes issues with her port not working on 01/18/2017. She states that then "everything went wrong". She's been taking Motrin. She notes pain under her left arm.  She notes discomfort across her chest.  She also notes a pain in the small of her back up to her shoulders especially when she sits or lies down. She describes being "too tired". She is not getting much sleep. She has constipation. MiraLAX is not working. She is taking BC powders.  REVIEW OF SYSTEMS: Difficult to assess given as patient is a poor historian.  PAST MEDICAL HISTORY :  Past Medical  History:  Diagnosis Date  . Anxiety   . Breast cancer ([facility]1/2017   right breast, chemo  . Cancer of right female breast ([facility]  . Coronary artery disease    a. NSTEMI cath 08/02/14: LM nl, pLAD 50%, mLCx 30%, mRCA 95% s/p PCI/DES, 2nd lesion 40%, EF 55%  . Diabetes mellitus without complication ([facility]  . DVT (deep venous thrombosis) (H[facility])    LEFT LEG   . HLD (hyperlipidemia)   . Hypertension   . Lung cancer ([facility]  . Lung nodule   . MI (myocardial infarction) ([facility]  . Seizure disorder ([facility]  . Seizures ([facility]  . Squamous cell lung cancer ([facility] [place]8/[place]/2016    PAST SURGICAL HISTORY :   Past Surgical History:  Procedure Laterality Date  . ABDOMINAL HYSTERECTOMY     PARTIAL   . AXILLARY LYMPH NODE BIOPSY Right 05/26/2016   Procedure: AXILLARY LYMPH NODE BIOPSY;  Surgeon: CaHubbard RobinsonMD;  Location: [facility] ORS;  Service: General;  Laterality: Right;  . BREAST BIOPSY Right 09/15/2015   positve  . CARDIAC CATHETERIZATION  08/02/2014  . CORONARY ANGIOPLASTY  08/02/2014   drug eluting stent placement  . IVC FILTER PLACEMENT ([facility] HX)    . LEG SURGERY Right    BLOOD CLOT  . MASTECTOMY, PARTIAL Right 05/26/2016   Procedure: MASTECTOMY PARTIAL;  Surgeon: CaHubbard RobinsonMD;  Location: [facility] ORS;  Service: General;  Laterality: Right;  . PORT-A-CATH REMOVAL  Left 01/18/2017   Procedure: REMOVAL PORT-A-CATH;  Surgeon: Vickie Epley, MD;  Location: [facility] ORS;  Service: General;  Laterality: Left;  . PORTACATH PLACEMENT Left 10/13/2015   Procedure: INSERTION PORT-A-CATH;  Surgeon: Hubbard Robinson, MD;  Location: [facility] ORS;  Service: General;  Laterality: Left;  . PORTACATH PLACEMENT Right 01/18/2017   Procedure: INSERTION PORT-A-CATH;  Surgeon: Vickie Epley, MD;  Location: [facility] ORS;  Service: General;  Laterality: Right;  . SENTINEL NODE BIOPSY Right 05/26/2016   Procedure: SENTINEL NODE BIOPSY;  Surgeon: Hubbard Robinson, MD;  Location: [facility] ORS;  Service: General;   Laterality: Right;    FAMILY HISTORY :   Family History  Problem Relation Age of Onset  . Heart attack Mother   . Breast cancer Sister 69       bilaterally breast cancer    SOCIAL HISTORY:   Social History  Substance Use Topics  . Smoking status: Current Every Day Smoker    Packs/day: 1.00    Years: 45.00    Types: Cigarettes  . Smokeless tobacco: Never Used  . Alcohol use No    ALLERGIES:  is allergic to penicillins.  MEDICATIONS:  Current Outpatient Prescriptions  Medication Sig Dispense Refill  . apixaban (ELIQUIS) 2.5 MG TABS tablet Take 1 tablet (2.5 mg total) by mouth 2 (two) times daily. Reported on 01/01/2016 60 tablet 6  . Aspirin-Salicylamide-Caffeine (BC HEADACHE POWDER PO) Take 1 packet by mouth daily.    . diazepam (VALIUM) 5 MG tablet Take 1 tablet (5 mg total) by mouth 2 (two) times daily as needed. Reported on 12/16/2015 60 tablet 0  . empagliflozin (JARDIANCE) 25 MG TABS tablet Take 25 mg by mouth daily.    Marland Kitchen HYDROcodone-acetaminophen (NORCO/VICODIN) 5-325 MG tablet Take 1 tablet by mouth every 8 (eight) hours as needed for moderate pain. 40 tablet 0  . ibuprofen (ADVIL,MOTRIN) 600 MG tablet Take 1 tablet (600 mg total) by mouth every 6 (six) hours as needed for moderate pain. 30 tablet 0  . lidocaine-prilocaine (EMLA) cream Apply 1 application topically as needed. Apply to port a cath site 1 hour before chemotherapy treatment. 30 g 6  . lisinopril-hydrochlorothiazide (PRINZIDE,ZESTORETIC) 20-25 MG per tablet Take 1 tablet by mouth daily.     . phenytoin (DILANTIN) 100 MG ER capsule Take 300 mg by mouth daily.     . pioglitazone (ACTOS) 45 MG tablet Take 1 tablet by mouth daily.    . VENTOLIN HFA 108 (90 Base) MCG/ACT inhaler Inhale 1 puff into the lungs every 6 (six) hours as needed. Reported on 01/20/2016    . atorvastatin (LIPITOR) 40 MG tablet Take 1 tablet by mouth daily.    Marland Kitchen lactulose (CHRONULAC) 10 GM/15ML solution     . nitroGLYCERIN (NITROSTAT) 0.4 MG  SL tablet Place 0.4 mg under the tongue as needed.    . ondansetron (ZOFRAN ODT) 4 MG disintegrating tablet Take 1 tablet (4 mg total) by mouth every 8 (eight) hours as needed for nausea or vomiting. (Patient not taking: Reported on 01/03/2017) 20 tablet 0  . polyethylene glycol powder (MIRALAX) powder Take 255 g by mouth once. Take as directed 255 g 0  . TRADJENTA 5 MG TABS tablet Take 5 mg by mouth daily.     No current facility-administered medications for this visit.    Facility-Administered Medications Ordered in Other Visits  Medication Dose Route Frequency Provider Last Rate Last Dose  . albuterol (PROVENTIL) (2.5 MG/3ML) 0.083% nebulizer solution 2.5 mg  2.5 mg Nebulization Once Nestor Lewandowsky, MD      . heparin lock flush 100 unit/mL  500 Units Intravenous Once Charlaine Dalton R, MD      . sodium chloride flush (NS) 0.9 % injection 10 mL  10 mL Intravenous PRN Cammie Sickle, MD   10 mL at 12/01/15 0850  . sodium chloride flush (NS) 0.9 % injection 10 mL  10 mL Intravenous PRN Cammie Sickle, MD   10 mL at 02/03/17 1044    PHYSICAL EXAMINATION: ECOG PERFORMANCE STATUS: 0 - Asymptomatic  BP 117/74 (BP Location: Left Arm, Patient Position: Sitting)   Pulse 94   Temp 97.2 F (36.2 C) (Tympanic)   Resp 18   Wt 151 lb (68.5 kg)   BMI 25.13 kg/m   Filed Weights   02/03/17 1128  Weight: 151 lb (68.5 kg)    GENERAL:  Well developed, well nourished, woman sitting comfortably in the exam room in no acute distress.  She is accompanied by Tanya Nones, nurse navigator. MENTAL STATUS:  Alert and oriented to person, place and time. HEAD:  Long brown hair.  Normocephalic, atraumatic, face symmetric, no Cushingoid features. EYES:  Brown eyes.  Scleral icterus.  Pupils equal round and reactive to light and accomodation.  No conjunctivitis. ENT:  Oropharynx clear without lesion.  Tongue normal. Mucous membranes moist.  RESPIRATORY:  Clear to auscultation without rales,  wheezes or rhonchi. CARDIOVASCULAR:  Regular rate and rhythm without murmur, rub or gallop. ABDOMEN:  Soft, non-tender, with active bowel sounds.  Hepatomegaly.  No splenomegaly.  No masses. Back:  Pain on palpation including left iliac crest. SKIN:  No rashes, ulcers or lesions. EXTREMITIES: No edema, no skin discoloration or tenderness.  No palpable cords. LYMPH NODES: No palpable cervical, supraclavicular, axillary or inguinal adenopathy  NEUROLOGICAL: Unremarkable. PSYCH:  Appropriate.    LABORATORY DATA:  I have reviewed the data as listed    Component Value Date/Time   NA 128 (L) 02/03/2017 1035   NA 141 08/03/2014 0050   K 2.8 (L) 02/03/2017 1035   K 3.3 (L) 08/03/2014 0050   CL 90 (L) 02/03/2017 1035   CL 108 (H) 08/03/2014 0050   CO2 28 02/03/2017 1035   CO2 27 08/03/2014 0050   GLUCOSE 229 (H) 02/03/2017 1035   GLUCOSE 164 (H) 08/03/2014 0050   BUN 31 (H) 02/03/2017 1035   BUN 14 08/03/2014 0050   CREATININE 0.96 02/03/2017 1035   CREATININE 0.68 08/03/2014 0050   CALCIUM 8.7 (L) 02/03/2017 1035   CALCIUM 8.2 (L) 08/03/2014 0050   PROT 7.6 02/03/2017 1035   PROT 6.7 04/11/2013 2241   ALBUMIN 3.0 (L) 02/03/2017 1035   ALBUMIN 2.7 (L) 04/11/2013 2241   AST 170 (H) 02/03/2017 1035   AST 13 (L) 04/11/2013 2241   ALT 197 (H) 02/03/2017 1035   ALT 16 04/11/2013 2241   ALKPHOS 1,085 (H) 02/03/2017 1035   ALKPHOS 207 (H) 04/11/2013 2241   BILITOT 5.8 (H) 02/03/2017 1035   BILITOT 0.1 (L) 04/11/2013 2241   GFRNONAA >60 02/03/2017 1035   GFRNONAA >60 08/03/2014 0050   GFRNONAA >60 04/11/2013 2241   GFRAA >60 02/03/2017 1035   GFRAA >60 08/03/2014 0050   GFRAA >60 04/11/2013 2241    No results found for: SPEP, UPEP  Lab Results  Component Value Date   WBC 5.9 02/03/2017   NEUTROABS 3.8 02/03/2017   HGB 13.0 02/03/2017   HCT 38.1 02/03/2017   MCV 85.1 02/03/2017  PLT 191 02/03/2017      Chemistry      Component Value Date/Time   NA 128 (L) 02/03/2017  1035   NA 141 08/03/2014 0050   K 2.8 (L) 02/03/2017 1035   K 3.3 (L) 08/03/2014 0050   CL 90 (L) 02/03/2017 1035   CL 108 (H) 08/03/2014 0050   CO2 28 02/03/2017 1035   CO2 27 08/03/2014 0050   BUN 31 (H) 02/03/2017 1035   BUN 14 08/03/2014 0050   CREATININE 0.96 02/03/2017 1035   CREATININE 0.68 08/03/2014 0050      Component Value Date/Time   CALCIUM 8.7 (L) 02/03/2017 1035   CALCIUM 8.2 (L) 08/03/2014 0050   ALKPHOS 1,085 (H) 02/03/2017 1035   ALKPHOS 207 (H) 04/11/2013 2241   AST 170 (H) 02/03/2017 1035   AST 13 (L) 04/11/2013 2241   ALT 197 (H) 02/03/2017 1035   ALT 16 04/11/2013 2241   BILITOT 5.8 (H) 02/03/2017 1035   BILITOT 0.1 (L) 04/11/2013 2241        ASSESSMENT & PLAN:   Carcinoma of overlapping sites of right breast in female, estrogen receptor negative ([place]) # Recurrent/metastatic breast cancer- biopsy-proven liver lesion. TRIPLE NEGATIVE.  Currently on palliative chemo on -Taxol weekly s/p cycle #1 [3 weekly treatments]  # Discuss multitude of laboratory abnormalities.  Discuss concern for progressive disease in bone and liver.  She is jaundiced today.  She has bone pain.  Discuss expedited evaluation in the hospital.  She may have biliary obstruction requiring stent placement.  Discuss controlling pain better.    Discuss electrolyte abnormalities.  Discuss issues with hyponatremia and hypokalemia.  Discuss IVF with electrolytes.  She adamantly refused admission to the hospital.  Patient hesitant to proceed with any treatment or study.  She initially wanted to receive her chemotherapy and leave.  I dicussed that she could not receive Taxol because of her abnormal liver function tests and the likelihood that her current chemotherapy was not working.  Patient states that she could only do one thing at a time.  Patient was agreeable to proceeding slowly with evaluation.  No chemo today IVF + 20KCL  Abdominal CT STAT Bone scan ASAP  RTC after  imaging  Addendum:  Patient refused to return after her CT scan.   Lequita Asal, MD 02/03/2017 12:02 PM

## 2017-02-03 NOTE — Progress Notes (Signed)
Patient of Dr. Jacinto Reap.  States she is in pain always.  Does not have much of an appetite.  Sometimes does not have BM for 2 weeks.

## 2017-02-04 ENCOUNTER — Encounter: Payer: Self-pay | Admitting: *Deleted

## 2017-02-04 ENCOUNTER — Ambulatory Visit
Admission: RE | Admit: 2017-02-04 | Discharge: 2017-02-04 | Disposition: A | Discharge status: Home / Self Care | Payer: Medicare Other | Source: Ambulatory Visit | Attending: Hematology and Oncology | Admitting: Hematology and Oncology

## 2017-02-04 DIAGNOSIS — C7971 Secondary malignant neoplasm of right adrenal gland: Secondary | ICD-10-CM | POA: Diagnosis not present

## 2017-02-04 DIAGNOSIS — C50811 Malignant neoplasm of overlapping sites of right female breast: Secondary | ICD-10-CM | POA: Diagnosis present

## 2017-02-04 DIAGNOSIS — G893 Neoplasm related pain (acute) (chronic): Secondary | ICD-10-CM | POA: Insufficient documentation

## 2017-02-04 DIAGNOSIS — R748 Abnormal levels of other serum enzymes: Secondary | ICD-10-CM | POA: Insufficient documentation

## 2017-02-04 DIAGNOSIS — N281 Cyst of kidney, acquired: Secondary | ICD-10-CM | POA: Diagnosis not present

## 2017-02-04 DIAGNOSIS — C7972 Secondary malignant neoplasm of left adrenal gland: Secondary | ICD-10-CM | POA: Insufficient documentation

## 2017-02-04 DIAGNOSIS — Z923 Personal history of irradiation: Secondary | ICD-10-CM | POA: Diagnosis not present

## 2017-02-04 DIAGNOSIS — C772 Secondary and unspecified malignant neoplasm of intra-abdominal lymph nodes: Secondary | ICD-10-CM | POA: Insufficient documentation

## 2017-02-04 DIAGNOSIS — M898X9 Other specified disorders of bone, unspecified site: Secondary | ICD-10-CM

## 2017-02-04 DIAGNOSIS — J90 Pleural effusion, not elsewhere classified: Secondary | ICD-10-CM | POA: Insufficient documentation

## 2017-02-04 DIAGNOSIS — R160 Hepatomegaly, not elsewhere classified: Secondary | ICD-10-CM

## 2017-02-04 DIAGNOSIS — Z171 Estrogen receptor negative status [ER-]: Secondary | ICD-10-CM | POA: Diagnosis present

## 2017-02-04 DIAGNOSIS — I700 Atherosclerosis of aorta: Secondary | ICD-10-CM | POA: Diagnosis not present

## 2017-02-04 DIAGNOSIS — C787 Secondary malignant neoplasm of liver and intrahepatic bile duct: Secondary | ICD-10-CM | POA: Diagnosis not present

## 2017-02-04 MED ORDER — IOPAMIDOL (ISOVUE-370) INJECTION 76%
100.0000 mL | Freq: Once | INTRAVENOUS | Status: AC | PRN
  Administered 2017-02-04: 100 mL via INTRAVENOUS

## 2017-02-04 NOTE — Progress Notes (Signed)
  Oncology Nurse Navigator Documentation  Navigator Location: [facility] (02/04/17 0900)   )Navigator Encounter Type: Clinic/MDC (02/04/17 0900)                             Interventions: Psycho-social support (02/04/17 0900)                      Time Spent with Patient: 90 (02/04/17 0900)   Met patient during her appointment with [name].  Encouraged her to discuss her back and hip pain with [name].  Patient is scheduled for CT scan and bone scan.  Offered support.  She is to call with any questions or needs.

## 2017-02-07 ENCOUNTER — Telehealth: Payer: Self-pay

## 2017-02-07 NOTE — Telephone Encounter (Signed)
  Oncology Nurse Navigator Documentation Received call from Dawn Allen stating she had a death in her family and needs to reschedule bone scan and follow up for next week. Scheduling notified. Navigator Location: [facility] (02/07/17 0900)   )Navigator Encounter Type: Telephone (02/07/17 0900)                                                    Time Spent with Patient: 15 (02/07/17 0900)

## 2017-02-08 ENCOUNTER — Emergency Department: Payer: Medicare Other

## 2017-02-08 ENCOUNTER — Telehealth: Payer: Self-pay | Admitting: *Deleted

## 2017-02-08 ENCOUNTER — Inpatient Hospital Stay
Admission: EM | Admit: 2017-02-08 | Discharge: 2017-02-13 | DRG: 436 | Disposition: A | Discharge status: Skilled Nursing Facility | Payer: Medicare Other | Attending: Internal Medicine | Admitting: Internal Medicine

## 2017-02-08 ENCOUNTER — Other Ambulatory Visit: Payer: Self-pay

## 2017-02-08 DIAGNOSIS — Z86718 Personal history of other venous thrombosis and embolism: Secondary | ICD-10-CM

## 2017-02-08 DIAGNOSIS — C787 Secondary malignant neoplasm of liver and intrahepatic bile duct: Secondary | ICD-10-CM | POA: Diagnosis present

## 2017-02-08 DIAGNOSIS — Z171 Estrogen receptor negative status [ER-]: Secondary | ICD-10-CM

## 2017-02-08 DIAGNOSIS — Z853 Personal history of malignant neoplasm of breast: Secondary | ICD-10-CM

## 2017-02-08 DIAGNOSIS — R531 Weakness: Secondary | ICD-10-CM

## 2017-02-08 DIAGNOSIS — E7220 Disorder of urea cycle metabolism, unspecified: Secondary | ICD-10-CM | POA: Diagnosis not present

## 2017-02-08 DIAGNOSIS — F329 Major depressive disorder, single episode, unspecified: Secondary | ICD-10-CM | POA: Diagnosis present

## 2017-02-08 DIAGNOSIS — I10 Essential (primary) hypertension: Secondary | ICD-10-CM | POA: Diagnosis present

## 2017-02-08 DIAGNOSIS — E876 Hypokalemia: Secondary | ICD-10-CM | POA: Diagnosis present

## 2017-02-08 DIAGNOSIS — Z880 Allergy status to penicillin: Secondary | ICD-10-CM

## 2017-02-08 DIAGNOSIS — Z85118 Personal history of other malignant neoplasm of bronchus and lung: Secondary | ICD-10-CM

## 2017-02-08 DIAGNOSIS — G40909 Epilepsy, unspecified, not intractable, without status epilepticus: Secondary | ICD-10-CM | POA: Diagnosis present

## 2017-02-08 DIAGNOSIS — F419 Anxiety disorder, unspecified: Secondary | ICD-10-CM | POA: Diagnosis present

## 2017-02-08 DIAGNOSIS — I248 Other forms of acute ischemic heart disease: Secondary | ICD-10-CM | POA: Diagnosis present

## 2017-02-08 DIAGNOSIS — M549 Dorsalgia, unspecified: Secondary | ICD-10-CM | POA: Diagnosis present

## 2017-02-08 DIAGNOSIS — E440 Moderate protein-calorie malnutrition: Secondary | ICD-10-CM | POA: Diagnosis present

## 2017-02-08 DIAGNOSIS — G893 Neoplasm related pain (acute) (chronic): Secondary | ICD-10-CM

## 2017-02-08 DIAGNOSIS — C50919 Malignant neoplasm of unspecified site of unspecified female breast: Secondary | ICD-10-CM | POA: Diagnosis present

## 2017-02-08 DIAGNOSIS — D62 Acute posthemorrhagic anemia: Secondary | ICD-10-CM | POA: Diagnosis present

## 2017-02-08 DIAGNOSIS — R160 Hepatomegaly, not elsewhere classified: Secondary | ICD-10-CM

## 2017-02-08 DIAGNOSIS — I252 Old myocardial infarction: Secondary | ICD-10-CM

## 2017-02-08 DIAGNOSIS — R17 Unspecified jaundice: Secondary | ICD-10-CM

## 2017-02-08 DIAGNOSIS — E871 Hypo-osmolality and hyponatremia: Secondary | ICD-10-CM | POA: Diagnosis present

## 2017-02-08 DIAGNOSIS — C50811 Malignant neoplasm of overlapping sites of right female breast: Secondary | ICD-10-CM | POA: Diagnosis present

## 2017-02-08 DIAGNOSIS — Z9861 Coronary angioplasty status: Secondary | ICD-10-CM

## 2017-02-08 DIAGNOSIS — R1011 Right upper quadrant pain: Secondary | ICD-10-CM

## 2017-02-08 DIAGNOSIS — Z79899 Other long term (current) drug therapy: Secondary | ICD-10-CM

## 2017-02-08 DIAGNOSIS — I2510 Atherosclerotic heart disease of native coronary artery without angina pectoris: Secondary | ICD-10-CM | POA: Diagnosis present

## 2017-02-08 DIAGNOSIS — Z515 Encounter for palliative care: Secondary | ICD-10-CM | POA: Diagnosis present

## 2017-02-08 DIAGNOSIS — M898X9 Other specified disorders of bone, unspecified site: Secondary | ICD-10-CM

## 2017-02-08 DIAGNOSIS — C7800 Secondary malignant neoplasm of unspecified lung: Secondary | ICD-10-CM | POA: Diagnosis present

## 2017-02-08 DIAGNOSIS — Z6826 Body mass index (BMI) 26.0-26.9, adult: Secondary | ICD-10-CM

## 2017-02-08 DIAGNOSIS — Z955 Presence of coronary angioplasty implant and graft: Secondary | ICD-10-CM

## 2017-02-08 DIAGNOSIS — E785 Hyperlipidemia, unspecified: Secondary | ICD-10-CM | POA: Diagnosis present

## 2017-02-08 DIAGNOSIS — C7970 Secondary malignant neoplasm of unspecified adrenal gland: Secondary | ICD-10-CM | POA: Diagnosis present

## 2017-02-08 DIAGNOSIS — F411 Generalized anxiety disorder: Secondary | ICD-10-CM

## 2017-02-08 DIAGNOSIS — E119 Type 2 diabetes mellitus without complications: Secondary | ICD-10-CM

## 2017-02-08 DIAGNOSIS — F17210 Nicotine dependence, cigarettes, uncomplicated: Secondary | ICD-10-CM | POA: Diagnosis present

## 2017-02-08 DIAGNOSIS — R748 Abnormal levels of other serum enzymes: Secondary | ICD-10-CM

## 2017-02-08 DIAGNOSIS — E1140 Type 2 diabetes mellitus with diabetic neuropathy, unspecified: Secondary | ICD-10-CM | POA: Diagnosis present

## 2017-02-08 DIAGNOSIS — F0634 Mood disorder due to known physiological condition with mixed features: Secondary | ICD-10-CM

## 2017-02-08 DIAGNOSIS — J90 Pleural effusion, not elsewhere classified: Secondary | ICD-10-CM | POA: Diagnosis present

## 2017-02-08 LAB — CBC
HEMATOCRIT: 39.1 % (ref 35.0–47.0)
Hemoglobin: 13.2 g/dL (ref 12.0–16.0)
MCH: 29 pg (ref 26.0–34.0)
MCHC: 33.6 g/dL (ref 32.0–36.0)
MCV: 86.2 fL (ref 80.0–100.0)
Platelets: 187 10*3/uL (ref 150–440)
RBC: 4.53 MIL/uL (ref 3.80–5.20)
RDW: 17.7 % — AB (ref 11.5–14.5)
WBC: 8.3 10*3/uL (ref 3.6–11.0)

## 2017-02-08 LAB — COMPREHENSIVE METABOLIC PANEL
ALBUMIN: 2.8 g/dL — AB (ref 3.5–5.0)
ALK PHOS: 1171 U/L — AB (ref 38–126)
ALT: 124 U/L — ABNORMAL HIGH (ref 14–54)
AST: 156 U/L — AB (ref 15–41)
Anion gap: 13 (ref 5–15)
BILIRUBIN TOTAL: 12 mg/dL — AB (ref 0.3–1.2)
BUN: 28 mg/dL — AB (ref 6–20)
CO2: 25 mmol/L (ref 22–32)
Calcium: 8.8 mg/dL — ABNORMAL LOW (ref 8.9–10.3)
Chloride: 90 mmol/L — ABNORMAL LOW (ref 101–111)
Creatinine, Ser: 0.73 mg/dL (ref 0.44–1.00)
GFR calc Af Amer: >60 mL/min (ref >60)
GFR calc non Af Amer: >60 mL/min (ref >60)
GLUCOSE: 142 mg/dL — AB (ref 65–99)
POTASSIUM: 2.9 mmol/L — AB (ref 3.5–5.1)
Sodium: 128 mmol/L — ABNORMAL LOW (ref 135–145)
TOTAL PROTEIN: 7.3 g/dL (ref 6.5–8.1)

## 2017-02-08 LAB — TROPONIN I: Troponin I: 0.06 ng/mL (ref <0.03)

## 2017-02-08 LAB — LIPASE, BLOOD: Lipase: 16 U/L (ref 11–51)

## 2017-02-08 MED ORDER — POTASSIUM CHLORIDE 20 MEQ PO PACK
40.0000 meq | PACK | Freq: Once | ORAL | Status: AC
  Administered 2017-02-08: 40 meq via ORAL
  Filled 2017-02-08: qty 2

## 2017-02-08 MED ORDER — SODIUM CHLORIDE 0.9 % IV BOLUS (SEPSIS)
1000.0000 mL | Freq: Once | INTRAVENOUS | Status: AC
  Administered 2017-02-08: 1000 mL via INTRAVENOUS

## 2017-02-08 NOTE — Telephone Encounter (Signed)
Per v/o [name], RN contacted patient as pt has cnl many apts.. Pt cnl her nuc med bone scan due to a funeral with her family in [place]. She will not be available until Monday for r/s her bone scan. Pt states that she didn't understand why the doctor was ordering this test. I explained to her that she mentioned the new back and hip pain to [name] and she would like to follow-up on this concern with this test to ensure that she doesn't have any bone mets. She gave verbal understanding of this and then proceeded to ask why the test times were spread out for the bone scan and asked if she would be asked to drink any solution. I explained to her that she would not be drinking any contrast; however, she would have an IV line started to inject the radioactive tracer substance medication in her veins. I explained that it may take up to 3 hrs to have the tracer material to "gather" in her bones.  Pt gave verbal understanding of this plan. She agreed to have the bone scan set up next week and her apt with [name] would be scheduled for results.  I explained to her the seriousness of her complying with the treatment plan giving her aggressive cancer diagnosis. I explained that she may have progressive disease and that the doctor needs these additional imaging to determine the plan of care. I explained that delays in her care could lead to negative health outcome. She gave verbal understanding and stated, "I don't want to die and I'm going to do everything I can. I didn't come through all these biopsies without giving it my best effort. Go ahead and proceed with setting everything up and let me know. But, I am not able to come at all this week." pt instructed that I will contact her with the new apts.  msg sent to scheduling team to r/s these apts for patient (per pt request).

## 2017-02-08 NOTE — ED Triage Notes (Signed)
Pt to triage via w/c with no distress noted; pt reports entire back/hips hurting since last week; breast CA, recently finished chemo; pt very difficulty to obtain information from; requires much encouragement to explain her symptoms; st "I just don't feel good"; pt reports also having mid/lower abd pain

## 2017-02-08 NOTE — ED Provider Notes (Addendum)
[facility] Emergency Department Provider Note   First MD Initiated Contact with Patient 02/08/17 2310     (approximate)  I have reviewed the triage vital signs and the nursing notes.   HISTORY  Chief Complaint Abdominal Pain and Back Pain    HPI Dawn Allen is a 66 y.o. female with below list of chronic medical conditions including metastatic breast cancer presents to the emergency department with multiple medical complaints including upper and lower back pain as well as hip pain times one week. In addition patient admits to unsteadiness on her feet. During evaluation patient repetitively asked both myself and the nurse to stand still. The nurse and I were not moving at a time and the patient felt as though we were moving. Patient admits to right upper quadrant abdominal pain that is currently 8 out of 10   Past Medical History:  Diagnosis Date  . Anxiety   . Breast cancer ([place]) 08/2015   right breast, chemo  . Cancer of right female breast ([place])   . Coronary artery disease    a. NSTEMI cath 08/02/14: LM nl, pLAD 50%, mLCx 30%, mRCA 95% s/p PCI/DES, 2nd lesion 40%, EF 55%  . Diabetes mellitus without complication ([place])   . DVT (deep venous thrombosis) (HCC)    LEFT LEG   . HLD (hyperlipidemia)   . Hypertension   . Lung cancer ([place])   . Lung nodule   . MI (myocardial infarction) ([place])   . Seizure disorder ([place])   . Seizures ([place])   . Squamous cell lung cancer ([place]) 04/23/2015    Patient Active Problem List   Diagnosis Date Noted  . Bone pain 02/03/2017  . Hyperbilirubinemia 02/03/2017  . Alkaline phosphatase elevation 02/03/2017  . Cancer related pain 02/03/2017  . Encounter for antineoplastic chemotherapy 01/25/2017  . Malignant neoplasm of right female breast ([place])   . Counseling regarding goals of care 12/21/2016  . Liver mass 10/21/2016  . Carcinoma of overlapping sites of right breast in female, estrogen receptor negative ([place])  02/17/2016  . Mass of right breast 08/14/2015  . Thyroid nodule 04/29/2015  . Squamous cell lung cancer ([place]) 04/23/2015  . Hypokalemia 03/02/2015  . Leukocytosis 03/02/2015  . Generalized weakness 03/02/2015  . Lung nodule 03/02/2015  . Pain in a tooth or teeth 03/02/2015  . Sepsis ([place]) 03/02/2015  . Lung mass 03/02/2015  . SIRS (systemic inflammatory response syndrome) ([place]) 02/27/2015  . Coronary artery disease   . Hypertension   . DVT (deep venous thrombosis) ([place])   . HLD (hyperlipidemia)   . Achilles bursitis or tendinitis 05/31/2013  . 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 Other hammer toe (acquired) 05/31/2013  . 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 ABDOMINAL HYSTERECTOMY     PARTIAL   . AXILLARY LYMPH NODE BIOPSY Right 05/26/2016   Procedure: AXILLARY LYMPH NODE BIOPSY;  Surgeon: Hubbard Robinson, MD;  Location: [facility] ORS;  Service: General;  Laterality: Right;  . BREAST BIOPSY Right 09/15/2015   positve  . CARDIAC CATHETERIZATION  08/02/2014  . CORONARY ANGIOPLASTY  08/02/2014   drug eluting stent placement  . IVC FILTER PLACEMENT ([facility] HX)    . LEG SURGERY Right    BLOOD CLOT  . MASTECTOMY, PARTIAL Right 05/26/2016   Procedure: MASTECTOMY PARTIAL;  Surgeon: Hubbard Robinson, MD;  Location: [facility] ORS;  Service: General;  Laterality: Right;  . PORT-A-CATH REMOVAL Left  01/18/2017   Procedure: REMOVAL PORT-A-CATH;  Surgeon: Vickie Epley, MD;  Location: [facility] ORS;  Service: General;  Laterality: Left;  . PORTACATH PLACEMENT Left 10/13/2015   Procedure: INSERTION PORT-A-CATH;  Surgeon: Hubbard Robinson, MD;  Location: [facility] ORS;  Service: General;  Laterality: Left;  . PORTACATH PLACEMENT Right 01/18/2017   Procedure: INSERTION PORT-A-CATH;  Surgeon: Vickie Epley, MD;  Location: [facility] ORS;  Service: General;  Laterality: Right;  . SENTINEL NODE BIOPSY Right 05/26/2016    Procedure: SENTINEL NODE BIOPSY;  Surgeon: Hubbard Robinson, MD;  Location: [facility] ORS;  Service: General;  Laterality: Right;    Prior to Admission medications   Medication Sig Start Date End Date Taking? Authorizing Provider  apixaban (ELIQUIS) 2.5 MG TABS tablet Take 1 tablet (2.5 mg total) by mouth 2 (two) times daily. Reported on 01/01/2016 12/20/16  Yes Cammie Sickle, MD  Aspirin-Salicylamide-Caffeine [facility] HEADACHE POWDER PO) Take 1 packet by mouth daily.   Yes [provider]  diazepam (VALIUM) 5 MG tablet Take 1 tablet (5 mg total) by mouth 2 (two) times daily as needed. Reported on 12/16/2015 01/06/17  Yes Cammie Sickle, MD  empagliflozin (JARDIANCE) 25 MG TABS tablet Take 25 mg by mouth daily.   Yes [provider]  HYDROcodone-acetaminophen (NORCO/VICODIN) 5-325 MG tablet Take 1 tablet by mouth every 6 (six) hours as needed for moderate pain. 02/03/17  Yes Corcoran, Drue Second, MD  ibuprofen (ADVIL,MOTRIN) 600 MG tablet Take 1 tablet (600 mg total) by mouth every 6 (six) hours as needed for moderate pain. 01/18/17  Yes Vickie Epley, MD  lactulose [facility]) 10 GM/15ML solution  01/14/17  Yes [provider]  lidocaine-prilocaine (EMLA) cream Apply 1 application topically as needed. Apply to port a cath site 1 hour before chemotherapy treatment. 12/14/16  Yes Cammie Sickle, MD  lisinopril-hydrochlorothiazide (PRINZIDE,ZESTORETIC) 20-25 MG per tablet Take 1 tablet by mouth daily.  07/26/14  Yes [provider]  nitroGLYCERIN (NITROSTAT) 0.4 MG SL tablet Place 0.4 mg under the tongue as needed. 01/14/17  Yes [provider]  phenytoin (DILANTIN) 100 MG ER capsule Take 300 mg by mouth daily.    Yes [provider]  pioglitazone (ACTOS) 45 MG tablet Take 1 tablet by mouth daily. 01/28/15  Yes [provider]  potassium chloride SA (K-DUR,KLOR-CON) 20 MEQ tablet Take 1 tablet (20 mEq total) by mouth daily. 02/03/17   Yes Corcoran, Drue Second, MD  TRADJENTA 5 MG TABS tablet Take 5 mg by mouth daily. 01/19/17  Yes [provider]  VENTOLIN HFA 108 (90 Base) MCG/ACT inhaler Inhale 1 puff into the lungs every 6 (six) hours as needed. Reported on 01/20/2016 01/09/16  Yes [provider]  atorvastatin (LIPITOR) 40 MG tablet Take 1 tablet by mouth daily. 05/24/16   [provider]  ondansetron (ZOFRAN ODT) 4 MG disintegrating tablet Take 1 tablet (4 mg total) by mouth every 8 (eight) hours as needed for nausea or vomiting. Patient not taking: Reported on 01/03/2017 10/26/16   Carrie Mew, MD  polyethylene glycol powder [facility]) powder Take 255 g by mouth once. Take as directed 06/08/16 06/08/16  Cammie Sickle, MD    Allergies Penicillins  Family History  Problem Relation Age of Onset  . Heart attack Mother   . Breast cancer Sister 4       bilaterally breast cancer    Social History Social History  Substance Use Topics  . Smoking status: Current Every Day  Smoker    Packs/day: 1.00    Years: 45.00    Types: Cigarettes  . Smokeless tobacco: Never Used  . Alcohol use No    Review of Systems Constitutional: No fever/chills Eyes: No visual changes. ENT: No sore throat. Cardiovascular: Denies chest pain. Respiratory: Denies shortness of breath. Gastrointestinal: No abdominal pain.  No nausea, no vomiting.  No diarrhea.  No constipation. Genitourinary: Negative for dysuria. Musculoskeletal: Negative for neck pain.  Negative for back pain. Integumentary: Negative for rash. Neurological: Negative for headaches, focal weakness or numbness.   ____________________________________________   PHYSICAL EXAM:  VITAL SIGNS: ED Triage Vitals  Enc Vitals Group     BP 02/08/17 2156 109/82     Pulse Rate 02/08/17 2156 93     Resp 02/08/17 2156 (!) 22     Temp 02/08/17 2156 98.1 F (36.7 C)     Temp Source 02/08/17 2156 Oral     SpO2 02/08/17 2156 100 %     Weight  02/08/17 2157 69.4 kg (153 lb)     Height 02/08/17 2157 1.651 m (5\' 5" )     Head Circumference --      Peak Flow --      Pain Score 02/08/17 2156 10     Pain Loc --      Pain Edu? --      Excl. in [place]? --     Constitutional: Alert But confused. Eyes: Conjunctivae are normal. Scleral icterus Head: Atraumatic. Mouth/Throat: Mucous membranes are moist.  Oropharynx non-erythematous. Neck: No stridor.  Cardiovascular: Normal rate, regular rhythm. Good peripheral circulation. Grossly normal heart sounds. Respiratory: Normal respiratory effort.  No retractions. Lungs CTAB. Gastrointestinal: Soft and nontender. No distention.  Musculoskeletal: No lower extremity tenderness nor edema. No gross deformities of extremities. Neurologic:  Normal speech and language. No gross focal neurologic deficits are appreciated.  Skin:  Skin is warm, dry and intact. Jaundice Psychiatric: Mood and affect are normal. Patient very tearful.  ____________________________________________   LABS (all labs ordered are listed, but only abnormal results are displayed)  Labs Reviewed  COMPREHENSIVE METABOLIC PANEL - Abnormal; Notable for the following:       Result Value   Sodium 128 (*)    Potassium 2.9 (*)    Chloride 90 (*)    Glucose, Bld 142 (*)    BUN 28 (*)    Calcium 8.8 (*)    Albumin 2.8 (*)    AST 156 (*)    ALT 124 (*)    Alkaline Phosphatase 1,171 (*)    Total Bilirubin 12.0 (*)    All other components within normal limits  CBC - Abnormal; Notable for the following:    RDW 17.7 (*)    All other components within normal limits  TROPONIN I - Abnormal; Notable for the following:    Troponin I 0.06 (*)    All other components within normal limits  AMMONIA - Abnormal; Notable for the following:    Ammonia 54 (*)    All other components within normal limits  LIPASE, BLOOD  URINALYSIS, COMPLETE (UACMP) WITH MICROSCOPIC   ____________________________________________  EKG  ED ECG REPORT I,  [place] N Trinady Milewski, the attending physician, personally viewed and interpreted this ECG.   Date: 02/09/2017  EKG Time: 10:01 PM  Rate: 91  Rhythm: Normal sinus rhythm  Axis: Normal  Intervals: Normal  ST&T Change: None  ____________________________________________  RADIOLOGY I, [place] N Makyle Eslick, personally viewed and evaluated these images (plain radiographs) as part  of my medical decision making, as well as reviewing the written report by the radiologist.  US Abdomen Limited Ruq  Result Date: 02/09/2017 CLINICAL DATA:  Initial evaluation for acute right upper quadrant and back pain. EXAM: ULTRASOUND ABDOMEN LIMITED RIGHT UPPER QUADRANT COMPARISON:  Prior CT from 02/04/2017. FINDINGS: Gallbladder: Gallbladder contracted. No internal cholelithiasis. Gallbladder wall measure 2.6 mm. No free pericholecystic fluid. No sonographic Boyko sign elicited on exam. Common bile duct: Diameter: 5 mm Liver: Multiple liver lesions present, largest of which present at the right hepatic lobe measures 9.2 x 8.2 x 8.2 cm. Findings consistent with known metastatic disease. No made of a few small right renal cyst. IMPRESSION: 1. Contracted gallbladder without evidence for cholelithiasis, acute cholecystitis, or biliary dilatation. 2. Multiple hepatic lesions, consistent with known hepatic metastases. Electronically Signed   By: Jeannine Boga M.D.   On: 02/09/2017 00:33     Procedures   ____________________________________________   INITIAL IMPRESSION / ASSESSMENT AND PLAN / ED COURSE  Pertinent labs & imaging results that were available during my care of the patient were reviewed by me and considered in my medical decision making (see chart for details).  66 year old female presenting to the emergency department with back and abdominal pain unsteady gait. Bizarre affect during evaluation with some confusion and a such concern for possible hyperammonia anemia which was confirmed with laboratory data.  Also concern about elevated bilirubin patient's bilirubin today to 12 up from 5.8 on 02/03/2017. As such patient given lactulose. Patient discussed with the hospitalist Dr. Jannifer Franklin for hospital admission for further evaluation and management. Of note patient also has an elevated troponin at 0.06 no chest pain or shortness of breath at this time. EKG revealed no evidence of ischemia or infarction.      ____________________________________________  FINAL CLINICAL IMPRESSION(S) / ED DIAGNOSES  Final diagnoses:  Jaundice  Liver metastases (HCC)  Right upper quadrant pain  Hyperammonemia (HCC)     MEDICATIONS GIVEN DURING THIS VISIT:  Medications  lactulose (CHRONULAC) 10 GM/15ML solution 30 g (not administered)  potassium chloride (KLOR-CON) packet 40 mEq (40 mEq Oral Given 02/08/17 2354)  sodium chloride 0.9 % bolus 1,000 mL (1,000 mLs Intravenous New Bag/Given 02/08/17 2357)     NEW OUTPATIENT MEDICATIONS STARTED DURING THIS VISIT:  New Prescriptions   No medications on file    Modified Medications   No medications on file    Discontinued Medications   No medications on file     Note:  This document was prepared using Dragon voice recognition software and may include unintentional dictation errors.    Gregor Hams, MD 02/09/17 Layla Maw    Gregor Hams, MD 02/09/17 [phone]

## 2017-02-08 NOTE — ED Notes (Signed)
Patient transported to Ultrasound 

## 2017-02-09 ENCOUNTER — Inpatient Hospital Stay: Payer: Medicare Other

## 2017-02-09 DIAGNOSIS — I10 Essential (primary) hypertension: Secondary | ICD-10-CM | POA: Diagnosis present

## 2017-02-09 DIAGNOSIS — E876 Hypokalemia: Secondary | ICD-10-CM | POA: Diagnosis present

## 2017-02-09 DIAGNOSIS — R0602 Shortness of breath: Secondary | ICD-10-CM | POA: Diagnosis not present

## 2017-02-09 DIAGNOSIS — F419 Anxiety disorder, unspecified: Secondary | ICD-10-CM | POA: Diagnosis not present

## 2017-02-09 DIAGNOSIS — C50811 Malignant neoplasm of overlapping sites of right female breast: Secondary | ICD-10-CM | POA: Diagnosis not present

## 2017-02-09 DIAGNOSIS — Z9861 Coronary angioplasty status: Secondary | ICD-10-CM | POA: Diagnosis not present

## 2017-02-09 DIAGNOSIS — C50919 Malignant neoplasm of unspecified site of unspecified female breast: Secondary | ICD-10-CM | POA: Diagnosis not present

## 2017-02-09 DIAGNOSIS — R634 Abnormal weight loss: Secondary | ICD-10-CM | POA: Diagnosis not present

## 2017-02-09 DIAGNOSIS — R05 Cough: Secondary | ICD-10-CM | POA: Diagnosis not present

## 2017-02-09 DIAGNOSIS — G40909 Epilepsy, unspecified, not intractable, without status epilepticus: Secondary | ICD-10-CM | POA: Diagnosis present

## 2017-02-09 DIAGNOSIS — I252 Old myocardial infarction: Secondary | ICD-10-CM | POA: Diagnosis not present

## 2017-02-09 DIAGNOSIS — E7220 Disorder of urea cycle metabolism, unspecified: Secondary | ICD-10-CM | POA: Diagnosis present

## 2017-02-09 DIAGNOSIS — I499 Cardiac arrhythmia, unspecified: Secondary | ICD-10-CM | POA: Diagnosis not present

## 2017-02-09 DIAGNOSIS — J90 Pleural effusion, not elsewhere classified: Secondary | ICD-10-CM | POA: Diagnosis not present

## 2017-02-09 DIAGNOSIS — Z79899 Other long term (current) drug therapy: Secondary | ICD-10-CM | POA: Diagnosis not present

## 2017-02-09 DIAGNOSIS — Z171 Estrogen receptor negative status [ER-]: Secondary | ICD-10-CM | POA: Diagnosis not present

## 2017-02-09 DIAGNOSIS — E440 Moderate protein-calorie malnutrition: Secondary | ICD-10-CM | POA: Diagnosis present

## 2017-02-09 DIAGNOSIS — K922 Gastrointestinal hemorrhage, unspecified: Secondary | ICD-10-CM | POA: Diagnosis not present

## 2017-02-09 DIAGNOSIS — D62 Acute posthemorrhagic anemia: Secondary | ICD-10-CM | POA: Diagnosis present

## 2017-02-09 DIAGNOSIS — K5900 Constipation, unspecified: Secondary | ICD-10-CM | POA: Diagnosis not present

## 2017-02-09 DIAGNOSIS — G893 Neoplasm related pain (acute) (chronic): Secondary | ICD-10-CM | POA: Diagnosis not present

## 2017-02-09 DIAGNOSIS — M549 Dorsalgia, unspecified: Secondary | ICD-10-CM | POA: Diagnosis not present

## 2017-02-09 DIAGNOSIS — Z86718 Personal history of other venous thrombosis and embolism: Secondary | ICD-10-CM | POA: Diagnosis not present

## 2017-02-09 DIAGNOSIS — C787 Secondary malignant neoplasm of liver and intrahepatic bile duct: Secondary | ICD-10-CM | POA: Diagnosis present

## 2017-02-09 DIAGNOSIS — E785 Hyperlipidemia, unspecified: Secondary | ICD-10-CM | POA: Diagnosis present

## 2017-02-09 DIAGNOSIS — K831 Obstruction of bile duct: Secondary | ICD-10-CM | POA: Diagnosis not present

## 2017-02-09 DIAGNOSIS — R17 Unspecified jaundice: Secondary | ICD-10-CM | POA: Diagnosis not present

## 2017-02-09 DIAGNOSIS — D649 Anemia, unspecified: Secondary | ICD-10-CM | POA: Diagnosis not present

## 2017-02-09 DIAGNOSIS — Z880 Allergy status to penicillin: Secondary | ICD-10-CM | POA: Diagnosis not present

## 2017-02-09 DIAGNOSIS — F329 Major depressive disorder, single episode, unspecified: Secondary | ICD-10-CM | POA: Diagnosis present

## 2017-02-09 DIAGNOSIS — C801 Malignant (primary) neoplasm, unspecified: Secondary | ICD-10-CM | POA: Diagnosis not present

## 2017-02-09 DIAGNOSIS — Z515 Encounter for palliative care: Secondary | ICD-10-CM | POA: Diagnosis present

## 2017-02-09 DIAGNOSIS — R5383 Other fatigue: Secondary | ICD-10-CM | POA: Diagnosis not present

## 2017-02-09 DIAGNOSIS — I2510 Atherosclerotic heart disease of native coronary artery without angina pectoris: Secondary | ICD-10-CM | POA: Diagnosis present

## 2017-02-09 DIAGNOSIS — Z6826 Body mass index (BMI) 26.0-26.9, adult: Secondary | ICD-10-CM | POA: Diagnosis not present

## 2017-02-09 DIAGNOSIS — Z7189 Other specified counseling: Secondary | ICD-10-CM | POA: Diagnosis not present

## 2017-02-09 DIAGNOSIS — C7800 Secondary malignant neoplasm of unspecified lung: Secondary | ICD-10-CM | POA: Diagnosis present

## 2017-02-09 DIAGNOSIS — R630 Anorexia: Secondary | ICD-10-CM | POA: Diagnosis not present

## 2017-02-09 DIAGNOSIS — Z955 Presence of coronary angioplasty implant and graft: Secondary | ICD-10-CM | POA: Diagnosis not present

## 2017-02-09 DIAGNOSIS — E871 Hypo-osmolality and hyponatremia: Secondary | ICD-10-CM | POA: Diagnosis present

## 2017-02-09 DIAGNOSIS — R110 Nausea: Secondary | ICD-10-CM | POA: Diagnosis not present

## 2017-02-09 DIAGNOSIS — C7970 Secondary malignant neoplasm of unspecified adrenal gland: Secondary | ICD-10-CM | POA: Diagnosis present

## 2017-02-09 DIAGNOSIS — R944 Abnormal results of kidney function studies: Secondary | ICD-10-CM | POA: Diagnosis not present

## 2017-02-09 DIAGNOSIS — E1140 Type 2 diabetes mellitus with diabetic neuropathy, unspecified: Secondary | ICD-10-CM | POA: Diagnosis present

## 2017-02-09 DIAGNOSIS — K921 Melena: Secondary | ICD-10-CM | POA: Diagnosis not present

## 2017-02-09 DIAGNOSIS — F17210 Nicotine dependence, cigarettes, uncomplicated: Secondary | ICD-10-CM | POA: Diagnosis present

## 2017-02-09 LAB — URINALYSIS, COMPLETE (UACMP) WITH MICROSCOPIC
BACTERIA UA: NONE SEEN
Glucose, UA: 100 mg/dL — AB
Hgb urine dipstick: NEGATIVE
Ketones, ur: NEGATIVE mg/dL
Leukocytes, UA: NEGATIVE
Nitrite: NEGATIVE
PROTEIN: NEGATIVE mg/dL
RBC / HPF: NONE SEEN RBC/hpf (ref 0–5)
SPECIFIC GRAVITY, URINE: 1.01 (ref 1.005–1.030)
pH: 6.5 (ref 5.0–8.0)

## 2017-02-09 LAB — GLUCOSE, CAPILLARY
Glucose-Capillary: 156 mg/dL — ABNORMAL HIGH (ref 65–99)
Glucose-Capillary: 130 mg/dL — ABNORMAL HIGH (ref 65–99)
Glucose-Capillary: 147 mg/dL — ABNORMAL HIGH (ref 65–99)
Glucose-Capillary: 192 mg/dL — ABNORMAL HIGH (ref 65–99)
Glucose-Capillary: 127 mg/dL — ABNORMAL HIGH (ref 65–99)

## 2017-02-09 LAB — AMMONIA: AMMONIA: 54 umol/L — AB (ref 9–35)

## 2017-02-09 LAB — BILIRUBIN, DIRECT: Bilirubin, Direct: 7.1 mg/dL — ABNORMAL HIGH (ref 0.1–0.5)

## 2017-02-09 LAB — TSH: TSH: 0.899 u[IU]/mL (ref 0.350–4.500)

## 2017-02-09 MED ORDER — ACETAMINOPHEN 325 MG PO TABS: 650.0000 mg | ORAL_TABLET | Freq: Four times a day (QID) | ORAL | Status: DC | PRN

## 2017-02-09 MED ORDER — ACETAMINOPHEN 650 MG RE SUPP: 650.0000 mg | Freq: Four times a day (QID) | RECTAL | Status: DC | PRN

## 2017-02-09 MED ORDER — GADOBENATE DIMEGLUMINE 529 MG/ML IV SOLN
13.0000 mL | Freq: Once | INTRAVENOUS | Status: AC | PRN
  Administered 2017-02-09: 13:00:00 13 mL via INTRAVENOUS

## 2017-02-09 MED ORDER — HYDROMORPHONE HCL 1 MG/ML IJ SOLN
0.5000 mg | INTRAMUSCULAR | Status: DC | PRN
  Administered 2017-02-09 – 2017-02-13 (×15): 0.5 mg via INTRAVENOUS
  Filled 2017-02-09 (×17): qty 0.5

## 2017-02-09 MED ORDER — LISINOPRIL-HYDROCHLOROTHIAZIDE 20-25 MG PO TABS: 1.0000 | ORAL_TABLET | Freq: Every day | ORAL | Status: DC

## 2017-02-09 MED ORDER — HYDROCODONE-ACETAMINOPHEN 5-325 MG PO TABS
1.0000 | ORAL_TABLET | Freq: Four times a day (QID) | ORAL | Status: DC | PRN
  Administered 2017-02-09: 1 via ORAL
  Filled 2017-02-09: qty 1

## 2017-02-09 MED ORDER — ONDANSETRON HCL 4 MG/2ML IJ SOLN
4.0000 mg | Freq: Once | INTRAMUSCULAR | Status: AC
  Administered 2017-02-09: 4 mg via INTRAVENOUS
  Filled 2017-02-09: qty 2

## 2017-02-09 MED ORDER — POTASSIUM CHLORIDE CRYS ER 20 MEQ PO TBCR
20.0000 meq | EXTENDED_RELEASE_TABLET | Freq: Every day | ORAL | Status: DC
  Administered 2017-02-09 – 2017-02-10 (×2): 20 meq via ORAL
  Filled 2017-02-09 (×2): qty 1

## 2017-02-09 MED ORDER — DIAZEPAM 5 MG PO TABS: 5.0000 mg | ORAL_TABLET | Freq: Two times a day (BID) | ORAL | Status: DC | PRN

## 2017-02-09 MED ORDER — DOCUSATE SODIUM 100 MG PO CAPS
100.0000 mg | ORAL_CAPSULE | Freq: Two times a day (BID) | ORAL | Status: DC
  Administered 2017-02-09 – 2017-02-13 (×5): 100 mg via ORAL
  Filled 2017-02-09 (×7): qty 1

## 2017-02-09 MED ORDER — PHENYTOIN SODIUM EXTENDED 100 MG PO CAPS
300.0000 mg | ORAL_CAPSULE | Freq: Every day | ORAL | Status: DC
Start: 2017-02-09 — End: 2017-02-13
  Administered 2017-02-09 – 2017-02-13 (×5): 300 mg via ORAL
  Filled 2017-02-09 (×5): qty 3

## 2017-02-09 MED ORDER — ONDANSETRON HCL 4 MG PO TABS
4.0000 mg | ORAL_TABLET | Freq: Four times a day (QID) | ORAL | Status: DC | PRN
  Administered 2017-02-13: 02:00:00 4 mg via ORAL
  Filled 2017-02-09: qty 1

## 2017-02-09 MED ORDER — APIXABAN 2.5 MG PO TABS
2.5000 mg | ORAL_TABLET | Freq: Two times a day (BID) | ORAL | Status: DC
  Administered 2017-02-09: 2.5 mg via ORAL
  Filled 2017-02-09: qty 1

## 2017-02-09 MED ORDER — HYDROCHLOROTHIAZIDE 25 MG PO TABS
25.0000 mg | ORAL_TABLET | Freq: Every day | ORAL | Status: DC
  Administered 2017-02-10 – 2017-02-12 (×3): 25 mg via ORAL
  Filled 2017-02-09 (×3): qty 1

## 2017-02-09 MED ORDER — ONDANSETRON HCL 4 MG/2ML IJ SOLN
4.0000 mg | Freq: Four times a day (QID) | INTRAMUSCULAR | Status: DC | PRN
  Administered 2017-02-11: 16:00:00 4 mg via INTRAVENOUS
  Filled 2017-02-09 (×2): qty 2

## 2017-02-09 MED ORDER — APIXABAN 5 MG PO TABS
ORAL_TABLET | ORAL | Status: AC
  Filled 2017-02-09: qty 1

## 2017-02-09 MED ORDER — SODIUM CHLORIDE 0.9 % IV SOLN
INTRAVENOUS | Status: DC
  Administered 2017-02-09 – 2017-02-10 (×4): via INTRAVENOUS
  Administered 2017-02-11: 1000 mL via INTRAVENOUS
  Administered 2017-02-11: 14:00:00 via INTRAVENOUS

## 2017-02-09 MED ORDER — LINAGLIPTIN 5 MG PO TABS
5.0000 mg | ORAL_TABLET | Freq: Every day | ORAL | Status: DC
  Administered 2017-02-09 – 2017-02-13 (×5): 5 mg via ORAL
  Filled 2017-02-09 (×5): qty 1

## 2017-02-09 MED ORDER — MORPHINE SULFATE (PF) 2 MG/ML IV SOLN
2.0000 mg | Freq: Once | INTRAVENOUS | Status: AC
  Administered 2017-02-09: 2 mg via INTRAVENOUS
  Filled 2017-02-09: qty 1

## 2017-02-09 MED ORDER — LACTULOSE 10 GM/15ML PO SOLN
20.0000 g | Freq: Three times a day (TID) | ORAL | Status: DC
  Administered 2017-02-09 – 2017-02-10 (×4): 20 g via ORAL
  Filled 2017-02-09 (×4): qty 30

## 2017-02-09 MED ORDER — APIXABAN 5 MG PO TABS
ORAL_TABLET | ORAL | Status: AC
  Administered 2017-02-09: 2.5 mg via ORAL
  Filled 2017-02-09: qty 1

## 2017-02-09 MED ORDER — INSULIN ASPART 100 UNIT/ML ~~LOC~~ SOLN
0.0000 [IU] | Freq: Three times a day (TID) | SUBCUTANEOUS | Status: DC
  Administered 2017-02-09 – 2017-02-11 (×2): 2 [IU] via SUBCUTANEOUS
  Administered 2017-02-12: 1 [IU] via SUBCUTANEOUS
  Administered 2017-02-12: 2 [IU] via SUBCUTANEOUS
  Administered 2017-02-12: 17:00:00 1 [IU] via SUBCUTANEOUS
  Administered 2017-02-13: 2 [IU] via SUBCUTANEOUS
  Administered 2017-02-13: 1 [IU] via SUBCUTANEOUS
  Filled 2017-02-09 (×2): qty 2
  Filled 2017-02-09: qty 1
  Filled 2017-02-09 (×2): qty 2
  Filled 2017-02-09 (×3): qty 1
  Filled 2017-02-09: qty 2

## 2017-02-09 MED ORDER — INSULIN ASPART 100 UNIT/ML ~~LOC~~ SOLN: 0.0000 [IU] | Freq: Every day | SUBCUTANEOUS | Status: DC

## 2017-02-09 MED ORDER — LISINOPRIL 20 MG PO TABS
20.0000 mg | ORAL_TABLET | Freq: Every day | ORAL | Status: DC
  Administered 2017-02-09 – 2017-02-13 (×5): 20 mg via ORAL
  Filled 2017-02-09 (×5): qty 1

## 2017-02-09 MED ORDER — LACTULOSE 10 GM/15ML PO SOLN
30.0000 g | Freq: Once | ORAL | Status: AC
  Administered 2017-02-09: 30 g via ORAL
  Filled 2017-02-09: qty 60

## 2017-02-09 MED ORDER — POLYETHYLENE GLYCOL 3350 17 GM/SCOOP PO POWD
1.0000 | Freq: Once | ORAL | Status: DC
  Filled 2017-02-09: qty 255

## 2017-02-09 MED ORDER — ENSURE ENLIVE PO LIQD
237.0000 mL | Freq: Two times a day (BID) | ORAL | Status: DC
  Administered 2017-02-12: 237 mL via ORAL

## 2017-02-09 MED ORDER — NITROGLYCERIN 0.4 MG SL SUBL: 0.4000 mg | SUBLINGUAL_TABLET | SUBLINGUAL | Status: DC | PRN

## 2017-02-09 MED ORDER — HYDROCODONE-ACETAMINOPHEN 5-325 MG PO TABS
1.0000 | ORAL_TABLET | ORAL | Status: DC | PRN
  Administered 2017-02-10: 2 via ORAL
  Filled 2017-02-09 (×2): qty 2

## 2017-02-09 NOTE — Progress Notes (Signed)
Dawn Allen NAME: Dawn Allen    MR#:  [id number]  DATE OF BIRTH:  06/15/1951  SUBJECTIVE:   I am very hungry Came in with back pain and hip pain REVIEW OF SYSTEMS:   Review of Systems  Constitutional: Positive for malaise/fatigue and weight loss. Negative for chills and fever.  HENT: Negative for ear discharge, ear pain and nosebleeds.   Eyes: Negative for blurred vision, pain and discharge.  Respiratory: Negative for sputum production, shortness of breath, wheezing and stridor.   Cardiovascular: Negative for chest pain, palpitations, orthopnea and PND.  Gastrointestinal: Positive for abdominal pain. Negative for diarrhea, nausea and vomiting.  Genitourinary: Negative for frequency and urgency.  Musculoskeletal: Positive for back pain. Negative for joint pain.  Neurological: Positive for weakness. Negative for sensory change, speech change and focal weakness.  Psychiatric/Behavioral: Negative for depression and hallucinations. The patient is not nervous/anxious.    Tolerating Diet: npo Tolerating PT: pending  DRUG ALLERGIES:   Allergies  Allergen Reactions  . Penicillins Swelling    rash    VITALS:  Blood pressure 130/66, pulse 79, temperature 98.1 F (36.7 C), temperature source Oral, resp. rate 14, height 5\' 5"  (1.651 m), weight 69.4 kg (153 lb), SpO2 92 %.  PHYSICAL EXAMINATION:   Physical Exam  GENERAL:  66 y.o.-year-old patient lying in the bed with no acute distress.  EYES: Pupils equal, round, reactive to light and accommodation. ++ scleral icterus. Extraocular muscles intact.  HEENT: Head atraumatic, normocephalic. Oropharynx and nasopharynx clear.  NECK:  Supple, no jugular venous distention. No thyroid enlargement, no tenderness.  LUNGS: Normal breath sounds bilaterally, no wheezing, rales, rhonchi. No use of accessory muscles of respiration.  CARDIOVASCULAR: S1, S2 normal. No murmurs, rubs, or  gallops.  ABDOMEN: Soft, nontender, nondistended. Bowel sounds present. No organomegaly or mass.  EXTREMITIES: No cyanosis, clubbing or edema b/l.    NEUROLOGIC: Cranial nerves II through XII are intact. No focal Motor or sensory deficits b/l.   PSYCHIATRIC:  patient is alert and oriented x 3.  SKIN: No obvious rash, lesion, or ulcer.   LABORATORY PANEL:  CBC  Recent Labs Lab 02/08/17 2159  WBC 8.3  HGB 13.2  HCT 39.1  PLT 187    Chemistries   Recent Labs Lab 02/08/17 2159  NA 128*  K 2.9*  CL 90*  CO2 25  GLUCOSE 142*  BUN 28*  CREATININE 0.73  CALCIUM 8.8*  AST 156*  ALT 124*  ALKPHOS 1,171*  BILITOT 12.0*   Cardiac Enzymes  Recent Labs Lab 02/08/17 2159  TROPONINI 0.06*   RADIOLOGY:  US Abdomen Limited Ruq  Result Date: 02/09/2017 CLINICAL DATA:  Initial evaluation for acute right upper quadrant and back pain. EXAM: ULTRASOUND ABDOMEN LIMITED RIGHT UPPER QUADRANT COMPARISON:  Prior CT from 02/04/2017. FINDINGS: Gallbladder: Gallbladder contracted. No internal cholelithiasis. Gallbladder wall measure 2.6 mm. No free pericholecystic fluid. No sonographic [place]fr[place] sign elicited on exam. Common bile duct: Diameter: 5 mm Liver: Multiple liver lesions present, largest of which present at the right hepatic lobe measures 9.2 x 8.2 x 8.2 cm. Findings consistent with known metastatic disease. No made of a few small right renal cyst. IMPRESSION: 1. Contracted gallbladder without evidence for cholelithiasis, acute cholecystitis, or biliary dilatation. 2. Multiple hepatic lesions, consistent with known hepatic metastases. Electronically Signed   By: Jeannine Boga M.D.   On: 02/09/2017 00:33   ASSESSMENT AND PLAN:  66 year old female admitted for cholestatic  jaundice.  1. Hyperbilirubinemia: Cholestatic likely secondary to metastasis from tumor burden -MRCP today -GI consult-spoke with Dr Vicente Males  2. Breast cancer: Stage IV, triple negative. Patient receiving palliative  care with Taxol chemotherapy. Manage pain  3. Coronary artery disease: The patient does not complain of chest pain however troponin is mildly elevated. EKG shows no indication of myocardial ischemia. Continue to follow cardiac biomarkers.  4. Hyponatremia: Associated with secondary lung cancer. Hypokalemia also present. Replete electrolytes; hydrate with normal saline with supplemental potassium.  5. Diabetes mellitus type 2: Sliding suicidal insulin while hospitalized. Hold oral hypoglycemic agents. May continue Tradjenta  8. Hypertension: Controlled; continue lisinopril -d/c hydrochlorothiazide due to low na and K  9. Seizure disorder: Continue Dilantin per home regimen  10. DVT prophylaxis: Eliquis for history of thrombosis (will hold for possible need for ERCP). Pt took last dose Monday evening  11. GI prophylaxis: None  Case discussed with Care Management/Social Worker. Management plans discussed with the patient, family and they are in agreement.  CODE STATUS: full  TOTAL TIME TAKING CARE OF THIS PATIENT: 30 minutes.  >50% time spent on counselling and coordination of care  POSSIBLE D/C IN 1-2 DAYS, DEPENDING ON CLINICAL CONDITION.  Note: This dictation was prepared with Dragon dictation along with smaller phrase technology. Any transcriptional errors that result from this process are unintentional.  Dawn Allen M.D on 02/09/2017 at 9:17 AM  Between 7am to 6pm - Pager - [phone]  After 6pm go to [email or web address] - password [email or web address] [place] [facility]  Office  [phone]  CC: Primary care physician; Lorelee Market, MD

## 2017-02-09 NOTE — Progress Notes (Signed)
Initial Nutrition Assessment  DOCUMENTATION CODES:   Non-severe (moderate) malnutrition in context of chronic illness  INTERVENTION:  Encouraged adequate intake of calories and protein at meals when diet able to be advanced. Discussed food items available on the menu here patient will enjoy and that will have adequate calories and protein.  Recommend Ensure Enlive po BID with diet advancement, each supplement provides 350 kcal and 20 grams of protein. Patient prefers chocolate.  NUTRITION DIAGNOSIS:   Malnutrition (Moderate) related to chronic illness (stg IV breast cancer) as evidenced by 9.1 percent weight loss over 4 months, moderate depletions of muscle mass, moderate depletion of body fat.  GOAL:   Patient will meet greater than or equal to 90% of their needs  MONITOR:   PO intake, Supplement acceptance, Labs, Weight trends, I & O's, Diet advancement  REASON FOR ASSESSMENT:   Malnutrition Screening Tool    ASSESSMENT:   66 year old female with PMHx of HTN, DVT in left leg, hx of MI, DM type 2, seizure disorder, CAD, HLD, anxiety, stage IV breast cancer on palliative chemotherapy with Taxol, presents with abdominal pain, back pain, nausea admitted with cholestatic jaundice likely secondary to metastasis from tumor burden.   -Per chart plan is for MRCP today.  Spoke with patient at bedside. Patient reports her appetite has remained good lately and she does not feel like she is eating any less than usual. She reports she eats two meals per day and also snacks. For breakfast she has fat back, eggs, hoecakes, and corn. For her second meal later she eats fried chicken, corn, pinto beans, greens, and cornbread. Patient reports she can usually finish her meals but does not always. Patient endorses constipation at baseline that is usually relieved with magnesium citrate. Denies any N/V, abdominal pain to this RD, though noted it on HPI. Patient does have poor dentition and reports it  can be difficult to chew, but she can choose meats easier to chew and cut them up as needed. Reports she has had Ensure before and enjoys the chocolate flavor.   Patient reports UBW was 220 lbs prior to diagnosis with cancer over 1.5 years ago. Recently patient was 170.5 lbs on 09/30/2016 and has lost 15.6 lbs (9.1% body weight) over the past 4 months, which is significant for time frame.  Medications reviewed and include: Colace, Novolog 0-9 units TID, Novolog 0-5 units QHS, lactulose, Miralax, potassium chloride 20 mEq daily, NS @ 75 ml/hr.  Labs reviewed: CBG 130-156, Direct Bilirubin 7.1. On 6/19 Sodium 128, Potassium 2.9, Chloride 90, BUN 28, Alkaline Phosphatase 1171, AST 156, ALT 124.   Nutrition-Focused physical exam completed. Findings are moderate fat depletion, moderate muscle depletion, and mild edema. Poor dentition.  Discussed with RN.  Diet Order:  Diet NPO time specified Except for: Sips with Meds  Skin:  Wound (see comment) (closed incisions on chest)  Last BM:  02/08/2017  Height:   Ht Readings from Last 1 Encounters:  02/08/17 5\' 5"  (1.651 m)    Weight:   Wt Readings from Last 1 Encounters:  02/09/17 154 lb 14.4 oz (70.3 kg)    Ideal Body Weight:  56.8 kg  BMI:  Body mass index is 25.78 kg/m.  Estimated Nutritional Needs:   Kcal:  8182-9937 (MSJ x 1.3-1.5)  Protein:  85-105 grams (1.2-1.5 grams/kg)  Fluid:  2 L/day (30 ml/kg)  EDUCATION NEEDS:   Education needs addressed  Willey Blade, MS, RD, LDN Pager: [phone] After Hours Pager: [phone]

## 2017-02-09 NOTE — H&P (Signed)
Dawn Allen is an 66 y.o. female.   Chief Complaint: Abdominal pain HPI: The patient with past medical history of stage IV breast cancer, coronary artery disease and hypertension presents to the emergency department complaining of abdominal pain. She also complains of back pain and nausea. She states that she has been unable to eat for days. Laboratory evaluation significant for doubling of her total bilirubin from 5 days ago which prompted the emergency department staff to call the hospitalist service for admission.  Past Medical History:  Diagnosis Date  . Anxiety   . Breast cancer ([place]) 08/2015   right breast, chemo  . Cancer of right female breast ([place])   . Coronary artery disease    a. NSTEMI cath 08/02/14: LM nl, pLAD 50%, mLCx 30%, mRCA 95% s/p PCI/DES, 2nd lesion 40%, EF 55%  . Diabetes mellitus without complication ([place])   . DVT (deep venous thrombosis) ([facility])    LEFT LEG   . HLD (hyperlipidemia)   . Hypertension   . Lung cancer ([place])   . Lung nodule   . MI (myocardial infarction) ([place])   . Seizure disorder ([place])   . Seizures ([place])   . Squamous cell lung cancer ([place]) 04/23/2015    Past Surgical History:  Procedure Laterality Date  . ABDOMINAL HYSTERECTOMY     PARTIAL   . AXILLARY LYMPH NODE BIOPSY Right 05/26/2016   Procedure: AXILLARY LYMPH NODE BIOPSY;  Surgeon: Hubbard Robinson, MD;  Location: [facility] ORS;  Service: General;  Laterality: Right;  . BREAST BIOPSY Right 09/15/2015   positve  . CARDIAC CATHETERIZATION  08/02/2014  . CORONARY ANGIOPLASTY  08/02/2014   drug eluting stent placement  . IVC FILTER PLACEMENT ([facility] HX)    . LEG SURGERY Right    BLOOD CLOT  . MASTECTOMY, PARTIAL Right 05/26/2016   Procedure: MASTECTOMY PARTIAL;  Surgeon: Hubbard Robinson, MD;  Location: [facility] ORS;  Service: General;  Laterality: Right;  . PORT-A-CATH REMOVAL Left 01/18/2017   Procedure: REMOVAL PORT-A-CATH;  Surgeon: Vickie Epley, MD;  Location: [facility] ORS;  Service:  General;  Laterality: Left;  . PORTACATH PLACEMENT Left 10/13/2015   Procedure: INSERTION PORT-A-CATH;  Surgeon: Hubbard Robinson, MD;  Location: [facility] ORS;  Service: General;  Laterality: Left;  . PORTACATH PLACEMENT Right 01/18/2017   Procedure: INSERTION PORT-A-CATH;  Surgeon: Vickie Epley, MD;  Location: [facility] ORS;  Service: General;  Laterality: Right;  . SENTINEL NODE BIOPSY Right 05/26/2016   Procedure: SENTINEL NODE BIOPSY;  Surgeon: Hubbard Robinson, MD;  Location: [facility] ORS;  Service: General;  Laterality: Right;    Family History  Problem Relation Age of Onset  . Heart attack Mother   . Breast cancer Sister 66       bilaterally breast cancer   Social History:  reports that she has been smoking Cigarettes.  She has a 45.00 pack-year smoking history. She has never used smokeless tobacco. She reports that she does not drink alcohol or use drugs.  Allergies:  Allergies  Allergen Reactions  . Penicillins Swelling    rash    Medications Prior to Admission  Medication Sig Dispense Refill  . apixaban (ELIQUIS) 2.5 MG TABS tablet Take 1 tablet (2.5 mg total) by mouth 2 (two) times daily. Reported on 01/01/2016 60 tablet 6  . Aspirin-Salicylamide-Caffeine (BC HEADACHE POWDER PO) Take 1 packet by mouth daily.    . diazepam (VALIUM) 5 MG tablet Take 1 tablet (5 mg total) by mouth 2 (two) times  daily as needed. Reported on 12/16/2015 60 tablet 0  . empagliflozin (JARDIANCE) 25 MG TABS tablet Take 25 mg by mouth daily.    Marland Kitchen HYDROcodone-acetaminophen (NORCO/VICODIN) 5-325 MG tablet Take 1 tablet by mouth every 6 (six) hours as needed for moderate pain. 40 tablet 0  . ibuprofen (ADVIL,MOTRIN) 600 MG tablet Take 1 tablet (600 mg total) by mouth every 6 (six) hours as needed for moderate pain. 30 tablet 0  . lactulose (CHRONULAC) 10 GM/15ML solution     . lidocaine-prilocaine (EMLA) cream Apply 1 application topically as needed. Apply to port a cath site 1 hour before chemotherapy  treatment. 30 g 6  . lisinopril-hydrochlorothiazide (PRINZIDE,ZESTORETIC) 20-25 MG per tablet Take 1 tablet by mouth daily.     . nitroGLYCERIN (NITROSTAT) 0.4 MG SL tablet Place 0.4 mg under the tongue as needed.    . phenytoin (DILANTIN) 100 MG ER capsule Take 300 mg by mouth daily.     . pioglitazone (ACTOS) 45 MG tablet Take 1 tablet by mouth daily.    . potassium chloride SA (K-DUR,KLOR-CON) 20 MEQ tablet Take 1 tablet (20 mEq total) by mouth daily. 3 tablet 0  . TRADJENTA 5 MG TABS tablet Take 5 mg by mouth daily.    . VENTOLIN HFA 108 (90 Base) MCG/ACT inhaler Inhale 1 puff into the lungs every 6 (six) hours as needed. Reported on 01/20/2016    . atorvastatin (LIPITOR) 40 MG tablet Take 1 tablet by mouth daily.    . ondansetron (ZOFRAN ODT) 4 MG disintegrating tablet Take 1 tablet (4 mg total) by mouth every 8 (eight) hours as needed for nausea or vomiting. (Patient not taking: Reported on 01/03/2017) 20 tablet 0  . polyethylene glycol powder (MIRALAX) powder Take 255 g by mouth once. Take as directed 255 g 0    Results for orders placed or performed during the hospital encounter of 02/08/17 (from the past 48 hour(s))  Lipase, blood     Status: None   Collection Time: 02/08/17  9:59 PM  Result Value Ref Range   Lipase 16 11 - 51 U/L  Comprehensive metabolic panel     Status: Abnormal   Collection Time: 02/08/17  9:59 PM  Result Value Ref Range   Sodium 128 (L) 135 - 145 mmol/L   Potassium 2.9 (L) 3.5 - 5.1 mmol/L   Chloride 90 (L) 101 - 111 mmol/L   CO2 25 22 - 32 mmol/L   Glucose, Bld 142 (H) 65 - 99 mg/dL   BUN 28 (H) 6 - 20 mg/dL   Creatinine, Ser 0.73 0.44 - 1.00 mg/dL   Calcium 8.8 (L) 8.9 - 10.3 mg/dL   Total Protein 7.3 6.5 - 8.1 g/dL   Albumin 2.8 (L) 3.5 - 5.0 g/dL   AST 156 (H) 15 - 41 U/L   ALT 124 (H) 14 - 54 U/L   Alkaline Phosphatase 1,171 (H) 38 - 126 U/L   Total Bilirubin 12.0 (H) 0.3 - 1.2 mg/dL   GFR calc non Af Amer >60 >60 mL/min   GFR calc Af Amer >60 >60  mL/min    Comment: (NOTE) The eGFR has been calculated using the CKD EPI equation. This calculation has not been validated in all clinical situations. eGFR's persistently <60 mL/min signify possible Chronic Kidney Disease.    Anion gap 13 5 - 15  CBC     Status: Abnormal   Collection Time: 02/08/17  9:59 PM  Result Value Ref Range   WBC 8.3  3.6 - 11.0 K/uL   RBC 4.53 3.80 - 5.20 MIL/uL   Hemoglobin 13.2 12.0 - 16.0 g/dL   HCT 39.1 35.0 - 47.0 %   MCV 86.2 80.0 - 100.0 fL   MCH 29.0 26.0 - 34.0 pg   MCHC 33.6 32.0 - 36.0 g/dL   RDW 17.7 (H) 11.5 - 14.5 %   Platelets 187 150 - 440 K/uL  Troponin I     Status: Abnormal   Collection Time: 02/08/17  9:59 PM  Result Value Ref Range   Troponin I 0.06 (HH) <0.03 ng/mL    Comment: CRITICAL RESULT CALLED TO, READ BACK BY AND VERIFIED WITH [name] COYNE RN AT 2255 02/08/17 MSS.   Ammonia     Status: Abnormal   Collection Time: 02/08/17 11:41 PM  Result Value Ref Range   Ammonia 54 (H) 9 - 35 umol/L    Comment: HEMOLYSIS AT THIS LEVEL MAY AFFECT RESULT ICTERUS AT THIS LEVEL MAY AFFECT RESULT   Urinalysis, Complete w Microscopic     Status: Abnormal   Collection Time: 02/09/17 12:46 AM  Result Value Ref Range   Color, Urine AMBER (A) YELLOW    Comment: BIOCHEMICALS MAY BE AFFECTED BY COLOR   APPearance CLEAR CLEAR   Specific Gravity, Urine 1.010 1.005 - 1.030   pH 6.5 5.0 - 8.0   Glucose, UA 100 (A) NEGATIVE mg/dL   Hgb urine dipstick NEGATIVE NEGATIVE   Bilirubin Urine MODERATE (A) NEGATIVE   Ketones, ur NEGATIVE NEGATIVE mg/dL   Protein, ur NEGATIVE NEGATIVE mg/dL   Nitrite NEGATIVE NEGATIVE   Leukocytes, UA NEGATIVE NEGATIVE   Squamous Epithelial / LPF 0-5 (A) NONE SEEN   WBC, UA 0-5 0 - 5 WBC/hpf   RBC / HPF NONE SEEN 0 - 5 RBC/hpf   Bacteria, UA NONE SEEN NONE SEEN  Glucose, capillary     Status: Abnormal   Collection Time: 02/09/17  4:26 AM  Result Value Ref Range   Glucose-Capillary 156 (H) 65 - 99 mg/dL   US Abdomen  Limited Ruq  Result Date: 02/09/2017 CLINICAL DATA:  Initial evaluation for acute right upper quadrant and back pain. EXAM: ULTRASOUND ABDOMEN LIMITED RIGHT UPPER QUADRANT COMPARISON:  Prior CT from 02/04/2017. FINDINGS: Gallbladder: Gallbladder contracted. No internal cholelithiasis. Gallbladder wall measure 2.6 mm. No free pericholecystic fluid. No sonographic [place] sign elicited on exam. Common bile duct: Diameter: 5 mm Liver: Multiple liver lesions present, largest of which present at the right hepatic lobe measures 9.2 x 8.2 x 8.2 cm. Findings consistent with known metastatic disease. No made of a few small right renal cyst. IMPRESSION: 1. Contracted gallbladder without evidence for cholelithiasis, acute cholecystitis, or biliary dilatation. 2. Multiple hepatic lesions, consistent with known hepatic metastases. Electronically Signed   By: Jeannine Boga M.D.   On: 02/09/2017 00:33    Review of Systems  Constitutional: Negative for chills and fever.  HENT: Negative for sore throat and tinnitus.   Eyes: Negative for blurred vision and redness.  Respiratory: Negative for cough and shortness of breath.   Cardiovascular: Negative for chest pain, palpitations, orthopnea and PND.  Gastrointestinal: Positive for abdominal pain and nausea. Negative for diarrhea and vomiting.  Genitourinary: Negative for dysuria, frequency and urgency.  Musculoskeletal: Positive for back pain. Negative for joint pain and myalgias.  Skin: Negative for rash.       No lesions  Neurological: Positive for weakness. Negative for speech change and focal weakness.  Endo/Heme/Allergies: Does not bruise/bleed easily.  No temperature intolerance  Psychiatric/Behavioral: Negative for depression and suicidal ideas.    Blood pressure 114/64, pulse 76, temperature 97.9 F (36.6 C), temperature source Oral, resp. rate 14, height '5\' 5"'  (1.651 m), weight 69.4 kg (153 lb), SpO2 94 %. Physical Exam  Vitals  reviewed. Constitutional: She is oriented to person, place, and time. She appears well-developed and well-nourished. No distress.  HENT:  Head: Normocephalic and atraumatic.  Mouth/Throat: Oropharynx is clear and moist.  Eyes: Conjunctivae and EOM are normal. Pupils are equal, round, and reactive to light. Scleral icterus is present.  Neck: Normal range of motion. Neck supple. No JVD present. No tracheal deviation present. No thyromegaly present.  Cardiovascular: Normal rate, regular rhythm and normal heart sounds.  Exam reveals no gallop and no friction rub.   No murmur heard. Respiratory: Effort normal and breath sounds normal.  GI: Soft. Bowel sounds are normal. She exhibits no distension and no mass. There is tenderness. There is no rebound and no guarding.  Genitourinary:  Genitourinary Comments: Deferred  Musculoskeletal: Normal range of motion. She exhibits no edema.  Lymphadenopathy:    She has no cervical adenopathy.  Neurological: She is alert and oriented to person, place, and time. No cranial nerve deficit. She exhibits normal muscle tone.  Skin: Skin is warm and dry. No rash noted. No erythema.  jaundice  Psychiatric: She has a normal mood and affect. Her behavior is normal. Judgment and thought content normal.     Assessment/Plan This is a 66 year old female admitted for cholestatic jaundice. 1. Hyperbilirubinemia: Cholestatic likely secondary to metastasis; check ratio of direct bilirubin to total (likely conjugated). Surgery has been consulted for possible biliary stent placement. 2. Breast cancer: Stage IV, triple negative. Patient receiving palliative care with Taxol chemotherapy. Manage pain 3. Coronary artery disease: The patient does not complain of chest pain however troponin is mildly elevated. EKG shows no indication of myocardial ischemia. Continue to follow cardiac biomarkers. 4. Hyponatremia: Associated with secondary lung cancer. Hypokalemia also present.  Replete electrolytes; hydrate with normal saline with supplemental potassium. 5. Diabetes mellitus type 2: Sliding suicidal insulin while hospitalized. Hold oral hypoglycemic agents. May continue Tradjenta 8. Hypertension: Controlled; continue lisinopril hydrochlorothiazide  9. Seizure disorder: Continue Dilantin per home regimen 10. DVT prophylaxis: Eliquis for history of thrombosis 11. GI prophylaxis: None The patient is a full code. Time spent on admission orders and patient care approximately 45 minutes  Harrie Foreman, MD 02/09/2017, 7:00 AM

## 2017-02-09 NOTE — Plan of Care (Signed)
Problem: Education: Goal: Knowledge of [facility] Education information/materials will improve Outcome: Progressing Pt likes to be called Dawn Allen  PAST MEDICAL HISTORY :      Past Medical History:  Diagnosis Date  . Anxiety   . Breast cancer ([place]) 08/2015   right breast, chemo  . Cancer of right female breast ([place])   . Coronary artery disease    a. NSTEMI cath 08/02/14: LM nl, pLAD 50%, mLCx 30%, mRCA 95% s/p PCI/DES, 2nd lesion 40%, EF 55%  . Diabetes mellitus without complication ([place])   . DVT (deep venous thrombosis) ([facility])    LEFT LEG   . HLD (hyperlipidemia)   . Hypertension   . Lung cancer ([place])   . Lung nodule   . MI (myocardial infarction) ([place])   . Seizure disorder ([place])   . Seizures ([place])   . Squamous cell lung cancer ([place]) 04/23/2015

## 2017-02-09 NOTE — Consult Note (Signed)
Dawn Bellows MD, MRCP(U.K) [address]  [place]  [place], [place] [address]  Main: [phone]  Fax: [phone]  Consultation  Referring Provider:     Dr Posey Pronto Primary Care Physician:  Lorelee Market, MD Primary Gastroenterologist:None     Reason for Consultation:     Elevated bilirubin   Date of Admission:  02/08/2017 Date of Consultation:  02/09/2017         HPI:   Dawn Allen is a 66 y.o. female with metastatic breast cancer came into the ER today with nausea and being unable to eat for a few days.  In the ER labs indicated a marked rise in bilirubin and was hence admitted. Patient denies any pain or fever. The liver ultrasound showed no dilation of the common bile duct but on subsequent MRCP which was done earlier today there is concern for extrinsic compression on the bile ducts at the level of the porta hepatis . She takes Eloquis and her last dose per nurse was at 1.50 the morning of 01/2017.    Past Medical History:  Diagnosis Date  . Anxiety   . Breast cancer ([facility]) 08/2015   right breast, chemo  . Cancer of right female breast ([facility])   . Coronary artery disease    a. NSTEMI cath 08/02/14: LM nl, pLAD 50%, mLCx 30%, mRCA 95% s/p PCI/DES, 2nd lesion 40%, EF 55%  . Diabetes mellitus without complication ([facility])   . DVT (deep venous thrombosis) ([facility])    LEFT LEG   . HLD (hyperlipidemia)   . Hypertension   . Lung cancer ([facility])   . Lung nodule   . MI (myocardial infarction) ([facility])   . Seizure disorder ([facility])   . Seizures ([facility])   . 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 ABDOMINAL HYSTERECTOMY     PARTIAL   . AXILLARY LYMPH NODE BIOPSY Right 05/26/2016   Procedure: AXILLARY LYMPH NODE BIOPSY;  Surgeon: Hubbard Robinson, MD;  Location: [facility] ORS;  Service: General;  Laterality: Right;  . BREAST BIOPSY Right 09/15/2015   positve  . CARDIAC CATHETERIZATION  08/02/2014  . CORONARY ANGIOPLASTY   08/02/2014   drug eluting stent placement  . IVC FILTER PLACEMENT ([facility] HX)    . LEG SURGERY Right    BLOOD CLOT  . MASTECTOMY, PARTIAL Right 05/26/2016   Procedure: MASTECTOMY PARTIAL;  Surgeon: Hubbard Robinson, MD;  Location: [facility] ORS;  Service: General;  Laterality: Right;  . PORT-A-CATH REMOVAL Left 01/18/2017   Procedure: REMOVAL PORT-A-CATH;  Surgeon: Vickie Epley, MD;  Location: [facility] ORS;  Service: General;  Laterality: Left;  . PORTACATH PLACEMENT Left 10/13/2015   Procedure: INSERTION PORT-A-CATH;  Surgeon: Hubbard Robinson, MD;  Location: [facility] ORS;  Service: General;  Laterality: Left;  . PORTACATH PLACEMENT Right 01/18/2017   Procedure: INSERTION PORT-A-CATH;  Surgeon: Vickie Epley, MD;  Location: [facility] ORS;  Service: General;  Laterality: Right;  . SENTINEL NODE BIOPSY Right 05/26/2016   Procedure: SENTINEL NODE BIOPSY;  Surgeon: Hubbard Robinson, MD;  Location: [facility] OR[facility];  Service: General;  Laterality: Right;    Prior to Admission medications   Medication Sig Start Date End Date Taking? Authorizing Provider  apixaban (ELIQUIS) 2.5 MG TABS tablet Take 1 tablet (2.5 mg total) by mouth 2 (two) times daily. Reported on 01/01/2016 12/20/16  Yes Cammie Sickle, MD  Aspirin-Salicylamide-Caffeine (BC HEADACHE POWDER PO) Take 1 packet by  mouth daily.   Yes [provider]  diazepam (VALIUM) 5 MG tablet Take 1 tablet (5 mg total) by mouth 2 (two) times daily as needed. Reported on 12/16/2015 01/06/17  Yes Cammie Sickle, MD  empagliflozin (JARDIANCE) 25 MG TABS tablet Take 25 mg by mouth daily.   Yes [provider]  HYDROcodone-acetaminophen (NORCO/VICODIN) 5-325 MG tablet Take 1 tablet by mouth every 6 (six) hours as needed for moderate pain. 02/03/17  Yes Corcoran, Drue Second, MD  ibuprofen (ADVIL,MOTRIN) 600 MG tablet Take 1 tablet (600 mg total) by mouth every 6 (six) hours as needed for moderate pain. 01/18/17  Yes Vickie Epley, MD  lactulose  [facility]) 10 GM/15ML solution  01/14/17  Yes [provider]  lidocaine-prilocaine (EMLA) cream Apply 1 application topically as needed. Apply to port a cath site 1 hour before chemotherapy treatment. 12/14/16  Yes Cammie Sickle, MD  lisinopril-hydrochlorothiazide (PRINZIDE,ZESTORETIC) 20-25 MG per tablet Take 1 tablet by mouth daily.  07/26/14  Yes [provider]  nitroGLYCERIN (NITROSTAT) 0.4 MG SL tablet Place 0.4 mg under the tongue as needed. 01/14/17  Yes [provider]  phenytoin (DILANTIN) 100 MG ER capsule Take 300 mg by mouth daily.    Yes [provider]  pioglitazone (ACTOS) 45 MG tablet Take 1 tablet by mouth daily. 01/28/15  Yes [provider]  potassium chloride SA (K-DUR,KLOR-CON) 20 MEQ tablet Take 1 tablet (20 mEq total) by mouth daily. 02/03/17  Yes Corcoran, Drue Second, MD  TRADJENTA 5 MG TABS tablet Take 5 mg by mouth daily. 01/19/17  Yes [provider]  VENTOLIN HFA 108 (90 Base) MCG/ACT inhaler Inhale 1 puff into the lungs every 6 (six) hours as needed. Reported on 01/20/2016 01/09/16  Yes [provider]  atorvastatin (LIPITOR) 40 MG tablet Take 1 tablet by mouth daily. 05/24/16   [provider]  ondansetron (ZOFRAN ODT) 4 MG disintegrating tablet Take 1 tablet (4 mg total) by mouth every 8 (eight) hours as needed for nausea or vomiting. Patient not taking: Reported on 01/03/2017 10/26/16   Carrie Mew, MD  polyethylene glycol powder [facility]) powder Take 255 g by mouth once. Take as directed 06/08/16 06/08/16  Cammie Sickle, MD    Family History  Problem Relation Age of Onset  . Heart attack Mother   . Breast cancer Sister 41       bilaterally breast cancer     Social History  Substance Use Topics  . Smoking status: Current Every Day Smoker    Packs/day: 1.00    Years: 45.00    Types: Cigarettes  . Smokeless tobacco: Never Used  . Alcohol use No    Allergies as of 02/08/2017  - Review Complete 02/08/2017  Allergen Reaction Noted  . Penicillins Swelling 08/06/2014    Review of Systems:    All systems reviewed and negative except where noted in HPI.   Physical Exam:  Vital signs in last 24 hours: Temp:  [97.9 F (36.6 C)-98.4 F (36.9 C)] 98.2 F (36.8 C) (06/20 1447) Pulse Rate:  [76-93] 82 (06/20 1541) Resp:  [14-23] 18 (06/20 1330) BP: (109-142)/(64-84) 130/64 (06/20 1541) SpO2:  [90 %-100 %] 90 % (06/20 1447) Weight:  [153 lb (69.4 kg)-154 lb 14.4 oz (70.3 kg)] 154 lb 14.4 oz (70.3 kg) (06/20 1125) Last BM Date: 02/09/17 General:   Pleasant, cooperative in NAD Head:  Normocephalic and atraumatic. Eyes:   No icterus.   Conjunctiva pink. PERRLA. Ears:  Normal auditory acuity. Neck:  Supple; no masses or thyroidomegaly Lungs: Respirations even and unlabored. Lungs clear to auscultation bilaterally.   No wheezes, crackles, or rhonchi.  Heart:  Regular rate and rhythm;  Without murmur, clicks, rubs or gallops Abdomen:  Soft, nondistended, nontender. Normal bowel sounds. No appreciable masses or hepatomegaly.  No rebound or guarding.  Extremities:  Without edema, cyanosis or clubbing. Neurologic:  Alert and oriented x3;  grossly normal neurologically. Psych:  Alert and cooperative. Normal affect.  LAB RESULTS:  Recent Labs  02/08/17 2159  WBC 8.3  HGB 13.2  HCT 39.1  PLT 187   BMET  Recent Labs  02/08/17 2159  NA 128*  K 2.9*  CL 90*  CO2 25  GLUCOSE 142*  BUN 28*  CREATININE 0.73  CALCIUM 8.8*   LFT  Recent Labs  02/08/17 2159 02/09/17 0803  PROT 7.3  --   ALBUMIN 2.8*  --   AST 156*  --   ALT 124*  --   ALKPHOS 1,171*  --   BILITOT 12.0*  --   BILIDIR  --  7.1*   PT/INR No results for input(s): LABPROT, INR in the last 72 hours.  STUDIES: Mr 3d Recon At Scanner  Result Date: 02/09/2017 CLINICAL DATA:  Right breast cancer with hepatic metastatic disease. Elevated liver function studies. EXAM: MRI ABDOMEN WITHOUT AND  WITH CONTRAST (INCLUDING MRCP) TECHNIQUE: Multiplanar multisequence MR imaging of the abdomen was performed both before and after the administration of intravenous contrast. Heavily T2-weighted images of the biliary and pancreatic ducts were obtained, and three-dimensional MRCP images were rendered by post processing. CONTRAST:  58m MULTIHANCE GADOBENATE DIMEGLUMINE 529 MG/ML IV SOLN COMPARISON:  PET-CT 10/13/2016.  Abdominal CT 02/04/2017. FINDINGS: Lower chest: Complex left pleural effusion is again partially imaged with irregular pleural thickening and enhancement, likely malignant. No significant pleural fluid on the right. Grossly stable fluid collection laterally in the right breast without suspicious enhancement. Hepatobiliary: Again demonstrated is widespread hepatic metastatic disease with a large conglomerate mass centrally in the liver measuring up to 11.9 x 10.0 x 13.1 cm. This partially encases and exerts mass effect on the hepatic and portal veins as well as the IVC. No tumor thrombus identified. There is probable extrinsic compression of the biliary system in the hepatic hilum. Mild intrahepatic biliary dilatation is present. The common bile duct is decompressed. The gallbladder is collapsed. Apart from the dominant central liver mass, there are multiple smaller lesions, similar to recent CT. Pancreas: Unremarkable. No pancreatic ductal dilatation or surrounding inflammatory changes. Spleen: Normal in size without focal abnormality. Adrenals/Urinary Tract: Small bilateral adrenal masses are grossly stable from previous studies and were not hypermetabolic on PET-CT, probably adenomas or hyperplasia. There are several cysts within the lower pole of the right kidney. The left kidney appears normal. No hydronephrosis. Stomach/Bowel: No evidence of bowel wall thickening, distention or surrounding inflammatory change. Vascular/Lymphatic: Stable adenopathy within the porta hepatis, potentially contributing  to mass effect on the biliary system. No acute vascular findings. As above, there is extrinsic mass effect on the intrahepatic vasculature by the large central hepatic mass. Aortic and branch vessel atherosclerosis better demonstrated by CT. IVC filter noted. Other: No ascites or peritoneal nodularity. Musculoskeletal: No acute or significant osseous findings. IMPRESSION: 1. Large conglomerate central hepatic metastases likely contribute to extrinsic compression of the biliary system in the porta hepatis, contributing to the patient's elevated liver function studies. The common bile duct is decompressed. 2. Probable metastatic lymphadenopathy within  the porta hepatis. 3. Probable malignant left pleural effusion. 4. Stable bilateral adrenal adenomas or hyperplasia. Electronically Signed   By: Richardean Sale M.D.   On: 02/09/2017 13:31   Mr Abdomen Mrcp Moise Boring Contast  Result Date: 02/09/2017 CLINICAL DATA:  Right breast cancer with hepatic metastatic disease. Elevated liver function studies. EXAM: MRI ABDOMEN WITHOUT AND WITH CONTRAST (INCLUDING MRCP) TECHNIQUE: Multiplanar multisequence MR imaging of the abdomen was performed both before and after the administration of intravenous contrast. Heavily T2-weighted images of the biliary and pancreatic ducts were obtained, and three-dimensional MRCP images were rendered by post processing. CONTRAST:  31m MULTIHANCE GADOBENATE DIMEGLUMINE 529 MG/ML IV SOLN COMPARISON:  PET-CT 10/13/2016.  Abdominal CT 02/04/2017. FINDINGS: Lower chest: Complex left pleural effusion is again partially imaged with irregular pleural thickening and enhancement, likely malignant. No significant pleural fluid on the right. Grossly stable fluid collection laterally in the right breast without suspicious enhancement. Hepatobiliary: Again demonstrated is widespread hepatic metastatic disease with a large conglomerate mass centrally in the liver measuring up to 11.9 x 10.0 x 13.1 cm. This  partially encases and exerts mass effect on the hepatic and portal veins as well as the IVC. No tumor thrombus identified. There is probable extrinsic compression of the biliary system in the hepatic hilum. Mild intrahepatic biliary dilatation is present. The common bile duct is decompressed. The gallbladder is collapsed. Apart from the dominant central liver mass, there are multiple smaller lesions, similar to recent CT. Pancreas: Unremarkable. No pancreatic ductal dilatation or surrounding inflammatory changes. Spleen: Normal in size without focal abnormality. Adrenals/Urinary Tract: Small bilateral adrenal masses are grossly stable from previous studies and were not hypermetabolic on PET-CT, probably adenomas or hyperplasia. There are several cysts within the lower pole of the right kidney. The left kidney appears normal. No hydronephrosis. Stomach/Bowel: No evidence of bowel wall thickening, distention or surrounding inflammatory change. Vascular/Lymphatic: Stable adenopathy within the porta hepatis, potentially contributing to mass effect on the biliary system. No acute vascular findings. As above, there is extrinsic mass effect on the intrahepatic vasculature by the large central hepatic mass. Aortic and branch vessel atherosclerosis better demonstrated by CT. IVC filter noted. Other: No ascites or peritoneal nodularity. Musculoskeletal: No acute or significant osseous findings. IMPRESSION: 1. Large conglomerate central hepatic metastases likely contribute to extrinsic compression of the biliary system in the porta hepatis, contributing to the patient's elevated liver function studies. The common bile duct is decompressed. 2. Probable metastatic lymphadenopathy within the porta hepatis. 3. Probable malignant left pleural effusion. 4. Stable bilateral adrenal adenomas or hyperplasia. Electronically Signed   By: WRichardean SaleM.D.   On: 02/09/2017 13:31   [place]dom[place] Limited Ruq  Result Date:  02/09/2017 CLINICAL DATA:  Initial evaluation for acute right upper quadrant and back pain. EXAM: ULTRASOUND ABDOMEN LIMITED RIGHT UPPER QUADRANT COMPARISON:  Prior CT from 02/04/2017. FINDINGS: Gallbladder: Gallbladder contracted. No internal cholelithiasis. Gallbladder wall measure 2.6 mm. No free pericholecystic fluid. No sonographic [place] sign elicited on exam. Common bile duct: Diameter: 5 mm Liver: Multiple liver lesions present, largest of which present at the right hepatic lobe measures 9.2 x 8.2 x 8.2 cm. Findings consistent with known metastatic disease. No made of a few small right renal cyst. IMPRESSION: 1. Contracted gallbladder without evidence for cholelithiasis, acute cholecystitis, or biliary dilatation. 2. Multiple hepatic lesions, consistent with known hepatic metastases. Electronically Signed   By: BJeannine BogaM.D.   On: 02/09/2017 00:33      Impression /  Plan:   TIRZA SENTENO is a 66 y.o. y/o female with metastatic breast cancer involving the liver. I have been consulted for an elevated bilirubin . (D bilirubin of 7 ). MRCP shows possible extrinsic compression from the liver met at the level of the porta hepatis. She is on Eloquis and last dose was taken early this morning at 1 am . I have discussed the patient with Dr Allen Norris and the plan is to attempt an ERCP on his schedule to try and place a biliary stent past the porta hepatis with the aim to improve bile flow.   Plan  1. ERCP Friday close to 36 hours off Eloquis   I have discussed alternative options, risks & benefits,  which include, but are not limited to, bleeding, infection, perforation,respiratory complication & drug reaction.  The patient agrees with this plan & written consent will be obtained.      Thank you for involving me in the care of this patient.      LOS: 0 days   Dawn Bellows, MD  02/09/2017, 5:15 PM

## 2017-02-10 LAB — GLUCOSE, CAPILLARY
Glucose-Capillary: 118 mg/dL — ABNORMAL HIGH (ref 65–99)
Glucose-Capillary: 173 mg/dL — ABNORMAL HIGH (ref 65–99)
Glucose-Capillary: 104 mg/dL — ABNORMAL HIGH (ref 65–99)
Glucose-Capillary: 124 mg/dL — ABNORMAL HIGH (ref 65–99)

## 2017-02-10 LAB — HEMOGLOBIN A1C
Hgb A1c MFr Bld: 6.6 % — ABNORMAL HIGH (ref 4.8–5.6)
Mean Plasma Glucose: 143 mg/dL

## 2017-02-10 MED ORDER — CIPROFLOXACIN IN D5W 400 MG/200ML IV SOLN
400.0000 mg | Freq: Once | INTRAVENOUS | Status: DC
  Filled 2017-02-10: qty 200

## 2017-02-10 MED ORDER — SODIUM CHLORIDE 0.9 % IV SOLN: INTRAVENOUS | Status: DC

## 2017-02-10 NOTE — Progress Notes (Addendum)
[facility] [place] NAME: Dawn Allen    MR#:  [id number]  DATE OF BIRTH:  [birth year]  SUBJECTIVE:   Came in with back pain and abdominal pain REVIEW OF SYSTEMS:   Review of Systems  Constitutional: Positive for malaise/fatigue and weight loss. Negative for chills and fever.  HENT: Negative for ear discharge, ear pain and nosebleeds.   Eyes: Negative for blurred vision, pain and discharge.  Respiratory: Negative for sputum production, shortness of breath, wheezing and stridor.   Cardiovascular: Negative for chest pain, palpitations, orthopnea and PND.  Gastrointestinal: Positive for abdominal pain. Negative for diarrhea, nausea and vomiting.  Genitourinary: Negative for frequency and urgency.  Musculoskeletal: Positive for back pain. Negative for joint pain.  Neurological: Positive for weakness. Negative for sensory change, speech change and focal weakness.  Psychiatric/Behavioral: Negative for depression and hallucinations. The patient is not nervous/anxious.    Tolerating Diet: soft diet Tolerating FY:[id number]  DRUG ALLERGIES:   Allergies  Allergen Reactions  . Penicillins Swelling    rash    VITALS:  Blood pressure (!) 144/70, pulse 91, temperature 98.9 F (37.2 C), temperature source Oral, resp. rate 18, height 5\' 5"  (1.651 m), weight 71.5 kg (157 lb 9.6 oz), SpO2 95 %.  PHYSICAL EXAMINATION:   Physical Exam  GENERAL:  66 y.o.-year-old patient lying in the bed with no acute distress. Appears ill EYES: Pupils equal, round, reactive to light and accommodation. ++ scleral icterus. Extraocular muscles intact.  HEENT: Head atraumatic, normocephalic. Oropharynx and nasopharynx clear.  NECK:  Supple, no jugular venous distention. No thyroid enlargement, no tenderness.  LUNGS: Normal breath sounds bilaterally, no wheezing, rales, rhonchi. No use of accessory muscles of respiration.  CARDIOVASCULAR: S1, S2 normal. No  murmurs, rubs, or gallops.  ABDOMEN: Soft, nontender, nondistended. Bowel sounds present. No organomegaly or mass.  EXTREMITIES: No cyanosis, clubbing or edema b/l.    NEUROLOGIC: Cranial nerves II through XII are intact. No focal Motor or sensory deficits b/l.   PSYCHIATRIC:  patient is alert and oriented x 3.  SKIN: No obvious rash, lesion, or ulcer.   LABORATORY PANEL:  CBC  Recent Labs Lab 02/08/17 2159  WBC 8.3  HGB 13.2  HCT 39.1  PLT 187    Chemistries   Recent Labs Lab 02/08/17 2159  NA 128*  K 2.9*  CL 90*  CO2 25  GLUCOSE 142*  BUN 28*  CREATININE 0.73  CALCIUM 8.8*  AST 156*  ALT 124*  ALKPHOS 1,171*  BILITOT 12.0*   Cardiac Enzymes  Recent Labs Lab 02/08/17 2159  TROPONINI 0.06*   RADIOLOGY:  Mr 3d Recon At Scanner  Result Date: 02/09/2017 CLINICAL DATA:  Right breast cancer with hepatic metastatic disease. Elevated liver function studies. EXAM: MRI ABDOMEN WITHOUT AND WITH CONTRAST (INCLUDING MRCP) TECHNIQUE: Multiplanar multisequence MR imaging of the abdomen was performed both before and after the administration of intravenous contrast. Heavily T2-weighted images of the biliary and pancreatic ducts were obtained, and three-dimensional MRCP images were rendered by post processing. CONTRAST:  52mL MULTIHANCE GADOBENATE DIMEGLUMINE 529 MG/ML IV SOLN COMPARISON:  PET-CT 10/13/2016.  Abdominal CT 02/04/2017. FINDINGS: Lower chest: Complex left pleural effusion is again partially imaged with irregular pleural thickening and enhancement, likely malignant. No significant pleural fluid on the right. Grossly stable fluid collection laterally in the right breast without suspicious enhancement. Hepatobiliary: Again demonstrated is widespread hepatic metastatic disease with a large conglomerate mass centrally in the liver measuring up  to 11.9 x 10.0 x 13.1 cm. This partially encases and exerts mass effect on the hepatic and portal veins as well as the IVC. No tumor  thrombus identified. There is probable extrinsic compression of the biliary system in the hepatic hilum. Mild intrahepatic biliary dilatation is present. The common bile duct is decompressed. The gallbladder is collapsed. Apart from the dominant central liver mass, there are multiple smaller lesions, similar to recent CT. Pancreas: Unremarkable. No pancreatic ductal dilatation or surrounding inflammatory changes. Spleen: Normal in size without focal abnormality. Adrenals/Urinary Tract: Small bilateral adrenal masses are grossly stable from previous studies and were not hypermetabolic on PET-CT, probably adenomas or hyperplasia. There are several cysts within the lower pole of the right kidney. The left kidney appears normal. No hydronephrosis. Stomach/Bowel: No evidence of bowel wall thickening, distention or surrounding inflammatory change. Vascular/Lymphatic: Stable adenopathy within the porta hepatis, potentially contributing to mass effect on the biliary system. No acute vascular findings. As above, there is extrinsic mass effect on the intrahepatic vasculature by the large central hepatic mass. Aortic and branch vessel atherosclerosis better demonstrated by CT. IVC filter noted. Other: No ascites or peritoneal nodularity. Musculoskeletal: No acute or significant osseous findings. IMPRESSION: 1. Large conglomerate central hepatic metastases likely contribute to extrinsic compression of the biliary system in the porta hepatis, contributing to the patient's elevated liver function studies. The common bile duct is decompressed. 2. Probable metastatic lymphadenopathy within the porta hepatis. 3. Probable malignant left pleural effusion. 4. Stable bilateral adrenal adenomas or hyperplasia. Electronically Signed   By: Richardean Sale M.D.   On: 02/09/2017 13:31   Mr Abdomen Mrcp Moise Boring Contast  Result Date: 02/09/2017 CLINICAL DATA:  Right breast cancer with hepatic metastatic disease. Elevated liver function  studies. EXAM: MRI ABDOMEN WITHOUT AND WITH CONTRAST (INCLUDING MRCP) TECHNIQUE: Multiplanar multisequence MR imaging of the abdomen was performed both before and after the administration of intravenous contrast. Heavily T2-weighted images of the biliary and pancreatic ducts were obtained, and three-dimensional MRCP images were rendered by post processing. CONTRAST:  26mL MULTIHANCE GADOBENATE DIMEGLUMINE 529 MG/ML IV SOLN COMPARISON:  PET-CT 10/13/2016.  Abdominal CT 02/04/2017. FINDINGS: Lower chest: Complex left pleural effusion is again partially imaged with irregular pleural thickening and enhancement, likely malignant. No significant pleural fluid on the right. Grossly stable fluid collection laterally in the right breast without suspicious enhancement. Hepatobiliary: Again demonstrated is widespread hepatic metastatic disease with a large conglomerate mass centrally in the liver measuring up to 11.9 x 10.0 x 13.1 cm. This partially encases and exerts mass effect on the hepatic and portal veins as well as the IVC. No tumor thrombus identified. There is probable extrinsic compression of the biliary system in the hepatic hilum. Mild intrahepatic biliary dilatation is present. The common bile duct is decompressed. The gallbladder is collapsed. Apart from the dominant central liver mass, there are multiple smaller lesions, similar to recent CT. Pancreas: Unremarkable. No pancreatic ductal dilatation or surrounding inflammatory changes. Spleen: Normal in size without focal abnormality. Adrenals/Urinary Tract: Small bilateral adrenal masses are grossly stable from previous studies and were not hypermetabolic on PET-CT, probably adenomas or hyperplasia. There are several cysts within the lower pole of the right kidney. The left kidney appears normal. No hydronephrosis. Stomach/Bowel: No evidence of bowel wall thickening, distention or surrounding inflammatory change. Vascular/Lymphatic: Stable adenopathy within the  porta hepatis, potentially contributing to mass effect on the biliary system. No acute vascular findings. As above, there is extrinsic mass effect on the  intrahepatic vasculature by the large central hepatic mass. Aortic and branch vessel atherosclerosis better demonstrated by CT. IVC filter noted. Other: No ascites or peritoneal nodularity. Musculoskeletal: No acute or significant osseous findings. IMPRESSION: 1. Large conglomerate central hepatic metastases likely contribute to extrinsic compression of the biliary system in the porta hepatis, contributing to the patient's elevated liver function studies. The common bile duct is decompressed. 2. Probable metastatic lymphadenopathy within the porta hepatis. 3. Probable malignant left pleural effusion. 4. Stable bilateral adrenal adenomas or hyperplasia. Electronically Signed   By: Richardean Sale M.D.   On: 02/09/2017 13:31   US Abdomen Limited Ruq  Result Date: 02/09/2017 CLINICAL DATA:  Initial evaluation for acute right upper quadrant and back pain. EXAM: ULTRASOUND ABDOMEN LIMITED RIGHT UPPER QUADRANT COMPARISON:  Prior CT from 02/04/2017. FINDINGS: Gallbladder: Gallbladder contracted. No internal cholelithiasis. Gallbladder wall measure 2.6 mm. No free pericholecystic fluid. No sonographic Haislip sign elicited on exam. Common bile duct: Diameter: 5 mm Liver: Multiple liver lesions present, largest of which present at the right hepatic lobe measures 9.2 x 8.2 x 8.2 cm. Findings consistent with known metastatic disease. No made of a few small right renal cyst. IMPRESSION: 1. Contracted gallbladder without evidence for cholelithiasis, acute cholecystitis, or biliary dilatation. 2. Multiple hepatic lesions, consistent with known hepatic metastases. Electronically Signed   By: Jeannine Boga M.D.   On: 02/09/2017 00:33   ASSESSMENT AND PLAN:  66 year old female admitted for cholestatic jaundice.  1. Hyperbilirubinemia: Cholestatic likely secondary to  metastasis from tumor burden -MRCP today -GI consult-spoke with Dr Kendrick Fries for ERCp tomrrow. Pt apparently received eliquis on Tuesday at 1:30 am  2. Breast cancer: Stage IV - Patient receiving palliative chemo with Taxol chemotherapy. Manage pain.  3. Coronary artery disease: - patient does not complain of chest pain however troponin is mildly elevated. EKG shows no indication of myocardial ischemia.   4. Hyponatremia: Associated with secondary lung cancer. Hypokalemia also present. Replete electrolytes; hydrate with normal saline with supplemental potassium.  5. Diabetes mellitus type 2: Sliding scale insulin while hospitalized. Hold oral hypoglycemic agents.   8. Hypertension: Controlled; continue lisinopril -d/c hydrochlorothiazide due to low na and K  9. Seizure disorder: Continue Dilantin per home regimen  10. DVT prophylaxis: Eliquis for history of thrombosis (will hold for possible need for ERCP).   Pt declined PT consult.  Case discussed with Care Management/Social Worker. Management plans discussed with the patient, family and they are in agreement.  CODE STATUS: full  TOTAL TIME TAKING CARE OF THIS PATIENT: 30 minutes.  >50% time spent on counselling and coordination of care  POSSIBLE D/C IN 1-2 DAYS, DEPENDING ON CLINICAL CONDITION.  Note: This dictation was prepared with Dragon dictation along with smaller phrase technology. Any transcriptional errors that result from this process are unintentional.  Anja Neuzil M.D on 02/10/2017 at 8:46 AM  Between 7am to 6pm - Pager - [phone]  After 6pm go to [email or web address] - password [email or web address] [facility]  Office  [phone]  CC: Primary care physician; Lorelee Market, MD

## 2017-02-10 NOTE — Progress Notes (Signed)
PT Cancellation Note  Patient Details Name: TIERA MENSINGER MRN: [id number] DOB: 09-03-1950   Cancelled Treatment:    Reason Eval/Treat Not Completed: Patient declined, no reason specified.  PT consult received.  Chart reviewed.  Upon PT entry and introduction to pt on role/purpose of PT consult, pt stating "Honey, there is nothing wrong with me" and firmly declining PT consult/evaluation.  Nursing reporting pt has been ambulating to bathroom.  MD notified of above and PT consult was cancelled.  Please re-consult PT if pt's status changes and pt is agreeable to PT consult.  Leitha Bleak, PT 02/10/17, 9:23 AM [phone]

## 2017-02-11 ENCOUNTER — Encounter
Admission: EM | Disposition: A | Discharge status: Skilled Nursing Facility | Payer: Self-pay | Source: Home / Self Care | Attending: Internal Medicine

## 2017-02-11 ENCOUNTER — Inpatient Hospital Stay: Payer: Medicare Other | Admitting: Anesthesiology

## 2017-02-11 ENCOUNTER — Inpatient Hospital Stay: Payer: Medicare Other

## 2017-02-11 ENCOUNTER — Inpatient Hospital Stay: Payer: Medicare Other | Admitting: Hematology and Oncology

## 2017-02-11 DIAGNOSIS — C787 Secondary malignant neoplasm of liver and intrahepatic bile duct: Principal | ICD-10-CM

## 2017-02-11 DIAGNOSIS — R17 Unspecified jaundice: Secondary | ICD-10-CM

## 2017-02-11 HISTORY — PX: ENDOSCOPIC RETROGRADE CHOLANGIOPANCREATOGRAPHY (ERCP) WITH PROPOFOL: SHX5810

## 2017-02-11 LAB — GLUCOSE, CAPILLARY
Glucose-Capillary: 127 mg/dL — ABNORMAL HIGH (ref 65–99)
Glucose-Capillary: 103 mg/dL — ABNORMAL HIGH (ref 65–99)
Glucose-Capillary: 163 mg/dL — ABNORMAL HIGH (ref 65–99)
Glucose-Capillary: 112 mg/dL — ABNORMAL HIGH (ref 65–99)

## 2017-02-11 SURGERY — ENDOSCOPIC RETROGRADE CHOLANGIOPANCREATOGRAPHY (ERCP) WITH PROPOFOL
Anesthesia: General

## 2017-02-11 MED ORDER — LACTULOSE 10 GM/15ML PO SOLN: 20.0000 g | Freq: Every day | ORAL | Status: DC | PRN

## 2017-02-11 MED ORDER — PROPOFOL 10 MG/ML IV BOLUS
INTRAVENOUS | Status: DC | PRN
  Administered 2017-02-11: 80 mg via INTRAVENOUS

## 2017-02-11 MED ORDER — POTASSIUM CHLORIDE 20 MEQ PO PACK
20.0000 meq | PACK | Freq: Every day | ORAL | Status: DC
  Administered 2017-02-12 – 2017-02-13 (×2): 20 meq via ORAL
  Filled 2017-02-11 (×2): qty 1

## 2017-02-11 MED ORDER — GLUCAGON HCL RDNA (DIAGNOSTIC) 1 MG IJ SOLR
INTRAMUSCULAR | Status: AC
  Filled 2017-02-11: qty 1

## 2017-02-11 MED ORDER — PROPOFOL 500 MG/50ML IV EMUL
INTRAVENOUS | Status: AC
  Filled 2017-02-11: qty 50

## 2017-02-11 MED ORDER — INDOMETHACIN 50 MG RE SUPP
100.0000 mg | Freq: Once | RECTAL | Status: AC
  Administered 2017-02-11: 100 mg via RECTAL

## 2017-02-11 MED ORDER — PROPOFOL 500 MG/50ML IV EMUL
INTRAVENOUS | Status: DC | PRN
  Administered 2017-02-11: 140 ug/kg/min via INTRAVENOUS

## 2017-02-11 MED ORDER — GLUCAGON HCL RDNA (DIAGNOSTIC) 1 MG IJ SOLR
INTRAMUSCULAR | Status: DC | PRN
  Administered 2017-02-11: .5 mg via INTRAVENOUS

## 2017-02-11 MED ORDER — CIPROFLOXACIN IN D5W 400 MG/200ML IV SOLN
400.0000 mg | Freq: Once | INTRAVENOUS | Status: AC
  Administered 2017-02-11: 400 mg via INTRAVENOUS

## 2017-02-11 MED ORDER — SODIUM CHLORIDE 0.9 % IV SOLN: INTRAVENOUS | Status: DC

## 2017-02-11 NOTE — Progress Notes (Signed)
[facility] [place] NAME: Dawn Allen    MR#:  [id number]  DATE OF BIRTH:  March 20, 1951  SUBJECTIVE:   Came in with back pain and abdominal pain REVIEW OF SYSTEMS:   Review of Systems  Constitutional: Positive for malaise/fatigue and weight loss. Negative for chills and fever.  HENT: Negative for ear discharge, ear pain and nosebleeds.   Eyes: Negative for blurred vision, pain and discharge.  Respiratory: Negative for sputum production, shortness of breath, wheezing and stridor.   Cardiovascular: Negative for chest pain, palpitations, orthopnea and PND.  Gastrointestinal: Positive for abdominal pain. Negative for diarrhea, nausea and vomiting.  Genitourinary: Negative for frequency and urgency.  Musculoskeletal: Positive for back pain. Negative for joint pain.  Neurological: Positive for weakness. Negative for sensory change, speech change and focal weakness.  Psychiatric/Behavioral: Negative for depression and hallucinations. The patient is not nervous/anxious.    Tolerating Diet: soft diet Tolerating GU:[id number]  DRUG ALLERGIES:   Allergies  Allergen Reactions  . Penicillins Swelling    rash    VITALS:  Blood pressure (!) 157/82, pulse 88, temperature 97.9 F (36.6 C), resp. rate 18, height 5\' 5"  (1.651 m), weight 71.7 kg (158 lb), SpO2 97 %.  PHYSICAL EXAMINATION:   Physical Exam  GENERAL:  66 y.o.-year-old patient lying in the bed with no acute distress. Appears ill EYES: Pupils equal, round, reactive to light and accommodation. ++ scleral icterus. Extraocular muscles intact.  HEENT: Head atraumatic, normocephalic. Oropharynx and nasopharynx clear.  NECK:  Supple, no jugular venous distention. No thyroid enlargement, no tenderness.  LUNGS: Normal breath sounds bilaterally, no wheezing, rales, rhonchi. No use of accessory muscles of respiration.  CARDIOVASCULAR: S1, S2 normal. No murmurs, rubs, or gallops.   ABDOMEN: Soft, nontender, nondistended. Bowel sounds present. No organomegaly or mass.  EXTREMITIES: No cyanosis, clubbing or edema b/l.    NEUROLOGIC: Cranial nerves II through XII are intact. No focal Motor or sensory deficits b/l.   PSYCHIATRIC:  patient is alert and oriented x 3.  SKIN: No obvious rash, lesion, or ulcer.   LABORATORY PANEL:  CBC  Recent Labs Lab 02/08/17 2159  WBC 8.3  HGB 13.2  HCT 39.1  PLT 187    Chemistries   Recent Labs Lab 02/08/17 2159  NA 128*  K 2.9*  CL 90*  CO2 25  GLUCOSE 142*  BUN 28*  CREATININE 0.73  CALCIUM 8.8*  AST 156*  ALT 124*  ALKPHOS 1,171*  BILITOT 12.0*   Cardiac Enzymes  Recent Labs Lab 02/08/17 2159  TROPONINI 0.06*   RADIOLOGY:  Mr 3d Recon At Scanner  Result Date: 02/09/2017 CLINICAL DATA:  Right breast cancer with hepatic metastatic disease. Elevated liver function studies. EXAM: MRI ABDOMEN WITHOUT AND WITH CONTRAST (INCLUDING MRCP) TECHNIQUE: Multiplanar multisequence MR imaging of the abdomen was performed both before and after the administration of intravenous contrast. Heavily T2-weighted images of the biliary and pancreatic ducts were obtained, and three-dimensional MRCP images were rendered by post processing. CONTRAST:  66mL MULTIHANCE GADOBENATE DIMEGLUMINE 529 MG/ML IV SOLN COMPARISON:  PET-CT 10/13/2016.  Abdominal CT 02/04/2017. FINDINGS: Lower chest: Complex left pleural effusion is again partially imaged with irregular pleural thickening and enhancement, likely malignant. No significant pleural fluid on the right. Grossly stable fluid collection laterally in the right breast without suspicious enhancement. Hepatobiliary: Again demonstrated is widespread hepatic metastatic disease with a large conglomerate mass centrally in the liver measuring up to 11.9 x 10.0 x  13.1 cm. This partially encases and exerts mass effect on the hepatic and portal veins as well as the IVC. No tumor thrombus identified. There is  probable extrinsic compression of the biliary system in the hepatic hilum. Mild intrahepatic biliary dilatation is present. The common bile duct is decompressed. The gallbladder is collapsed. Apart from the dominant central liver mass, there are multiple smaller lesions, similar to recent CT. Pancreas: Unremarkable. No pancreatic ductal dilatation or surrounding inflammatory changes. Spleen: Normal in size without focal abnormality. Adrenals/Urinary Tract: Small bilateral adrenal masses are grossly stable from previous studies and were not hypermetabolic on PET-CT, probably adenomas or hyperplasia. There are several cysts within the lower pole of the right kidney. The left kidney appears normal. No hydronephrosis. Stomach/Bowel: No evidence of bowel wall thickening, distention or surrounding inflammatory change. Vascular/Lymphatic: Stable adenopathy within the porta hepatis, potentially contributing to mass effect on the biliary system. No acute vascular findings. As above, there is extrinsic mass effect on the intrahepatic vasculature by the large central hepatic mass. Aortic and branch vessel atherosclerosis better demonstrated by CT. IVC filter noted. Other: No ascites or peritoneal nodularity. Musculoskeletal: No acute or significant osseous findings. IMPRESSION: 1. Large conglomerate central hepatic metastases likely contribute to extrinsic compression of the biliary system in the porta hepatis, contributing to the patient's elevated liver function studies. The common bile duct is decompressed. 2. Probable metastatic lymphadenopathy within the porta hepatis. 3. Probable malignant left pleural effusion. 4. Stable bilateral adrenal adenomas or hyperplasia. Electronically Signed   By: Richardean Sale M.D.   On: 02/09/2017 13:31   Mr Abdomen Mrcp Moise Boring Contast  Result Date: 02/09/2017 CLINICAL DATA:  Right breast cancer with hepatic metastatic disease. Elevated liver function studies. EXAM: MRI ABDOMEN WITHOUT  AND WITH CONTRAST (INCLUDING MRCP) TECHNIQUE: Multiplanar multisequence MR imaging of the abdomen was performed both before and after the administration of intravenous contrast. Heavily T2-weighted images of the biliary and pancreatic ducts were obtained, and three-dimensional MRCP images were rendered by post processing. CONTRAST:  43mL MULTIHANCE GADOBENATE DIMEGLUMINE 529 MG/ML IV SOLN COMPARISON:  PET-CT 10/13/2016.  Abdominal CT 02/04/2017. FINDINGS: Lower chest: Complex left pleural effusion is again partially imaged with irregular pleural thickening and enhancement, likely malignant. No significant pleural fluid on the right. Grossly stable fluid collection laterally in the right breast without suspicious enhancement. Hepatobiliary: Again demonstrated is widespread hepatic metastatic disease with a large conglomerate mass centrally in the liver measuring up to 11.9 x 10.0 x 13.1 cm. This partially encases and exerts mass effect on the hepatic and portal veins as well as the IVC. No tumor thrombus identified. There is probable extrinsic compression of the biliary system in the hepatic hilum. Mild intrahepatic biliary dilatation is present. The common bile duct is decompressed. The gallbladder is collapsed. Apart from the dominant central liver mass, there are multiple smaller lesions, similar to recent CT. Pancreas: Unremarkable. No pancreatic ductal dilatation or surrounding inflammatory changes. Spleen: Normal in size without focal abnormality. Adrenals/Urinary Tract: Small bilateral adrenal masses are grossly stable from previous studies and were not hypermetabolic on PET-CT, probably adenomas or hyperplasia. There are several cysts within the lower pole of the right kidney. The left kidney appears normal. No hydronephrosis. Stomach/Bowel: No evidence of bowel wall thickening, distention or surrounding inflammatory change. Vascular/Lymphatic: Stable adenopathy within the porta hepatis, potentially  contributing to mass effect on the biliary system. No acute vascular findings. As above, there is extrinsic mass effect on the intrahepatic vasculature by the large  central hepatic mass. Aortic and branch vessel atherosclerosis better demonstrated by CT. IVC filter noted. Other: No ascites or peritoneal nodularity. Musculoskeletal: No acute or significant osseous findings. IMPRESSION: 1. Large conglomerate central hepatic metastases likely contribute to extrinsic compression of the biliary system in the porta hepatis, contributing to the patient's elevated liver function studies. The common bile duct is decompressed. 2. Probable metastatic lymphadenopathy within the porta hepatis. 3. Probable malignant left pleural effusion. 4. Stable bilateral adrenal adenomas or hyperplasia. Electronically Signed   By: Richardean Sale M.D.   On: 02/09/2017 13:31   ASSESSMENT AND PLAN:  66 year old female admitted for cholestatic jaundice.  1. Hyperbilirubinemia: Cholestatic likely secondary to metastasis from tumor burden -MRCP showed large central liver mass likely compressing biliary duct -GI consult-spoke with Dr Kendrick Fries for ERCp today to place stent Pt apparently received eliquis on Tuesday at 1:30 am  2. Breast cancer: Stage IV - Patient receiving palliative chemo with Taxol chemotherapy. Manage pain.  3. Coronary artery disease: - patient does not complain of chest pain however troponin is mildly elevated. EKG shows no indication of myocardial ischemia.   4. Hyponatremia: Associated with secondary lung cancer. Hypokalemia also present. Replete electrolytes; hydrate with normal saline with supplemental potassium.  5. Diabetes mellitus type 2: Sliding scale insulin while hospitalized. Hold oral hypoglycemic agents.   8. Hypertension: Controlled; continue lisinopril -d/c hydrochlorothiazide due to low na and K  9. Seizure disorder: Continue Dilantin per home regimen  10. DVT prophylaxis: Eliquis for  history of thrombosis (will hold for possible need for ERCP).   Pt declined PT consult.  Case discussed with Care Management/Social Worker. Management plans discussed with the patient, family and they are in agreement.  CODE STATUS: full  TOTAL TIME TAKING CARE OF THIS PATIENT: 30 minutes.  >50% time spent on counselling and coordination of care  POSSIBLE D/C IN 1-2 DAYS, DEPENDING ON CLINICAL CONDITION.  Note: This dictation was prepared with Dragon dictation along with smaller phrase technology. Any transcriptional errors that result from this process are unintentional.  Radek Carnero M.D on 02/11/2017 at 8:51 AM  Between 7am to 6pm - Pager - [phone]  After 6pm go to [email or web address] - password [email or web address] [facility]  Office  [phone]  CC: Primary care physician; Lorelee Market, MD

## 2017-02-11 NOTE — Care Management (Signed)
Admitted to this facility with the diagnosis of hyperbilirubinemia. Lives alone. Friend is Kern Reap [phone]). Dr. Brunetta Genera is listed as primary care physician. Last seen Samara Deist 08/2016. Last seen [name] 02/04/14. Chemotherapy 02/03/17, Port placed 01/18/17.  Bili Direct today is 7.1. IV Cipro continues. Sleeping, unable to complete assessment Shelbie Ammons RN MSN CCM Care Management [phone]

## 2017-02-11 NOTE — Anesthesia Post-op Follow-up Note (Cosign Needed)
Anesthesia QCDR form completed.        

## 2017-02-11 NOTE — Anesthesia Preprocedure Evaluation (Addendum)
Anesthesia Evaluation  Patient identified by MRN, date of birth, ID band Patient awake    Reviewed: Allergy & Precautions, NPO status , Patient's Chart, lab work & pertinent test results  History of Anesthesia Complications Negative for: history of anesthetic complications  Airway Mallampati: II  TM Distance: >3 FB Neck ROM: Full    Dental  (+) Poor Dentition   Pulmonary neg sleep apnea, neg COPD, Current Smoker,    breath sounds clear to auscultation- rhonchi (-) wheezing      Cardiovascular hypertension, + CAD, + Past MI and + Cardiac Stents (2015)   Rhythm:Regular Rate:Normal - Systolic murmurs and - Diastolic murmurs    Neuro/Psych Seizures -,  Anxiety    GI/Hepatic negative GI ROS, Neg liver ROS,   Endo/Other  diabetes, Oral Hypoglycemic Agents  Renal/GU negative Renal ROS     Musculoskeletal negative musculoskeletal ROS (+)   Abdominal (+) - obese,   Peds  Hematology negative hematology ROS (+)   Anesthesia Other Findings Past Medical History: No date: Anxiety 08/2015: Breast cancer ([place])     Comment: right breast, chemo No date: Cancer of right female breast ([place]) No date: Coronary artery disease     Comment: a. NSTEMI cath 08/02/14: LM nl, pLAD 50%, mLCx              30%, mRCA 95% s/p PCI/DES, 2nd lesion 40%, EF               55% No date: Diabetes mellitus without complication ([facility]) No date: DVT (deep venous thrombosis) ([facility])     Comment: LEFT LEG  No date: HLD (hyperlipidemia) No date: Hypertension No date: Lung cancer ([place]) No date: Lung nodule No date: MI (myocardial infarction) ([place]) No date: Seizure disorder ([place]) No date: Seizures ([place]) 04/23/2015: Squamous cell lung cancer ([facility])   Reproductive/Obstetrics                             Anesthesia Physical Anesthesia Plan  ASA: III  Anesthesia Plan: General   Post-op Pain Management:    Induction:  Intravenous  PONV Risk Score and Plan: 1 and Propofol  Airway Management Planned: Natural Airway  Additional Equipment:   Intra-op Plan:   Post-operative Plan:   Informed Consent: I have reviewed the patients History and Physical, chart, labs and discussed the procedure including the risks, benefits and alternatives for the proposed anesthesia with the patient or authorized representative who has indicated his/her understanding and acceptance.   Dental advisory given  Plan Discussed with: CRNA and Anesthesiologist  Anesthesia Plan Comments:        Anesthesia Quick Evaluation

## 2017-02-11 NOTE — Plan of Care (Signed)
Problem: Pain Managment: Goal: General experience of comfort will improve Outcome: Not Progressing Continues c/o abdominal pain, relieved with dilaudid prn, pt NPO for ERCP in am, continue to monitor

## 2017-02-11 NOTE — Transfer of Care (Signed)
Immediate Anesthesia Transfer of Care Note  Patient: Dawn Allen  Procedure(s) Performed: Procedure(s): ENDOSCOPIC RETROGRADE CHOLANGIOPANCREATOGRAPHY (ERCP) WITH PROPOFOL (N/A)  Patient Location: PACU and Endoscopy Unit  Anesthesia Type:General  Level of Consciousness: drowsy and patient cooperative  Airway & Oxygen Therapy: Patient Spontanous Breathing and Patient connected to nasal cannula oxygen  Post-op Assessment: Report given to RN and Post -op Vital signs reviewed and stable  Post vital signs: Reviewed and stable  Last Vitals:  Vitals:   02/11/17 1307 02/11/17 1511  BP: (!) 141/73 134/69  Pulse: 89 88  Resp: 20 (!) 22  Temp: 36.9 C     Last Pain:  Vitals:   02/11/17 1307  TempSrc: Tympanic  PainSc: 7       Patients Stated Pain Goal: 0 (76/28/31 5176)  Complications: No apparent anesthesia complications

## 2017-02-11 NOTE — Op Note (Addendum)
[facility]enter[facility] Patient Name: Dawn Allen Procedure Date: 02/11/2017 2:17 PM MRN: [id number] Account #: [id number] Date of Birth: May 05, 1951 Admit Type: Inpatient Age: 66 Room: [facility] ENDO ROOM 4 Gender: Female Note Status: Finalized Procedure:            ERCP Indications:          Jaundice from metastatic breast cancer. Providers:            Lucilla Lame MD, MD Referring MD:         Ruffin Frederick. Brunetta Genera, MD (Referring MD) Medicines:            Propofol per Anesthesia Complications:        The etire left system was not seen due to tumor. Procedure:            Pre-Anesthesia Assessment:                       - Prior to the procedure, a History and Physical was                        performed, and patient medications and allergies were                        reviewed. The patient's tolerance of previous                        anesthesia was also reviewed. The risks and benefits of                        the procedure and the sedation options and risks were                        discussed with the patient. All questions were                        answered, and informed consent was obtained. Prior                        Anticoagulants: The patient has taken no previous                        anticoagulant or antiplatelet agents. ASA Grade                        Assessment: II - A patient with mild systemic disease.                        After reviewing the risks and benefits, the patient was                        deemed in satisfactory condition to undergo the                        procedure.                       After obtaining informed consent, the scope was passed                        under direct vision. Throughout the procedure, the  patient's blood pressure, pulse, and oxygen saturations                        were monitored continuously. The Endosonoscope was                        introduced through the mouth, and  used to inject                        contrast into and used to inject contrast into the bile                        duct. The ERCP was accomplished without difficulty. The                        patient tolerated the procedure well. Findings:      The scout film was normal. The esophagus was successfully intubated       under direct vision. The scope was advanced to a normal major papilla in       the descending duodenum without detailed examination of the pharynx,       larynx and associated structures, and upper GI tract. The upper GI tract       was grossly normal. The bile duct was deeply cannulated with the       short-nosed traction sphincterotome. Contrast was injected. I personally       interpreted the bile duct images. There was brisk flow of contrast       through the ducts. Image quality was excellent. Contrast extended to the       entire biliary tree. The hepatic duct bifurcation and left and right       hepatic ducts and all intrahepatic branches contained multiple diffuse       stenoses. A wire was passed into the biliary tree. A 5 mm biliary       sphincterotomy was made with a traction (standard) sphincterotome using       ERBE electrocautery. There was no post-sphincterotomy bleeding. One 7 Fr       by 9 cm plastic stent with a single external flap and a single internal       flap was placed 7 cm into the common bile duct. The stent was in good       position. 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                       - A biliary sphincterotomy was performed.                       - One plastic stent was placed into the common bile                        duct. Recommendation:       - Return patient to hospital ward for ongoing care.                       - Clear liquid diet today.                       -  Repeat ERCP in 3 months to remove stent. Procedure Code(s):    ---  Professional ---                       [id number], Endoscopic retrograde cholangiopancreatography                        (ERCP); with placement of endoscopic stent into biliary                        or pancreatic duct, including pre- and post-dilation                        and guide wire passage, when performed, including                        sphincterotomy, when performed, each stent                       60045, Endoscopic catheterization of the biliary ductal                        system, radiological supervision and interpretation Diagnosis Code(s):    --- Professional ---                       K83.1, Obstruction of bile duct                       R17, Unspecified jaundice CPT copyright 2016 American Medical Association. All rights reserved. The codes documented in this report are preliminary and upon coder review may  be revised to meet current compliance requirements. Lucilla Lame MD, MD 02/11/2017 3:03:06 PM This report has been signed electronically. Number of Addenda: 0 Note Initiated On: 02/11/2017 2:17 PM      [facility]

## 2017-02-11 NOTE — Anesthesia Postprocedure Evaluation (Signed)
Anesthesia Post Note  Patient: Dawn Allen  Procedure(s) Performed: Procedure(s) (LRB): ENDOSCOPIC RETROGRADE CHOLANGIOPANCREATOGRAPHY (ERCP) WITH PROPOFOL (N/A)  Patient location during evaluation: Endoscopy Anesthesia Type: General Level of consciousness: awake and alert Pain management: pain level controlled Vital Signs Assessment: post-procedure vital signs reviewed and stable Respiratory status: spontaneous breathing, nonlabored ventilation, respiratory function stable and patient connected to nasal cannula oxygen Cardiovascular status: blood pressure returned to baseline and stable Postop Assessment: no signs of nausea or vomiting Anesthetic complications: no     Last Vitals:  Vitals:   02/11/17 1510 02/11/17 1511  BP: 134/69 134/69  Pulse: 89 88  Resp: 16 (!) 22  Temp: 37.4 C     Last Pain:  Vitals:   02/11/17 1510  TempSrc: Tympanic  PainSc:                  Dawn Allen S

## 2017-02-12 LAB — COMPREHENSIVE METABOLIC PANEL
ALT: 89 U/L — ABNORMAL HIGH (ref 14–54)
AST: 169 U/L — AB (ref 15–41)
Albumin: 2.2 g/dL — ABNORMAL LOW (ref 3.5–5.0)
Alkaline Phosphatase: 995 U/L — ABNORMAL HIGH (ref 38–126)
Anion gap: 8 (ref 5–15)
BUN: 21 mg/dL — AB (ref 6–20)
CHLORIDE: 94 mmol/L — AB (ref 101–111)
CO2: 26 mmol/L (ref 22–32)
Calcium: 8.9 mg/dL (ref 8.9–10.3)
Creatinine, Ser: 0.53 mg/dL (ref 0.44–1.00)
Glucose, Bld: 204 mg/dL — ABNORMAL HIGH (ref 65–99)
POTASSIUM: 4.8 mmol/L (ref 3.5–5.1)
Sodium: 128 mmol/L — ABNORMAL LOW (ref 135–145)
Total Bilirubin: 14.4 mg/dL — ABNORMAL HIGH (ref 0.3–1.2)
Total Protein: 6.8 g/dL (ref 6.5–8.1)

## 2017-02-12 LAB — CBC
HCT: 40.5 % (ref 35.0–47.0)
Hemoglobin: 13.4 g/dL (ref 12.0–16.0)
MCH: 29.7 pg (ref 26.0–34.0)
MCHC: 33.1 g/dL (ref 32.0–36.0)
MCV: 89.9 fL (ref 80.0–100.0)
PLATELETS: 155 10*3/uL (ref 150–440)
RBC: 4.51 MIL/uL (ref 3.80–5.20)
RDW: 17.5 % — AB (ref 11.5–14.5)
WBC: 8.2 10*3/uL (ref 3.6–11.0)

## 2017-02-12 LAB — PROTIME-INR
INR: 1.14
PROTHROMBIN TIME: 14.7 s (ref 11.4–15.2)

## 2017-02-12 LAB — GLUCOSE, POCT (MANUAL RESULT ENTRY): POC Glucose: 122 mg/dl — AB (ref 70–99)

## 2017-02-12 LAB — GLUCOSE, CAPILLARY
Glucose-Capillary: 141 mg/dL — ABNORMAL HIGH (ref 65–99)
Glucose-Capillary: 183 mg/dL — ABNORMAL HIGH (ref 65–99)
Glucose-Capillary: 122 mg/dL — ABNORMAL HIGH (ref 65–99)
Glucose-Capillary: 177 mg/dL — ABNORMAL HIGH (ref 65–99)

## 2017-02-12 LAB — LIPASE, BLOOD: LIPASE: 137 U/L — AB (ref 11–51)

## 2017-02-12 MED ORDER — ENOXAPARIN SODIUM 120 MG/0.8ML ~~LOC~~ SOLN
1.5000 mg/kg | SUBCUTANEOUS | Status: DC
  Administered 2017-02-12: 17:00:00 110 mg via SUBCUTANEOUS
  Filled 2017-02-12: qty 0.8

## 2017-02-12 MED ORDER — APIXABAN 2.5 MG PO TABS: 2.5000 mg | ORAL_TABLET | Freq: Two times a day (BID) | ORAL | Status: DC

## 2017-02-12 MED ORDER — SODIUM CHLORIDE 0.9 % IV SOLN
INTRAVENOUS | Status: DC
  Administered 2017-02-12: 14:00:00 via INTRAVENOUS

## 2017-02-12 NOTE — Progress Notes (Signed)
ANTICOAGULATION CONSULT NOTE - Initial Consult  Pharmacy Consult for enoxaparin Indication: History of VTE  Assessment: Patient with metastatic breast cancer admitted for elevated bilirubin found to have large liver mass.  Patient was on apixaban 2.5mg  PO BID PTA for history of recurrent VTE (2009 and 2014) as well as phenytoin for seizure disorder. Use of apixaban contraindicated with phenytoin and not recommended in the setting of hepatic impairment.   Discussed with MD, will transition to enoxaparin once daily dosing.  Plan: Enoxaparin 110mg  SQ Q24H  Allergies  Allergen Reactions  . Penicillins Swelling    rash    Patient Measurements: Height: 5\' 5"  (165.1 cm) Weight: 161 lb 3.2 oz (73.1 kg) IBW/kg (Calculated) : 57  Vital Signs: Temp: 98.3 F (36.8 C) (06/23 0344) Temp Source: Oral (06/23 0344) BP: 123/55 (06/23 0344) Pulse Rate: 89 (06/23 0344)  Labs:  Recent Labs  02/12/17 0945  CREATININE 0.53    Estimated Creatinine Clearance: 70.2 mL/min (by C-G formula based on SCr of 0.53 mg/dL).  Thank you for allowing pharmacy to participate in the care of this patient.  Rexene Edison, PharmD, BCPS Clinical Pharmacist  02/12/2017 12:02 PM

## 2017-02-12 NOTE — Progress Notes (Signed)
Patient ID: Dawn Allen, female   DOB: 1951-02-25, 66 y.o.   MRN: [id number]  [employer] PROGRESS NOTE  Dawn Allen OZD:[id number] DOB: 01-06-1951 DOA: 02/08/2017 PCP: Lorelee Market, MD  HPI/Subjective: Patient feels weak. Took some Dilaudid earlier. Patient complains that her urine is dark color. She is blaming it on the potassium that she received in the cancer center.  Objective: Vitals:   02/11/17 1945 02/12/17 0344  BP: 121/66 (!) 123/55  Pulse: 90 89  Resp: 20 20  Temp: 97.1 F (36.2 C) 98.3 F (36.8 C)    Filed Weights   02/10/17 0500 02/11/17 0500 02/12/17 0449  Weight: 71.5 kg (157 lb 9.6 oz) 71.7 kg (158 lb) 73.1 kg (161 lb 3.2 oz)    ROS: Review of Systems  Constitutional: Negative for chills and fever.  Eyes: Negative for blurred vision.  Respiratory: Negative for cough and shortness of breath.   Cardiovascular: Negative for chest pain.  Gastrointestinal: Positive for abdominal pain. Negative for constipation, diarrhea, nausea and vomiting.  Genitourinary: Negative for dysuria.  Musculoskeletal: Negative for joint pain.  Neurological: Negative for dizziness and headaches.   Exam: Physical Exam  Constitutional: She is oriented to person, place, and time.  HENT:  Nose: No mucosal edema.  Mouth/Throat: No oropharyngeal exudate or posterior oropharyngeal edema.  Eyes: Conjunctivae, EOM and lids are normal. Pupils are equal, round, and reactive to light.  Neck: No JVD present. Carotid bruit is not present. No edema present. No thyroid mass and no thyromegaly present.  Cardiovascular: S1 normal and S2 normal.  Exam reveals no gallop.   No murmur heard. Pulses:      Dorsalis pedis pulses are 2+ on the right side, and 2+ on the left side.  Respiratory: No respiratory distress. She has no wheezes. She has no rhonchi. She has no rales.  GI: Soft. Bowel sounds are normal. There is tenderness in the right upper quadrant.  Musculoskeletal:       Right  ankle: She exhibits swelling.       Left ankle: She exhibits swelling.  Lymphadenopathy:    She has no cervical adenopathy.  Neurological: She is alert and oriented to person, place, and time. No cranial nerve deficit.  Skin: Skin is warm. No rash noted. Nails show no clubbing.  Psychiatric: She has a normal mood and affect.      Data Reviewed: Basic Metabolic Panel:  Recent Labs Lab 02/08/17 2159 02/12/17 0945  NA 128* 128*  K 2.9* 4.8  CL 90* 94*  CO2 25 26  GLUCOSE 142* 204*  BUN 28* 21*  CREATININE 0.73 0.53  CALCIUM 8.8* 8.9   Liver Function Tests:  Recent Labs Lab 02/08/17 2159 02/12/17 0945  AST 156* 169*  ALT 124* 89*  ALKPHOS 1,171* 995*  BILITOT 12.0* 14.4*  PROT 7.3 6.8  ALBUMIN 2.8* 2.2*    Recent Labs Lab 02/08/17 2159 02/12/17 0945  LIPASE 16 137*    Recent Labs Lab 02/08/17 2341  AMMONIA 54*   CBC:  Recent Labs Lab 02/08/17 2159 02/12/17 1239  WBC 8.3 8.2  HGB 13.2 13.4  HCT 39.1 40.5  MCV 86.2 89.9  PLT 187 155   Cardiac Enzymes:  Recent Labs Lab 02/08/17 2159  TROPONINI 0.06*    CBG:  Recent Labs Lab 02/11/17 1158 02/11/17 1702 02/11/17 2026 02/12/17 0730 02/12/17 1154  GLUCAP 103* 163* 112* 141* 183*     Scheduled Meds: . docusate sodium  100 mg Oral BID  .  feeding supplement (ENSURE ENLIVE)  237 mL Oral BID BM  . lisinopril  20 mg Oral Daily   And  . hydrochlorothiazide  25 mg Oral Daily  . insulin aspart  0-5 Units Subcutaneous QHS  . insulin aspart  0-9 Units Subcutaneous TID WC  . linagliptin  5 mg Oral Daily  . phenytoin  300 mg Oral Daily  . polyethylene glycol powder  1 Container Oral Once  . potassium chloride  20 mEq Oral Daily   Continuous Infusions: . sodium chloride      Assessment/Plan:  1. Hyperbilirubinemia. This is due to liver metastases. ERCP and stent done yesterday. Bilirubin today higher than it was a few days ago. Unclear if this is worse than previous or whether it was  going up prior to the procedure. I would like to check liver function tests one more day.  2. stage IV metastatic breast cancer. Overall prognosis is poor. Patient wants to remain a full code.  3. history of DVT. Case discussed with pharmacist that patient is no longer a candidate for Eliquis. I tried to convince the patient to do Lovenox injections but she is unsure if she will be able to. I ordered for an INR check to see if the patient is a candidate for Coumadin. 4. Hyponatremia. Get rid of height or thiazide 5. Hypokalemia replace potassium orally 6. History of seizures on Dilantin 7. Type 2 diabetes mellitus on linagliptin 8. History of scar, cell lung cancer 9. ERCP induced pancreatitis. Full liquid diet. IV fluids.. Pain medication   Code Status:     Code Status Orders        Start     Ordered   02/09/17 0423  Full code  Continuous     02/09/17 0422    Code Status History    Date Active Date Inactive Code Status Order ID Comments User Context   03/02/2015  3:42 PM 03/05/2015  5:21 PM Full Code [id number]  Idelle Crouch, MD Inpatient   02/27/2015  4:48 PM 02/28/2015 11:06 AM Full Code [id number]  Theodoro Grist, MD Inpatient     Family Communication: Refused for me to call family  Disposition Plan: potentially home soon  Consultants:  Gastroenterology  Procedures:  ERCP   Time spent: 45 minutes including ACP time  Loletha Grayer  [employer]

## 2017-02-12 NOTE — Progress Notes (Signed)
Patient has required pain medication x2 for abdominal pain with noted relief. Continued poor appetite- diet changed to full liquid. Updated she will start on Lovenox instead of restarting Eliquis. Overall poor prognosis- patient is refusing to have visitors at this time and requested note on door.

## 2017-02-12 NOTE — Progress Notes (Signed)
Patient ID: Dawn Allen, female   DOB: [birth year], 66 y.o.   MRN: [id number]  ACP note.  Patient at the bedside. Refused for me to call any family.  Patient has metastatic breast cancer and obstructing jaundice secondary to liver metastases. Any chemotherapy that oncology can offer would be palliative in nature only. Overall prognosis is poor. Patient would still like to remain a full code at this point.  Time spent on ACP discussion 17 minutes  Dr. Loletha Grayer

## 2017-02-13 ENCOUNTER — Inpatient Hospital Stay (HOSPITAL_COMMUNITY)
Admission: EM | Admit: 2017-02-13 | Discharge: 2017-02-18 | Disposition: A | Discharge status: Home / Self Care | Payer: Medicare Other | Source: Home / Self Care | Attending: Internal Medicine | Admitting: Internal Medicine

## 2017-02-13 ENCOUNTER — Other Ambulatory Visit: Payer: Self-pay

## 2017-02-13 ENCOUNTER — Encounter: Payer: Self-pay | Admitting: Emergency Medicine

## 2017-02-13 DIAGNOSIS — K922 Gastrointestinal hemorrhage, unspecified: Secondary | ICD-10-CM

## 2017-02-13 DIAGNOSIS — E785 Hyperlipidemia, unspecified: Secondary | ICD-10-CM | POA: Diagnosis present

## 2017-02-13 DIAGNOSIS — I959 Hypotension, unspecified: Secondary | ICD-10-CM

## 2017-02-13 DIAGNOSIS — Z515 Encounter for palliative care: Secondary | ICD-10-CM

## 2017-02-13 DIAGNOSIS — R160 Hepatomegaly, not elsewhere classified: Secondary | ICD-10-CM

## 2017-02-13 DIAGNOSIS — G893 Neoplasm related pain (acute) (chronic): Secondary | ICD-10-CM

## 2017-02-13 DIAGNOSIS — R531 Weakness: Secondary | ICD-10-CM

## 2017-02-13 DIAGNOSIS — C787 Secondary malignant neoplasm of liver and intrahepatic bile duct: Secondary | ICD-10-CM | POA: Diagnosis present

## 2017-02-13 DIAGNOSIS — I248 Other forms of acute ischemic heart disease: Secondary | ICD-10-CM

## 2017-02-13 DIAGNOSIS — E1140 Type 2 diabetes mellitus with diabetic neuropathy, unspecified: Secondary | ICD-10-CM | POA: Diagnosis present

## 2017-02-13 DIAGNOSIS — I2489 Other forms of acute ischemic heart disease: Secondary | ICD-10-CM

## 2017-02-13 DIAGNOSIS — E222 Syndrome of inappropriate secretion of antidiuretic hormone: Secondary | ICD-10-CM

## 2017-02-13 DIAGNOSIS — M549 Dorsalgia, unspecified: Secondary | ICD-10-CM | POA: Diagnosis present

## 2017-02-13 DIAGNOSIS — R748 Abnormal levels of other serum enzymes: Secondary | ICD-10-CM

## 2017-02-13 DIAGNOSIS — R0902 Hypoxemia: Secondary | ICD-10-CM

## 2017-02-13 DIAGNOSIS — Z171 Estrogen receptor negative status [ER-]: Secondary | ICD-10-CM

## 2017-02-13 DIAGNOSIS — D62 Acute posthemorrhagic anemia: Secondary | ICD-10-CM

## 2017-02-13 DIAGNOSIS — E1142 Type 2 diabetes mellitus with diabetic polyneuropathy: Secondary | ICD-10-CM

## 2017-02-13 DIAGNOSIS — I2510 Atherosclerotic heart disease of native coronary artery without angina pectoris: Secondary | ICD-10-CM | POA: Diagnosis present

## 2017-02-13 DIAGNOSIS — R740 Nonspecific elevation of levels of transaminase and lactic acid dehydrogenase [LDH]: Secondary | ICD-10-CM

## 2017-02-13 DIAGNOSIS — J910 Malignant pleural effusion: Secondary | ICD-10-CM

## 2017-02-13 DIAGNOSIS — I10 Essential (primary) hypertension: Secondary | ICD-10-CM | POA: Diagnosis present

## 2017-02-13 DIAGNOSIS — R0600 Dyspnea, unspecified: Secondary | ICD-10-CM

## 2017-02-13 DIAGNOSIS — F411 Generalized anxiety disorder: Secondary | ICD-10-CM

## 2017-02-13 DIAGNOSIS — K7200 Acute and subacute hepatic failure without coma: Secondary | ICD-10-CM

## 2017-02-13 DIAGNOSIS — C50811 Malignant neoplasm of overlapping sites of right female breast: Secondary | ICD-10-CM

## 2017-02-13 DIAGNOSIS — F0634 Mood disorder due to known physiological condition with mixed features: Secondary | ICD-10-CM | POA: Diagnosis present

## 2017-02-13 DIAGNOSIS — K7682 Hepatic encephalopathy: Secondary | ICD-10-CM

## 2017-02-13 DIAGNOSIS — C50919 Malignant neoplasm of unspecified site of unspecified female breast: Secondary | ICD-10-CM

## 2017-02-13 DIAGNOSIS — E871 Hypo-osmolality and hyponatremia: Secondary | ICD-10-CM

## 2017-02-13 DIAGNOSIS — Z9889 Other specified postprocedural states: Secondary | ICD-10-CM

## 2017-02-13 DIAGNOSIS — M898X9 Other specified disorders of bone, unspecified site: Secondary | ICD-10-CM

## 2017-02-13 DIAGNOSIS — R7401 Elevation of levels of liver transaminase levels: Secondary | ICD-10-CM

## 2017-02-13 DIAGNOSIS — Z7189 Other specified counseling: Secondary | ICD-10-CM

## 2017-02-13 DIAGNOSIS — E46 Unspecified protein-calorie malnutrition: Secondary | ICD-10-CM

## 2017-02-13 DIAGNOSIS — I499 Cardiac arrhythmia, unspecified: Secondary | ICD-10-CM

## 2017-02-13 DIAGNOSIS — R2681 Unsteadiness on feet: Secondary | ICD-10-CM

## 2017-02-13 DIAGNOSIS — J90 Pleural effusion, not elsewhere classified: Secondary | ICD-10-CM

## 2017-02-13 LAB — COMPREHENSIVE METABOLIC PANEL
ALBUMIN: 2 g/dL — AB (ref 3.5–5.0)
ALK PHOS: 861 U/L — AB (ref 38–126)
ALT: 80 U/L — ABNORMAL HIGH (ref 14–54)
ALT: 79 U/L — ABNORMAL HIGH (ref 14–54)
AST: 169 U/L — AB (ref 15–41)
AST: 169 U/L — ABNORMAL HIGH (ref 15–41)
Albumin: 2 g/dL — ABNORMAL LOW (ref 3.5–5.0)
Alkaline Phosphatase: 828 U/L — ABNORMAL HIGH (ref 38–126)
Anion gap: 7 (ref 5–15)
Anion gap: 11 (ref 5–15)
BILIRUBIN TOTAL: 12.5 mg/dL — AB (ref 0.3–1.2)
BILIRUBIN TOTAL: 12.3 mg/dL — AB (ref 0.3–1.2)
BUN: 31 mg/dL — AB (ref 6–20)
BUN: 41 mg/dL — ABNORMAL HIGH (ref 6–20)
CO2: 25 mmol/L (ref 22–32)
CO2: 25 mmol/L (ref 22–32)
Calcium: 8.3 mg/dL — ABNORMAL LOW (ref 8.9–10.3)
Calcium: 8.5 mg/dL — ABNORMAL LOW (ref 8.9–10.3)
Chloride: 99 mmol/L — ABNORMAL LOW (ref 101–111)
Chloride: 95 mmol/L — ABNORMAL LOW (ref 101–111)
Creatinine, Ser: 0.61 mg/dL (ref 0.44–1.00)
Creatinine, Ser: 0.75 mg/dL (ref 0.44–1.00)
GFR calc Af Amer: >60 mL/min (ref >60)
GFR calc non Af Amer: >60 mL/min (ref >60)
GLUCOSE: 166 mg/dL — AB (ref 65–99)
Glucose, Bld: 168 mg/dL — ABNORMAL HIGH (ref 65–99)
POTASSIUM: 5 mmol/L (ref 3.5–5.1)
Potassium: 3.8 mmol/L (ref 3.5–5.1)
SODIUM: 131 mmol/L — AB (ref 135–145)
Sodium: 131 mmol/L — ABNORMAL LOW (ref 135–145)
TOTAL PROTEIN: 5.8 g/dL — AB (ref 6.5–8.1)
TOTAL PROTEIN: 6.1 g/dL — AB (ref 6.5–8.1)

## 2017-02-13 LAB — TROPONIN I: TROPONIN I: 0.06 ng/mL — AB (ref <0.03)

## 2017-02-13 LAB — CBC
HEMATOCRIT: 32.6 % — AB (ref 35.0–47.0)
Hemoglobin: 10.7 g/dL — ABNORMAL LOW (ref 12.0–16.0)
MCH: 29.5 pg (ref 26.0–34.0)
MCHC: 32.9 g/dL (ref 32.0–36.0)
MCV: 89.5 fL (ref 80.0–100.0)
PLATELETS: 165 10*3/uL (ref 150–440)
RBC: 3.64 MIL/uL — ABNORMAL LOW (ref 3.80–5.20)
RDW: 17.4 % — AB (ref 11.5–14.5)
WBC: 8.8 10*3/uL (ref 3.6–11.0)

## 2017-02-13 LAB — GLUCOSE, CAPILLARY
GLUCOSE-CAPILLARY: 159 mg/dL — AB (ref 65–99)
Glucose-Capillary: 156 mg/dL — ABNORMAL HIGH (ref 65–99)
Glucose-Capillary: 137 mg/dL — ABNORMAL HIGH (ref 65–99)

## 2017-02-13 LAB — LIPASE, BLOOD: Lipase: 14 U/L (ref 11–51)

## 2017-02-13 LAB — PHENYTOIN LEVEL, TOTAL: Phenytoin Lvl: 14.5 ug/mL (ref 10.0–20.0)

## 2017-02-13 MED ORDER — ALBUTEROL SULFATE (2.5 MG/3ML) 0.083% IN NEBU: 2.5000 mg | INHALATION_SOLUTION | Freq: Four times a day (QID) | RESPIRATORY_TRACT | Status: DC | PRN

## 2017-02-13 MED ORDER — ONDANSETRON HCL 4 MG/2ML IJ SOLN
4.0000 mg | Freq: Once | INTRAMUSCULAR | Status: AC
  Administered 2017-02-13: 4 mg via INTRAVENOUS

## 2017-02-13 MED ORDER — ONDANSETRON HCL 4 MG/2ML IJ SOLN
INTRAMUSCULAR | Status: AC
  Filled 2017-02-13: qty 2

## 2017-02-13 MED ORDER — DIAZEPAM 5 MG PO TABS: 5.0000 mg | ORAL_TABLET | Freq: Two times a day (BID) | ORAL | Status: DC | PRN

## 2017-02-13 MED ORDER — LINAGLIPTIN 5 MG PO TABS
5.0000 mg | ORAL_TABLET | Freq: Every day | ORAL | Status: DC
  Administered 2017-02-14 – 2017-02-18 (×4): 5 mg via ORAL
  Filled 2017-02-13 (×4): qty 1

## 2017-02-13 MED ORDER — HYDROCODONE-ACETAMINOPHEN 5-325 MG PO TABS
1.0000 | ORAL_TABLET | Freq: Four times a day (QID) | ORAL | Status: DC | PRN
  Administered 2017-02-14 (×2): 1 via ORAL
  Filled 2017-02-13 (×2): qty 1

## 2017-02-13 MED ORDER — SODIUM CHLORIDE 0.9% FLUSH
3.0000 mL | Freq: Two times a day (BID) | INTRAVENOUS | Status: DC
  Administered 2017-02-13: 3 mL via INTRAVENOUS

## 2017-02-13 MED ORDER — MORPHINE SULFATE (PF) 2 MG/ML IV SOLN: 1.0000 mg | INTRAVENOUS | Status: DC | PRN

## 2017-02-13 MED ORDER — SODIUM CHLORIDE 0.9 % IV SOLN
INTRAVENOUS | Status: DC
  Administered 2017-02-15 – 2017-02-16 (×3): via INTRAVENOUS

## 2017-02-13 MED ORDER — IPRATROPIUM BROMIDE 0.02 % IN SOLN: 0.5000 mg | Freq: Four times a day (QID) | RESPIRATORY_TRACT | Status: DC | PRN

## 2017-02-13 MED ORDER — LACTULOSE 10 GM/15ML PO SOLN: 20.0000 g | Freq: Every day | ORAL | Status: DC | PRN

## 2017-02-13 MED ORDER — ZOLPIDEM TARTRATE 5 MG PO TABS: 5.0000 mg | ORAL_TABLET | Freq: Every evening | ORAL | Status: DC | PRN

## 2017-02-13 MED ORDER — ENSURE ENLIVE PO LIQD: 237.0000 mL | Freq: Two times a day (BID) | ORAL | 0 refills | Status: AC

## 2017-02-13 MED ORDER — DIAZEPAM 5 MG PO TABS: 5.0000 mg | ORAL_TABLET | Freq: Two times a day (BID) | ORAL | 0 refills | Status: DC | PRN

## 2017-02-13 MED ORDER — WARFARIN SODIUM 3 MG PO TABS: 3.0000 mg | ORAL_TABLET | Freq: Every day | ORAL | 0 refills | Status: DC

## 2017-02-13 MED ORDER — HYDROCODONE-ACETAMINOPHEN 5-325 MG PO TABS: 1.0000 | ORAL_TABLET | Freq: Four times a day (QID) | ORAL | 0 refills | Status: DC | PRN

## 2017-02-13 MED ORDER — NITROGLYCERIN 0.4 MG SL SUBL: 0.4000 mg | SUBLINGUAL_TABLET | SUBLINGUAL | Status: DC | PRN

## 2017-02-13 MED ORDER — INSULIN ASPART 100 UNIT/ML ~~LOC~~ SOLN: 0.0000 [IU] | Freq: Every day | SUBCUTANEOUS | Status: DC

## 2017-02-13 MED ORDER — WARFARIN SODIUM 1 MG PO TABS: 4.0000 mg | ORAL_TABLET | Freq: Every day | ORAL | Status: DC

## 2017-02-13 MED ORDER — ONDANSETRON HCL 4 MG PO TABS
4.0000 mg | ORAL_TABLET | Freq: Four times a day (QID) | ORAL | Status: DC | PRN
  Administered 2017-02-17: 14:00:00 4 mg via ORAL
  Filled 2017-02-13 (×2): qty 1

## 2017-02-13 MED ORDER — WARFARIN - PHYSICIAN DOSING INPATIENT: Freq: Every day | Status: DC

## 2017-02-13 MED ORDER — WARFARIN - PHARMACIST DOSING INPATIENT: Freq: Every day | Status: DC

## 2017-02-13 MED ORDER — CANAGLIFLOZIN 100 MG PO TABS
100.0000 mg | ORAL_TABLET | Freq: Every day | ORAL | Status: DC
  Filled 2017-02-13: qty 1

## 2017-02-13 MED ORDER — LISINOPRIL 20 MG PO TABS
20.0000 mg | ORAL_TABLET | Freq: Every day | ORAL | Status: DC
  Administered 2017-02-16 – 2017-02-18 (×3): 20 mg via ORAL
  Filled 2017-02-13 (×4): qty 1

## 2017-02-13 MED ORDER — ONDANSETRON HCL 4 MG/2ML IJ SOLN
4.0000 mg | Freq: Four times a day (QID) | INTRAMUSCULAR | Status: DC | PRN
  Administered 2017-02-14: 4 mg via INTRAVENOUS
  Filled 2017-02-13 (×2): qty 2

## 2017-02-13 MED ORDER — LIDOCAINE-PRILOCAINE 2.5-2.5 % EX CREA
1.0000 "application " | TOPICAL_CREAM | CUTANEOUS | Status: DC | PRN
  Filled 2017-02-13: qty 5

## 2017-02-13 MED ORDER — LISINOPRIL 20 MG PO TABS: 20.0000 mg | ORAL_TABLET | Freq: Every day | ORAL | 0 refills | Status: DC

## 2017-02-13 MED ORDER — SENNOSIDES-DOCUSATE SODIUM 8.6-50 MG PO TABS: 1.0000 | ORAL_TABLET | Freq: Every evening | ORAL | Status: DC | PRN

## 2017-02-13 MED ORDER — INSULIN ASPART 100 UNIT/ML ~~LOC~~ SOLN
0.0000 [IU] | Freq: Three times a day (TID) | SUBCUTANEOUS | Status: DC
  Administered 2017-02-14 – 2017-02-15 (×4): 3 [IU] via SUBCUTANEOUS
  Administered 2017-02-15: 09:00:00 2 [IU] via SUBCUTANEOUS
  Administered 2017-02-16: 5 [IU] via SUBCUTANEOUS
  Administered 2017-02-17: 2 [IU] via SUBCUTANEOUS
  Administered 2017-02-18: 10:00:00 3 [IU] via SUBCUTANEOUS
  Filled 2017-02-13: qty 3
  Filled 2017-02-13 (×2): qty 1
  Filled 2017-02-13 (×2): qty 3
  Filled 2017-02-13: qty 2
  Filled 2017-02-13: qty 3

## 2017-02-13 MED ORDER — LACTULOSE 10 GM/15ML PO SOLN: 20.0000 g | Freq: Every day | ORAL | 0 refills | Status: AC | PRN

## 2017-02-13 MED ORDER — GI COCKTAIL ~~LOC~~
30.0000 mL | Freq: Three times a day (TID) | ORAL | Status: DC | PRN
  Administered 2017-02-13: 30 mL via ORAL
  Filled 2017-02-13 (×2): qty 30

## 2017-02-13 MED ORDER — ENSURE ENLIVE PO LIQD
237.0000 mL | Freq: Two times a day (BID) | ORAL | Status: DC
  Administered 2017-02-14 – 2017-02-16 (×3): 237 mL via ORAL

## 2017-02-13 MED ORDER — SODIUM CHLORIDE 0.9 % IV SOLN
Freq: Once | INTRAVENOUS | Status: AC
  Administered 2017-02-13: 22:00:00 via INTRAVENOUS

## 2017-02-13 MED ORDER — MAGNESIUM CITRATE PO SOLN
1.0000 | Freq: Once | ORAL | Status: DC | PRN
  Filled 2017-02-13: qty 296

## 2017-02-13 MED ORDER — BISACODYL 5 MG PO TBEC: 5.0000 mg | DELAYED_RELEASE_TABLET | Freq: Every day | ORAL | Status: DC | PRN

## 2017-02-13 MED ORDER — MORPHINE SULFATE (PF) 4 MG/ML IV SOLN
4.0000 mg | Freq: Once | INTRAVENOUS | Status: AC
  Administered 2017-02-13: 4 mg via INTRAVENOUS
  Filled 2017-02-13: qty 1

## 2017-02-13 MED ORDER — ALBUTEROL SULFATE (2.5 MG/3ML) 0.083% IN NEBU
2.5000 mg | INHALATION_SOLUTION | Freq: Four times a day (QID) | RESPIRATORY_TRACT | Status: DC | PRN
  Administered 2017-02-18: 2.5 mg via RESPIRATORY_TRACT
  Filled 2017-02-13 (×2): qty 3

## 2017-02-13 MED ORDER — WARFARIN SODIUM 1 MG PO TABS: 3.0000 mg | ORAL_TABLET | Freq: Every day | ORAL | Status: DC

## 2017-02-13 MED ORDER — PHENYTOIN SODIUM EXTENDED 100 MG PO CAPS
300.0000 mg | ORAL_CAPSULE | Freq: Every day | ORAL | Status: DC
  Administered 2017-02-14 – 2017-02-18 (×4): 300 mg via ORAL
  Filled 2017-02-13 (×4): qty 3

## 2017-02-13 NOTE — H&P (Signed)
History and Physical   [facility] [place] @ [facility] Admission History and Physical [employer], D.O.    Patient Name: Dawn Allen MR#: [id number] Date of Birth: 11-Jan-1951 Date of Admission: 02/13/2017  Referring MD/NP/PA: Dr. Kerman Passey Primary Care Physician: Lorelee Market, MD Patient coming from: Home   Chief Complaint:  Chief Complaint  Patient presents with  . Back Pain    HPI: Dawn Allen is a 66 y.o. female with a known history of metastatic breast cancer, anxiety, CAD, diabetes, DVT, hyperlipidemia, hypertension, MI, seizures presents to the emergency department for evaluation of weakness and back pain.  Patient was discharged from the hospital today after a 5 day stay for hyper ammoniemia, jaundice and right upper quadrant pain secondary to liver metastases. Upon arrival home she was found to be too weak and into much pain to care for herself. She is unable to ambulate.   Records from her hospitalization revealed that she had refused physical therapy evaluation for consideration of placement however today she states that she is accepting an understanding of the fact that she needs assistance whether it is subacute rehabilitation or assistance in the home.  At present she complains of chronic and ongoing chest wall and upper back pain. She also complains of nausea, indigestion and feeling like she needs to belch but cannot.  Patient denies fevers/chills,  dizziness, shortness of breath, abdominal pain, dysuria/frequency, changes in mental status.   EMS/ED Course: Patient received Zofran, morphine, normal saline. Medical admission was requested for intractable pain, weakness, gait instability.  Review of Systems:  CONSTITUTIONAL: Positive  fatigue, weakness, weight gain/loss, headache. negative fever/chills  EYES: No blurry or double vision. ENT: No tinnitus, postnasal drip, redness or soreness of the oropharynx. RESPIRATORY: No cough, dyspnea,  wheeze.  No hemoptysis.  CARDIOVASCULAR:   positive chest wall pain, negative palpitations, syncope, orthopnea. No lower extremity edema.  GASTROINTESTINAL: POSITIVE INDIGESTION,  nausea, no  vomiting, abdominal pain, diarrhea, constipation.  No hematemesis, melena or hematochezia. GENITOURINARY: No dysuria, frequency, hematuria. ENDOCRINE: No polyuria or nocturia. No heat or cold intolerance. HEMATOLOGY: No anemia, bruising, bleeding. INTEGUMENTARY: No rashes, ulcers, lesions. MUSCULOSKELETAL: No arthritis, gout, dyspnea. NEUROLOGIC: No numbness, tingling, ataxia, seizure-type activity, weakness. PSYCHIATRIC: No anxiety, depression, insomnia.   Past Medical History:  Diagnosis Date  . Anxiety   . Breast cancer ([place]) 08/2015   right breast, chemo  . Cancer of right female breast ([place])   . Coronary artery disease    a. NSTEMI cath 08/02/14: LM nl, pLAD 50%, mLCx 30%, mRCA 95% s/p PCI/DES, 2nd lesion 40%, EF 55%  . Diabetes mellitus without complication ([place])   . DVT (deep venous thrombosis) (H[facility])    LEFT LEG   . HLD (hyperlipidemia)   . Hypertension   . Lung cancer ([place])   . Lung nodule   . MI (myocardial infarction) ([place])   . Seizure disorder ([place])   . Seizures ([place])   . 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 ABDOMINAL HYSTERECTOMY     PARTIAL   . AXILLARY LYMPH NODE BIOPSY Right 05/26/2016   Procedure: AXILLARY LYMPH NODE BIOPSY;  Surgeon: Hubbard Robinson, MD;  Location: [facility] ORS;  Service: General;  Laterality: Right;  . BREAST BIOPSY Right 09/15/2015   positve  . CARDIAC CATHETERIZATION  08/02/2014  . CORONARY ANGIOPLASTY  08/02/2014   drug eluting stent placement  . IVC FILTER PLACEMENT ([facility] HX)    .  LEG SURGERY Right    BLOOD CLOT  . MASTECTOMY, PARTIAL Right 05/26/2016   Procedure: MASTECTOMY PARTIAL;  Surgeon: Hubbard Robinson, MD;  Location: [facility] ORS;  Service: General;  Laterality: Right;  .  PORT-A-CATH REMOVAL Left 01/18/2017   Procedure: REMOVAL PORT-A-CATH;  Surgeon: Vickie Epley, MD;  Location: [facility] ORS;  Service: General;  Laterality: Left;  . PORTACATH PLACEMENT Left 10/13/2015   Procedure: INSERTION PORT-A-CATH;  Surgeon: Hubbard Robinson, MD;  Location: [facility] ORS;  Service: General;  Laterality: Left;  . PORTACATH PLACEMENT Right 01/18/2017   Procedure: INSERTION PORT-A-CATH;  Surgeon: Vickie Epley, MD;  Location: [facility] ORS;  Service: General;  Laterality: Right;  . SENTINEL NODE BIOPSY Right 05/26/2016   Procedure: SENTINEL NODE BIOPSY;  Surgeon: Hubbard Robinson, MD;  Location: [facility] ORS;  Service: General;  Laterality: Right;     reports that she has been smoking Cigarettes.  She has a 45.00 pack-year smoking history. She has never used smokeless tobacco. She reports that she does not drink alcohol or use drugs.  Allergies  Allergen Reactions  . Penicillins Swelling    rash    Family History  Problem Relation Age of Onset  . Heart attack Mother   . Breast cancer Sister 23       bilaterally breast cancer    Prior to Admission medications   Medication Sig Start Date End Date Taking? Authorizing Provider  diazepam (VALIUM) 5 MG tablet Take 1 tablet (5 mg total) by mouth 2 (two) times daily as needed. Reported on 12/16/2015 02/13/17  Yes Wieting, Richard, MD  empagliflozin (JARDIANCE) 25 MG TABS tablet Take 25 mg by mouth daily.   Yes [provider]  feeding supplement, ENSURE ENLIVE, (ENSURE ENLIVE) LIQD Take 237 mLs by mouth 2 (two) times daily between meals. 02/13/17  Yes Wieting, Richard, MD  HYDROcodone-acetaminophen (NORCO/VICODIN) 5-325 MG tablet Take 1 tablet by mouth every 6 (six) hours as needed for moderate pain. 02/13/17  Yes Wieting, Richard, MD  lactulose (CHRONULAC) 10 GM/15ML solution Take 30 mLs (20 g total) by mouth daily as needed for mild constipation. 02/13/17  Yes Wieting, Richard, MD  lidocaine-prilocaine (EMLA) cream Apply 1  application topically as needed. Apply to port a cath site 1 hour before chemotherapy treatment. 12/14/16  Yes Cammie Sickle, MD  lisinopril (PRINIVIL,ZESTRIL) 20 MG tablet Take 1 tablet (20 mg total) by mouth daily. 02/13/17  Yes Wieting, Richard, MD  nitroGLYCERIN (NITROSTAT) 0.4 MG SL tablet Place 0.4 mg under the tongue as needed. 01/14/17  Yes [provider]  phenytoin (DILANTIN) 100 MG ER capsule Take 300 mg by mouth daily.    Yes [provider]  TRADJENTA 5 MG TABS tablet Take 5 mg by mouth daily. 01/19/17  Yes [provider]  VENTOLIN HFA 108 (90 Base) MCG/ACT inhaler Inhale 1 puff into the lungs every 6 (six) hours as needed. Reported on 01/20/2016 01/09/16  Yes [provider]  warfarin (COUMADIN) 3 MG tablet Take 1 tablet (3 mg total) by mouth daily at 6 PM. 02/13/17  Yes Loletha Grayer, MD    Physical Exam: Vitals:   02/13/17 1811 02/13/17 1812 02/13/17 2030 02/13/17 2100  BP: 103/62  113/73 109/76  Pulse: (!) 104  99   Resp: 18     Temp: 98.4 F (36.9 C)     TempSrc: Oral     SpO2: 90%  98%   Weight:  72.1 kg (159 lb)    Height:  5\' 5"  (1.651 m)      GENERAL: 66 y.o.-year-oChronically ill-appearing black femaleatient, lying in the bed in mild distress.  Pleasant and cooperative.  HEENT: Head atraumatic, normocephalic. Pupils equal, round, reactive to light and accommodation. positive scleral icterus. Extraocular muscles intact. Nares are patent. Oropharynx is clear. Mucus membranes moist. NECK: Supple, full range of motion. No JVD, no bruit heard. No thyroid enlargement, no tenderness, no cervical lymphadenopathy. CHEST: Normal breath sounds bilaterally. No wheezing, rales, rhonchi or crackles. No use of accessory muscles of respiration.  No reproducible chest wall tenderness.  CARDIOVASCULAR: S1, S2 normal. No murmurs, rubs, or gallops. Cap refill <2 seconds. Pulses intact distally.  ABDOMEN: Soft, nondistended, nontender. No  rebound, guarding, rigidity. Normoactive bowel sounds present in all four quadrants. No organomegaly or mass. EXTREMITIES: No pedal edema, cyanosis, or clubbing. No calf tenderness or Homan's sign.  NEUROLOGIC: The patient is alert and oriented x 3. Cranial nerves II through XII are grossly intact with no focal sensorimotor deficit. Muscle strength 5/5 in all extremities. Sensation intact. Gait not checked. PSYCHIATRIC:  Normal affect, mood, thought content. SKIN: Warm, dry, and intact without obvious rash, lesion, or ulcer.    Labs on Admission:  CBC:  Recent Labs Lab 02/08/17 2159 02/12/17 1239 02/13/17 1916  WBC 8.3 8.2 8.8  HGB 13.2 13.4 10.7*  HCT 39.1 40.5 32.6*  MCV 86.2 89.9 89.5  PLT 187 155 893   Basic Metabolic Panel:  Recent Labs Lab 02/08/17 2159 02/12/17 0945 02/13/17 0452 02/13/17 1916  NA 128* 128* 131* 131*  K 2.9* 4.8 3.8 5.0  CL 90* 94* 99* 95*  CO2 25 26 25 25   GLUCOSE 142* 204* 166* 168*  BUN 28* 21* 31* 41*  CREATININE 0.73 0.53 0.61 0.75  CALCIUM 8.8* 8.9 8.3* 8.5*   GFR: Estimated Creatinine Clearance: 69.7 mL/min (by C-G formula based on SCr of 0.75 mg/dL). Liver Function Tests:  Recent Labs Lab 02/08/17 2159 02/12/17 0945 02/13/17 0452 02/13/17 1916  AST 156* 169* 169* 169*  ALT 124* 89* 80* 79*  ALKPHOS 1,171* 995* 861* 828*  BILITOT 12.0* 14.4* 12.5* 12.3*  PROT 7.3 6.8 5.8* 6.1*  ALBUMIN 2.8* 2.2* 2.0* 2.0*    Recent Labs Lab 02/08/17 2159 02/12/17 0945 02/13/17 0452  LIPASE 16 137* 14    Recent Labs Lab 02/08/17 2341  AMMONIA 54*   Coagulation Profile:  Recent Labs Lab 02/12/17 1239  INR 1.14   Cardiac Enzymes:  Recent Labs Lab 02/08/17 2159 02/13/17 1916  TROPONINI 0.06* 0.06*   BNP (last 3 results) No results for input(s): PROBNP in the last 8760 hours. HbA1C: No results for input(s): HGBA1C in the last 72 hours. CBG:  Recent Labs Lab 02/12/17 1154 02/12/17 1647 02/12/17 2149 02/13/17 0731  02/13/17 1142  GLUCAP 183* 122* 177* 156* 137*   Lipid Profile: No results for input(s): CHOL, HDL, LDLCALC, TRIG, CHOLHDL, LDLDIRECT in the last 72 hours. Thyroid Function Tests: No results for input(s): TSH, T4TOTAL, FREET4, T3FREE, THYROIDAB in the last 72 hours. Anemia Panel: No results for input(s): VITAMINB12, FOLATE, FERRITIN, TIBC, IRON, RETICCTPCT in the last 72 hours. Urine analysis:    Component Value Date/Time   COLORURINE AMBER (A) 02/09/2017 0046   APPEARANCEUR CLEAR 02/09/2017 0046   APPEARANCEUR Hazy 11/24/2011 1319   LABSPEC 1.010 02/09/2017 0046   LABSPEC 1.018 11/24/2011 1319   PHURINE 6.5 02/09/2017 0046   GLUCOSEU 100 (A) 02/09/2017 0046   GLUCOSEU Negative 11/24/2011 1319   HGBUR NEGATIVE 02/09/2017  [place] (A) 02/09/2017 0046   BILIRUBINUR Negative 11/24/2011 1319   KETONESUR NEGATIVE 02/09/2017 0046   PROTEINUR NEGATIVE 02/09/2017 0046   NITRITE NEGATIVE 02/09/2017 0046   LEUKOCYTESUR NEGATIVE 02/09/2017 0046   LEUKOCYTESUR Negative 11/24/2011 1319   Sepsis Labs: @LABRCNTIP (procalcitonin:4,lacticidven:4) )No results found for this or any previous visit (from the past 240 hour(s)).   Radiological Exams on Admission: No results found.  EKG: Sinus tachycardia at 104  bpm with normal axis and nonspecific ST-T wave changes.   Assessment/Plan  This is a 66 y.o. female with a history of metastatic breast cancer, anxiety, CAD, diabetes, DVT, hyperlipidemia, hypertension, MI, seizures now being admitted with:  #. Intractable back pain, generalized weakness and gait instability - Admit inpatient -Pain control -Physical therapy consultation for consideration of placement and/or assistance in the home upon discharge.  - Social work consult  #. Hyponatremia, stable  -gentle IV fluid hydration and recheck BMP in a.m.  #. Chest pain, elevated troponin - Monitor on telemetry - Continue nitro,  - Morphine, pain control  #.  Metastatic breast cancer - Patient is agreeable to palliative consult  #. Transaminitis, chronic and stable  -Monitor CMP  #. History of Hyperammonemia - Continue Lactulose - Check ammonia level in AM  #. History of Hypertension  - Continue Lisinopril  #. History of DVT - Continue Coumadin per pharmacy  #. History of DM - Continue Tradjenta, [place] with RISS coverage - Heart healthy, carb controlled diet  #. History of Seizure - Continue Dilantin  Admission status: Inpatient Fluids: Normal saline Diet: HH, CC Consults: PT and Education officer, museum, Palliative CODE STATUS: Full code DVT Px: Coumadin, SCDs Disposition plan: To be determined in 1-2 days  All the records are reviewed and case discussed with ED provider. Management plans discussed with the patient and/or family who express understanding and agree with plan of care.  Markel Kurtenbach D.O. on 02/13/2017 at 10:16 PM Between 7am to 6pm - Pager - [phone] After 6pm go to [email or web address] - [employer] [facility] [phone] CC: Primary care physician; Lorelee Market, MD   02/13/2017, 10:16 PM

## 2017-02-13 NOTE — Progress Notes (Signed)
Family member here to pick pt up to take her home. Reviewed that pt has prescriptions that need to be filled particularly the coumadin. Pt reported feeling nauseated, but refused nausea med. Educated pt  to take nausea/pain meds to control symptoms. Encouraged pt communicate with family to increase support. Transported in transport chair to private vehicle to self/family care.

## 2017-02-13 NOTE — ED Notes (Signed)
During this RN's assessment Pt states her pain is mostly in her shoulders and rates it at an 8. Pt states once she arrived home today after being discharged from the hospital she had to call her brothers to come over and help her to get into the bed. Pt states she is too weak to stand and can not walk.

## 2017-02-13 NOTE — Discharge Summary (Signed)
[facility] [place] NAME: Dawn Allen    MR#:  [id number]  DATE OF BIRTH:  [birth year]  DATE OF ADMISSION:  02/08/2017 ADMITTING PHYSICIAN: Harrie Foreman, MD  DATE OF DISCHARGE: 02/13/2017  PRIMARY CARE PHYSICIAN: Lorelee Market, MD    ADMISSION DIAGNOSIS:  Hyperammonemia ([place]) [E72.20] Jaundice [R17] Liver metastases ([place]) [C78.7] Right upper quadrant pain [R10.11]   HOSPITAL COURSE:   1.  Jaundice with hyperbilirubinemia. This is due to liver metastases. ERCP and stent done 2 days ago. Bilirubin peaked at 14.5. On the day of discharge it was down to 12.5. The stent will need to be removed in 3 months. Alkaline phosphatase improved from 1171 down to 861. AST remained elevated at 169. ALT did trend better from 124 down to 80. Continue to monitor liver function test as outpatient. I advised her that her urine will remain dark while her bilirubin is high. 2. 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 Overall prognosis remains poor. Poor insight into disease. Patient wants to remain a full code. Patient refused for me to call any family. 3. History of DVT. Case discussed with pharmacist that the patient is no longer a candidate for Eliquis treatment secondary to being on Dilantin and also liver involvement. The patient refused to do Lovenox injections at home. She did not want to switch the Dilantin to another agent. I prescribed Coumadin upon going home. The patient states she follows up at the cancer center every week. She can have her INR checked there. 4. Hyponatremia. This has improved off Hydrochlorothiazide. Hydrocort thiazide was discontinued 5. Hypokalemia. Potassium replaced orally. This should improve with Hydrochlorothiazide being discontinued. 6. History of seizures on Dilantin 7. Type 2 diabetes mellitus on Tradjenta and Jardiance 8. History of squamous cell lung cancer 9. ERCP induced pancreatitis  resolved quickly with IV fluids 10. Pain medication prescribed. 11. Elevated ammonia level on presentation likely with obstructive jaundice. Lactulose prescribed 12. Elevated troponin secondary to demand ischemia  DISCHARGE CONDITIONS:   Guarded  CONSULTS OBTAINED:  Treatment Team:  [name], MD  DRUG ALLERGIES:   Allergies  Allergen Reactions  . Penicillins Swelling    rash    DISCHARGE MEDICATIONS:   Current Discharge Medication List    START taking these medications   Details  feeding supplement, ENSURE ENLIVE, (ENSURE ENLIVE) LIQD Take 237 mLs by mouth 2 (two) times daily between meals. Qty: 60 Bottle, Refills: 0    lisinopril (PRINIVIL,ZESTRIL) 20 MG tablet Take 1 tablet (20 mg total) by mouth daily. Qty: 30 tablet, Refills: 0    warfarin (COUMADIN) 3 MG tablet Take 1 tablet (3 mg total) by mouth daily at 6 PM. Qty: 30 tablet, Refills: 0      CONTINUE these medications which have CHANGED   Details  diazepam (VALIUM) 5 MG tablet Take 1 tablet (5 mg total) by mouth 2 (two) times daily as needed. Reported on 12/16/2015 Qty: 8 tablet, Refills: 0   Associated Diagnoses: Anxiety state    HYDROcodone-acetaminophen (NORCO/VICODIN) 5-325 MG tablet Take 1 tablet by mouth every 6 (six) hours as needed for moderate pain. Qty: 40 tablet, Refills: 0   Associated Diagnoses: Carcinoma of overlapping sites of right breast in female, estrogen receptor negative ([place]); Liver mass; Bone pain; Hyperbilirubinemia; Alkaline phosphatase elevation; Cancer related pain    lactulose (CHRONULAC) 10 GM/15ML solution Take 30 mLs (20 g total) by mouth daily as needed for mild constipation. Qty: 824  mL, Refills: 0   Associated Diagnoses: Carcinoma of overlapping sites of right breast in female, estrogen receptor negative ([place]); Liver mass; Bone pain; Hyperbilirubinemia; Alkaline phosphatase elevation; Cancer related pain      CONTINUE these medications which have NOT CHANGED   Details   empagliflozin (JARDIANCE) 25 MG TABS tablet Take 25 mg by mouth daily.    lidocaine-prilocaine (EMLA) cream Apply 1 application topically as needed. Apply to port a cath site 1 hour before chemotherapy treatment. Qty: 30 g, Refills: 6   Associated Diagnoses: Portacath in place; Carcinoma of overlapping sites of right breast in female, estrogen receptor negative ([facility])    nitroGLYCERIN (NITROSTAT) 0.4 MG SL tablet Place 0.4 mg under the tongue as needed.   Associated Diagnoses: Carcinoma of overlapping sites of right breast in female, estrogen receptor negative ([place]); Liver mass; Bone pain; Hyperbilirubinemia; Alkaline phosphatase elevation; Cancer related pain    phenytoin (DILANTIN) 100 MG ER capsule Take 300 mg by mouth daily.     TRADJENTA 5 MG TABS tablet Take 5 mg by mouth daily.   Associated Diagnoses: Carcinoma of overlapping sites of right breast in female, estrogen receptor negative ([place]); Liver mass; Bone pain; Hyperbilirubinemia; Alkaline phosphatase elevation; Cancer related pain    VENTOLIN HFA 108 (90 Base) MCG/ACT inhaler Inhale 1 puff into the lungs every 6 (six) hours as needed. Reported on 01/20/2016   Associated Diagnoses: Malignant neoplasm of right female breast, unspecified site of breast; Chemotherapy induced nausea and vomiting      STOP taking these medications     apixaban (ELIQUIS) 2.5 MG TABS tablet      Aspirin-Salicylamide-Caffeine (BC HEADACHE POWDER PO)      ibuprofen (ADVIL,MOTRIN) 600 MG tablet      lisinopril-hydrochlorothiazide (PRINZIDE,ZESTORETIC) 20-25 MG per tablet      pioglitazone (ACTOS) 45 MG tablet      potassium chloride SA (K-DUR,KLOR-CON) 20 MEQ tablet      atorvastatin (LIPITOR) 40 MG tablet      ondansetron (ZOFRAN ODT) 4 MG disintegrating tablet      polyethylene glycol powder (MIRALAX) powder          DISCHARGE INSTRUCTIONS:   Follow up at the cancer center this week Follow-up PMD 2 weeks  If you experience worsening  of your admission symptoms, develop shortness of breath, life threatening emergency, suicidal or homicidal thoughts you must seek medical attention immediately by calling 911 or calling your MD immediately  if symptoms less severe.  You Must read complete instructions/literature along with all the possible adverse reactions/side effects for all the Medicines you take and that have been prescribed to you. Take any new Medicines after you have completely understood and accept all the possible adverse reactions/side effects.   Please note  You were cared for by a hospitalist during your hospital stay. If you have any questions about your discharge medications or the care you received while you were in the hospital after you are discharged, you can call the unit and asked to speak with the hospitalist on call if the hospitalist that took care of you is not available. Once you are discharged, your primary care physician will handle any further medical issues. Please note that NO REFILLS for any discharge medications will be authorized once you are discharged, as it is imperative that you return to your primary care physician (or establish a relationship with a primary care physician if you do not have one) for your aftercare needs so that they can reassess your  need for medications and monitor your lab values.    Today   CHIEF COMPLAINT:   Chief Complaint  Patient presents with  . Abdominal Pain  . Back Pain    HISTORY OF PRESENT ILLNESS:  Dawn Allen  is a 66 y.o. female with a known history of Metastatic breast cancer presented with abdominal pain and jaundice   VITAL SIGNS:  Blood pressure 126/88, pulse 92, temperature 98.2 F (36.8 C), temperature source Oral, resp. rate 20, height 5\' 5"  (1.651 m), weight 72.5 kg (159 lb 14.4 oz), SpO2 100 %.   PHYSICAL EXAMINATION:  GENERAL:  66 y.o.-year-old patient lying in the bed with no acute distress.  EYES: Pupils equal, round, reactive to  light and accommodation.  Patient with icterus. Extraocular muscles intact.  HEENT: Head atraumatic, normocephalic. Oropharynx and nasopharynx clear.  NECK:  Supple, no jugular venous distention. No thyroid enlargement, no tenderness.  LUNGS: Normal breath sounds bilaterally, no wheezing, rales,rhonchi or crepitation. No use of accessory muscles of respiration.  CARDIOVASCULAR: S1, S2 normal. No murmurs, rubs, or gallops.  ABDOMEN: Soft, non-tender, non-distended. Bowel sounds present. No organomegaly or mass.  EXTREMITIES: No pedal edema, cyanosis, or clubbing.  NEUROLOGIC: Cranial nerves II through XII are intact. Muscle strength 5/5 in all extremities. Sensation intact. Gait not checked.  PSYCHIATRIC: The patient is alert and oriented x 3.  SKIN: Jaundice  DATA REVIEW:   CBC  Recent Labs Lab 02/12/17 1239  WBC 8.2  HGB 13.4  HCT 40.5  PLT 155    Chemistries   Recent Labs Lab 02/13/17 0452  NA 131*  K 3.8  CL 99*  CO2 25  GLUCOSE 166*  BUN 31*  CREATININE 0.61  CALCIUM 8.3*  AST 169*  ALT 80*  ALKPHOS 861*  BILITOT 12.5*    Cardiac Enzymes  Recent Labs Lab 02/08/17 2159  TROPONINI 0.06*    Microbiology Results  Results for orders placed or performed during the hospital encounter of 10/08/15  Surgical pcr screen     Status: None   Collection Time: 10/08/15  3:37 PM  Result Value Ref Range Status   MRSA, PCR NEGATIVE NEGATIVE Final   Staphylococcus aureus NEGATIVE NEGATIVE Final    Comment:        The Xpert SA Assay (FDA approved for NASAL specimens in patients over 55 years of age), is one component of a comprehensive surveillance program.  Test performance has been validated by [facility] for patients greater than or equal to 56 year old. It is not intended to diagnose infection nor to guide or monitor treatment.     RADIOLOGY:  Dg C-arm 1-60 Min-no Report  Result Date: 02/11/2017 Fluoroscopy was utilized by the requesting physician.  No  radiographic interpretation.       Management plans discussed with the patient,And she is in agreement.  CODE STATUS:     Code Status Orders        Start     Ordered   02/09/17 0423  Full code  Continuous     02/09/17 0422    Code Status History    Date Active Date Inactive Code Status Order ID Comments User Context   03/02/2015  3:42 PM 03/05/2015  5:21 PM Full Code [id number]  Idelle Crouch, MD Inpatient   02/27/2015  4:48 PM 02/28/2015 11:06 AM Full Code [id number]  Theodoro Grist, MD Inpatient      TOTAL TIME TAKING CARE OF THIS PATIENT: 35 minutes.    Leslye Peer,  Prestin Munch M.D on 02/13/2017 at 1:44 PM  Between 7am to 6pm - Pager - [phone]  After 6pm go to [email or web address] - password [email or web address] [facility]  [phone]  CC: Primary care physician; Lorelee Market, MD

## 2017-02-13 NOTE — Progress Notes (Signed)
Reports tolerated diet well; reports mild nausea-refuses nausea med offers. Dilaudid x 1 effective for pain control. Pt requested refill rx for valium which was obtained. Oral and written AVS instructions done with focus on medication changes. Written rx's given to pt. Currently resting quietly with eyes closed.

## 2017-02-13 NOTE — ED Provider Notes (Signed)
[facility] Emergency Department Provider Note  Time seen: 7:50 PM  I have reviewed the triage vital signs and the nursing notes.   HISTORY  Chief Complaint Back Pain    HPI Dawn Allen is a 66 y.o. female with a past medical history of anxiety, CAD, diabetes, DVT, hyperlipidemia, hypertension, MI, seizures, presents to the emergency department for generalize weakness and continued pain.Patient has a history of metastatic breast cancer. Patient was admitted to the hospital 6/19 and discharged today 6/24. Patient's admission was for hyperbilirubinemia, jaundice, stage IV breast cancer. Patient states continued generalized weakness. Patient lives alone and is not able to ambulate by herself. Says she is feeling extremely weak and continues to have chest pain. Patient states she told her physician that she could not go home due to extreme weakness but was discharged anyways per patient. Family is here with the patient he states that she cannot care for herself at home due to extreme fatigue/weakness and inability to ambulate on her own. Patient states chest pain but states this is largely unchanged from her chronic discomfort she is experiencing.  Past Medical History:  Diagnosis Date  . Anxiety   . Breast cancer ([place]) 08/2015   right breast, chemo  . Cancer of right female breast ([place])   . Coronary artery disease    a. NSTEMI cath 08/02/14: LM nl, pLAD 50%, mLCx 30%, mRCA 95% s/p PCI/DES, 2nd lesion 40%, EF 55%  . Diabetes mellitus without complication ([place])   . DVT (deep venous thrombosis) (HCC)    LEFT LEG   . HLD (hyperlipidemia)   . Hypertension   . Lung cancer ([place])   . Lung nodule   . MI (myocardial infarction) ([place])   . Seizure disorder ([place])   . Seizures ([place])   . 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 Jaundice   . Liver metastases ([place])   . Mechanical complication of venous catheter  ([place])   . Bone pain 02/03/2017  . Hyperbilirubinemia 02/03/2017  . Alkaline phosphatase elevation 02/03/2017  . Cancer related pain 02/03/2017  . Encounter for antineoplastic chemotherapy 01/25/2017  . Malignant neoplasm of right female breast ([place])   . Counseling regarding goals of care 12/21/2016  . Liver mass 10/21/2016  . Carcinoma of overlapping sites of right breast in female, estrogen receptor negative ([place]) 02/17/2016  . Mass of right breast 08/14/2015  . Thyroid nodule 04/29/2015  . Squamous cell lung cancer ([place]) 04/23/2015  . Hypokalemia 03/02/2015  . Leukocytosis 03/02/2015  . Generalized weakness 03/02/2015  . Lung nodule 03/02/2015  . Pain in a tooth or teeth 03/02/2015  . Sepsis ([place]) 03/02/2015  . Lung mass 03/02/2015  . SIRS (systemic inflammatory response syndrome) ([place]) 02/27/2015  . Coronary artery disease   . Hypertension   . DVT (deep venous thrombosis) ([place])   . HLD (hyperlipidemia)   . Achilles bursitis or tendinitis 05/31/2013  . 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 Other hammer toe (acquired) 05/31/2013  . 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 ABDOMINAL HYSTERECTOMY     PARTIAL   . AXILLARY LYMPH NODE BIOPSY Right 05/26/2016   Procedure: AXILLARY LYMPH NODE BIOPSY;  Surgeon: Hubbard Robinson, MD;  Location: [facility] ORS;  Service: General;  Laterality: Right;  . BREAST BIOPSY Right 09/15/2015   positve  . CARDIAC CATHETERIZATION  08/02/2014  .  CORONARY ANGIOPLASTY  08/02/2014   drug eluting stent placement  . IVC FILTER PLACEMENT ([facility] HX)    . LEG SURGERY Right    BLOOD CLOT  . MASTECTOMY, PARTIAL Right 05/26/2016   Procedure: MASTECTOMY PARTIAL;  Surgeon: Hubbard Robinson, MD;  Location: [facility] ORS;  Service: General;  Laterality: Right;  . PORT-A-CATH REMOVAL Left 01/18/2017   Procedure: REMOVAL PORT-A-CATH;  Surgeon: Vickie Epley, MD;  Location: [facility] ORS;   Service: General;  Laterality: Left;  . PORTACATH PLACEMENT Left 10/13/2015   Procedure: INSERTION PORT-A-CATH;  Surgeon: Hubbard Robinson, MD;  Location: [facility] ORS;  Service: General;  Laterality: Left;  . PORTACATH PLACEMENT Right 01/18/2017   Procedure: INSERTION PORT-A-CATH;  Surgeon: Vickie Epley, MD;  Location: [facility] ORS;  Service: General;  Laterality: Right;  . SENTINEL NODE BIOPSY Right 05/26/2016   Procedure: SENTINEL NODE BIOPSY;  Surgeon: Hubbard Robinson, MD;  Location: [facility] ORS;  Service: General;  Laterality: Right;    Prior to Admission medications   Medication Sig Start Date End Date Taking? Authorizing Provider  diazepam (VALIUM) 5 MG tablet Take 1 tablet (5 mg total) by mouth 2 (two) times daily as needed. Reported on 12/16/2015 02/13/17   Loletha Grayer, MD  empagliflozin (JARDIANCE) 25 MG TABS tablet Take 25 mg by mouth daily.    [provider]  feeding supplement, ENSURE ENLIVE, (ENSURE ENLIVE) LIQD Take 237 mLs by mouth 2 (two) times daily between meals. 02/13/17   Loletha Grayer, MD  HYDROcodone-acetaminophen (NORCO/VICODIN) 5-325 MG tablet Take 1 tablet by mouth every 6 (six) hours as needed for moderate pain. 02/13/17   Loletha Grayer, MD  lactulose (CHRONULAC) 10 GM/15ML solution Take 30 mLs (20 g total) by mouth daily as needed for mild constipation. 02/13/17   Loletha Grayer, MD  lidocaine-prilocaine (EMLA) cream Apply 1 application topically as needed. Apply to port a cath site 1 hour before chemotherapy treatment. 12/14/16   Cammie Sickle, MD  lisinopril (PRINIVIL,ZESTRIL) 20 MG tablet Take 1 tablet (20 mg total) by mouth daily. 02/13/17   Loletha Grayer, MD  nitroGLYCERIN (NITROSTAT) 0.4 MG SL tablet Place 0.4 mg under the tongue as needed. 01/14/17   [provider]  phenytoin (DILANTIN) 100 MG ER capsule Take 300 mg by mouth daily.     [provider]  TRADJENTA 5 MG TABS tablet Take 5 mg by mouth daily. 01/19/17    [provider]  VENTOLIN HFA 108 (90 Base) MCG/ACT inhaler Inhale 1 puff into the lungs every 6 (six) hours as needed. Reported on 01/20/2016 01/09/16   [provider]  warfarin (COUMADIN) 3 MG tablet Take 1 tablet (3 mg total) by mouth daily at 6 PM. 02/13/17   Loletha Grayer, MD    Allergies  Allergen Reactions  . Penicillins Swelling    rash    Family History  Problem Relation Age of Onset  . Heart attack Mother   . Breast cancer Sister 30       bilaterally breast cancer    Social History Social History  Substance Use Topics  . Smoking status: Current Every Day Smoker    Packs/day: 1.00    Years: 45.00    Types: Cigarettes  . Smokeless tobacco: Never Used  . Alcohol use No    Review of Systems Constitutional: Negative for fever. Cardiovascular: Positive for chest pain Respiratory: Negative for shortness of breath. Gastrointestinal: Negative for abdominal pain, vomiting and diarrhea. Genitourinary: Negative for dysuria. Musculoskeletal: States some  upper back pain which has been ongoing for several weeks. Skin: Negative for rash. Neurological: Negative for headache All other ROS negative  ____________________________________________   PHYSICAL EXAM:  VITAL SIGNS: ED Triage Vitals  Enc Vitals Group     BP 02/13/17 1811 103/62     Pulse Rate 02/13/17 1811 (!) 104     Resp 02/13/17 1811 18     Temp 02/13/17 1811 98.4 F (36.9 C)     Temp Source 02/13/17 1811 Oral     SpO2 02/13/17 1808 90 %     Weight 02/13/17 1812 159 lb (72.1 kg)     Height 02/13/17 1812 5\' 5"  (1.651 m)     Head Circumference --      Peak Flow --      Pain Score 02/13/17 1811 10     Pain Loc --      Pain Edu? --      Excl. in [address]? --     Constitutional: Alert and oriented. Well appearing and in no distress. Eyes: Normal exam ENT   Head: Normocephalic and atraumatic.   Mouth/Throat: Mucous membranes are moist. Cardiovascular: Normal rate, regular rhythm. No  murmur Respiratory: Normal respiratory effort without tachypnea nor retractions. Breath sounds are clear  Gastrointestinal: Soft and nontender. No distention. Musculoskeletal: Nontender with normal range of motion in all extremities. Neurologic:  Normal speech and language. No gross focal neurologic deficits Skin:  Skin is warm, dry and intact.  Psychiatric: Mood and affect are normal. Speech and behavior are normal.   ____________________________________________    EKG  EKG reviewed and interpreted by myself shows sinus tachycardia 104 bpm, narrow QRS, normal axis, normal intervals, no concerning ST changes.   INITIAL IMPRESSION / ASSESSMENT AND PLAN / ED COURSE  Pertinent labs & imaging results that were available during my care of the patient were reviewed by me and considered in my medical decision making (see chart for details).  The patient presents the emergency department for extreme fatigue/weakness. Patient was discharged from the hospital today. Patient is also complaining of chest pain although states this has been an ongoing issue for her. We will check labs and discussed with the hospitalist for possible admission versus social work consultation for placement into a rehabilitation facility.  Patient's labs are largely unchanged from discharge however continue to be quite abnormal including hyponatremia. With the patient's generalized weakness and fatigue and hyponatremia we will replete with normal saline infusion. Hyperbilirubinemic. Patient is unsteady on her feet with generalized weakness. Patient will be admitted for further workup and possibly placement to a rehabilitation facility. ____________________________________________   FINAL CLINICAL IMPRESSION(S) / ED DIAGNOSES  Generalized weakness Chest pain Hyponatremia Gait instability    Harvest Dark, MD 02/13/17 2120

## 2017-02-13 NOTE — ED Notes (Signed)
MD Paduchowski at bedside

## 2017-02-13 NOTE — ED Triage Notes (Signed)
Pt arrived to ED by EMS from home. Pt discharged from floor today. Pt states she continues to have back pain and is unable to walk. Pt also states she is having rt shoulder pain and chest pain. Pt has hx of cancer to lungs, breast and liver.

## 2017-02-13 NOTE — Discharge Instructions (Addendum)
° °  Abdominal Pain, Adult Abdominal pain can be caused by many things. Often, abdominal pain is not serious and it gets better with no treatment or by being treated at home. However, sometimes abdominal pain is serious. Your health care provider will do a medical history and a physical exam to try to determine the cause of your abdominal pain. Follow these instructions at home:  Take over-the-counter and prescription medicines only as told by your health care provider. Do not take a laxative unless told by your health care provider.  Drink enough fluid to keep your urine clear or pale yellow.  Watch your condition for any changes.  Keep all follow-up visits as told by your health care provider. This is important. Contact a health care provider if:  Your abdominal pain changes or gets worse.  You are not hungry or you lose weight without trying.  You are constipated or have diarrhea for more than 2-3 days.  You have pain when you urinate or have a bowel movement.  Your abdominal pain wakes you up at night.  Your pain gets worse with meals, after eating, or with certain foods.  You are throwing up and cannot keep anything down.  You have a fever. Get help right away if:  Your pain does not go away as soon as your health care provider told you to expect.  You cannot stop throwing up.  Your pain is only in areas of the abdomen, such as the right side or the left lower portion of the abdomen.  You have bloody or black stools, or stools that look like tar.  You have severe pain, cramping, or bloating in your abdomen.  You have signs of dehydration, such as: ? Dark urine, very little urine, or no urine. ? Cracked lips. ? Dry mouth. ? Sunken eyes. ? Sleepiness. ? Weakness. This information is not intended to replace advice given to you by your health care provider. Make sure you discuss any questions you have with your health care provider. Document Released: 05/19/2005  Document Revised: 02/27/2016 Document Reviewed: 01/21/2016 Elsevier Interactive Patient Education  2017 [place]. Need to have Coumadin level check every Thursday. Follow up with Dr Allen Norris gastroenterology for stent removal in 3 months Close follow up with oncology  As per pharmacist, eliquis stopped because its contraindicated with dilantin and also does have liver contraindications.  Coumadin started instead.

## 2017-02-13 NOTE — Progress Notes (Signed)
ANTICOAGULATION CONSULT NOTE - Initial Consult  Pharmacy Consult for warfarin Indication: DVT  Allergies  Allergen Reactions  . Penicillins Swelling    rash    Patient Measurements: Height: 5\' 5"  (165.1 cm) Weight: 159 lb (72.1 kg) IBW/kg (Calculated) : 57 Heparin Dosing Weight: 72.1 kg  Vital Signs: Temp: 98.4 F (36.9 C) (06/24 1811) Temp Source: Oral (06/24 1811) BP: 131/109 (06/24 2200) Pulse Rate: 99 (06/24 2030)  Labs:  Recent Labs  02/12/17 0945 02/12/17 1239 02/13/17 0452 02/13/17 1916  HGB  --  13.4  --  10.7*  HCT  --  40.5  --  32.6*  PLT  --  155  --  165  LABPROT  --  14.7  --   --   INR  --  1.14  --   --   CREATININE 0.53  --  0.61 0.75  TROPONINI  --   --   --  0.06*    Estimated Creatinine Clearance: 69.7 mL/min (by C-G formula based on SCr of 0.75 mg/dL).   Medical History: Past Medical History:  Diagnosis Date  . Anxiety   . Breast cancer ([place]) 08/2015   right breast, chemo  . Cancer of right female breast ([place])   . Coronary artery disease    a. NSTEMI cath 08/02/14: LM nl, pLAD 50%, mLCx 30%, mRCA 95% s/p PCI/DES, 2nd lesion 40%, EF 55%  . Diabetes mellitus without complication ([place])   . DVT (deep venous thrombosis) (HCC)    LEFT LEG   . HLD (hyperlipidemia)   . Hypertension   . Lung cancer ([place])   . Lung nodule   . MI (myocardial infarction) ([place])   . Seizure disorder ([place])   . Seizures ([place])   . Squamous cell lung cancer ([place]) 04/23/2015    Medications:  Scheduled:  . [START ON 02/14/2017] canagliflozin  100 mg Oral QAC breakfast  . [START ON 02/14/2017] feeding supplement (ENSURE ENLIVE)  237 mL Oral BID BM  . [START ON 02/14/2017] insulin aspart  0-15 Units Subcutaneous TID WC  . insulin aspart  0-5 Units Subcutaneous QHS  . [START ON 02/14/2017] linagliptin  5 mg Oral Daily  . [START ON 02/14/2017] lisinopril  20 mg Oral Daily  . [START ON 02/14/2017] phenytoin  300 mg Oral Daily  . [START ON 02/14/2017] warfarin  3 mg Oral  q1800  . [START ON 02/14/2017] Warfarin - Pharmacist Dosing Inpatient   Does not apply q1800    Assessment: Patient admitted for back pain and has a h/o metastatic breast CA, jaudice s/t liver metastasis. Patient was previously on apixaban for DVT treatment but failed therapy, was then offered lovenox for DVT treatment, but patient was not amenable to this therapy.  Patient was then started on warfarin 3mg  daily on prior admit. INR 6/23 1.14 (on prior admit)  Goal of Therapy:  INR 2-3 Monitor platelets by anticoagulation protocol: Yes   Plan:  Will get INR w/ am labs and then daily. Will restart warfarin 3 mg daily and will closely monitor INR in the setting of liver metastasis and phenytoin therapy, which can induce warfarin metabolism.  Tobie Lords, PharmD, BCPS Clinical Pharmacist 02/13/2017

## 2017-02-14 ENCOUNTER — Inpatient Hospital Stay: Payer: Medicare Other

## 2017-02-14 ENCOUNTER — Encounter: Payer: Self-pay | Admitting: Gastroenterology

## 2017-02-14 ENCOUNTER — Ambulatory Visit: Payer: Medicare Other

## 2017-02-14 ENCOUNTER — Other Ambulatory Visit: Payer: Self-pay | Admitting: Internal Medicine

## 2017-02-14 DIAGNOSIS — E119 Type 2 diabetes mellitus without complications: Secondary | ICD-10-CM

## 2017-02-14 DIAGNOSIS — C787 Secondary malignant neoplasm of liver and intrahepatic bile duct: Principal | ICD-10-CM

## 2017-02-14 DIAGNOSIS — R944 Abnormal results of kidney function studies: Secondary | ICD-10-CM

## 2017-02-14 DIAGNOSIS — Z85118 Personal history of other malignant neoplasm of bronchus and lung: Secondary | ICD-10-CM

## 2017-02-14 DIAGNOSIS — C50919 Malignant neoplasm of unspecified site of unspecified female breast: Secondary | ICD-10-CM

## 2017-02-14 DIAGNOSIS — C50811 Malignant neoplasm of overlapping sites of right female breast: Secondary | ICD-10-CM

## 2017-02-14 DIAGNOSIS — G40909 Epilepsy, unspecified, not intractable, without status epilepticus: Secondary | ICD-10-CM

## 2017-02-14 DIAGNOSIS — Z9221 Personal history of antineoplastic chemotherapy: Secondary | ICD-10-CM

## 2017-02-14 DIAGNOSIS — R110 Nausea: Secondary | ICD-10-CM

## 2017-02-14 DIAGNOSIS — K5900 Constipation, unspecified: Secondary | ICD-10-CM

## 2017-02-14 DIAGNOSIS — Z515 Encounter for palliative care: Secondary | ICD-10-CM

## 2017-02-14 DIAGNOSIS — F419 Anxiety disorder, unspecified: Secondary | ICD-10-CM

## 2017-02-14 DIAGNOSIS — R05 Cough: Secondary | ICD-10-CM

## 2017-02-14 DIAGNOSIS — G893 Neoplasm related pain (acute) (chronic): Secondary | ICD-10-CM

## 2017-02-14 DIAGNOSIS — R0602 Shortness of breath: Secondary | ICD-10-CM

## 2017-02-14 DIAGNOSIS — E785 Hyperlipidemia, unspecified: Secondary | ICD-10-CM

## 2017-02-14 DIAGNOSIS — I10 Essential (primary) hypertension: Secondary | ICD-10-CM

## 2017-02-14 DIAGNOSIS — F17210 Nicotine dependence, cigarettes, uncomplicated: Secondary | ICD-10-CM

## 2017-02-14 DIAGNOSIS — I2510 Atherosclerotic heart disease of native coronary artery without angina pectoris: Secondary | ICD-10-CM

## 2017-02-14 DIAGNOSIS — R630 Anorexia: Secondary | ICD-10-CM

## 2017-02-14 DIAGNOSIS — M549 Dorsalgia, unspecified: Secondary | ICD-10-CM

## 2017-02-14 DIAGNOSIS — Z86718 Personal history of other venous thrombosis and embolism: Secondary | ICD-10-CM

## 2017-02-14 DIAGNOSIS — Z171 Estrogen receptor negative status [ER-]: Secondary | ICD-10-CM

## 2017-02-14 DIAGNOSIS — R634 Abnormal weight loss: Secondary | ICD-10-CM

## 2017-02-14 DIAGNOSIS — I252 Old myocardial infarction: Secondary | ICD-10-CM

## 2017-02-14 DIAGNOSIS — Z79899 Other long term (current) drug therapy: Secondary | ICD-10-CM

## 2017-02-14 LAB — COMPREHENSIVE METABOLIC PANEL
ALT: 72 U/L — AB (ref 14–54)
AST: 155 U/L — ABNORMAL HIGH (ref 15–41)
Albumin: 1.8 g/dL — ABNORMAL LOW (ref 3.5–5.0)
Alkaline Phosphatase: 734 U/L — ABNORMAL HIGH (ref 38–126)
Anion gap: 6 (ref 5–15)
BUN: 49 mg/dL — ABNORMAL HIGH (ref 6–20)
CHLORIDE: 100 mmol/L — AB (ref 101–111)
CO2: 24 mmol/L (ref 22–32)
Calcium: 8.2 mg/dL — ABNORMAL LOW (ref 8.9–10.3)
Creatinine, Ser: 0.7 mg/dL (ref 0.44–1.00)
Glucose, Bld: 146 mg/dL — ABNORMAL HIGH (ref 65–99)
POTASSIUM: 4.5 mmol/L (ref 3.5–5.1)
SODIUM: 130 mmol/L — AB (ref 135–145)
Total Bilirubin: 11 mg/dL — ABNORMAL HIGH (ref 0.3–1.2)
Total Protein: 5.5 g/dL — ABNORMAL LOW (ref 6.5–8.1)

## 2017-02-14 LAB — CBC
HEMATOCRIT: 28.7 % — AB (ref 35.0–47.0)
Hemoglobin: 9.6 g/dL — ABNORMAL LOW (ref 12.0–16.0)
MCH: 30 pg (ref 26.0–34.0)
MCHC: 33.4 g/dL (ref 32.0–36.0)
MCV: 89.8 fL (ref 80.0–100.0)
PLATELETS: 156 10*3/uL (ref 150–440)
RBC: 3.19 MIL/uL — AB (ref 3.80–5.20)
RDW: 17.1 % — AB (ref 11.5–14.5)
WBC: 8.6 10*3/uL (ref 3.6–11.0)

## 2017-02-14 LAB — OSMOLALITY, URINE: OSMOLALITY UR: 530 mosm/kg (ref 300–900)

## 2017-02-14 LAB — TROPONIN I
TROPONIN I: 0.08 ng/mL — AB (ref <0.03)
Troponin I: 0.05 ng/mL (ref <0.03)
Troponin I: 0.06 ng/mL (ref <0.03)

## 2017-02-14 LAB — PHOSPHORUS: PHOSPHORUS: 3.1 mg/dL (ref 2.5–4.6)

## 2017-02-14 LAB — MAGNESIUM: Magnesium: 2 mg/dL (ref 1.7–2.4)

## 2017-02-14 LAB — GLUCOSE, CAPILLARY
GLUCOSE-CAPILLARY: 154 mg/dL — AB (ref 65–99)
GLUCOSE-CAPILLARY: 162 mg/dL — AB (ref 65–99)
Glucose-Capillary: 154 mg/dL — ABNORMAL HIGH (ref 65–99)
Glucose-Capillary: 128 mg/dL — ABNORMAL HIGH (ref 65–99)

## 2017-02-14 LAB — PROTIME-INR
INR: 1.73
PROTHROMBIN TIME: 20.5 s — AB (ref 11.4–15.2)

## 2017-02-14 LAB — HEMOGLOBIN: HEMOGLOBIN: 8.9 g/dL — AB (ref 12.0–16.0)

## 2017-02-14 LAB — AMMONIA: AMMONIA: 74 umol/L — AB (ref 9–35)

## 2017-02-14 MED ORDER — WARFARIN - PHARMACIST DOSING INPATIENT: Freq: Every day | Status: DC

## 2017-02-14 MED ORDER — SODIUM CHLORIDE 0.9 % IV SOLN
Freq: Once | INTRAVENOUS | Status: AC
  Administered 2017-02-14: 13:00:00 via INTRAVENOUS

## 2017-02-14 MED ORDER — DEXAMETHASONE SODIUM PHOSPHATE 4 MG/ML IJ SOLN
4.0000 mg | Freq: Once | INTRAMUSCULAR | Status: AC
  Administered 2017-02-14: 13:00:00 4 mg via INTRAVENOUS
  Filled 2017-02-14: qty 1

## 2017-02-14 MED ORDER — LIDOCAINE 5 % EX PTCH
2.0000 | MEDICATED_PATCH | CUTANEOUS | Status: DC
  Administered 2017-02-14 – 2017-02-17 (×3): 2 via TRANSDERMAL
  Filled 2017-02-14 (×5): qty 2

## 2017-02-14 MED ORDER — FENTANYL CITRATE (PF) 100 MCG/2ML IJ SOLN
12.5000 ug | Freq: Once | INTRAMUSCULAR | Status: AC
  Administered 2017-02-14: 12.5 ug via INTRAVENOUS
  Filled 2017-02-14: qty 2

## 2017-02-14 MED ORDER — IPRATROPIUM BROMIDE 0.02 % IN SOLN: 0.5000 mg | RESPIRATORY_TRACT | Status: DC

## 2017-02-14 MED ORDER — OXYCODONE HCL 5 MG PO TABS
5.0000 mg | ORAL_TABLET | ORAL | Status: DC | PRN
  Administered 2017-02-16 – 2017-02-17 (×2): 5 mg via ORAL
  Filled 2017-02-14 (×2): qty 1

## 2017-02-14 MED ORDER — IPRATROPIUM-ALBUTEROL 0.5-2.5 (3) MG/3ML IN SOLN
3.0000 mL | Freq: Four times a day (QID) | RESPIRATORY_TRACT | Status: DC
  Administered 2017-02-14 – 2017-02-15 (×7): 3 mL via RESPIRATORY_TRACT
  Filled 2017-02-14 (×6): qty 3

## 2017-02-14 MED ORDER — VITAMIN K1 10 MG/ML IJ SOLN
5.0000 mg | Freq: Once | INTRAVENOUS | Status: AC
  Administered 2017-02-14: 18:00:00 5 mg via INTRAVENOUS
  Filled 2017-02-14: qty 0.5

## 2017-02-14 MED ORDER — BUDESONIDE 0.25 MG/2ML IN SUSP
0.2500 mg | Freq: Two times a day (BID) | RESPIRATORY_TRACT | Status: DC
  Administered 2017-02-14 – 2017-02-15 (×4): 0.25 mg via RESPIRATORY_TRACT
  Filled 2017-02-14 (×4): qty 2

## 2017-02-14 MED ORDER — DEXAMETHASONE 0.5 MG PO TABS: 1.0000 mg | ORAL_TABLET | Freq: Two times a day (BID) | ORAL | Status: DC

## 2017-02-14 MED ORDER — ASPIRIN EC 81 MG PO TBEC
81.0000 mg | DELAYED_RELEASE_TABLET | Freq: Every day | ORAL | Status: DC
  Administered 2017-02-14: 15:00:00 81 mg via ORAL
  Filled 2017-02-14: qty 1

## 2017-02-14 MED ORDER — HYDROMORPHONE HCL 1 MG/ML IJ SOLN
0.5000 mg | INTRAMUSCULAR | Status: DC | PRN
  Administered 2017-02-14 – 2017-02-18 (×14): 0.5 mg via INTRAVENOUS
  Filled 2017-02-14: qty 1
  Filled 2017-02-14: qty 0.5
  Filled 2017-02-14 (×2): qty 1
  Filled 2017-02-14 (×3): qty 0.5
  Filled 2017-02-14 (×3): qty 1
  Filled 2017-02-14 (×2): qty 0.5
  Filled 2017-02-14: qty 1
  Filled 2017-02-14: qty 0.5
  Filled 2017-02-14: qty 1

## 2017-02-14 MED ORDER — ALBUTEROL SULFATE (2.5 MG/3ML) 0.083% IN NEBU: 2.5000 mg | INHALATION_SOLUTION | RESPIRATORY_TRACT | Status: DC

## 2017-02-14 MED ORDER — PANTOPRAZOLE SODIUM 40 MG IV SOLR
40.0000 mg | Freq: Two times a day (BID) | INTRAVENOUS | Status: DC
  Administered 2017-02-14 – 2017-02-18 (×7): 40 mg via INTRAVENOUS
  Filled 2017-02-14 (×8): qty 40

## 2017-02-14 MED ORDER — HYDROMORPHONE HCL 1 MG/ML IJ SOLN: 0.5000 mg | INTRAMUSCULAR | Status: DC | PRN

## 2017-02-14 MED ORDER — TECHNETIUM TC 99M MEDRONATE IV KIT
25.0000 | PACK | Freq: Once | INTRAVENOUS | Status: AC | PRN
  Administered 2017-02-14: 10:00:00 22.28 via INTRAVENOUS

## 2017-02-14 MED ORDER — NICOTINE 21 MG/24HR TD PT24
21.0000 mg | MEDICATED_PATCH | Freq: Every day | TRANSDERMAL | Status: DC
  Administered 2017-02-14 – 2017-02-18 (×5): 21 mg via TRANSDERMAL
  Filled 2017-02-14 (×5): qty 1

## 2017-02-14 MED ORDER — GUAIFENESIN ER 600 MG PO TB12
600.0000 mg | ORAL_TABLET | Freq: Two times a day (BID) | ORAL | Status: DC
  Administered 2017-02-14 – 2017-02-18 (×7): 600 mg via ORAL
  Filled 2017-02-14 (×8): qty 1

## 2017-02-14 MED ORDER — FENTANYL CITRATE (PF) 100 MCG/2ML IJ SOLN: 12.5000 ug | Freq: Once | INTRAMUSCULAR | Status: DC

## 2017-02-14 NOTE — Progress Notes (Signed)
Clinical Education officer, museum (CSW) received SNF consult. CSW will follow up after PT recommendations.   [employer], LCSW [phone]

## 2017-02-14 NOTE — Progress Notes (Signed)
[facility] [place] [facility] NAME: Dawn Allen    MR#:  [id number]  DATE OF BIRTH:  11-07-1950  SUBJECTIVE:  CHIEF COMPLAINT:   Chief Complaint  Patient presents with  . Back Pain   The patient is 66 year old female with history of metastatic breast cancer, who presents to the hospital with complaints of severe back pain. She has known metastatic breast cancer with radiation to liver and bone. She was recently seen by her primary oncologist [name], who recommended a bone scan, which was not performed.. Patient returns back due to significant pain. She explains that her pain is mostly in mid thoracic lower thoracic area and related to movements. Review of Systems  Constitutional: Negative for chills, fever and weight loss.  HENT: Negative for congestion.   Eyes: Negative for blurred vision and double vision.  Respiratory: Negative for cough, sputum production, shortness of breath and wheezing.   Cardiovascular: Positive for chest pain. Negative for palpitations, orthopnea, leg swelling and PND.  Gastrointestinal: Positive for abdominal pain. Negative for blood in stool, constipation, diarrhea, nausea and vomiting.  Genitourinary: Negative for dysuria, frequency, hematuria and urgency.  Musculoskeletal: Positive for back pain. Negative for falls.  Neurological: Negative for dizziness, tremors, focal weakness and headaches.  Endo/Heme/Allergies: Does not bruise/bleed easily.  Psychiatric/Behavioral: Negative for depression. The patient does not have insomnia.     VITAL SIGNS: Blood pressure (!) 91/48, pulse 91, temperature 98.6 F (37 C), temperature source Oral, resp. rate 20, height 5\' 5"  (1.651 m), weight 73.2 kg (161 lb 6.4 oz), SpO2 90 %.  PHYSICAL EXAMINATION:   GENERAL:  66 y.o.-year-old patient lying in the bed with no acute distress.  EYES: Pupils equal, round, reactive to light and accommodation. No scleral icterus.  Extraocular muscles intact.  HEENT: Head atraumatic, normocephalic. Oropharynx and nasopharynx clear.  NECK:  Supple, no jugular venous distention. No thyroid enlargement, no tenderness. Palpation of thoracic spine reveals tenderness in lower thoracic area, lumbar area, but not in chest LUNGS: Normal breath sounds bilaterally, no wheezing, rales,rhonchi or crepitation. No use of accessory muscles of respiration.  CARDIOVASCULAR: S1, S2 normal. No murmurs, rubs, or gallops.  ABDOMEN: Soft, tender diffusely, mostly in the right side, liver edge is hard, tender to touch. Abdomen is nondistended. Bowel sounds present. No organomegaly or mass.  EXTREMITIES: No pedal edema, cyanosis, or clubbing.  NEUROLOGIC: Cranial nerves II through XII are intact. Muscle strength 5/5 in all extremities. Sensation intact. Gait not checked.  PSYCHIATRIC: The patient is alert and oriented x 3.  SKIN: No obvious rash, lesion, or ulcer.   ORDERS/RESULTS REVIEWED:   CBC  Recent Labs Lab 02/08/17 2159 02/12/17 1239 02/13/17 1916 02/14/17 0513  WBC 8.3 8.2 8.8 8.6  HGB 13.2 13.4 10.7* 9.6*  HCT 39.1 40.5 32.6* 28.7*  PLT 187 155 165 156  MCV 86.2 89.9 89.5 89.8  MCH 29.0 29.7 29.5 30.0  MCHC 33.6 33.1 32.9 33.4  RDW 17.7* 17.5* 17.4* 17.1*   ------------------------------------------------------------------------------------------------------------------  Chemistries   Recent Labs Lab 02/08/17 2159 02/12/17 0945 02/13/17 0452 02/13/17 1916 02/14/17 0036 02/14/17 0513  NA 128* 128* 131* 131*  --  130*  K 2.9* 4.8 3.8 5.0  --  4.5  CL 90* 94* 99* 95*  --  100*  CO2 25 26 25 25   --  24  GLUCOSE 142* 204* 166* 168*  --  146*  BUN 28* 21* 31* 41*  --  49*  CREATININE 0.73 0.53 0.61 0.75  --  0.70  CALCIUM 8.8* 8.9 8.3* 8.5*  --  8.2*  MG  --   --   --   --  2.0  --   AST 156* 169* 169* 169*  --  155*  ALT 124* 89* 80* 79*  --  72*  ALKPHOS 1,171* 995* 861* 828*  --  734*  BILITOT 12.0* 14.4*  12.5* 12.3*  --  11.0*   ------------------------------------------------------------------------------------------------------------------ estimated creatinine clearance is 70.3 mL/min (by C-G formula based on SCr of 0.7 mg/dL). ------------------------------------------------------------------------------------------------------------------ No results for input(s): TSH, T4TOTAL, T3FREE, THYROIDAB in the last 72 hours.  Invalid input(s): FREET3  Cardiac Enzymes  Recent Labs Lab 02/14/17 0036 02/14/17 0513 02/14/17 1119  TROPONINI 0.05* 0.06* 0.08*   ------------------------------------------------------------------------------------------------------------------ Invalid input(s): POCBNP ---------------------------------------------------------------------------------------------------------------  RADIOLOGY: No results found.  EKG:  Orders placed or performed during the hospital encounter of 02/13/17  . EKG 12-Lead    ASSESSMENT AND PLAN:  Active Problems:   Intractable back pain  #1. Intractable back pain concerning for metastasis to the bone, getting bone scan, oncologist consultation, continue pain medications, adding Lidoderm patch, K pad #2. Hyponatremia due to SIADH, initiate fluid restrictions, follow sodium level tomorrow morning #3. Malnutrition, hypoalbuminemia, get dietitian to see patient, palliative care consultation is requested #4. Elevated transaminases due to infiltrative disease, seems to be stable, supportive therapy #5. Elevated troponin, hypertension, relative hypoxia, concerning for pulmonary embolism, chest x-ray, CT angiogram. If chest x-ray is needed due to high probability, get echo. Unable to use nitroglycerin on metoprolol due to hypotension, initiate aspirin.   Management plans discussed with the patient, family and they are in agreement.   DRUG ALLERGIES:  Allergies  Allergen Reactions  . Penicillins Swelling    rash    CODE STATUS:      Code Status Orders        Start     Ordered   02/13/17 2323  Full code  Continuous     02/13/17 2322    Code Status History    Date Active Date Inactive Code Status Order ID Comments User Context   02/09/2017  4:22 AM 02/13/2017  5:01 PM Full Code [id number]  Harrie Foreman, MD Inpatient   03/02/2015  3:42 PM 03/05/2015  5:21 PM Full Code [id number]  Idelle Crouch, MD Inpatient   02/27/2015  4:48 PM 02/28/2015 11:06 AM Full Code [id number]  Theodoro Grist, MD Inpatient      TOTAL TIME TAKING CARE OF THIS PATIENT: 40 minutes.    Theodoro Grist M.D on 02/14/2017 at 1:35 PM  Between 7am to 6pm - Pager - [phone]  After 6pm go to [email or web address] - password [email or web address]  [facility]  Office  [phone]  CC: Primary care physician; Lorelee Market, MD

## 2017-02-14 NOTE — Plan of Care (Signed)
Problem: Education: Goal: Knowledge of [facility] Education information/materials will improve Outcome: Progressing Pt likes to be called Dawn Allen  Past Medical History:  Diagnosis Date  . Anxiety   . Breast cancer ([place]) 08/2015   right breast, chemo  . Cancer of right female breast ([place])   . Coronary artery disease    a. NSTEMI cath 08/02/14: LM nl, pLAD 50%, mLCx 30%, mRCA 95% s/p PCI/DES, 2nd lesion 40%, EF 55%  . Diabetes mellitus without complication ([place])   . DVT (deep venous thrombosis) (HCC)    LEFT LEG   . HLD (hyperlipidemia)   . Hypertension   . Lung cancer ([place])   . Lung nodule   . MI (myocardial infarction) ([place])   . Seizure disorder ([place])   . Seizures ([place])   . Squamous cell lung cancer ([place]) 04/23/2015   Pt is well controlled with home medications

## 2017-02-14 NOTE — Progress Notes (Signed)
[facility] responded to an OR for room 124. Pt presented sad as she has returned to the hospital after being discharged the day before. Pt has had many treatments and procedures and feels that there is a reason she has survived so far. [facility] provided the ministry of empathetic listening and prayer. [place] will follow up on next rounding.    02/14/17 1000  Clinical Encounter Type  Visited With Patient;Health care provider  Visit Type Initial;Spiritual support  Referral From Nurse  Consult/Referral To Chaplain  Spiritual Encounters  Spiritual Needs Prayer;Emotional

## 2017-02-14 NOTE — Progress Notes (Signed)
PT Cancellation Note  Patient Details Name: Dawn Allen MRN: [id number] DOB: April 24, 1951   Cancelled Treatment:    Reason Eval/Treat Not Completed: Medical issues which prohibited therapy (Consult received and chart reviewed.  Patient currently pending PET scan at noon today (received injection at 0900 this AM).  Activity contraindicated until test complete.  Will re-attempt at later time/date as medically appropriate.)   Berk Pilot H. Owens Shark, PT, DPT, NCS 02/14/17, 10:56 AM [phone]

## 2017-02-14 NOTE — Consult Note (Signed)
Consultation Note Date: 02/14/2017   Patient Name: Dawn Allen  DOB: [birth year]  MRN: [id number]  Age / Sex: 66 y.o., female  PCP: Dawn Market, MD Referring Physician: Theodoro Grist, MD  Reason for Consultation: Establishing goals of care, Non pain symptom management and Pain control  HPI/Patient Profile: 66 y.o. female  with past medical history of metastatic breast cancer (to liver dx Jan 2017, was on palliative Taxol, last treatment was in May) and lung cancer (now with likely met pleural effusions, had radiation tx, no further treatments), anxiety, CAD, diabetes, DVT, HTN, MI, deizures admitted on 02/13/2017 with weakness and back pain. She was discharged on 6/23 for hyperammoniemia, jaundice and RUQ pain from progressing liver mets with biliary stent placement. Palliative medicine consulted for Icard and symptom management.   Clinical Assessment and Goals of Care: Patient in bed- very nauseated and complaining of pain in her back and shoulder. Pain is constant, unrelieved with pain medication. Patient tells me she received relief with dilaudid during last hospitalization. She did not have relief from nausea despite 38m IV zofran. She notes feeling that she needs to vomit, but not being able to vomit. Th She also c/o no appetite. Has not been eating for weeks. Chart review shows she has been losing significant amounts of weight recently.   Chart review shows she last received chemotherapy in May- palliative Taxol.  She canceled several appointments after that including a bone scan due to not feeling well, and a death in the hospital, and then was hospitalized and was found to have progressing liver disease.   Her daughter, Dawn Allen was at bedside. There is another daughter-Dawn Laymanas well as the patient's sister who also provides family support. Dawn Allen and the patient's sister both state they know patient isn't  doing well. They would like to have a full G[place]discussion with all family members present tomorrow.  Patient is agreeable to this.    Primary Decision Maker PATIENT    SUMMARY OF RECOMMENDATIONS  -Pain- likely from progressing hepatic metastasis 12.586m fentanyl IV now with 500cc NS over 2hrs as premed for bone scan (due to hypotension)  -then change to .32m46mydromorphone q3hr IV prn (patient states this medication worked well for her pain during last hospitalization- if pain can be controlled with IV dilaudid- then we can transition to oral dilaudid once nausea is controlled)  -Nausea- continues despite 4mg1m Ondansetron- give 4mg 49mamethasone IV now (this will decrease nausea and works as adjuvant for pain medication)- will reevaluate and consider adding scheduled dexamethasone in the morning   -Follow-up palliative meeting scheduled with patient and two daughters tomorrow at 1pm  [place] code    Symptom Management:   See above  Palliative Prophylaxis:   Frequent Pain Assessment  Additional Recommendations (Limitations, Scope, Preferences):  Full Scope Treatment  Psycho-social/Spiritual:   Desire for further Chaplaincy support:no  Additional Recommendations: Referral to Community Resources   Prognosis:    < 6 months  Discharge Planning:  To Be Determined  Primary Diagnoses: Present on Admission: . Intractable back pain   I have reviewed the medical record, interviewed the patient and family, and examined the patient. The following aspects are pertinent.  Past Medical History:  Diagnosis Date  . Anxiety   . Breast cancer ([place]) 08/2015   right breast, chemo  . Cancer of right female breast ([place])   . Coronary artery disease    a. NSTEMI cath 08/02/14: LM nl, pLAD 50%, mLCx 30%, mRCA 95% s/p PCI/DES, 2nd lesion 40%, EF 55%  . Diabetes mellitus without complication ([place])   . DVT (deep venous thrombosis) ([facility])    LEFT LEG     . HLD (hyperlipidemia)   . Hypertension   . Lung cancer ([place])   . Lung nodule   . MI (myocardial infarction) ([place])   . Seizure disorder ([place])   . Seizures ([place])   . 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 Marital status: Single    Spouse name: N/A  . Number of children: N/A  . Years of education: N/A   Social History Main Topics  . Smoking status: Current Every Day Smoker    Packs/day: 1.00    Years: 45.00    Types: Cigarettes  . Smokeless tobacco: Never Used  . Alcohol use No  . Drug use: No  . Sexual activity: Yes    Partners: Male   Other Topics Concern  . None   Social History Narrative  . None   Family History  Problem Relation Age of Onset  . Heart attack Mother   . Breast cancer Sister 81       bilaterally breast cancer   Scheduled Meds: . budesonide (PULMICORT) nebulizer solution  0.25 mg Nebulization BID  . feeding supplement (ENSURE ENLIVE)  237 mL Oral BID BM  . guaiFENesin  600 mg Oral BID  . insulin aspart  0-15 Units Subcutaneous TID WC  . insulin aspart  0-5 Units Subcutaneous QHS  . ipratropium-albuterol  3 mL Nebulization Q6H  . lidocaine  2 patch Transdermal Q24H  . linagliptin  5 mg Oral Daily  . lisinopril  20 mg Oral Daily  . nicotine  21 mg Transdermal Daily  . phenytoin  300 mg Oral Daily  . warfarin  3 mg Oral q1800  . Warfarin - Pharmacist Dosing Inpatient   Does not apply q1800   Continuous Infusions: . sodium chloride 75 mL/hr at 02/14/17 0008  . sodium chloride 250 mL/hr at 02/14/17 1245   PRN Meds:.albuterol, bisacodyl, diazepam, gi cocktail, HYDROmorphone (DILAUDID) injection, lactulose, lidocaine-prilocaine, magnesium citrate, nitroGLYCERIN, ondansetron **OR** ondansetron (ZOFRAN) IV, oxyCODONE, senna-docusate, zolpidem Medications Prior to Admission:  Prior to Admission medications   Medication Sig Start Date End Date Taking? Authorizing Provider  diazepam (VALIUM) 5 MG tablet Take  1 tablet (5 mg total) by mouth 2 (two) times daily as needed. Reported on 12/16/2015 02/13/17  Yes Wieting, Richard, MD  empagliflozin (JARDIANCE) 25 MG TABS tablet Take 25 mg by mouth daily.   Yes [provider]  feeding supplement, ENSURE ENLIVE, (ENSURE ENLIVE) LIQD Take 237 mLs by mouth 2 (two) times daily between meals. 02/13/17  Yes Wieting, Richard, MD  HYDROcodone-acetaminophen (NORCO/VICODIN) 5-325 MG tablet Take 1 tablet by mouth every 6 (six) hours as needed for moderate pain. 02/13/17  Yes Wieting, Richard, MD  lactulose (CHRONULAC) 10 GM/15ML solution Take 30 mLs (20 g total) by mouth daily as needed for  mild constipation. 02/13/17  Yes Wieting, Richard, MD  lidocaine-prilocaine (EMLA) cream Apply 1 application topically as needed. Apply to port a cath site 1 hour before chemotherapy treatment. 12/14/16  Yes Cammie Sickle, MD  lisinopril (PRINIVIL,ZESTRIL) 20 MG tablet Take 1 tablet (20 mg total) by mouth daily. 02/13/17  Yes Wieting, Richard, MD  nitroGLYCERIN (NITROSTAT) 0.4 MG SL tablet Place 0.4 mg under the tongue as needed. 01/14/17  Yes [provider]  phenytoin (DILANTIN) 100 MG ER capsule Take 300 mg by mouth daily.    Yes [provider]  TRADJENTA 5 MG TABS tablet Take 5 mg by mouth daily. 01/19/17  Yes [provider]  VENTOLIN HFA 108 (90 Base) MCG/ACT inhaler Inhale 1 puff into the lungs every 6 (six) hours as needed. Reported on 01/20/2016 01/09/16  Yes [provider]  warfarin (COUMADIN) 3 MG tablet Take 1 tablet (3 mg total) by mouth daily at 6 PM. 02/13/17  Yes Loletha Grayer, MD   Allergies  Allergen Reactions  . Penicillins Swelling    rash   Review of Systems  Constitutional: Positive for activity change, appetite change and chills.  Respiratory: Positive for shortness of breath.   Cardiovascular: Positive for chest pain.  Gastrointestinal: Positive for abdominal pain, nausea and vomiting.  Musculoskeletal:  Positive for back pain and myalgias. Negative for gait problem.  Psychiatric/Behavioral: Negative for confusion.    Physical Exam  Constitutional: She appears well-developed. She appears distressed.  Eyes: Scleral icterus is present.  Cardiovascular: Normal rate and regular rhythm.   Abdominal: Soft. Bowel sounds are normal. She exhibits distension. There is tenderness.  Skin: Skin is warm and dry.  Nursing note and vitals reviewed.   Vital Signs: BP (!) 91/48 (BP Location: Left Arm)   Pulse 91   Temp 98.6 F (37 C) (Oral)   Resp 20   Ht '5\' 5"'$  (1.651 m)   Wt 73.2 kg (161 lb 6.4 oz)   SpO2 90%   BMI 26.86 kg/m  Pain Assessment: 0-10   Pain Score: 7    SpO2: SpO2: 90 % O2 Device:SpO2: 90 % O2 Flow Rate: .   IO: Intake/output summary:  Intake/Output Summary (Last 24 hours) at 02/14/17 1248 Last data filed at 02/14/17 0800  Gross per 24 hour  Intake              754 ml  Output                0 ml  Net              754 ml    LBM: Last BM Date: 02/10/17 (per pt) Baseline Weight: Weight: 72.1 kg (159 lb) Most recent weight: Weight: 73.2 kg (161 lb 6.4 oz)     Palliative Assessment/Data: PPS: 30%   Flowsheet Rows     Most Recent Value  Intake Tab  Referral Department  Hospitalist  Unit at Time of Referral  Oncology Unit  Palliative Care Primary Diagnosis  Cancer  Date Notified  02/13/17  Palliative Care Type  New Palliative care  Reason for referral  Clarify Goals of Care  Date of Admission  02/13/17  # of days IP prior to Palliative referral  0  Clinical Assessment  Psychosocial & Spiritual Assessment  Palliative Care Outcomes      Thank you for this consult. Palliative medicine will continue to follow and assist as needed.   Time In: 1145 Time Out: 1245 Time Total: 60 minutes Greater  than 50%  of this time was spent counseling and coordinating care related to the above assessment and plan.  Signed by: Mariana Kaufman, AGNP-C Palliative Medicine     Please contact Palliative Medicine Team phone at [phone] for questions and concerns.  For individual provider: See Shea Evans

## 2017-02-14 NOTE — Consult Note (Signed)
[place] CONSULT NOTE  Patient Care Team: Lorelee Market, MD as PCP - General (Family Medicine)  CHIEF COMPLAINTS/PURPOSE OF CONSULTATION:  Metastatic breast cancer  HISTORY OF PRESENTING ILLNESS:  Dawn Allen 66 y.o.  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   Patient was recently evaluated by my partner [name]- in the absence- when patient was noted to have significant elevation of bilirubin. MRI of the liver showed extrinsic compression-  Which led to ERCP with stent placement. Bilirubin slightly improved from 14-11 today. Patient was discharged yesterday; and readmitted later in the evening because of worsening pain/weakness and shortness of breath.   Patient completed a poor appetite. Complains of weight loss. Complains of cough. Chronic constipation. Denies any tingling and numbness.denies any fevers or chills.  ROS: A complete 10 point review of system is done which is negative except mentioned above in history of present illness  MEDICAL HISTORY:  Past Medical History:  Diagnosis Date  . Anxiety   . Breast cancer ([place]) 08/2015   right breast, chemo  . Cancer of right female breast ([place])   . Coronary artery disease    a. NSTEMI cath 08/02/14: LM nl, pLAD 50%, mLCx 30%, mRCA 95% s/p PCI/DES, 2nd lesion 40%, EF 55%  . Diabetes mellitus without complication ([place])   . DVT (deep venous thrombosis) ([facility])    LEFT LEG   . HLD (hyperlipidemia)   . Hypertension   . Lung cancer ([place])   . Lung nodule   . MI (myocardial infarction) ([place])   . Seizure disorder ([place])   . Seizures ([place])   . Squamous cell lung cancer ([place]) 04/23/2015    SURGICAL HISTORY: Past Surgical History:  Procedure Laterality Date  . ABDOMINAL HYSTERECTOMY     PARTIAL   . AXILLARY LYMPH NODE BIOPSY Right 05/26/2016    Procedure: AXILLARY LYMPH NODE BIOPSY;  Surgeon: Hubbard Robinson, MD;  Location: [facility] ORS;  Service: General;  Laterality: Right;  . BREAST BIOPSY Right 09/15/2015   positve  . CARDIAC CATHETERIZATION  08/02/2014  . CORONARY ANGIOPLASTY  08/02/2014   drug eluting stent placement  . ENDOSCOPIC RETROGRADE CHOLANGIOPANCREATOGRAPHY (ERCP) WITH PROPOFOL N/A 02/11/2017   Procedure: ENDOSCOPIC RETROGRADE CHOLANGIOPANCREATOGRAPHY (ERCP) WITH PROPOFOL;  Surgeon: Lucilla Lame, MD;  Location: [facility] ENDOSCOPY;  Service: Endoscopy;  Laterality: N/A;  . IVC FILTER PLACEMENT ([place] HX)    . LEG SURGERY Right    BLOOD CLOT  . MASTECTOMY, PARTIAL Right 05/26/2016   Procedure: MASTECTOMY PARTIAL;  Surgeon: Hubbard Robinson, MD;  Location: [facility] ORS;  Service: General;  Laterality: Right;  . PORT-A-CATH REMOVAL Left 01/18/2017   Procedure: REMOVAL PORT-A-CATH;  Surgeon: Vickie Epley, MD;  Location: [facility] ORS;  Service: General;  Laterality: Left;  . PORTACATH PLACEMENT Left 10/13/2015   Procedure: INSERTION PORT-A-CATH;  Surgeon: Hubbard Robinson, MD;  Location: [facility] ORS;  Service: General;  Laterality: Left;  . PORTACATH PLACEMENT Right 01/18/2017   Procedure: INSERTION PORT-A-CATH;  Surgeon: Vickie Epley, MD;  Location: [facility] ORS;  Service: General;  Laterality: Right;  . SENTINEL NODE BIOPSY Right 05/26/2016   Procedure: SENTINEL NODE BIOPSY;  Surgeon: Hubbard Robinson, MD;  Location: [facility] ORS;  Service: General;  Laterality: Right;    SOCIAL HISTORY: Social History   Social History  . Marital status: Single    Spouse name: N/A  .  Number of children: N/A  . Years of education: N/A   Occupational History  . Not on file.   Social History Main Topics  . Smoking status: Current Every Day Smoker    Packs/day: 1.00    Years: 45.00    Types: Cigarettes  . Smokeless tobacco: Never Used  . Alcohol use No  . Drug use: No  . Sexual activity: Yes    Partners: Male   Other Topics Concern  .  Not on file   Social History Narrative  . No narrative on file    FAMILY HISTORY: Family History  Problem Relation Age of Onset  . Heart attack Mother   . Breast cancer Sister 20       bilaterally breast cancer    ALLERGIES:  is allergic to penicillins.  MEDICATIONS:  Current Facility-Administered Medications  Medication Dose Route Frequency Provider Last Rate Last Dose  . 0.9 %  sodium chloride infusion   Intravenous Continuous Hugelmeyer, Alexis, DO 75 mL/hr at 02/14/17 1457    . albuterol (PROVENTIL) (2.5 MG/3ML) 0.083% nebulizer solution 2.5 mg  2.5 mg Nebulization Q6H PRN Hugelmeyer, Alexis, DO      . bisacodyl (DULCOLAX) EC tablet 5 mg  5 mg Oral Daily PRN Hugelmeyer, Alexis, DO      . budesonide (PULMICORT) nebulizer solution 0.25 mg  0.25 mg Nebulization BID Theodoro Grist, MD   0.25 mg at 02/14/17 2010  . diazepam (VALIUM) tablet 5 mg  5 mg Oral BID PRN Hugelmeyer, Alexis, DO      . feeding supplement (ENSURE ENLIVE) (ENSURE ENLIVE) liquid 237 mL  237 mL Oral BID BM Ether Griffins, Rima, MD   237 mL at 02/14/17 1505  . gi cocktail (Maalox,Lidocaine,Donnatal)  30 mL Oral TID PRN Hugelmeyer, Alexis, DO   30 mL at 02/13/17 2230  . guaiFENesin (MUCINEX) 12 hr tablet 600 mg  600 mg Oral BID Theodoro Grist, MD   600 mg at 02/14/17 1032  . HYDROmorphone (DILAUDID) injection 0.5 mg  0.5 mg Intravenous Q3H PRN Earlie Counts, NP      . insulin aspart (novoLOG) injection 0-15 Units  0-15 Units Subcutaneous TID WC Hugelmeyer, Alexis, DO   3 Units at 02/14/17 1818  . insulin aspart (novoLOG) injection 0-5 Units  0-5 Units Subcutaneous QHS Hugelmeyer, Alexis, DO      . ipratropium-albuterol (DUONEB) 0.5-2.5 (3) MG/3ML nebulizer solution 3 mL  3 mL Nebulization Q6H Theodoro Grist, MD   3 mL at 02/14/17 2010  . lactulose (CHRONULAC) 10 GM/15ML solution 20 g  20 g Oral Daily PRN Hugelmeyer, Alexis, DO      . lidocaine (LIDODERM) 5 % 2 patch  2 patch Transdermal Q24H Theodoro Grist, MD   2 patch at  02/14/17 1032  . lidocaine-prilocaine (EMLA) cream 1 application  1 application Topical PRN Hugelmeyer, Alexis, DO      . linagliptin (TRADJENTA) tablet 5 mg  5 mg Oral Daily Hugelmeyer, Alexis, DO   5 mg at 02/14/17 1032  . lisinopril (PRINIVIL,ZESTRIL) tablet 20 mg  20 mg Oral Daily Hugelmeyer, Alexis, DO      . magnesium citrate solution 1 Bottle  1 Bottle Oral Once PRN Hugelmeyer, Alexis, DO      . nicotine (NICODERM CQ - dosed in mg/24 hours) patch 21 mg  21 mg Transdermal Daily Theodoro Grist, MD   21 mg at 02/14/17 1032  . nitroGLYCERIN (NITROSTAT) SL tablet 0.4 mg  0.4 mg Sublingual Q5 min PRN Hugelmeyer, Alexis, DO      .  ondansetron (ZOFRAN) tablet 4 mg  4 mg Oral Q6H PRN Hugelmeyer, Alexis, DO       Or  . ondansetron (ZOFRAN) injection 4 mg  4 mg Intravenous Q6H PRN Hugelmeyer, Alexis, DO   4 mg at 02/14/17 1104  . oxyCODONE (Oxy IR/ROXICODONE) immediate release tablet 5 mg  5 mg Oral Q4H PRN Theodoro Grist, MD      . pantoprazole (PROTONIX) injection 40 mg  40 mg Intravenous Q12H Theodoro Grist, MD      . phenytoin (DILANTIN) ER capsule 300 mg  300 mg Oral Daily Hugelmeyer, Alexis, DO   300 mg at 02/14/17 1032  . senna-docusate (Senokot-S) tablet 1 tablet  1 tablet Oral QHS PRN Hugelmeyer, Alexis, DO      . zolpidem (AMBIEN) tablet 5 mg  5 mg Oral QHS PRN Hugelmeyer, Alexis, DO       Facility-Administered Medications Ordered in Other Encounters  Medication Dose Route Frequency Provider Last Rate Last Dose  . albuterol (PROVENTIL) (2.5 MG/3ML) 0.083% nebulizer solution 2.5 mg  2.5 mg Nebulization Once Nestor Lewandowsky, MD      . sodium chloride flush (NS) 0.9 % injection 10 mL  10 mL Intravenous PRN Cammie Sickle, MD   10 mL at 12/01/15 0850      .  PHYSICAL EXAMINATION:  Vitals:   02/14/17 1202 02/14/17 1950  BP: (!) 91/48 (!) 102/54  Pulse: 91 93  Resp: 20 (!) 22  Temp: 98.6 F (37 C) 98.9 F (37.2 C)   Filed Weights   02/13/17 1812 02/13/17 2337  Weight: 159  lb (72.1 kg) 161 lb 6.4 oz (73.2 kg)    GENERAL: Well-nourished well-developed; Alert, no distress and comfortable.   Alone. EYES: positive for jaundice. OROPHARYNX: no thrush or ulceration. NECK: supple, no masses felt LYMPH:  no palpable lymphadenopathy in the cervical, axillary or inguinal regions LUNGS: decreased breath sounds to auscultation at bases and  No wheeze or crackles HEART/CVS: regular rate & rhythm and no murmurs; No lower extremity edema ABDOMEN: abdomen soft, non-tender and normal bowel sounds Musculoskeletal:no cyanosis of digits and no clubbing  PSYCH: alert & oriented x 3 with fluent speech NEURO: no focal motor/sensory deficits SKIN:  no rashes or significant lesions  LABORATORY DATA:  I have reviewed the data as listed Lab Results  Component Value Date   WBC 8.6 02/14/2017   HGB 8.9 (L) 02/14/2017   HCT 28.7 (L) 02/14/2017   MCV 89.8 02/14/2017   PLT 156 02/14/2017    Recent Labs  02/09/17 0803  02/13/17 0452 02/13/17 1916 02/14/17 0513  NA  --   < > 131* 131* 130*  K  --   < > 3.8 5.0 4.5  CL  --   < > 99* 95* 100*  CO2  --   < > 25 25 24   GLUCOSE  --   < > 166* 168* 146*  BUN  --   < > 31* 41* 49*  CREATININE  --   < > 0.61 0.75 0.70  CALCIUM  --   < > 8.3* 8.5* 8.2*  GFRNONAA  --   < > >60 >60 >60  GFRAA  --   < > >60 >60 >60  PROT  --   < > 5.8* 6.1* 5.5*  ALBUMIN  --   < > 2.0* 2.0* 1.8*  AST  --   < > 169* 169* 155*  ALT  --   < > 80* 79* 72*  ALKPHOS  --   < >  861* 828* 734*  BILITOT  --   < > 12.5* 12.3* 11.0*  BILIDIR 7.1*  --   --   --   --   < > = values in this interval not displayed.  RADIOGRAPHIC STUDIES: I have personally reviewed the radiological images as listed and agreed with the findings in the report. Ct Chest Wo Contrast  Result Date: 02/14/2017 CLINICAL DATA:  Initial evaluation for pleural effusion. EXAM: CT CHEST WITHOUT CONTRAST TECHNIQUE: Multidetector CT imaging of the chest was performed following the standard  protocol without IV contrast. COMPARISON:  Prior radiograph from earlier the same day. FINDINGS: Cardiovascular: Intrathoracic aorta of normal caliber without acute abnormality. Moderate atheromatous plaque present throughout the visualized aorta. Partially visualized great vessels grossly within normal limits. Heart size normal. Diffuse 3 vessel coronary artery calcifications. Possible few scattered areas of irregular pleural thickening, most notable on the right (series 2, image 72). Mediastinum/Nodes: Visualized thyroid somewhat heterogeneous. 9 mm right cardiophrenic node (series 2, image 92). Additional nodular soft tissue thickening at the left cardiophrenic angle measuring up to 12 mm (series 2, image 97). Findings concerning for metastatic disease, and appear progressed from prior. No other definite pathologically enlarged mediastinal, hilar or, or axillary lymph nodes identified on this noncontrast examination. The esophagus normal. Small hiatal hernia. Lungs/Pleura: Tracheobronchial tree is patent centrally. There has been interval development of a large left pleural effusion. Irregular opacity along the left major fissure likely related to fluid tracking along the fissure. Few scattered areas of irregular pleural thickening present along the visualized anterior and medial pleural surface (series 2, image 65), concerning for metastatic disease. Associated left lower lobe atelectasis/collapse. There is a new 11 mm left lower lobe pulmonary nodule (series 3, image 43), concerning for metastatic disease. Additional scattered multifocal ground-glass opacities within the bilateral upper lobes, which may reflect post radiation changes or infiltrates. Small layering right pleural effusion present as well. No pneumothorax. New 9 mm right lower lobe nodule (series 3, image 87). Upper Abdomen: Previously identified mass within the central aspect the liver not well visualized due to lack of IV contrast, however,  there is suggestion that this lesion has increased in size and is now poorly defined and infiltrative. Additionally, there is seen likely a few additional new lesions within the peripheral subcapsular left hepatic lobe measuring up to approximately 16 mm (series 2, image 116). Diffuse thickening of the bilateral adrenal glands grossly similar. Remainder of the visualized upper abdomen otherwise up grossly unremarkable. Musculoskeletal: 5.2 cm hypodense collection at the lateral right breast, likely postoperative seroma and/or hematoma, similar to previous. Diffuse anasarca. No acute osseous abnormality. No worrisome lytic or blastic osseous lesions. IMPRESSION: 1. Interval development of large left pleural effusion. Scattered areas of irregular/nodular pleural thickening about the left lung suggest possible pleural-based metastases, suggesting at this effusion is malignant in nature. Question additional irregular nodular thickening involving the pericardial surface as well. No significant pericardial effusion on this exam. 2. Associated left lower lobe atelectasis/consolidation. 3. New bilateral pulmonary nodules involving the bilateral lower lobes, concerning for metastatic disease. 4. New bilateral cardiophrenic nodes/soft tissue thickening, consistent with metastatic disease. 5. Poor visualization of previously identified hepatic metastasis involving the central aspect of the liver, although suspected to be worsened with increased size and poorly infiltrated nature. Probable additional new hepatic lesions within the subcapsular left hepatic lobe as above. 6. Small layering right pleural effusion. 7. Scattered patchy ground-glass opacity within the peripheral right upper lobe. Findings favored to reflect  postradiation changes, although infiltrates could be considered in the correct clinical setting. 8. Persistent 5.2 cm hypodense collection within the peripheral right breast, consistent with a postoperative seroma  or possibly hematoma, similar to previous. Electronically Signed   By: Jeannine Boga M.D.   On: 02/14/2017 19:42   Nm Bone Scan Whole Body  Result Date: 02/14/2017 CLINICAL DATA:  66 year old female with restaging of metastatic breast cancer. EXAM: NUCLEAR MEDICINE WHOLE BODY BONE SCAN TECHNIQUE: Whole body anterior and posterior images were obtained approximately 3 hours after intravenous injection of radiopharmaceutical. RADIOPHARMACEUTICALS:  22.28 mCi Technetium-88m MDP IV COMPARISON:  02/09/2017 MRI, 02/04/2017 CT and prior exams FINDINGS: Focal increased activity within the anterior right sixth and seventh ribs are compatible with subacute fractures when correlated with recent CT. No abnormalities identified to suggest bony metastatic disease. Increased activity within the knees and lower lumbar spine are compatible with degenerative changes. IMPRESSION: No evidence of bony metastatic disease. Subacute fractures within the anterior right sixth and seventh ribs. Degenerative changes within the knees and lower lumbar spine. Electronically Signed   By: Margarette Canada M.D.   On: 02/14/2017 13:55   Mr 3d Recon At Scanner  Result Date: 02/09/2017 CLINICAL DATA:  Right breast cancer with hepatic metastatic disease. Elevated liver function studies. EXAM: MRI ABDOMEN WITHOUT AND WITH CONTRAST (INCLUDING MRCP) TECHNIQUE: Multiplanar multisequence MR imaging of the abdomen was performed both before and after the administration of intravenous contrast. Heavily T2-weighted images of the biliary and pancreatic ducts were obtained, and three-dimensional MRCP images were rendered by post processing. CONTRAST:  51mL MULTIHANCE GADOBENATE DIMEGLUMINE 529 MG/ML IV SOLN COMPARISON:  PET-CT 10/13/2016.  Abdominal CT 02/04/2017. FINDINGS: Lower chest: Complex left pleural effusion is again partially imaged with irregular pleural thickening and enhancement, likely malignant. No significant pleural fluid on the right.  Grossly stable fluid collection laterally in the right breast without suspicious enhancement. Hepatobiliary: Again demonstrated is widespread hepatic metastatic disease with a large conglomerate mass centrally in the liver measuring up to 11.9 x 10.0 x 13.1 cm. This partially encases and exerts mass effect on the hepatic and portal veins as well as the IVC. No tumor thrombus identified. There is probable extrinsic compression of the biliary system in the hepatic hilum. Mild intrahepatic biliary dilatation is present. The common bile duct is decompressed. The gallbladder is collapsed. Apart from the dominant central liver mass, there are multiple smaller lesions, similar to recent CT. Pancreas: Unremarkable. No pancreatic ductal dilatation or surrounding inflammatory changes. Spleen: Normal in size without focal abnormality. Adrenals/Urinary Tract: Small bilateral adrenal masses are grossly stable from previous studies and were not hypermetabolic on PET-CT, probably adenomas or hyperplasia. There are several cysts within the lower pole of the right kidney. The left kidney appears normal. No hydronephrosis. Stomach/Bowel: No evidence of bowel wall thickening, distention or surrounding inflammatory change. Vascular/Lymphatic: Stable adenopathy within the porta hepatis, potentially contributing to mass effect on the biliary system. No acute vascular findings. As above, there is extrinsic mass effect on the intrahepatic vasculature by the large central hepatic mass. Aortic and branch vessel atherosclerosis better demonstrated by CT. IVC filter noted. Other: No ascites or peritoneal nodularity. Musculoskeletal: No acute or significant osseous findings. IMPRESSION: 1. Large conglomerate central hepatic metastases likely contribute to extrinsic compression of the biliary system in the porta hepatis, contributing to the patient's elevated liver function studies. The common bile duct is decompressed. 2. Probable metastatic  lymphadenopathy within the porta hepatis. 3. Probable malignant left pleural effusion. 4.  Stable bilateral adrenal adenomas or hyperplasia. Electronically Signed   By: Richardean Sale M.D.   On: 02/09/2017 13:31   Ct Abdomen W Wo Contrast  Result Date: 02/04/2017 CLINICAL DATA:  Right breast cancer with liver metastases, status post radiation therapy. Increased alkaline phosphatase and bilirubin, evaluate for obstruction. EXAM: CT ABDOMEN WITHOUT AND WITH CONTRAST TECHNIQUE: Multidetector CT imaging of the abdomen was performed following the standard protocol before and following the bolus administration of intravenous contrast. CONTRAST:  100 mL Isovue 370 IV COMPARISON:  CT abdomen/ pelvis dated 10/26/2016 FINDINGS: Lower chest: Moderate left pleural effusion with associated pleural thickening/enhancement, likely malignant. Patchy left lower lobe opacity, possibly atelectasis. 6 mm right lower lobe nodule at the right lung base (series 3/ image 6), new. Hepatobiliary: Numerous hepatic metastases in both lobes, progressed from the prior. Dominant 9.6 cm aggregate lesion centered in the anterior segment right hepatic lobe (series 9/ image 29), previously 2.9 cm. Gallbladder is decompressed. No intrahepatic or extrahepatic ductal dilatation. Pancreas: Within normal limits. Spleen: Within normal limits. Adrenals/Urinary Tract: Bilateral adrenal metastases, measuring up to 2.7 cm bilaterally, grossly unchanged. 2.5 cm lateral right lower pole renal cyst (series 9/image 52). Additional 12 mm medial right lower pole renal cyst (series 9/image 9). Left kidney is within normal limits. No hydronephrosis. Stomach/Bowel: Stomach is within normal limits. Visualized bowel is unremarkable. Vascular/Lymphatic: No evidence abdominal aortic aneurysm. Atherosclerotic calcifications of the abdominal aorta and branch vessels. IVC filter. Mild upper abdominal lymphadenopathy, including a dominant 1.7 cm portacaval node (series 9/  image 37), new. Other: No abdominal ascites. Musculoskeletal: Degenerative changes of the visualized thoracolumbar spine. IMPRESSION: Numerous hepatic metastases in both lobes, progressed from the prior. Dominant lesion measures 9.6 cm centered in the anterior segment right hepatic lobe, previously 2.9 cm. No intrahepatic or extrahepatic ductal dilatation. Mild upper abdominal nodal metastases, new. Bilateral adrenal metastases, grossly unchanged. New 6 mm right lower lobe pulmonary nodule, suspicious for metastasis. Moderate left pleural effusion with associated pleural thickening/ enhancement, new, likely malignant. Additional ancillary findings as above. Please note that the pelvis was not imaged. Electronically Signed   By: Julian Hy M.D.   On: 02/04/2017 15:35   Dg Chest Port 1 View  Result Date: 02/14/2017 CLINICAL DATA:  Hypoxic.  History of breast and lung cancer. EXAM: PORTABLE CHEST 1 VIEW COMPARISON:  01/18/2017 FINDINGS: Stable appearance of the right jugular Port-A-Cath with the tip in the upper SVC region. New densities along the left chest base and along the lateral aspect of the left chest. Findings are suggestive for a loculated left pleural effusion which is at least moderate in size. Densities in left central lung probably represent compressive atelectasis but nonspecific. Slightly increased interstitial thickening compared to the previous examination. Heart size is grossly stable. IMPRESSION: Markedly increased densities in the left chest are most compatible with a moderate to large sized left pleural effusion which may be loculated. Central left lung densities are probably related to compressive atelectasis. Enlarged interstitial markings, particularly in the upper lungs. Findings could represent asymmetric edema versus atypical infection. Electronically Signed   By: Markus Daft M.D.   On: 02/14/2017 13:43   Dg Chest Port 1 View  Result Date: 01/18/2017 CLINICAL DATA:  Port-A-Cath  placement EXAM: PORTABLE CHEST 1 VIEW COMPARISON:  January 29, 2016 chest radiograph; PET-CT October 13, 2016 FINDINGS: Port-A-Cath tip is in the superior vena cava. No pneumothorax. There is mild atelectasis in the left base. Lungs elsewhere clear. Heart size and pulmonary vascularity  are normal. No adenopathy. There is aortic atherosclerosis. There is degenerative change in each shoulder. IMPRESSION: Port-A-Cath tip in superior vena cava. No pneumothorax. Mild left base atelectasis. No edema or consolidation. There is aortic atherosclerosis. Electronically Signed   By: Lowella Grip III M.D.   On: 01/18/2017 12:09   Dg C-arm 1-60 Min-no Report  Result Date: 02/11/2017 Fluoroscopy was utilized by the requesting physician.  No radiographic interpretation.   Dg C-arm 1-60 Min-no Report  Result Date: 01/18/2017 Fluoroscopy was utilized by the requesting physician.  No radiographic interpretation.   Mr Abdomen Mrcp Moise Boring Contast  Result Date: 02/09/2017 CLINICAL DATA:  Right breast cancer with hepatic metastatic disease. Elevated liver function studies. EXAM: MRI ABDOMEN WITHOUT AND WITH CONTRAST (INCLUDING MRCP) TECHNIQUE: Multiplanar multisequence MR imaging of the abdomen was performed both before and after the administration of intravenous contrast. Heavily T2-weighted images of the biliary and pancreatic ducts were obtained, and three-dimensional MRCP images were rendered by post processing. CONTRAST:  37mL MULTIHANCE GADOBENATE DIMEGLUMINE 529 MG/ML IV SOLN COMPARISON:  PET-CT 10/13/2016.  Abdominal CT 02/04/2017. FINDINGS: Lower chest: Complex left pleural effusion is again partially imaged with irregular pleural thickening and enhancement, likely malignant. No significant pleural fluid on the right. Grossly stable fluid collection laterally in the right breast without suspicious enhancement. Hepatobiliary: Again demonstrated is widespread hepatic metastatic disease with a large conglomerate mass  centrally in the liver measuring up to 11.9 x 10.0 x 13.1 cm. This partially encases and exerts mass effect on the hepatic and portal veins as well as the IVC. No tumor thrombus identified. There is probable extrinsic compression of the biliary system in the hepatic hilum. Mild intrahepatic biliary dilatation is present. The common bile duct is decompressed. The gallbladder is collapsed. Apart from the dominant central liver mass, there are multiple smaller lesions, similar to recent CT. Pancreas: Unremarkable. No pancreatic ductal dilatation or surrounding inflammatory changes. Spleen: Normal in size without focal abnormality. Adrenals/Urinary Tract: Small bilateral adrenal masses are grossly stable from previous studies and were not hypermetabolic on PET-CT, probably adenomas or hyperplasia. There are several cysts within the lower pole of the right kidney. The left kidney appears normal. No hydronephrosis. Stomach/Bowel: No evidence of bowel wall thickening, distention or surrounding inflammatory change. Vascular/Lymphatic: Stable adenopathy within the porta hepatis, potentially contributing to mass effect on the biliary system. No acute vascular findings. As above, there is extrinsic mass effect on the intrahepatic vasculature by the large central hepatic mass. Aortic and branch vessel atherosclerosis better demonstrated by CT. IVC filter noted. Other: No ascites or peritoneal nodularity. Musculoskeletal: No acute or significant osseous findings. IMPRESSION: 1. Large conglomerate central hepatic metastases likely contribute to extrinsic compression of the biliary system in the porta hepatis, contributing to the patient's elevated liver function studies. The common bile duct is decompressed. 2. Probable metastatic lymphadenopathy within the porta hepatis. 3. Probable malignant left pleural effusion. 4. Stable bilateral adrenal adenomas or hyperplasia. Electronically Signed   By: Richardean Sale M.D.   On:  02/09/2017 13:31   US Abdomen Limited Ruq  Result Date: 02/09/2017 CLINICAL DATA:  Initial evaluation for acute right upper quadrant and back pain. EXAM: ULTRASOUND ABDOMEN LIMITED RIGHT UPPER QUADRANT COMPARISON:  Prior CT from 02/04/2017. FINDINGS: Gallbladder: Gallbladder contracted. No internal cholelithiasis. Gallbladder wall measure 2.6 mm. No free pericholecystic fluid. No sonographic Holifield sign elicited on exam. Common bile duct: Diameter: 5 mm Liver: Multiple liver lesions present, largest of which present at the right hepatic lobe  measures 9.2 x 8.2 x 8.2 cm. Findings consistent with known metastatic disease. No made of a few small right renal cyst. IMPRESSION: 1. Contracted gallbladder without evidence for cholelithiasis, acute cholecystitis, or biliary dilatation. 2. Multiple hepatic lesions, consistent with known hepatic metastases. Electronically Signed   By: Jeannine Boga M.D.   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  # breast cancer- metastatic triple negative- currently on Taxol weekly. [Last treatment May 2018]. Poor tolerance/multiple cancellations-interruptions to chemotherapy. Given the significant burden of the disease [lung-pleural effusion/liver ] as noted on the CT scans ; and also given significant liver dysfunction bilirubin about 11- I did not think patient will tolerate chemotherapy well. I anticipate patient will have more side effects from the chemotherapy than the benefits.I strongly recommend hospice. Patient however not open to discussion regarding palliative care/hospice at this time.   # pain control- managed by palliative care team.   # poor insight/psychiatric illness- recommend evaluation with psychiatry- if this would help in the addition making process.  # discussed with palliative care nurse  Ms.Mahan; awaiting palliative care meeting with the patient's family tomorrow. Unfortunately I won't be able to attend the meeting as I would be at [facility] clinic. Poor Prognosis.   All questions were answered. The patient knows to call the clinic with any problems, questions or concerns.  Thank you Dr. Ether Griffins for allowing me to participate in the care of your pleasant patient. Please do not hesitate to contact me with questions or concerns in the interim.    Cammie Sickle, MD 02/14/2017 8:25 PM

## 2017-02-14 NOTE — Progress Notes (Signed)
[place] for warfarin Indication: DVT  Allergies  Allergen Reactions  . Penicillins Swelling    rash    Patient Measurements: Height: 5\' 5"  (165.1 cm) Weight: 161 lb 6.4 oz (73.2 kg) IBW/kg (Calculated) : 57 Heparin Dosing Weight: 72.1 kg  Vital Signs: Temp: 97.8 F (36.6 C) (06/25 0448) Temp Source: Axillary (06/24 2337) BP: 122/67 (06/25 0448) Pulse Rate: 91 (06/25 0448)  Labs:  Recent Labs  02/12/17 1239 02/13/17 0452 02/13/17 1916 02/14/17 0036 02/14/17 0513  HGB 13.4  --  10.7*  --  9.6*  HCT 40.5  --  32.6*  --  28.7*  PLT 155  --  165  --  156  LABPROT 14.7  --   --   --  20.5*  INR 1.14  --   --   --  1.73  CREATININE  --  0.61 0.75  --  0.70  TROPONINI  --   --  0.06* 0.05* 0.06*    Estimated Creatinine Clearance: 70.3 mL/min (by C-G formula based on SCr of 0.7 mg/dL).   Medical History: Past Medical History:  Diagnosis Date  . Anxiety   . Breast cancer ([place]) 08/2015   right breast, chemo  . Cancer of right female breast ([place])   . Coronary artery disease    a. NSTEMI cath 08/02/14: LM nl, pLAD 50%, mLCx 30%, mRCA 95% s/p PCI/DES, 2nd lesion 40%, EF 55%  . Diabetes mellitus without complication ([place])   . DVT (deep venous thrombosis) ([facility])    LEFT LEG   . HLD (hyperlipidemia)   . Hypertension   . Lung cancer ([place])   . Lung nodule   . MI (myocardial infarction) ([place])   . Seizure disorder ([place])   . Seizures ([place])   . Squamous cell lung cancer ([place]) 04/23/2015    Medications:  Scheduled:  . feeding supplement (ENSURE ENLIVE)  237 mL Oral BID BM  . insulin aspart  0-15 Units Subcutaneous TID WC  . insulin aspart  0-5 Units Subcutaneous QHS  . linagliptin  5 mg Oral Daily  . lisinopril  20 mg Oral Daily  . phenytoin  300 mg Oral Daily  . warfarin  3 mg Oral q1800    Assessment: Patient admitted for back pain and has a h/o metastatic breast CA, jaudice s/t liver metastasis. Patient was previously on  apixaban for DVT treatment but failed therapy, was then offered lovenox for DVT treatment, but patient was not amenable to this therapy.  Patient was then started on warfarin 3mg  daily on prior admit.  6/23  INR 1.14 (on prior admit) 6/24  (discharged, readmitted same day) no INR, No Dose Charted 6/25  INR 1.73   Goal of Therapy:  INR 2-3 Monitor platelets by anticoagulation protocol: Yes   Plan:  Will f/u INR daily. Will restart warfarin 3 mg daily and will closely monitor INR in the setting of liver metastasis and phenytoin therapy, which can induce warfarin metabolism.  Chinita Greenland PharmD Clinical Pharmacist 02/14/2017

## 2017-02-14 NOTE — Progress Notes (Signed)
Initial Nutrition Assessment  DOCUMENTATION CODES:   Non-severe (moderate) malnutrition in context of chronic illness  INTERVENTION:  Recommend liberalizing diet to Carbohydrate Modified. Will monitor patient's appetite/intake and CBGs. She may benefit from further liberalization to a regular diet pending intake and labs.  Encouraged adequate intake of calories and protein at meals. Discussed food items on the menu that are good sources of protein.  Continue Ensure Enlive po BID, each supplement provides 350 kcal and 20 grams of protein. Patient prefers chocolate.  Provide Magic cup BID with lunch and dinner, each supplement provides 290 kcal and 9 grams of protein. Patient prefers vanilla or chocolate.  NUTRITION DIAGNOSIS:   Malnutrition (Moderate) related to chronic illness (stg IV breast cancer) as evidenced by 9.1 percent weight loss over 4 months, moderate depletions of muscle mass, moderate depletion of body fat.  GOAL:   Patient will meet greater than or equal to 90% of their needs  MONITOR:   PO intake, Supplement acceptance, Labs, Weight trends, I & O's  REASON FOR ASSESSMENT:   Consult Assessment of nutrition requirement/status  ASSESSMENT:   66 year old female with PMHx of HTN, DVT in left leg, hx of MI, DM type 2, seizure disorder, CAD, HLD, anxiety, stage IV breast cancer on palliative chemotherapy with Taxol now admitted with back pain, generalized weakness, and gait instability. Recent admission 6/19-6/24 for jaundice with hyperbilirubinemia due to liver metastasis.   -Pending PMT consult.  Spoke with patient at bedside. She is known to this RD from recent admission last week. Last week she had reported her appetite had been good prior to that admission. She ate two meals per day and snacked. For breakfast at home she has fat back, eggs, hoecakes, and corn. For second meals she has fried chicken, corn, pinto beans, greens, and cornbread. However patient reports  this past week her appetite has not been good. She does not like the food here at the hospital and is having a hard time finding things she can eat. Also having some occasional nausea and abdominal pain. Reports she has not had a bowel movement in 4 days.  UBW was 220 lbs prior to cancer diagnosis over 1.5 years ago. Patient was 170.5 lbs on 09/30/2016 and lost 15.6 lbs (9.1% body weight) over 4 months, which is significant for time frame. Current weight likely falsely elevated and patient reports she does not believe she weighs 161 lbs right now.   Meal Completion: 0% of breakfast this morning per chart  Medications reviewed and include: Novolog 0-15 units TID, Novolog 0-5 units QHS, warfarin, NS @ 75 ml/hr.   Labs reviewed: CBG 154-159, Sodium 130, Chloride 100, BUN 49, elevated Troponin. HgbA1c 6.6 on 02/08/2017.  Nutrition-Focused physical exam completed. Findings are moderate fat depletion, moderate muscle depletion, and mild edema.   Diet Order:  Diet heart healthy/carb modified Room service appropriate? Yes; Fluid consistency: Thin  Skin:  Reviewed, no issues  Last BM:  PTA (02/10/2017 per chart)  Height:   Ht Readings from Last 1 Encounters:  02/13/17 5\' 5"  (1.651 m)    Weight:   Wt Readings from Last 1 Encounters:  02/13/17 161 lb 6.4 oz (73.2 kg)    Ideal Body Weight:  56.8 kg  BMI:  Body mass index is 26.86 kg/m.  Estimated Nutritional Needs:   Kcal:  9470-9628 (MSJ x 1.3-1.5)  Protein:  85-105 grams (1.2-1.5 grams/kg)  Fluid:  2 L/day (30 ml/kg)  EDUCATION NEEDS:   Education needs addressed  Willey Blade, MS, RD, LDN Pager: [phone] After Hours Pager: [phone]

## 2017-02-14 NOTE — Plan of Care (Signed)
Called Dr. Clayton Bibles - when we walked pt to [facility] she had significant amt of dark, rusty blood from rectal area.  Dr. Clayton Bibles ordered CT, hgb and GI consult.  Vit K also given.

## 2017-02-14 NOTE — Progress Notes (Signed)
Patient admitted for dehydration and hyponatremia. Patient takes Invokana 100 mg at home.  BG 130 - 160's  Considering patient is dehydrated and hyponatremic will hold off on invokana and treat BG w/ sliding scale and trajenta. Spoke and notified MD -- agrees with plan.  Tobie Lords, PharmD, BCPS Clinical Pharmacist 02/14/2017

## 2017-02-15 ENCOUNTER — Inpatient Hospital Stay: Payer: Medicare Other

## 2017-02-15 ENCOUNTER — Inpatient Hospital Stay
Admit: 2017-02-15 | Discharge: 2017-02-15 | Disposition: A | Discharge status: Home / Self Care | Payer: Medicare Other | Attending: Internal Medicine | Admitting: Internal Medicine

## 2017-02-15 DIAGNOSIS — K921 Melena: Secondary | ICD-10-CM

## 2017-02-15 DIAGNOSIS — Z7189 Other specified counseling: Secondary | ICD-10-CM

## 2017-02-15 DIAGNOSIS — Z515 Encounter for palliative care: Secondary | ICD-10-CM

## 2017-02-15 DIAGNOSIS — K922 Gastrointestinal hemorrhage, unspecified: Secondary | ICD-10-CM

## 2017-02-15 DIAGNOSIS — F0634 Mood disorder due to known physiological condition with mixed features: Secondary | ICD-10-CM

## 2017-02-15 LAB — TYPE AND SCREEN
ABO/RH(D): O POS
Antibody Screen: NEGATIVE

## 2017-02-15 LAB — BODY FLUID CELL COUNT WITH DIFFERENTIAL
EOS FL: 0 %
Lymphs, Fluid: 64 %
MONOCYTE-MACROPHAGE-SEROUS FLUID: 14 %
NEUTROPHIL FLUID: 22 %
Other Cells, Fluid: 0 %
WBC FLUID: 143 uL

## 2017-02-15 LAB — GLUCOSE, CAPILLARY
GLUCOSE-CAPILLARY: 133 mg/dL — AB (ref 65–99)
GLUCOSE-CAPILLARY: 170 mg/dL — AB (ref 65–99)
GLUCOSE-CAPILLARY: 147 mg/dL — AB (ref 65–99)
Glucose-Capillary: 112 mg/dL — ABNORMAL HIGH (ref 65–99)

## 2017-02-15 LAB — HEMOGLOBIN
Hemoglobin: 8.6 g/dL — ABNORMAL LOW (ref 12.0–16.0)
Hemoglobin: 10.1 g/dL — ABNORMAL LOW (ref 12.0–16.0)
Hemoglobin: 9.5 g/dL — ABNORMAL LOW (ref 12.0–16.0)

## 2017-02-15 LAB — ECHOCARDIOGRAM COMPLETE
Height: 65 in
Weight: 2582.4 oz

## 2017-02-15 LAB — ALBUMIN, PLEURAL OR PERITONEAL FLUID: Albumin, Fluid: 1.4 g/dL

## 2017-02-15 LAB — HIV ANTIBODY (ROUTINE TESTING W REFLEX): HIV Screen 4th Generation wRfx: NONREACTIVE

## 2017-02-15 LAB — GLUCOSE, PLEURAL OR PERITONEAL FLUID: Glucose, Fluid: 116 mg/dL

## 2017-02-15 LAB — PROTIME-INR
INR: 1.14
PROTHROMBIN TIME: 14.7 s (ref 11.4–15.2)

## 2017-02-15 LAB — LACTATE DEHYDROGENASE, PLEURAL OR PERITONEAL FLUID: LD, Fluid: 571 U/L — ABNORMAL HIGH (ref 3–23)

## 2017-02-15 LAB — SODIUM: SODIUM: 134 mmol/L — AB (ref 135–145)

## 2017-02-15 LAB — PROTEIN, PLEURAL OR PERITONEAL FLUID: Total protein, fluid: 3.1 g/dL

## 2017-02-15 LAB — MAGNESIUM: Magnesium: 2.1 mg/dL (ref 1.7–2.4)

## 2017-02-15 MED ORDER — LACTULOSE 10 GM/15ML PO SOLN: 30.0000 g | Freq: Two times a day (BID) | ORAL | Status: DC

## 2017-02-15 MED ORDER — LACTULOSE 10 GM/15ML PO SOLN
20.0000 g | Freq: Two times a day (BID) | ORAL | Status: DC
  Filled 2017-02-15 (×3): qty 30

## 2017-02-15 MED ORDER — MIRTAZAPINE 15 MG PO TBDP
7.5000 mg | ORAL_TABLET | Freq: Every day | ORAL | Status: DC
  Administered 2017-02-15 – 2017-02-16 (×2): 7.5 mg via ORAL
  Filled 2017-02-15 (×2): qty 0.5

## 2017-02-15 NOTE — Progress Notes (Signed)
[facility] NAME: Dawn Allen    MR#:  [id number]  DATE OF BIRTH:  Aug 14, 1951  SUBJECTIVE:  CHIEF COMPLAINT:   Chief Complaint  Patient presents with  . Back Pain   The patient is 66 year old female with history of metastatic breast cancer, who presents to the hospital with complaints of severe back pain. She has known metastatic breast cancer with radiation to liver and bone. She was recently seen by her primary oncologist [name], who recommended a bone scan, which was not performed.. Patient returns back due to significant pain. She explains that her pain is mostly in mid thoracic lower thoracic area and related to movements.The patient developed significant rectal bleeding yesterday, hemoglobin level drifted down from 13.4-8.6 today. Patient continues to pass some blood per rectum with clots. Denies abdominal pain except as above. The patient was seen by psychiatrist, who recommended  Remeron for sleep and appetite, however, did not feel that patient has significant depressive symptoms. The patient was seen by oncologist, who recommended palliative care consultation and hospice, he did not feel that patient is a candidate for chemotherapy at this time.  Review of Systems  Constitutional: Negative for chills, fever and weight loss.  HENT: Negative for congestion.   Eyes: Negative for blurred vision and double vision.  Respiratory: Negative for cough, sputum production, shortness of breath and wheezing.   Cardiovascular: Positive for chest pain. Negative for palpitations, orthopnea, leg swelling and PND.  Gastrointestinal: Positive for abdominal pain and blood in stool. Negative for constipation, diarrhea, nausea and vomiting.  Genitourinary: Negative for dysuria, frequency, hematuria and urgency.  Musculoskeletal: Positive for back pain. Negative for falls.  Neurological: Negative for dizziness, tremors, focal weakness  and headaches.  Endo/Heme/Allergies: Does not bruise/bleed easily.  Psychiatric/Behavioral: Negative for depression. The patient does not have insomnia.     VITAL SIGNS: Blood pressure 125/61, pulse (!) 107, temperature 98.6 F (37 C), temperature source Oral, resp. rate 20, height 5\' 5"  (1.651 m), weight 73.2 kg (161 lb 6.4 oz), SpO2 98 %.  PHYSICAL EXAMINATION:   GENERAL:  66 y.o.-year-old patient lying in the bed with no acute distress.  EYES: Pupils equal, round, reactive to light and accommodation. No scleral icterus. Extraocular muscles intact.  HEENT: Head atraumatic, normocephalic. Oropharynx and nasopharynx clear.  NECK:  Supple, no jugular venous distention. No thyroid enlargement, no tenderness. Palpation of thoracic spine reveals tenderness in lower thoracic area, lumbar area, but not in chest LUNGS: Normal breath sounds bilaterally, no wheezing, rales,rhonchi or crepitation. No use of accessory muscles of respiration.  CARDIOVASCULAR: S1, S2 normal. No murmurs, rubs, or gallops.  ABDOMEN: Soft, tender diffusely, mostly in the right side, liver edge is hard, tender to touch. Abdomen is nondistended. Bowel sounds present. No organomegaly or mass.  EXTREMITIES: No pedal edema, cyanosis, or clubbing.  NEUROLOGIC: Cranial nerves II through XII are intact. Muscle strength 5/5 in all extremities. Sensation intact. Gait not checked.  PSYCHIATRIC: The patient is alert and oriented x 3.  SKIN: No obvious rash, lesion, or ulcer.   ORDERS/RESULTS REVIEWED:   CBC  Recent Labs Lab 02/08/17 2159 02/12/17 1239 02/13/17 1916 02/14/17 0513 02/14/17 1832 02/15/17 0130 02/15/17 0918  WBC 8.3 8.2 8.8 8.6  --   --   --   HGB 13.2 13.4 10.7* 9.6* 8.9* 8.6* 10.1*  HCT 39.1 40.5 32.6* 28.7*  --   --   --   PLT 187 155  165 156  --   --   --   MCV 86.2 89.9 89.5 89.8  --   --   --   MCH 29.0 29.7 29.5 30.0  --   --   --   MCHC 33.6 33.1 32.9 33.4  --   --   --   RDW 17.7* 17.5* 17.4*  17.1*  --   --   --    ------------------------------------------------------------------------------------------------------------------  Chemistries   Recent Labs Lab 02/08/17 2159 02/12/17 0945 02/13/17 0452 02/13/17 1916 02/14/17 0036 02/14/17 0513 02/15/17 0918  NA 128* 128* 131* 131*  --  130* 134*  K 2.9* 4.8 3.8 5.0  --  4.5  --   CL 90* 94* 99* 95*  --  100*  --   CO2 25 26 25 25   --  24  --   GLUCOSE 142* 204* 166* 168*  --  146*  --   BUN 28* 21* 31* 41*  --  49*  --   CREATININE 0.73 0.53 0.61 0.75  --  0.70  --   CALCIUM 8.8* 8.9 8.3* 8.5*  --  8.2*  --   MG  --   --   --   --  2.0  --   --   AST 156* 169* 169* 169*  --  155*  --   ALT 124* 89* 80* 79*  --  72*  --   ALKPHOS 1,171* 995* 861* 828*  --  734*  --   BILITOT 12.0* 14.4* 12.5* 12.3*  --  11.0*  --    ------------------------------------------------------------------------------------------------------------------ estimated creatinine clearance is 70.3 mL/min (by C-G formula based on SCr of 0.7 mg/dL). ------------------------------------------------------------------------------------------------------------------ No results for input(s): TSH, T4TOTAL, T3FREE, THYROIDAB in the last 72 hours.  Invalid input(s): FREET3  Cardiac Enzymes  Recent Labs Lab 02/14/17 0036 02/14/17 0513 02/14/17 1119  TROPONINI 0.05* 0.06* 0.08*   ------------------------------------------------------------------------------------------------------------------ Invalid input(s): POCBNP ---------------------------------------------------------------------------------------------------------------  RADIOLOGY: Ct Chest Wo Contrast  Result Date: 02/14/2017 CLINICAL DATA:  Initial evaluation for pleural effusion. EXAM: CT CHEST WITHOUT CONTRAST TECHNIQUE: Multidetector CT imaging of the chest was performed following the standard protocol without IV contrast. COMPARISON:  Prior radiograph from earlier the same day. FINDINGS:  Cardiovascular: Intrathoracic aorta of normal caliber without acute abnormality. Moderate atheromatous plaque present throughout the visualized aorta. Partially visualized great vessels grossly within normal limits. Heart size normal. Diffuse 3 vessel coronary artery calcifications. Possible few scattered areas of irregular pleural thickening, most notable on the right (series 2, image 72). Mediastinum/Nodes: Visualized thyroid somewhat heterogeneous. 9 mm right cardiophrenic node (series 2, image 92). Additional nodular soft tissue thickening at the left cardiophrenic angle measuring up to 12 mm (series 2, image 97). Findings concerning for metastatic disease, and appear progressed from prior. No other definite pathologically enlarged mediastinal, hilar or, or axillary lymph nodes identified on this noncontrast examination. The esophagus normal. Small hiatal hernia. Lungs/Pleura: Tracheobronchial tree is patent centrally. There has been interval development of a large left pleural effusion. Irregular opacity along the left major fissure likely related to fluid tracking along the fissure. Few scattered areas of irregular pleural thickening present along the visualized anterior and medial pleural surface (series 2, image 65), concerning for metastatic disease. Associated left lower lobe atelectasis/collapse. There is a new 11 mm left lower lobe pulmonary nodule (series 3, image 43), concerning for metastatic disease. Additional scattered multifocal ground-glass opacities within the bilateral upper lobes, which may reflect post radiation changes or infiltrates. Small layering right  pleural effusion present as well. No pneumothorax. New 9 mm right lower lobe nodule (series 3, image 87). Upper Abdomen: Previously identified mass within the central aspect the liver not well visualized due to lack of IV contrast, however, there is suggestion that this lesion has increased in size and is now poorly defined and  infiltrative. Additionally, there is seen likely a few additional new lesions within the peripheral subcapsular left hepatic lobe measuring up to approximately 16 mm (series 2, image 116). Diffuse thickening of the bilateral adrenal glands grossly similar. Remainder of the visualized upper abdomen otherwise up grossly unremarkable. Musculoskeletal: 5.2 cm hypodense collection at the lateral right breast, likely postoperative seroma and/or hematoma, similar to previous. Diffuse anasarca. No acute osseous abnormality. No worrisome lytic or blastic osseous lesions. IMPRESSION: 1. Interval development of large left pleural effusion. Scattered areas of irregular/nodular pleural thickening about the left lung suggest possible pleural-based metastases, suggesting at this effusion is malignant in nature. Question additional irregular nodular thickening involving the pericardial surface as well. No significant pericardial effusion on this exam. 2. Associated left lower lobe atelectasis/consolidation. 3. New bilateral pulmonary nodules involving the bilateral lower lobes, concerning for metastatic disease. 4. New bilateral cardiophrenic nodes/soft tissue thickening, consistent with metastatic disease. 5. Poor visualization of previously identified hepatic metastasis involving the central aspect of the liver, although suspected to be worsened with increased size and poorly infiltrated nature. Probable additional new hepatic lesions within the subcapsular left hepatic lobe as above. 6. Small layering right pleural effusion. 7. Scattered patchy ground-glass opacity within the peripheral right upper lobe. Findings favored to reflect postradiation changes, although infiltrates could be considered in the correct clinical setting. 8. Persistent 5.2 cm hypodense collection within the peripheral right breast, consistent with a postoperative seroma or possibly hematoma, similar to previous. Electronically Signed   By: Jeannine Boga M.D.   On: 02/14/2017 19:42   Nm Bone Scan Whole Body  Result Date: 02/14/2017 CLINICAL DATA:  66 year old female with restaging of metastatic breast cancer. EXAM: NUCLEAR MEDICINE WHOLE BODY BONE SCAN TECHNIQUE: Whole body anterior and posterior images were obtained approximately 3 hours after intravenous injection of radiopharmaceutical. RADIOPHARMACEUTICALS:  22.28 mCi Technetium-20m MDP IV COMPARISON:  02/09/2017 MRI, 02/04/2017 CT and prior exams FINDINGS: Focal increased activity within the anterior right sixth and seventh ribs are compatible with subacute fractures when correlated with recent CT. No abnormalities identified to suggest bony metastatic disease. Increased activity within the knees and lower lumbar spine are compatible with degenerative changes. IMPRESSION: No evidence of bony metastatic disease. Subacute fractures within the anterior right sixth and seventh ribs. Degenerative changes within the knees and lower lumbar spine. Electronically Signed   By: Margarette Canada M.D.   On: 02/14/2017 13:55   Dg Chest Port 1 View  Result Date: 02/14/2017 CLINICAL DATA:  Hypoxic.  History of breast and lung cancer. EXAM: PORTABLE CHEST 1 VIEW COMPARISON:  01/18/2017 FINDINGS: Stable appearance of the right jugular Port-A-Cath with the tip in the upper SVC region. New densities along the left chest base and along the lateral aspect of the left chest. Findings are suggestive for a loculated left pleural effusion which is at least moderate in size. Densities in left central lung probably represent compressive atelectasis but nonspecific. Slightly increased interstitial thickening compared to the previous examination. Heart size is grossly stable. IMPRESSION: Markedly increased densities in the left chest are most compatible with a moderate to large sized left pleural effusion which may be loculated. Central left lung  densities are probably related to compressive atelectasis. Enlarged interstitial  markings, particularly in the upper lungs. Findings could represent asymmetric edema versus atypical infection. Electronically Signed   By: Markus Daft M.D.   On: 02/14/2017 13:43    EKG:  Orders placed or performed during the hospital encounter of 02/13/17  . 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 Acute posthemorrhagic anemia, follow hemoglobin level and transfuse patient as needed, type and screen #2. Gastrointestinal bleed of unclear etiology, continue Protonix intravenously twice daily, nothing by mouth, IV fluids, awaiting for gastroenterologist input #3. Intractable back pain , no metastasis to the bone per bone scan, , but broken ribs on the right side,appreciate oncologist input, continue pain medications, no significant help with Lidoderm patch or  K pad #4. Hyponatremia due to SIADH, initiated fluid restrictions, sodium level improved #5. Malnutrition, hypoalbuminemia, appreciate dietitian's  input, palliative care consultation is requested, pending, Remeron is initiated by psychiatrist to help with appetite, mood, sleep #6. Elevated transaminases due to infiltrative disease, seems to be stable, supportive therapy #7. Demand ischemia, hypotension, relative hypoxia, concerning for pulmonary embolism, however chest x-ray revealed left pleural effusion, the plan is for patient to undergo left paracentesis,  awaiting for palliative care input, CT of the chest revealed a likely malignant pleural effusion on the left.  #8. SVT, get magnesium level checked, suspected due to large pleural effusion, albuterol inhaler,schedule left thoracentesis for tomorrow morning, after discussion  with palliative care   Management plans discussed with the patient, family and they are in agreement.   DRUG ALLERGIES:  Allergies  Allergen Reactions  . Penicillins Swelling    rash    CODE STATUS:     Code Status Orders        Start     Ordered   02/13/17 2323  Full code  Continuous     02/13/17 2322    Code Status History    Date Active Date Inactive Code Status Order ID Comments User Context   02/09/2017  4:22 AM 02/13/2017  5:01 PM Full Code [id number]  Harrie Foreman, MD Inpatient   03/02/2015  3:42 PM 03/05/2015  5:21 PM Full Code [id number]  Idelle Crouch, MD Inpatient   02/27/2015  4:48 PM 02/28/2015 11:06 AM Full Code [id number]  Theodoro Grist, MD Inpatient      TOTAL TIME TAKING CARE OF THIS PATIENT: 40 minutes.    Theodoro Grist M.D on 02/15/2017 at 1:45 PM  Between 7am to 6pm - Pager - [phone]  After 6pm go to [email or web address] - password [email or web address] [facility]  [facility]  Office  [phone]  CC: Primary care physician; Lorelee Market, MD

## 2017-02-15 NOTE — Consult Note (Signed)
[name] MD, MRCP(U.K) [address]  [place]  [place], [place] [address]  Main: [phone]  Fax: [phone]  Consultation  Referring Provider:    Dr Ether Griffins  Primary Care Physician:  Lorelee Market, MD Primary Gastroenterologist:  None          Reason for Consultation:     GI bleed  Date of Admission:  02/13/2017 Date of Consultation:  02/15/2017         HPI:   Dawn Allen is a 66 y.o. 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 A few days back underwent an ERCP with stent placement with aim to relieve extrinsic compression of the bile ducts from liver mets. She was subsequently discharged and readmitted later in the evening for shortness of breath, pain  . Oncology has strongly recommended hospice but the patient is not keen .   I have been called for passing of some blood per rectum .   HB this morning was 10.1 grams  On admission was 10.7 grams.  She says she has been taking goody powder on a long term basis almost every day . She says she had a few tablespoons of black stools on Sunday and a large black bowel movement today on lactulose. No hematemesis.   Past Medical History:  Diagnosis Date  . Anxiety   . Breast cancer ([facility] [place] [facility]) 08/2015   right breast, chemo  . Cancer of right female breast ([facility])   . Coronary artery disease    a. NSTEMI cath 08/02/14: LM nl, pLAD 50%, mLCx 30%, mRCA 95% s/p PCI/DES, 2nd lesion 40%, EF 55%  . Diabetes mellitus without complication ([place] [facility])   . DVT (deep venous thrombosis) ([facility])    LEFT LEG   . HLD (hyperlipidemia)   . Hypertension   . Lung cancer ([place])   . Lung nodule   . MI (myocardial infarction) ([facility])   . Seizure disorder ([place])   . Seizures ([place])   . Squamous cell lung cancer ([facility]) 04/23/2015    Past Surgical History:  Procedure Laterality Date  . ABDOMINAL HYSTERECTOMY     PARTIAL   . AXILLARY LYMPH NODE BIOPSY Right 05/26/2016   Procedure: AXILLARY LYMPH NODE BIOPSY;  Surgeon: Hubbard Robinson, MD;  Location: [facility] ORS;  Service: General;  Laterality: Right;  . BREAST BIOPSY Right 09/15/2015   positve  . CARDIAC CATHETERIZATION  08/02/2014  . CORONARY ANGIOPLASTY  08/02/2014   drug eluting stent placement  . ENDOSCOPIC RETROGRADE CHOLANGIOPANCREATOGRAPHY (ERCP) WITH PROPOFOL N/A 02/11/2017   Procedure: ENDOSCOPIC RETROGRADE CHOLANGIOPANCREATOGRAPHY (ERCP) WITH PROPOFOL;  Surgeon: Lucilla Lame, MD;  Location: [facility] ENDOSCOPY;  Service: Endoscopy;  Laterality: N/A;  . IVC FILTER PLACEMENT ([place] [facility])    . LEG SURGERY Right    BLOOD CLOT  . MASTECTOMY, PARTIAL Right 05/26/2016   Procedure: MASTECTOMY PARTIAL;  Surgeon: Hubbard Robinson, MD;  Location: [facility] ORS;  Service: General;  Laterality: Right;  . PORT-A-CATH REMOVAL Left 01/18/2017   Procedure: REMOVAL PORT-A-CATH;  Surgeon: Vickie Epley, MD;  Location: [facility] ORS;  Service: General;  Laterality: Left;  . PORTACATH PLACEMENT Left 10/13/2015   Procedure: INSERTION PORT-A-CATH;  Surgeon: Hubbard Robinson, MD;  Location: [facility] ORS;  Service: General;  Laterality: Left;  . PORTACATH PLACEMENT Right 01/18/2017   Procedure: INSERTION PORT-A-CATH;  Surgeon: Vickie Epley, MD;  Location: [facility] ORS;  Service: General;  Laterality: Right;  . SENTINEL NODE BIOPSY Right 05/26/2016   Procedure:  SENTINEL NODE BIOPSY;  Surgeon: Hubbard Robinson, MD;  Location: [facility] ORS;  Service: General;  Laterality: Right;    Prior to Admission medications   Medication Sig Start Date End Date Taking? Authorizing Provider  diazepam (VALIUM) 5 MG tablet Take 1 tablet (5 mg total) by mouth 2 (two) times daily as needed. Reported on 12/16/2015 02/13/17  Yes Wieting, Richard, MD  empagliflozin (JARDIANCE) 25 MG TABS tablet Take 25 mg by mouth daily.   Yes [provider]  feeding supplement, ENSURE ENLIVE, (ENSURE ENLIVE) LIQD Take 237 mLs by mouth 2 (two) times daily between meals. 02/13/17  Yes Wieting, Richard, MD  HYDROcodone-acetaminophen  (NORCO/VICODIN) 5-325 MG tablet Take 1 tablet by mouth every 6 (six) hours as needed for moderate pain. 02/13/17  Yes Wieting, Richard, MD  lactulose (CHRONULAC) 10 GM/15ML solution Take 30 mLs (20 g total) by mouth daily as needed for mild constipation. 02/13/17  Yes Wieting, Richard, MD  lidocaine-prilocaine (EMLA) cream Apply 1 application topically as needed. Apply to port a cath site 1 hour before chemotherapy treatment. 12/14/16  Yes Cammie Sickle, MD  lisinopril (PRINIVIL,ZESTRIL) 20 MG tablet Take 1 tablet (20 mg total) by mouth daily. 02/13/17  Yes Wieting, Richard, MD  nitroGLYCERIN (NITROSTAT) 0.4 MG SL tablet Place 0.4 mg under the tongue as needed. 01/14/17  Yes [provider]  phenytoin (DILANTIN) 100 MG ER capsule Take 300 mg by mouth daily.    Yes [provider]  TRADJENTA 5 MG TABS tablet Take 5 mg by mouth daily. 01/19/17  Yes [provider]  VENTOLIN HFA 108 (90 Base) MCG/ACT inhaler Inhale 1 puff into the lungs every 6 (six) hours as needed. Reported on 01/20/2016 01/09/16  Yes [provider]  warfarin (COUMADIN) 3 MG tablet Take 1 tablet (3 mg total) by mouth daily at 6 PM. 02/13/17  Yes Loletha Grayer, MD    Family History  Problem Relation Age of Onset  . Heart attack Mother   . Breast cancer Sister 40       bilaterally breast cancer     Social History  Substance Use Topics  . Smoking status: Current Every Day Smoker    Packs/day: 1.00    Years: 45.00    Types: Cigarettes  . Smokeless tobacco: Never Used  . Alcohol use No    Allergies as of 02/13/2017 - Review Complete 02/13/2017  Allergen Reaction Noted  . Penicillins Swelling 08/06/2014    Review of Systems:    All systems reviewed and negative except where noted in HPI.   Physical Exam:  Vital signs in last 24 hours: Temp:  [98.5 F (36.9 C)-98.9 F (37.2 C)] 98.6 F (37 C) (06/26 1326) Pulse Rate:  [91-107] 105 (06/26 1606) Resp:  [18-24] 20 (06/26  1606) BP: (102-135)/(44-62) 126/44 (06/26 1606) SpO2:  [96 %-100 %] 100 % (06/26 1606) Last BM Date: 02/15/17 General:   Pleasant, cooperative in NAD Head:  Normocephalic and atraumatic. Eyes:   No icterus.   Conjunctiva pink. PERRLA. Ears:  Normal auditory acuity. Neck:  Supple; no masses or thyroidomegaly Lungs: Respirations even and unlabored. Lungs clear to auscultation bilaterally.   No wheezes, crackles, or rhonchi.  Heart:  Regular rate and rhythm;  Without murmur, clicks, rubs or gallops Abdomen:  Soft, nondistended, nontender. Normal bowel sounds. No appreciable masses or hepatomegaly.  No rebound or guarding.  Rectal:  Not performed. Msk:  Symmetrical without gross deformities.   Extremities:  Without edema, cyanosis or clubbing.  Neurologic:  Alert and oriented x3;  grossly normal neurologically. Psych:  Alert and cooperative. Normal affect.  LAB RESULTS:  Recent Labs  02/13/17 1916 02/14/17 0513 02/14/17 1832 02/15/17 0130 02/15/17 0918  WBC 8.8 8.6  --   --   --   HGB 10.7* 9.6* 8.9* 8.6* 10.1*  HCT 32.6* 28.7*  --   --   --   PLT 165 156  --   --   --    BMET  Recent Labs  02/13/17 0452 02/13/17 1916 02/14/17 0513 02/15/17 0918  NA 131* 131* 130* 134*  K 3.8 5.0 4.5  --   CL 99* 95* 100*  --   CO2 25 25 24   --   GLUCOSE 166* 168* 146*  --   BUN 31* 41* 49*  --   CREATININE 0.61 0.75 0.70  --   CALCIUM 8.3* 8.5* 8.2*  --    LFT  Recent Labs  02/14/17 0513  PROT 5.5*  ALBUMIN 1.8*  AST 155*  ALT 72*  ALKPHOS 734*  BILITOT 11.0*   PT/INR  Recent Labs  02/14/17 0513 02/15/17 0130  LABPROT 20.5* 14.7  INR 1.73 1.14    STUDIES: Ct Chest Wo Contrast  Result Date: 02/14/2017 CLINICAL DATA:  Initial evaluation for pleural effusion. EXAM: CT CHEST WITHOUT CONTRAST TECHNIQUE: Multidetector CT imaging of the chest was performed following the standard protocol without IV contrast. COMPARISON:  Prior radiograph from earlier the same day.  FINDINGS: Cardiovascular: Intrathoracic aorta of normal caliber without acute abnormality. Moderate atheromatous plaque present throughout the visualized aorta. Partially visualized great vessels grossly within normal limits. Heart size normal. Diffuse 3 vessel coronary artery calcifications. Possible few scattered areas of irregular pleural thickening, most notable on the right (series 2, image 72). Mediastinum/Nodes: Visualized thyroid somewhat heterogeneous. 9 mm right cardiophrenic node (series 2, image 92). Additional nodular soft tissue thickening at the left cardiophrenic angle measuring up to 12 mm (series 2, image 97). Findings concerning for metastatic disease, and appear progressed from prior. No other definite pathologically enlarged mediastinal, hilar or, or axillary lymph nodes identified on this noncontrast examination. The esophagus normal. Small hiatal hernia. Lungs/Pleura: Tracheobronchial tree is patent centrally. There has been interval development of a large left pleural effusion. Irregular opacity along the left major fissure likely related to fluid tracking along the fissure. Few scattered areas of irregular pleural thickening present along the visualized anterior and medial pleural surface (series 2, image 65), concerning for metastatic disease. Associated left lower lobe atelectasis/collapse. There is a new 11 mm left lower lobe pulmonary nodule (series 3, image 43), concerning for metastatic disease. Additional scattered multifocal ground-glass opacities within the bilateral upper lobes, which may reflect post radiation changes or infiltrates. Small layering right pleural effusion present as well. No pneumothorax. New 9 mm right lower lobe nodule (series 3, image 87). Upper Abdomen: Previously identified mass within the central aspect the liver not well visualized due to lack of IV contrast, however, there is suggestion that this lesion has increased in size and is now poorly defined and  infiltrative. Additionally, there is seen likely a few additional new lesions within the peripheral subcapsular left hepatic lobe measuring up to approximately 16 mm (series 2, image 116). Diffuse thickening of the bilateral adrenal glands grossly similar. Remainder of the visualized upper abdomen otherwise up grossly unremarkable. Musculoskeletal: 5.2 cm hypodense collection at the lateral right breast, likely postoperative seroma and/or hematoma, similar to previous. Diffuse anasarca. No acute osseous abnormality.  No worrisome lytic or blastic osseous lesions. IMPRESSION: 1. Interval development of large left pleural effusion. Scattered areas of irregular/nodular pleural thickening about the left lung suggest possible pleural-based metastases, suggesting at this effusion is malignant in nature. Question additional irregular nodular thickening involving the pericardial surface as well. No significant pericardial effusion on this exam. 2. Associated left lower lobe atelectasis/consolidation. 3. New bilateral pulmonary nodules involving the bilateral lower lobes, concerning for metastatic disease. 4. New bilateral cardiophrenic nodes/soft tissue thickening, consistent with metastatic disease. 5. Poor visualization of previously identified hepatic metastasis involving the central aspect of the liver, although suspected to be worsened with increased size and poorly infiltrated nature. Probable additional new hepatic lesions within the subcapsular left hepatic lobe as above. 6. Small layering right pleural effusion. 7. Scattered patchy ground-glass opacity within the peripheral right upper lobe. Findings favored to reflect postradiation changes, although infiltrates could be considered in the correct clinical setting. 8. Persistent 5.2 cm hypodense collection within the peripheral right breast, consistent with a postoperative seroma or possibly hematoma, similar to previous. Electronically Signed   By: Jeannine Boga M.D.   On: 02/14/2017 19:42   Nm Bone Scan Whole Body  Result Date: 02/14/2017 CLINICAL DATA:  66 year old female with restaging of metastatic breast cancer. EXAM: NUCLEAR MEDICINE WHOLE BODY BONE SCAN TECHNIQUE: Whole body anterior and posterior images were obtained approximately 3 hours after intravenous injection of radiopharmaceutical. RADIOPHARMACEUTICALS:  22.28 mCi Technetium-15m MDP IV COMPARISON:  02/09/2017 MRI, 02/04/2017 CT and prior exams FINDINGS: Focal increased activity within the anterior right sixth and seventh ribs are compatible with subacute fractures when correlated with recent CT. No abnormalities identified to suggest bony metastatic disease. Increased activity within the knees and lower lumbar spine are compatible with degenerative changes. IMPRESSION: No evidence of bony metastatic disease. Subacute fractures within the anterior right sixth and seventh ribs. Degenerative changes within the knees and lower lumbar spine. Electronically Signed   By: Margarette Canada M.D.   On: 02/14/2017 13:55   Dg Chest Port 1 View  Result Date: 02/14/2017 CLINICAL DATA:  Hypoxic.  History of breast and lung cancer. EXAM: PORTABLE CHEST 1 VIEW COMPARISON:  01/18/2017 FINDINGS: Stable appearance of the right jugular Port-A-Cath with the tip in the upper SVC region. New densities along the left chest base and along the lateral aspect of the left chest. Findings are suggestive for a loculated left pleural effusion which is at least moderate in size. Densities in left central lung probably represent compressive atelectasis but nonspecific. Slightly increased interstitial thickening compared to the previous examination. Heart size is grossly stable. IMPRESSION: Markedly increased densities in the left chest are most compatible with a moderate to large sized left pleural effusion which may be loculated. Central left lung densities are probably related to compressive atelectasis. Enlarged interstitial  markings, particularly in the upper lungs. Findings could represent asymmetric edema versus atypical infection. Electronically Signed   By: Markus Daft M.D.   On: 02/14/2017 13:43      Impression / Plan:   Dawn Allen is a 65 y.o. y/o female with metastatic breast cancer, liver mets admitted with shortness of breath , found to have a large left pleural effusion , associated left lower lobe atelectasis/consolidation , metastatic disease. H/o tarry black stool, stable Hb, daily use of goody powder and recent ERCP  Impression : Likely upper GI bleed from either a peptic ulcer from goody powder or bleeding from sphincterotomy site at ERCP worse due to effects of  Goody powder.   Plan  1. Hb stable - suggest watch and wait- would prefer to avoid endoscopy if possible due to pleural effusion , consolidation of the lungs , higher risk for respiratory issues during endoscopy . If actively bleeding suggest tagged RBC scan . Hb has been stable not required any transfusion. Continue PPI , stop any laxatives as she is  Having diarrhea .   2. I will follow up closely   Thank you for involving me in the care of this patient.      LOS: 2 days   [name], MD  02/15/2017, 4:12 PM

## 2017-02-15 NOTE — Procedures (Signed)
US guided left thoracentesis.  Removed 1 liter of dark amber fluid.  Minimal blood loss and no immediate complication.

## 2017-02-15 NOTE — Progress Notes (Signed)
*  PRELIMINARY RESULTS* Echocardiogram 2D Echocardiogram has been performed.  Dawn Allen 02/15/2017, 1:07 PM

## 2017-02-15 NOTE — Progress Notes (Addendum)
Daily Progress Note   Patient Name: Dawn Allen       Date: 02/15/2017 DOB: 10-10-1950  Age: 66 y.o. MRN#: [id number] Attending Physician: Theodoro Grist, MD Primary Care Physician: Lorelee Market, MD Admit Date: 02/13/2017  Reason for Consultation/Follow-up: Establishing goals of care, Non pain symptom management and Pain control  Subjective: Very protracted discussion with patient's two daughters and extended family members today. Patient granted permission prior to meeting for sharing all health information with family members present. Pragya had not told any of her very supportive family members about her cancer, except for one cousin. Upon hearing the news of her cancer and how advanced it was family expressed surpise and anguish. [place] has been estranged from her two daughters for most of their lives. Eschewing their existence at some points. However, [name] and Brayton Layman remained close to and were pretty much raised by Doyle's sisters. Samanthamarie has always been close to her sisters and cousins.  Last night, Alexiz and her daughters had meaningful and reparative conversations with Ap[place] expressing love for her daughters for the first time. Reviewed Luann's advanced cancers, poor prognosis and lack of treatment options. Discussed new GI bleed of unknown origin. Discussed continued aggressive medical interventions with investigation of origin of GI bleed vs transition to comfort care and focus on symptom management.  Discussed Hospice services and where Hospice support could take place. Patient has great desire to return home at this time and I don't think she would be eligible for residential hospice just yet. Therefore, discharging her home with Hospice support would be recommended. She is  likely to decline quickly and would eventually need transfer to residential hospice home- family desires this. Discussed code status. Reviewed poor likelihood of recovery, and burden of CPR and intubation. However, for now, family wants to keep patient full code status, stating if patient were to die, they would want her kept alive to give them time to be here. However, once she is admitted to Hospice and at home they would want a DNR order in place. Sabre states, "I'm not giving up"- when we explore this more she is able to reframe this as not giving up on living each day the best way that she can, but she is not focused on adding time, rather she is fighting for comfort and quality. She  wants to try and increase her strength a little more before going home. Nausea is better today. She wants to eat. She understands that eating may aggravate GI bleed, however, since focus is now on quality and food is quality to her will change her diet from NPO to choice. Her pain is controlled with the dilaudid. Will look at transitioning to po dilaudid tomorrow.  She is having therapeutic thoracentesis today. At first she declined this, but I discussed with her that this may improve her SOB and increase her mobility and thus she agreed.  Review of Systems  Constitutional: Positive for malaise/fatigue and weight loss.  Respiratory: Positive for shortness of breath. Negative for cough and hemoptysis.   Cardiovascular: Positive for chest pain.  Gastrointestinal: Negative for nausea.  Musculoskeletal: Positive for back pain.  Neurological: Positive for weakness.    Length of Stay: 2  Current Medications: Scheduled Meds:  . budesonide (PULMICORT) nebulizer solution  0.25 mg Nebulization BID  . feeding supplement (ENSURE ENLIVE)  237 mL Oral BID BM  . guaiFENesin  600 mg Oral BID  . insulin aspart  0-15 Units Subcutaneous TID WC  . insulin aspart  0-5 Units Subcutaneous QHS  . ipratropium-albuterol  3 mL  Nebulization Q6H  . lactulose  20 g Oral BID  . lidocaine  2 patch Transdermal Q24H  . linagliptin  5 mg Oral Daily  . lisinopril  20 mg Oral Daily  . mirtazapine  7.5 mg Oral QHS  . nicotine  21 mg Transdermal Daily  . pantoprazole (PROTONIX) IV  40 mg Intravenous Q12H  . phenytoin  300 mg Oral Daily    Continuous Infusions: . sodium chloride 75 mL/hr at 02/15/17 1339    PRN Meds: albuterol, bisacodyl, diazepam, gi cocktail, HYDROmorphone (DILAUDID) injection, lidocaine-prilocaine, magnesium citrate, nitroGLYCERIN, ondansetron **OR** ondansetron (ZOFRAN) IV, oxyCODONE, senna-docusate, zolpidem  Physical Exam  Constitutional: She is oriented to person, place, and time. She appears well-developed and well-nourished. No distress.  Eyes: Scleral icterus is present.  Cardiovascular:  tachycardic  Neurological: She is alert and oriented to person, place, and time.  Skin: Skin is warm and dry. She is not diaphoretic.  Psychiatric: She has a normal mood and affect. Her behavior is normal. Judgment and thought content normal.  Nursing note and vitals reviewed.           Vital Signs: BP 125/61 (BP Location: Left Arm)   Pulse (!) 107   Temp 98.6 F (37 C) (Oral)   Resp 20   Ht '5\' 5"'  (1.651 m)   Wt 73.2 kg (161 lb 6.4 oz)   SpO2 98%   BMI 26.86 kg/m  SpO2: SpO2: 98 % O2 Device: O2 Device: Not Delivered O2 Flow Rate:    Intake/output summary:  Intake/Output Summary (Last 24 hours) at 02/15/17 1514 Last data filed at 02/14/17 1826  Gross per 24 hour  Intake               50 ml  Output                0 ml  Net               50 ml   LBM: Last BM Date: 02/15/17 Baseline Weight: Weight: 72.1 kg (159 lb) Most recent weight: Weight: 73.2 kg (161 lb 6.4 oz)       Palliative Assessment/Data: PPS: 50%    Flowsheet Rows     Most Recent Value  Intake Tab  Referral  Department  Hospitalist  Unit at Time of Referral  Oncology Unit  Palliative Care Primary Diagnosis  Cancer    Date Notified  02/13/17  Palliative Care Type  New Palliative care  Reason for referral  Clarify Goals of Care  Date of Admission  02/13/17  # of days IP prior to Palliative referral  0  Clinical Assessment  Psychosocial & Spiritual Assessment  Palliative Care Outcomes      Patient Active Problem List   Diagnosis Date Noted  . 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 Metastatic breast cancer ([place])   . Intractable back pain 02/13/2017  . Liver metastases ([place])   . Carcinoma of overlapping sites of right breast in female, estrogen receptor negative ([place]) 02/17/2016  . Thyroid nodule 04/29/2015  . Squamous cell lung cancer ([place]) 04/23/2015  . Generalized weakness 03/02/2015  . Coronary artery disease   . Hypertension   . HLD (hyperlipidemia)   . Diabetic neuropathy ([place]) 05/31/2013    Palliative Care Assessment & Plan   Patient Profile: 66 y.o. female  with past medical history of metastatic breast cancer (to liver dx Jan 2017, was on palliative Taxol, last treatment was in May) and lung cancer (now with likely met pleural effusions, had radiation tx, no further treatments), anxiety, CAD, diabetes, DVT, HTN, MI, deizures admitted on 02/13/2017 with weakness and back pain. She was discharged on 6/23 for hyperammoniemia, jaundice and RUQ pain from progressing liver mets with biliary stent placement. Palliative medicine consulted for [place] and symptom management.   Assessment/Recommendations/Plan   Avoid aggressive diagnostics for GI bleed  Optimize medically then discharge home with Hospice- agree with blood transfusion  Allow for food choice for comfort and quality  Continue hydromorphone .19m IV q3hr prn, will transition to po tomorrow  Case management consult for home hospice  Agree with mirtazapine for symptom management  Agree with therapeutic thoracentesis   Continue full code status for now- will continue to address this  with patient and family  Goals of Care and Additional Recommendations:  Limitations on Scope of Treatment: Avoid Hospitalization, Initiate Comfort Feeding, No Artificial Feeding, No Chemotherapy, No Radiation and No Surgical Procedures  Code Status:  Full code  Prognosis:   < 3 months due progressing metastatic cancer, planned transition to Hospice upon discharge, decreased po intake new GI bleed  Discharge Planning:  Home with Hospice  Care plan was discussed with patient, her two daughters- [name] and MBrayton Layman and other extended family members.  Thank you for allowing the Palliative Medicine Team to assist in the care of this patient.   Time In: 1300 Time Out: 1500 Total Time 120 minutes Prolonged Time Billed yes      Greater than 50%  of this time was spent counseling and coordinating care related to the above assessment and plan.  KMariana Kaufman AGNP-C Palliative Medicine   Please contact Palliative Medicine Team phone at [phone] questions and concerns.

## 2017-02-15 NOTE — NC FL2 (Signed)
[place] LEVEL OF CARE SCREENING TOOL     IDENTIFICATION  Patient Name: Dawn Allen Birthdate: 1951-01-26 Sex: female Admission Date (Current Location): 02/13/2017  [place] and [place] Number:  Engineering geologist and Address:  [facility], [address] [place], [place] [address]      Provider Number: [id number]  Attending Physician Name and Address:  Theodoro Grist, MD  Relative Name and Phone Number:       Current Level of Care: Hospital Recommended Level of Care: [place] Prior Approval Number:    Date Approved/Denied:   PASRR Number:  [id number])  Discharge Plan: SNF    Current Diagnoses: Patient Active Problem List   Diagnosis Date Noted  . 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 Metastatic breast cancer ([place])   . Intractable back pain 02/13/2017  . Liver metastases ([place])   . Carcinoma of overlapping sites of right breast in female, estrogen receptor negative ([place]) 02/17/2016  . Thyroid nodule 04/29/2015  . Squamous cell lung cancer ([place]) 04/23/2015  . Generalized weakness 03/02/2015  . Coronary artery disease   . Hypertension   . HLD (hyperlipidemia)   . Diabetic neuropathy ([place]) 05/31/2013    Orientation RESPIRATION BLADDER Height & Weight     Self, Time, Situation, Place  Normal Continent Weight: 161 lb 6.4 oz (73.2 kg) Height:  5\' 5"  (165.1 cm)  BEHAVIORAL SYMPTOMS/MOOD NEUROLOGICAL BOWEL NUTRITION STATUS   (none) Convulsions/Seizures Continent Diet (Regualr Diet )  AMBULATORY STATUS COMMUNICATION OF NEEDS Skin   Extensive Assist Verbally Normal                       Personal Care Assistance Level of Assistance  Bathing, Feeding, Dressing Bathing Assistance: Limited assistance Feeding assistance: Independent Dressing Assistance: Limited assistance     Functional Limitations Info  Sight, Hearing, Speech Sight Info:  Adequate Hearing Info: Adequate Speech Info: Adequate    SPECIAL CARE FACTORS FREQUENCY  PT (By licensed PT), OT (By licensed OT)     PT Frequency:  (5) OT Frequency:  (5)            Contractures      Additional Factors Info  Code Status, Allergies, Insulin Sliding Scale Code Status Info:  (Full Code. ) Allergies Info:  (Penicillins)   Insulin Sliding Scale Info:  (NovoLog Insulin Injections. )       Current Medications (02/15/2017):  This is the current hospital active medication list Current Facility-Administered Medications  Medication Dose Route Frequency Provider Last Rate Last Dose  . 0.9 %  sodium chloride infusion   Intravenous Continuous Hugelmeyer, Alexis, DO 75 mL/hr at 02/15/17 1339    . albuterol (PROVENTIL) (2.5 MG/3ML) 0.083% nebulizer solution 2.5 mg  2.5 mg Nebulization Q6H PRN Hugelmeyer, Alexis, DO      . bisacodyl (DULCOLAX) EC tablet 5 mg  5 mg Oral Daily PRN Hugelmeyer, Alexis, DO      . budesonide (PULMICORT) nebulizer solution 0.25 mg  0.25 mg Nebulization BID Theodoro Grist, MD   0.25 mg at 02/15/17 0830  . diazepam (VALIUM) tablet 5 mg  5 mg Oral BID PRN Hugelmeyer, Alexis, DO      . feeding supplement (ENSURE ENLIVE) (ENSURE ENLIVE) liquid 237 mL  237 mL Oral BID BM Ether Griffins, Rima, MD   237 mL at 02/14/17 1505  . gi cocktail (Maalox,Lidocaine,Donnatal)  30 mL Oral TID PRN Hugelmeyer, Alexis, DO  30 mL at 02/13/17 2230  . guaiFENesin (MUCINEX) 12 hr tablet 600 mg  600 mg Oral BID Theodoro Grist, MD   600 mg at 02/14/17 1032  . HYDROmorphone (DILAUDID) injection 0.5 mg  0.5 mg Intravenous Q3H PRN Earlie Counts, NP   0.5 mg at 02/15/17 1032  . insulin aspart (novoLOG) injection 0-15 Units  0-15 Units Subcutaneous TID WC Hugelmeyer, Alexis, DO   2 Units at 02/15/17 0847  . insulin aspart (novoLOG) injection 0-5 Units  0-5 Units Subcutaneous QHS Hugelmeyer, Alexis, DO      . ipratropium-albuterol (DUONEB) 0.5-2.5 (3) MG/3ML nebulizer solution 3 mL  3 mL  Nebulization Q6H [place], Rima, MD   3 mL at 02/15/17 1311  . lactulose (CHRONULAC) 10 GM/15ML solution 20 g  20 g Oral BID Theodoro Grist, MD      . lidocaine (LIDODERM) 5 % 2 patch  2 patch Transdermal Q24H Theodoro Grist, MD   2 patch at 02/14/17 1032  . lidocaine-prilocaine (EMLA) cream 1 application  1 application Topical PRN Hugelmeyer, Alexis, DO      . linagliptin (TRADJENTA) tablet 5 mg  5 mg Oral Daily Hugelmeyer, Alexis, DO   5 mg at 02/14/17 1032  . lisinopril (PRINIVIL,ZESTRIL) tablet 20 mg  20 mg Oral Daily Hugelmeyer, Alexis, DO      . magnesium citrate solution 1 Bottle  1 Bottle Oral Once PRN Hugelmeyer, Alexis, DO      . mirtazapine (REMERON SOL-TAB) disintegrating tablet 7.5 mg  7.5 mg Oral QHS Hernandez-Gonzalez, Seth Bake, MD      . nicotine (NICODERM CQ - dosed in mg/24 hours) patch 21 mg  21 mg Transdermal Daily Theodoro Grist, MD   21 mg at 02/15/17 1032  . nitroGLYCERIN (NITROSTAT) SL tablet 0.4 mg  0.4 mg Sublingual Q5 min PRN Hugelmeyer, Alexis, DO      . ondansetron (ZOFRAN) tablet 4 mg  4 mg Oral Q6H PRN Hugelmeyer, Alexis, DO       Or  . ondansetron (ZOFRAN) injection 4 mg  4 mg Intravenous Q6H PRN Hugelmeyer, Alexis, DO   4 mg at 02/14/17 1104  . oxyCODONE (Oxy IR/ROXICODONE) immediate release tablet 5 mg  5 mg Oral Q4H PRN Theodoro Grist, MD      . pantoprazole (PROTONIX) injection 40 mg  40 mg Intravenous Q12H Theodoro Grist, MD   40 mg at 02/14/17 2201  . phenytoin (DILANTIN) ER capsule 300 mg  300 mg Oral Daily Hugelmeyer, Alexis, DO   300 mg at 02/14/17 1032  . senna-docusate (Senokot-S) tablet 1 tablet  1 tablet Oral QHS PRN Hugelmeyer, Alexis, DO      . zolpidem (AMBIEN) tablet 5 mg  5 mg Oral QHS PRN Hugelmeyer, Alexis, DO       Facility-Administered Medications Ordered in Other Encounters  Medication Dose Route Frequency Provider Last Rate Last Dose  . albuterol (PROVENTIL) (2.5 MG/3ML) 0.083% nebulizer solution 2.5 mg  2.5 mg Nebulization Once Nestor Lewandowsky, MD      . sodium chloride flush (NS) 0.9 % injection 10 mL  10 mL Intravenous PRN Cammie Sickle, MD   10 mL at 12/01/15 0850     Discharge Medications: Please see discharge summary for a list of discharge medications.  Relevant Imaging Results:  Relevant Lab Results:   Additional Information  (SSN: [id number])  Nonnie Pickney, [name], LCSW

## 2017-02-15 NOTE — Consult Note (Signed)
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 Metastatic breast cancer ([place]) [C50.919]   . Intractable back pain [M54.9] 02/13/2017  . Liver metastases ([place]) [C78.7]   . Carcinoma of overlapping sites of right breast in female, estrogen receptor negative ([place]) [C50.811, Z17.1] 02/17/2016  . Thyroid nodule [E04.1] 04/29/2015  . Squamous cell lung cancer ([place]) [C34.90] 04/23/2015  . Generalized weakness [R53.1] 03/02/2015  . Coronary artery disease [I25.10]   . Hypertension [I10]   . HLD (hyperlipidemia) [E78.5]   . Diabetic neuropathy ([place]) [E11.40] 05/31/2013    Total Time spent with patient: 1 hour  Subjective:   Dawn Allen is a 66 y.o. female patient admitted with intractable pain  HPI:    66 year old single African-American female with metastatic breast cancer. The patient was admitted to the medical for intractable pain. Metastases to bone is suspected as the cause.  Per oncology notes patient has a poor prognosis. Family meeting has been scheduled today with palliative care.  Psychiatry was consulted for anxiety.  Patient was interviewed. She was alert and oriented in person place time and situation. She denies having any depressive symptoms. She feels optimistic and  states that she will continue to push through with treatment. Says that she is not ready to give up.  Patient denies problems with appetite, concentration or mood. She does complain of insomnia at times. She denies suicidality or passive suicidal ideation. She denies homicidality or auditory or visual hallucinations. During examination there was no  evidence of mania, hypomania or delusional thinking.  Trauma history she denies  Substance abuse history she denies    Past Psychiatric History: Denies  Risk to Self: Is patient at risk for suicide?: No Risk to Others:  no  Past Medical History:  Past Medical History:  Diagnosis Date  . Anxiety   . Breast cancer ([place]) 08/2015   right breast, chemo  . Cancer of right female breast ([place])   . Coronary artery disease    a. NSTEMI cath 08/02/14: LM nl, pLAD 50%, mLCx 30%, mRCA 95% s/p PCI/DES, 2nd lesion 40%, EF 55%  . Diabetes mellitus without complication ([place])   . DVT (deep venous thrombosis) ([facility])    LEFT LEG   . HLD (hyperlipidemia)   . Hypertension   . Lung cancer ([place])   . Lung nodule   . MI (myocardial infarction) ([place])   . Seizure disorder ([place])   . Seizures ([place])   . Squamous cell lung cancer ([place]) 04/23/2015    Past Surgical History:  Procedure Laterality Date  . ABDOMINAL HYSTERECTOMY     PARTIAL   . AXILLARY LYMPH NODE BIOPSY Right 05/26/2016   Procedure: AXILLARY LYMPH NODE BIOPSY;  Surgeon: Hubbard Robinson, MD;  Location: [facility] ORS;  Service: General;  Laterality: Right;  . BREAST BIOPSY Right 09/15/2015   positve  . CARDIAC CATHETERIZATION  08/02/2014  . CORONARY ANGIOPLASTY  08/02/2014   drug eluting stent placement  . ENDOSCOPIC RETROGRADE CHOLANGIOPANCREATOGRAPHY (ERCP) WITH PROPOFOL N/A 02/11/2017   Procedure: ENDOSCOPIC RETROGRADE CHOLANGIOPANCREATOGRAPHY (ERCP) WITH PROPOFOL;  Surgeon: Lucilla Lame, MD;  Location: [facility] ENDOSCOPY;  Service: Endoscopy;  Laterality: N/A;  . IVC FILTER PLACEMENT ([place] HX)    .  LEG SURGERY Right    BLOOD CLOT  . MASTECTOMY, PARTIAL Right 05/26/2016   Procedure: MASTECTOMY PARTIAL;  Surgeon: Hubbard Robinson, MD;  Location: [facility] ORS;  Service: General;  Laterality: Right;  . PORT-A-CATH REMOVAL Left 01/18/2017   Procedure: REMOVAL PORT-A-CATH;  Surgeon: Vickie Epley, MD;  Location: [facility] ORS;  Service: General;   Laterality: Left;  . PORTACATH PLACEMENT Left 10/13/2015   Procedure: INSERTION PORT-A-CATH;  Surgeon: Hubbard Robinson, MD;  Location: [facility] ORS;  Service: General;  Laterality: Left;  . PORTACATH PLACEMENT Right 01/18/2017   Procedure: INSERTION PORT-A-CATH;  Surgeon: Vickie Epley, MD;  Location: [facility] ORS;  Service: General;  Laterality: Right;  . SENTINEL NODE BIOPSY Right 05/26/2016   Procedure: SENTINEL NODE BIOPSY;  Surgeon: Hubbard Robinson, MD;  Location: [facility] ORS;  Service: General;  Laterality: Right;   Family History:  Family History  Problem Relation Age of Onset  . Heart attack Mother   . Breast cancer Sister 68       bilaterally breast cancer   Family Psychiatric  History: Denies  Social History:  History  Alcohol Use No     History  Drug Use No    Social History   Social History  . Marital status: Single    Spouse name: N/A  . Number of children: N/A  . Years of education: N/A   Social History Main Topics  . Smoking status: Current Every Day Smoker    Packs/day: 1.00    Years: 45.00    Types: Cigarettes  . Smokeless tobacco: Never Used  . Alcohol use No  . Drug use: No  . Sexual activity: Yes    Partners: Male   Other Topics Concern  . None   Social History Narrative  . None   Additional Social History:    Allergies:   Allergies  Allergen Reactions  . Penicillins Swelling    rash    Labs:  Results for orders placed or performed during the hospital encounter of 02/13/17 (from the past 48 hour(s))  CBC     Status: Abnormal   Collection Time: 02/13/17  7:16 PM  Result Value Ref Range   WBC 8.8 3.6 - 11.0 K/uL   RBC 3.64 (L) 3.80 - 5.20 MIL/uL   Hemoglobin 10.7 (L) 12.0 - 16.0 g/dL   HCT 32.6 (L) 35.0 - 47.0 %   MCV 89.5 80.0 - 100.0 fL   MCH 29.5 26.0 - 34.0 pg   MCHC 32.9 32.0 - 36.0 g/dL   RDW 17.4 (H) 11.5 - 14.5 %   Platelets 165 150 - 440 K/uL  Comprehensive metabolic panel     Status: Abnormal   Collection Time: 02/13/17   7:16 PM  Result Value Ref Range   Sodium 131 (L) 135 - 145 mmol/L   Potassium 5.0 3.5 - 5.1 mmol/L   Chloride 95 (L) 101 - 111 mmol/L   CO2 25 22 - 32 mmol/L   Glucose, Bld 168 (H) 65 - 99 mg/dL   BUN 41 (H) 6 - 20 mg/dL   Creatinine, Ser 0.75 0.44 - 1.00 mg/dL   Calcium 8.5 (L) 8.9 - 10.3 mg/dL   Total Protein 6.1 (L) 6.5 - 8.1 g/dL   Albumin 2.0 (L) 3.5 - 5.0 g/dL   AST 169 (H) 15 - 41 U/L   ALT 79 (H) 14 - 54 U/L   Alkaline Phosphatase 828 (H) 38 - 126 U/L   Total Bilirubin 12.3 (H) 0.3 -  1.2 mg/dL   GFR calc non Af Amer >60 >60 mL/min   GFR calc Af Amer >60 >60 mL/min    Comment: (NOTE) The eGFR has been calculated using the CKD EPI equation. This calculation has not been validated in all clinical situations. eGFR's persistently <60 mL/min signify possible Chronic Kidney Disease.    Anion gap 11 5 - 15  Troponin I     Status: Abnormal   Collection Time: 02/13/17  7:16 PM  Result Value Ref Range   Troponin I 0.06 (HH) <0.03 ng/mL    Comment: CRITICAL RESULT CALLED TO, READ BACK BY AND VERIFIED WITH SUSAN NEAL ON 02/13/17 AT 1956 [place]   Glucose, capillary     Status: Abnormal   Collection Time: 02/13/17 11:48 PM  Result Value Ref Range   Glucose-Capillary 159 (H) 65 - 99 mg/dL  HIV antibody (Routine Testing)     Status: None   Collection Time: 02/14/17 12:36 AM  Result Value Ref Range   HIV Screen 4th Generation wRfx Non Reactive Non Reactive    Comment: (NOTE) Performed At: [facility] [place], [place] [id number] Lindon Romp MD QA:[id number]   Magnesium     Status: None   Collection Time: 02/14/17 12:36 AM  Result Value Ref Range   Magnesium 2.0 1.7 - 2.4 mg/dL  Phosphorus     Status: None   Collection Time: 02/14/17 12:36 AM  Result Value Ref Range   Phosphorus 3.1 2.5 - 4.6 mg/dL    Comment: ICTERUS AT THIS LEVEL MAY AFFECT RESULT  Troponin I     Status: Abnormal   Collection Time: 02/14/17 12:36 AM  Result Value Ref Range    Troponin I 0.05 (HH) <0.03 ng/mL    Comment: CRITICAL VALUE NOTED. VALUE IS CONSISTENT WITH PREVIOUSLY REPORTED/CALLED VALUE RWW   CBC     Status: Abnormal   Collection Time: 02/14/17  5:13 AM  Result Value Ref Range   WBC 8.6 3.6 - 11.0 K/uL   RBC 3.19 (L) 3.80 - 5.20 MIL/uL   Hemoglobin 9.6 (L) 12.0 - 16.0 g/dL   HCT 28.7 (L) 35.0 - 47.0 %   MCV 89.8 80.0 - 100.0 fL   MCH 30.0 26.0 - 34.0 pg   MCHC 33.4 32.0 - 36.0 g/dL   RDW 17.1 (H) 11.5 - 14.5 %   Platelets 156 150 - 440 K/uL  Troponin I     Status: Abnormal   Collection Time: 02/14/17  5:13 AM  Result Value Ref Range   Troponin I 0.06 (HH) <0.03 ng/mL    Comment: CRITICAL VALUE NOTED. VALUE IS CONSISTENT WITH PREVIOUSLY REPORTED/CALLED VALUE  SDR   Comprehensive metabolic panel     Status: Abnormal   Collection Time: 02/14/17  5:13 AM  Result Value Ref Range   Sodium 130 (L) 135 - 145 mmol/L   Potassium 4.5 3.5 - 5.1 mmol/L   Chloride 100 (L) 101 - 111 mmol/L   CO2 24 22 - 32 mmol/L   Glucose, Bld 146 (H) 65 - 99 mg/dL   BUN 49 (H) 6 - 20 mg/dL   Creatinine, Ser 0.70 0.44 - 1.00 mg/dL   Calcium 8.2 (L) 8.9 - 10.3 mg/dL   Total Protein 5.5 (L) 6.5 - 8.1 g/dL   Albumin 1.8 (L) 3.5 - 5.0 g/dL   AST 155 (H) 15 - 41 U/L   ALT 72 (H) 14 - 54 U/L   Alkaline Phosphatase 734 (H) 38 - 126 U/L  Total Bilirubin 11.0 (H) 0.3 - 1.2 mg/dL   GFR calc non Af Amer >60 >60 mL/min   GFR calc Af Amer >60 >60 mL/min    Comment: (NOTE) The eGFR has been calculated using the CKD EPI equation. This calculation has not been validated in all clinical situations. eGFR's persistently <60 mL/min signify possible Chronic Kidney Disease.    Anion gap 6 5 - 15  Ammonia     Status: Abnormal   Collection Time: 02/14/17  5:13 AM  Result Value Ref Range   Ammonia 74 (H) 9 - 35 umol/L    Comment: ICTERUS AT THIS LEVEL MAY AFFECT RESULT  Protime-INR     Status: Abnormal   Collection Time: 02/14/17  5:13 AM  Result Value Ref Range    Prothrombin Time 20.5 (H) 11.4 - 15.2 seconds   INR 1.73   Glucose, capillary     Status: Abnormal   Collection Time: 02/14/17  7:27 AM  Result Value Ref Range   Glucose-Capillary 154 (H) 65 - 99 mg/dL  Osmolality, urine     Status: None   Collection Time: 02/14/17  8:20 AM  Result Value Ref Range   Osmolality, Ur 530 300 - 900 mOsm/kg  Troponin I     Status: Abnormal   Collection Time: 02/14/17 11:19 AM  Result Value Ref Range   Troponin I 0.08 (HH) <0.03 ng/mL    Comment: CRITICAL VALUE NOTED. VALUE IS CONSISTENT WITH PREVIOUSLY REPORTED/CALLED VALUE DAS  Glucose, capillary     Status: Abnormal   Collection Time: 02/14/17 11:57 AM  Result Value Ref Range   Glucose-Capillary 154 (H) 65 - 99 mg/dL  Glucose, capillary     Status: Abnormal   Collection Time: 02/14/17  6:10 PM  Result Value Ref Range   Glucose-Capillary 162 (H) 65 - 99 mg/dL  Hemoglobin     Status: Abnormal   Collection Time: 02/14/17  6:32 PM  Result Value Ref Range   Hemoglobin 8.9 (L) 12.0 - 16.0 g/dL  Glucose, capillary     Status: Abnormal   Collection Time: 02/14/17  8:32 PM  Result Value Ref Range   Glucose-Capillary 128 (H) 65 - 99 mg/dL  Protime-INR     Status: None   Collection Time: 02/15/17  1:30 AM  Result Value Ref Range   Prothrombin Time 14.7 11.4 - 15.2 seconds   INR 1.14   Hemoglobin     Status: Abnormal   Collection Time: 02/15/17  1:30 AM  Result Value Ref Range   Hemoglobin 8.6 (L) 12.0 - 16.0 g/dL  Glucose, capillary     Status: Abnormal   Collection Time: 02/15/17  7:36 AM  Result Value Ref Range   Glucose-Capillary 133 (H) 65 - 99 mg/dL  Hemoglobin     Status: Abnormal   Collection Time: 02/15/17  9:18 AM  Result Value Ref Range   Hemoglobin 10.1 (L) 12.0 - 16.0 g/dL  Sodium     Status: Abnormal   Collection Time: 02/15/17  9:18 AM  Result Value Ref Range   Sodium 134 (L) 135 - 145 mmol/L  Type and screen Hold 2 units now and stay ahead 2 units at all times     Status: None    Collection Time: 02/15/17  9:18 AM  Result Value Ref Range   ABO/RH(D) O POS    Antibody Screen NEG    Sample Expiration 02/18/2017   Glucose, capillary     Status: Abnormal   Collection Time: 02/15/17  12:04 PM  Result Value Ref Range   Glucose-Capillary 112 (H) 65 - 99 mg/dL    Current Facility-Administered Medications  Medication Dose Route Frequency Provider Last Rate Last Dose  . 0.9 %  sodium chloride infusion   Intravenous Continuous Hugelmeyer, Alexis, DO 75 mL/hr at 02/15/17 0103    . albuterol (PROVENTIL) (2.5 MG/3ML) 0.083% nebulizer solution 2.5 mg  2.5 mg Nebulization Q6H PRN Hugelmeyer, Alexis, DO      . bisacodyl (DULCOLAX) EC tablet 5 mg  5 mg Oral Daily PRN Hugelmeyer, Alexis, DO      . budesonide (PULMICORT) nebulizer solution 0.25 mg  0.25 mg Nebulization BID Theodoro Grist, MD   0.25 mg at 02/15/17 0830  . diazepam (VALIUM) tablet 5 mg  5 mg Oral BID PRN Hugelmeyer, Alexis, DO      . feeding supplement (ENSURE ENLIVE) (ENSURE ENLIVE) liquid 237 mL  237 mL Oral BID BM Ether Griffins, Rima, MD   237 mL at 02/14/17 1505  . gi cocktail (Maalox,Lidocaine,Donnatal)  30 mL Oral TID PRN Hugelmeyer, Alexis, DO   30 mL at 02/13/17 2230  . guaiFENesin (MUCINEX) 12 hr tablet 600 mg  600 mg Oral BID Theodoro Grist, MD   600 mg at 02/14/17 1032  . HYDROmorphone (DILAUDID) injection 0.5 mg  0.5 mg Intravenous Q3H PRN Earlie Counts, NP   0.5 mg at 02/15/17 1032  . insulin aspart (novoLOG) injection 0-15 Units  0-15 Units Subcutaneous TID WC Hugelmeyer, Alexis, DO   2 Units at 02/15/17 0847  . insulin aspart (novoLOG) injection 0-5 Units  0-5 Units Subcutaneous QHS Hugelmeyer, Alexis, DO      . ipratropium-albuterol (DUONEB) 0.5-2.5 (3) MG/3ML nebulizer solution 3 mL  3 mL Nebulization Q6H [place], Rima, MD   3 mL at 02/15/17 0830  . lactulose (CHRONULAC) 10 GM/15ML solution 20 g  20 g Oral Daily PRN Hugelmeyer, Alexis, DO      . lidocaine (LIDODERM) 5 % 2 patch  2 patch Transdermal Q24H  Theodoro Grist, MD   2 patch at 02/14/17 1032  . lidocaine-prilocaine (EMLA) cream 1 application  1 application Topical PRN Hugelmeyer, Alexis, DO      . linagliptin (TRADJENTA) tablet 5 mg  5 mg Oral Daily Hugelmeyer, Alexis, DO   5 mg at 02/14/17 1032  . lisinopril (PRINIVIL,ZESTRIL) tablet 20 mg  20 mg Oral Daily Hugelmeyer, Alexis, DO      . magnesium citrate solution 1 Bottle  1 Bottle Oral Once PRN Hugelmeyer, Alexis, DO      . nicotine (NICODERM CQ - dosed in mg/24 hours) patch 21 mg  21 mg Transdermal Daily Theodoro Grist, MD   21 mg at 02/15/17 1032  . nitroGLYCERIN (NITROSTAT) SL tablet 0.4 mg  0.4 mg Sublingual Q5 min PRN Hugelmeyer, Alexis, DO      . ondansetron (ZOFRAN) tablet 4 mg  4 mg Oral Q6H PRN Hugelmeyer, Alexis, DO       Or  . ondansetron (ZOFRAN) injection 4 mg  4 mg Intravenous Q6H PRN Hugelmeyer, Alexis, DO   4 mg at 02/14/17 1104  . oxyCODONE (Oxy IR/ROXICODONE) immediate release tablet 5 mg  5 mg Oral Q4H PRN Theodoro Grist, MD      . pantoprazole (PROTONIX) injection 40 mg  40 mg Intravenous Q12H Theodoro Grist, MD   40 mg at 02/14/17 2201  . phenytoin (DILANTIN) ER capsule 300 mg  300 mg Oral Daily Hugelmeyer, Alexis, DO   300 mg at 02/14/17 1032  .  senna-docusate (Senokot-S) tablet 1 tablet  1 tablet Oral QHS PRN Hugelmeyer, Alexis, DO      . zolpidem (AMBIEN) tablet 5 mg  5 mg Oral QHS PRN Hugelmeyer, Alexis, DO       Facility-Administered Medications Ordered in Other Encounters  Medication Dose Route Frequency Provider Last Rate Last Dose  . albuterol (PROVENTIL) (2.5 MG/3ML) 0.083% nebulizer solution 2.5 mg  2.5 mg Nebulization Once Nestor Lewandowsky, MD      . sodium chloride flush (NS) 0.9 % injection 10 mL  10 mL Intravenous PRN Cammie Sickle, MD   10 mL at 12/01/15 0850    Musculoskeletal: Strength & Muscle Tone: decreased Gait & Station: unable to stand Patient leans: N/A  Psychiatric Specialty Exam: Physical Exam  ROS  Blood pressure (!) 135/57,  pulse 91, temperature 98.5 F (36.9 C), temperature source Oral, resp. rate (!) 24, height _0  (1.651 m), weight 73.2 kg (161 lb 6.4 oz), SpO2 96 %.Body mass index is 26.86 kg/m.  General Appearance: Fairly Groomed  Eye Contact:  Good  Speech:  Slow  Volume:  Decreased  Mood:  Dysphoric  Affect:  Constricted  Thought Process:  Linear and Descriptions of Associations: Intact  Orientation:  Full (Time, Place, and Person)  Thought Content:  Hallucinations: None  Suicidal Thoughts:  No  Homicidal Thoughts:  No  Memory:  Immediate;   Fair Recent;   Fair Remote;   Fair  Judgement:  Fair  Insight:  Fair  Psychomotor Activity:  Decreased  Concentration:  Concentration: Fair and Attention Span: Fair  Recall:  Good  Fund of Knowledge:  Good  Language:  Good  Akathisia:  No  Handed:    AIMS (if indicated):     Assets:  Communication Skills Physical Health  ADL's:  Intact  Cognition:  WNL  Sleep:        Treatment Plan Summary:  Patient tells me she understands she has breast cancer and the cancer has metastasized. She however continues to have hope that she could heal. She denies depressive symptoms or anxiety and is states that she relies on her faith to continue to push through.  She will like to have therapies to speak with once discharged.   I agree with plans for involving the family with palliative care.  Patient tells me she lives by herself and she definitely will need help if she were to return home.  Consider occupational therapy and physical therapy consultations prior to discharge for clearance.  Supportive care seems to be the most appropriate step at this time.  Continue benzodiazepines and hypnotics as needed.  I also have recommended mirtazapine to help with nausea and insomnia.   Disposition: No evidence of imminent risk to self or others at present.    Hildred Priest, MD 02/15/2017 12:37 PM

## 2017-02-15 NOTE — Progress Notes (Signed)
Clinical Education officer, museum (CSW) discussed case with Palliative NP who stated the plan is with home with hospice. RN case manager consult is in. Please reconsult if future social work needs arise. CSW signing off.   [employer], LCSW [phone]

## 2017-02-15 NOTE — Evaluation (Addendum)
Physical Therapy Evaluation Patient Details Name: Dawn Allen MRN: [id number] DOB: June 26, 1951 Today's Date: 02/15/2017   History of Present Illness  Pt is a 66 y/o F who presented with back pain and weakness.  Pt found to have GI bleed of unclear etiology.  Imaging completed due to back pain which revealed no metastasis per bone scan but broken ribs on the R side.  Chest x-ray revealed L pleural effusion.  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 Pt was discharged earlier in the day for R upper quadrant pain secondary to liver metastases.  Pt was seen by Oncologist who recommended palliative care and hospice as he did not feel pt is a candidate for chemotherapy.  Pt's PMH includes breast cancer, CD, DVT, MI, seizures.      Clinical Impression  Pt admitted with above diagnosis. Pt currently with functional limitations due to the deficits listed below (see PT Problem List). Dawn Allen was initially agreeable to therapy when her family is in the room.  After they leave and pt is sitting EOB the pt politely declines further activity due to emotions related to her recent Palliative Care meeting.  She is motivated and agreeable to work with PT moving forward as she would like to be as independent as possible.  She currently requires min assist for bed mobility supine>sit and fatigues quickly. Pt will benefit from skilled PT to increase their independence and safety with mobility to allow discharge to the venue listed below.      Follow Up Recommendations SNF    Equipment Recommendations  None recommended by PT    Recommendations for Other Services OT consult (For energy conservation techniques and improved QOL)     Precautions / Restrictions Precautions Precautions: Fall Precaution Comments: Monitor Hgb as pt has had rectal bleeding Restrictions Weight Bearing Restrictions: No      Mobility  Bed Mobility Overal bed mobility: Needs Assistance Bed Mobility:  Supine to Sit;Sit to Supine     Supine to sit: Min assist;HOB elevated Sit to supine: Min guard   General bed mobility comments: Pt attempts supine>sit without assist with use of bed rail but fatigues on attempt and requires assist on second attempt to elevate trunk.  Pt does not require physical assist to return to supine.  Transfers                 General transfer comment: Pt politely declined as she is expereincing emotions from her recent meeting with Palliative Care  Ambulation/Gait                Stairs            Wheelchair Mobility    Modified Rankin (Stroke Patients Only)       Balance Overall balance assessment: Needs assistance Sitting-balance support: No upper extremity supported;Feet supported Sitting balance-Leahy Scale: Good                                       Pertinent Vitals/Pain Pain Assessment: Faces Faces Pain Scale: Hurts even more Pain Location: back Pain Descriptors / Indicators: Aching Pain Intervention(s): Limited activity within patient's tolerance;Monitored during session    Home Living Family/patient expects to be discharged to:: Private residence Living Arrangements: Alone Available Help at Discharge: Family;Available PRN/intermittently Type of Home: House Home Access: Stairs to enter Entrance Stairs-Rails: Left;Right;Can reach both Entrance [employer]  of Steps: 6 Home Layout: One level Home Equipment: [place] - 4 wheels;Walker - 2 wheels;Cane - single point      Prior Function Level of Independence: Needs assistance   Gait / Transfers Assistance Needed: Pt was ambulating without AD and denies any falls in the past 6 months.    ADL's / Homemaking Assistance Needed: Pt requires assist for bathing back but otherwise pt reports she is independent with ADLs, IADLs.        Hand Dominance   Dominant Hand: Right    Extremity/Trunk Assessment   Upper Extremity Assessment Upper Extremity  Assessment:  (BUE strength grossly 4-/5)    Lower Extremity Assessment Lower Extremity Assessment:  (BLE strength grossly 4/5)       Communication   Communication: No difficulties  Cognition Arousal/Alertness: Awake/alert Behavior During Therapy:  (Reflective, emotional) Overall Cognitive Status: Within Functional Limits for tasks assessed                                        General Comments General comments (skin integrity, edema, etc.): Pt initially agreeable to therapy when her family is in the room.  After they leave and pt is sitting EOB the pt politely declines further activity due to emotions related to her recent Palliative Care meeting.    Exercises     Assessment/Plan    PT Assessment Patient needs continued PT services (Pt motivated and agreeable to work with PT moving forward)  PT Problem List Decreased strength;Decreased balance;Decreased knowledge of use of DME;Decreased safety awareness       PT Treatment Interventions DME instruction;Gait training;Stair training;Functional mobility training;Therapeutic activities;Therapeutic exercise;Balance training;Neuromuscular re-education;Patient/family education;Modalities;Wheelchair mobility training    PT Goals (Current goals can be found in the Care Plan section)  Acute Rehab PT Goals Patient Stated Goal: to get stronger and remain as indpendent as possible.  "I want to fight this" PT Goal Formulation: With patient Time For Goal Achievement: 03/01/17 Potential to Achieve Goals: Fair    Frequency Min 2X/week   Barriers to discharge Inaccessible home environment;Decreased caregiver support Pt lives alone with steps to enter home    Co-evaluation               AM-PAC PT "6 Clicks" Daily Activity  Outcome Measure Difficulty turning over in bed (including adjusting bedclothes, sheets and blankets)?: Total Difficulty moving from lying on back to sitting on the side of the bed? :  Total Difficulty sitting down on and standing up from a chair with arms (e.g., wheelchair, bedside commode, etc,.)?: Total Help needed moving to and from a bed to chair (including a wheelchair)?: A Little Help needed walking in hospital room?: A Lot Help needed climbing 3-5 steps with a railing? : A Lot 6 Click Score: 10    End of Session   Activity Tolerance: Patient limited by fatigue;Other (comment) (limited due to pt's emotional state) Patient left: in bed;with call bell/phone within reach;with bed alarm set Nurse Communication: Mobility status PT Visit Diagnosis: Muscle weakness (generalized) (M62.81);Unsteadiness on feet (R26.81)    Time: 1610-9604 PT Time Calculation (min) (ACUTE ONLY): 29 min   Charges:   PT Evaluation $PT Eval Low Complexity: 1 Procedure PT Treatments $Therapeutic Activity: 8-22 mins   PT G Codes:        Collie Siad PT, DPT 02/15/2017, 2:25 PM

## 2017-02-16 ENCOUNTER — Inpatient Hospital Stay: Payer: Medicare Other

## 2017-02-16 ENCOUNTER — Encounter: Payer: Self-pay | Admitting: Radiology

## 2017-02-16 DIAGNOSIS — C50919 Malignant neoplasm of unspecified site of unspecified female breast: Secondary | ICD-10-CM

## 2017-02-16 DIAGNOSIS — Z7189 Other specified counseling: Secondary | ICD-10-CM

## 2017-02-16 DIAGNOSIS — Z515 Encounter for palliative care: Secondary | ICD-10-CM

## 2017-02-16 LAB — PROTIME-INR
INR: 1.11
Prothrombin Time: 14.4 seconds (ref 11.4–15.2)

## 2017-02-16 LAB — HEMOGLOBIN
HEMOGLOBIN: 8.4 g/dL — AB (ref 12.0–16.0)
Hemoglobin: 10.7 g/dL — ABNORMAL LOW (ref 12.0–16.0)
Hemoglobin: 8.2 g/dL — ABNORMAL LOW (ref 12.0–16.0)

## 2017-02-16 LAB — GLUCOSE, CAPILLARY
Glucose-Capillary: 142 mg/dL — ABNORMAL HIGH (ref 65–99)
Glucose-Capillary: 207 mg/dL — ABNORMAL HIGH (ref 65–99)
Glucose-Capillary: 128 mg/dL — ABNORMAL HIGH (ref 65–99)

## 2017-02-16 MED ORDER — TECHNETIUM TC 99M-LABELED RED BLOOD CELLS IV KIT
20.0000 | PACK | Freq: Once | INTRAVENOUS | Status: AC | PRN
  Administered 2017-02-16: 17:00:00 22.644 via INTRAVENOUS

## 2017-02-16 NOTE — Care Management Important Message (Signed)
Important Message  Patient Details  Name: Dawn Allen MRN: [id number] Date of Birth: 05/09/1951   Medicare Important Message Given:  Yes    Shelbie Ammons, RN 02/16/2017, 8:16 AM

## 2017-02-16 NOTE — Progress Notes (Signed)
Came twice to check on patient- unavailable. This evening patient undergoing a bleeding scan.  Overall poor prognosis. Recommend hospice services.

## 2017-02-16 NOTE — Progress Notes (Signed)
Noted patient sent to bleeding scan for concern for GI bleed.   Await results. No GI coverage tonight . If endoscopy needed will need transfer out.   Dr [name] MD,MRCP [facility]) Gastroenterology/Hepatology Pager: [phone]

## 2017-02-16 NOTE — Progress Notes (Signed)
Patient is very agitated and upset this morning as various members of her care team are coming into her room to organize discharge planning. Patient states "I do not need this negativity right now of Dr. B telling me I have x amount of days to live. I am not giving up. And why is this woman asking me about hospice at home? I can't go home alone."   Emotional support given. I explained to the patient we are trying to be transparent about her current physical state as well as provide the resources she needs to be safe once she is discharge whether home or to rehab. Patient feels overwhelmed at this time, which is understandable given her poor prognosis.   I told patient [name] plans to come back to see her this afternoon around 1630 to speak with her (and family if she would like.)

## 2017-02-16 NOTE — Clinical Social Work Placement (Signed)
   CLINICAL SOCIAL WORK PLACEMENT  NOTE  Date:  02/16/2017  Patient Details  Name: Dawn Allen MRN: [id number] Date of Birth: 09-25-1950  Clinical Social Work is seeking post-discharge placement for this patient at the [place] level of care (*CSW will initial, date and re-position this form in  chart as items are completed):  Yes   Patient/family provided with [place] Work Department's list of facilities offering this level of care within the geographic area requested by the patient (or if unable, by the patient's family).  Yes   Patient/family informed of their freedom to choose among providers that offer the needed level of care, that participate in Medicare, Medicaid or managed care program needed by the patient, have an available bed and are willing to accept the patient.  Yes   Patient/family informed of 's ownership interest in [facility] and [facility], as well as of the fact that they are under no obligation to receive care at these facilities.  PASRR submitted to EDS on 02/15/17     PASRR number received on 02/15/17     Existing PASRR number confirmed on       FL2 transmitted to all facilities in geographic area requested by pt/family on 02/16/17     FL2 transmitted to all facilities within larger geographic area on       Patient informed that his/her managed care company has contracts with or will negotiate with certain facilities, including the following:            Patient/family informed of bed offers received.  Patient chooses bed at       Physician recommends and patient chooses bed at      Patient to be transferred to   on  .  Patient to be transferred to facility by       Patient family notified on   of transfer.  Name of family member notified:        PHYSICIAN       Additional Comment:    _______________________________________________ Haddie Bruhl, [name], LCSW 02/16/2017, 2:40 PM

## 2017-02-16 NOTE — Care Management (Signed)
Admitted to this facility with intractable back pain. Lives with step brother. Daughter is [name] [phone]). Friend is Kern Reap [phone]). States she hasn't seen Dr, Bernita Buffy in a while. Pres[facility] are filled at [facility]. Goes to the [facility] for the diagnosis of breast cancer.  Received referral for Home Hospice. Discussed this recommendation with Ms. Wasilewski this morinng. States that there is no one to help her out in the home.  Will follow up with palliative care on discharge disposition Shelbie Ammons RN MSN [employer] [facility] [phone]

## 2017-02-16 NOTE — Progress Notes (Signed)
PT Cancellation Note  Patient Details Name: Dawn Allen MRN: [id number] DOB: 02/04/1951   Cancelled Treatment:    Reason Eval/Treat Not Completed: Patient at procedure or test/unavailable (Pt currently off floor for bleeding scan).  Will continue to follow acutely.   Collie Siad PT, DPT 02/16/2017, 3:35 PM

## 2017-02-16 NOTE — Progress Notes (Signed)
[facility] NAME: Dawn Allen    MR#:  [id number]  DATE OF BIRTH:  [birth year]  SUBJECTIVE:  CHIEF COMPLAINT:   Chief Complaint  Patient presents with  . Back Pain   The patient is 66 year old female with history of metastatic breast cancer, who presents to the hospital with complaints of severe back pain. She has known metastatic breast cancer with radiation to liver and bone. She was recently seen by her primary oncologist [name], who recommended a bone scan, which was not performed.. Patient returns back due to significant pain. She explains that her pain is mostly in mid thoracic lower thoracic area and related to movements.The patient developed significant rectal bleeding yesterday, hemoglobin level drifted down from 13.4-8.6 today. Patient continues to pass some blood per rectum with clots. Denies abdominal pain except as above. The patient was seen by psychiatrist, who recommended  Remeron for sleep and appetite, however, did not feel that patient has significant depressive symptoms. The patient was seen by oncologist, who recommended palliative care consultation and hospice, he did not feel that patient is a candidate for chemotherapy at this time. Patient underwent left paracentesis of about 1 L and feels a little bit better today, less shortness of breath. The patient was seen by cardiologist and neurologist, who felt that patient's gastrointestinal bleed is from peptic ulcer due to go to palliative or bleeding from sphincterotomy site at ERCP due to with about her, recommended watchful approach, bleeding scan if bleeding is worsened   Review of Systems  Constitutional: Negative for chills, fever and weight loss.  HENT: Negative for congestion.   Eyes: Negative for blurred vision and double vision.  Respiratory: Negative for cough, sputum production, shortness of breath and wheezing.   Cardiovascular: Positive for  chest pain. Negative for palpitations, orthopnea, leg swelling and PND.  Gastrointestinal: Positive for abdominal pain and blood in stool. Negative for constipation, diarrhea, nausea and vomiting.  Genitourinary: Negative for dysuria, frequency, hematuria and urgency.  Musculoskeletal: Positive for back pain. Negative for falls.  Neurological: Negative for dizziness, tremors, focal weakness and headaches.  Endo/Heme/Allergies: Does not bruise/bleed easily.  Psychiatric/Behavioral: Negative for depression. The patient does not have insomnia.     VITAL SIGNS: Blood pressure (!) 120/56, pulse 88, temperature 98.4 F (36.9 C), temperature source Oral, resp. rate 20, height 5\' 5"  (1.651 m), weight 73.2 kg (161 lb 6.4 oz), SpO2 95 %.  PHYSICAL EXAMINATION:   GENERAL:  66 y.o.-year-old patient lying in the bed with no acute distress.  EYES: Pupils equal, round, reactive to light and accommodation. No scleral icterus. Extraocular muscles intact.  HEENT: Head atraumatic, normocephalic. Oropharynx and nasopharynx clear.  NECK:  Supple, no jugular venous distention. No thyroid enlargement, no tenderness. Palpation of thoracic spine reveals tenderness in lower thoracic area, lumbar area, but not in chest LUNGS: Normal breath sounds bilaterally, no wheezing, rales,rhonchi or crepitation. No use of accessory muscles of respiration.  CARDIOVASCULAR: S1, S2 normal. No murmurs, rubs, or gallops.  ABDOMEN: Soft, tender diffusely, mostly in the right side, liver edge is hard, tender to touch. Abdomen is nondistended. Bowel sounds present. No organomegaly or mass.  EXTREMITIES: No pedal edema, cyanosis, or clubbing.  NEUROLOGIC: Cranial nerves II through XII are intact. Muscle strength 5/5 in all extremities. Sensation intact. Gait not checked.  PSYCHIATRIC: The patient is alert and oriented x 3.  SKIN: No obvious rash, lesion, or ulcer.   ORDERS/RESULTS REVIEWED:  CBC  Recent Labs Lab 02/12/17 1239  02/13/17 1916 02/14/17 0513  02/15/17 0130 02/15/17 0918 02/15/17 1724 02/16/17 0128 02/16/17 0942  WBC 8.2 8.8 8.6  --   --   --   --   --   --   HGB 13.4 10.7* 9.6*  < > 8.6* 10.1* 9.5* 10.7* 8.2*  HCT 40.5 32.6* 28.7*  --   --   --   --   --   --   PLT 155 165 156  --   --   --   --   --   --   MCV 89.9 89.5 89.8  --   --   --   --   --   --   MCH 29.7 29.5 30.0  --   --   --   --   --   --   MCHC 33.1 32.9 33.4  --   --   --   --   --   --   RDW 17.5* 17.4* 17.1*  --   --   --   --   --   --   < > = values in this interval not displayed. ------------------------------------------------------------------------------------------------------------------  Chemistries   Recent Labs Lab 02/12/17 0945 02/13/17 0452 02/13/17 1916 02/14/17 0036 02/14/17 0513 02/15/17 0918 02/15/17 1724  NA 128* 131* 131*  --  130* 134*  --   K 4.8 3.8 5.0  --  4.5  --   --   CL 94* 99* 95*  --  100*  --   --   CO2 26 25 25   --  24  --   --   GLUCOSE 204* 166* 168*  --  146*  --   --   BUN 21* 31* 41*  --  49*  --   --   CREATININE 0.53 0.61 0.75  --  0.70  --   --   CALCIUM 8.9 8.3* 8.5*  --  8.2*  --   --   MG  --   --   --  2.0  --   --  2.1  AST 169* 169* 169*  --  155*  --   --   ALT 89* 80* 79*  --  72*  --   --   ALKPHOS 995* 861* 828*  --  734*  --   --   BILITOT 14.4* 12.5* 12.3*  --  11.0*  --   --    ------------------------------------------------------------------------------------------------------------------ estimated creatinine clearance is 70.3 mL/min (by C-G formula based on SCr of 0.7 mg/dL). ------------------------------------------------------------------------------------------------------------------ No results for input(s): TSH, T4TOTAL, T3FREE, THYROIDAB in the last 72 hours.  Invalid input(s): FREET3  Cardiac Enzymes  Recent Labs Lab 02/14/17 0036 02/14/17 0513 02/14/17 1119  TROPONINI 0.05* 0.06* 0.08*    ------------------------------------------------------------------------------------------------------------------ Invalid input(s): POCBNP ---------------------------------------------------------------------------------------------------------------  RADIOLOGY: Dg Chest 1 View  Result Date: 02/15/2017 CLINICAL DATA:  66 year old female status post thoracentesis EXAM: CHEST 1 VIEW COMPARISON:  02/14/2017, CT chest 02/14/2017 FINDINGS: Interval improvement in aeration at the left base, with persisting blunting of left costophrenic angle on obscuration left hemidiaphragm. No visualized pneumothorax. Right lung relatively well aerated. Coarsened interstitial markings persist. Unchanged position of right IJ port catheter with the tip appearing to terminate superior vena cava. Calcifications of the aortic arch. No displaced fracture. IMPRESSION: Improved aeration on the left status post thoracentesis, with no evidence of pneumothorax. Opacity persisting at the left base likely a combination of small residual pleural fluid,  atelectasis/ consolidation. Unchanged right IJ port catheter. Electronically Signed   By: Corrie Mckusick D.O.   On: 02/15/2017 16:28   Ct Chest Wo Contrast  Result Date: 02/14/2017 CLINICAL DATA:  Initial evaluation for pleural effusion. EXAM: CT CHEST WITHOUT CONTRAST TECHNIQUE: Multidetector CT imaging of the chest was performed following the standard protocol without IV contrast. COMPARISON:  Prior radiograph from earlier the same day. FINDINGS: Cardiovascular: Intrathoracic aorta of normal caliber without acute abnormality. Moderate atheromatous plaque present throughout the visualized aorta. Partially visualized great vessels grossly within normal limits. Heart size normal. Diffuse 3 vessel coronary artery calcifications. Possible few scattered areas of irregular pleural thickening, most notable on the right (series 2, image 72). Mediastinum/Nodes: Visualized thyroid somewhat  heterogeneous. 9 mm right cardiophrenic node (series 2, image 92). Additional nodular soft tissue thickening at the left cardiophrenic angle measuring up to 12 mm (series 2, image 97). Findings concerning for metastatic disease, and appear progressed from prior. No other definite pathologically enlarged mediastinal, hilar or, or axillary lymph nodes identified on this noncontrast examination. The esophagus normal. Small hiatal hernia. Lungs/Pleura: Tracheobronchial tree is patent centrally. There has been interval development of a large left pleural effusion. Irregular opacity along the left major fissure likely related to fluid tracking along the fissure. Few scattered areas of irregular pleural thickening present along the visualized anterior and medial pleural surface (series 2, image 65), concerning for metastatic disease. Associated left lower lobe atelectasis/collapse. There is a new 11 mm left lower lobe pulmonary nodule (series 3, image 43), concerning for metastatic disease. Additional scattered multifocal ground-glass opacities within the bilateral upper lobes, which may reflect post radiation changes or infiltrates. Small layering right pleural effusion present as well. No pneumothorax. New 9 mm right lower lobe nodule (series 3, image 87). Upper Abdomen: Previously identified mass within the central aspect the liver not well visualized due to lack of IV contrast, however, there is suggestion that this lesion has increased in size and is now poorly defined and infiltrative. Additionally, there is seen likely a few additional new lesions within the peripheral subcapsular left hepatic lobe measuring up to approximately 16 mm (series 2, image 116). Diffuse thickening of the bilateral adrenal glands grossly similar. Remainder of the visualized upper abdomen otherwise up grossly unremarkable. Musculoskeletal: 5.2 cm hypodense collection at the lateral right breast, likely postoperative seroma and/or hematoma,  similar to previous. Diffuse anasarca. No acute osseous abnormality. No worrisome lytic or blastic osseous lesions. IMPRESSION: 1. Interval development of large left pleural effusion. Scattered areas of irregular/nodular pleural thickening about the left lung suggest possible pleural-based metastases, suggesting at this effusion is malignant in nature. Question additional irregular nodular thickening involving the pericardial surface as well. No significant pericardial effusion on this exam. 2. Associated left lower lobe atelectasis/consolidation. 3. New bilateral pulmonary nodules involving the bilateral lower lobes, concerning for metastatic disease. 4. New bilateral cardiophrenic nodes/soft tissue thickening, consistent with metastatic disease. 5. Poor visualization of previously identified hepatic metastasis involving the central aspect of the liver, although suspected to be worsened with increased size and poorly infiltrated nature. Probable additional new hepatic lesions within the subcapsular left hepatic lobe as above. 6. Small layering right pleural effusion. 7. Scattered patchy ground-glass opacity within the peripheral right upper lobe. Findings favored to reflect postradiation changes, although infiltrates could be considered in the correct clinical setting. 8. Persistent 5.2 cm hypodense collection within the peripheral right breast, consistent with a postoperative seroma or possibly hematoma, similar to previous. Electronically Signed  By: Jeannine Boga M.D.   On: 02/14/2017 19:42   US Thoracentesis Asp Pleural Space W/img Guide  Result Date: 02/15/2017 INDICATION: Shortness of breath and large left pleural effusion. EXAM: ULTRASOUND GUIDED LEFT THORACENTESIS MEDICATIONS: None. COMPLICATIONS: None immediate. PROCEDURE: An ultrasound guided thoracentesis was thoroughly discussed with the patient and questions answered. The benefits, risks, alternatives and complications were also discussed.  The patient understands and wishes to proceed with the procedure. Written consent was obtained. Ultrasound was performed to localize and mark an adequate pocket of fluid in the left chest. The area was then prepped and draped in the normal sterile fashion. 1% Lidocaine was used for local anesthesia. Under ultrasound guidance a Safe-T-Centesis catheter was introduced. Thoracentesis was performed. The catheter was removed and a dressing applied. FINDINGS: A total of approximately 1 L of dark amber fluid was removed. Samples were sent to the laboratory as requested by the clinical team. IMPRESSION: Successful ultrasound guided left thoracentesis yielding 1 L of pleural fluid. Electronically Signed   By: Markus Daft M.D.   On: 02/15/2017 16:46    EKG:  Orders placed or performed during the hospital encounter of 02/13/17  . 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 Acute posthemorrhagic anemia, hemoglobin level Has dropped down over the past 24 hours by about 2 g, follow hemoglobin level closely and transfuse patient as needed, type and screen #2. Gastrointestinal bleed thought to be due to peptic ulcer disease due to Goody powders or sphincterostomy site at ERCP due to [facility] powders, continue Protonix intravenously twice daily, clear liquid diet, appreciate gastroenterology's input, bleeding has worsened, getting gastrointestinal bleeding scan at nuclear medicine, may need to have vascular surgery input #3. Intractable back pain , no metastasis to the bone per bone scan, , but broken ribs  on the right side,appreciate oncologist input, continue pain medications, no significant help with Lidoderm patch or  K pad #4. Hyponatremia due to SIADH, initiated fluid restrictions, sodium level improved ,  follow in the morning #5. Malnutrition, hypoalbuminemia, appreciate dietitian's  input, palliative care consultation is appreciated, Remeron is to be continued , started by psychiatrist to help with appetite, mood, sleep #6. Elevated transaminases due to infiltrative disease, seems to be stable, supportive therapy #7. Demand ischemia, hypotension, relative hypoxia, was initially concerning for pulmonary embolism, however chest x-ray revealed left pleural effusion, status post left paracentesis, palliative care discussed with the patient and initially plan was to discharge patient home with hospice, however, patient refused and wants to go to skilled nursing facility, social worker consultation is requested for placement  #8. SVT, magnesium level was normal, suspected due to large pleural effusion, albuterol inhaler, status post left  thoracentesis With about 1 L of fluid removed, heart rate remained stable, in sinus  Management plans discussed with the patient, family and they are in agreement.   DRUG ALLERGIES:  Allergies  Allergen Reactions  . Penicillins Swelling    rash    CODE STATUS:     Code Status Orders  Start     Ordered   02/13/17 2323  Full code  Continuous     02/13/17 2322    Code Status History    Date Active Date Inactive Code Status Order ID Comments User Context   02/09/2017  4:22 AM 02/13/2017  5:01 PM Full Code [id number]  Harrie Foreman, MD Inpatient   03/02/2015  3:42 PM 03/05/2015  5:21 PM Full Code [id number]  Idelle Crouch, MD Inpatient   02/27/2015  4:48 PM 02/28/2015 11:06 AM Full Code [id number]  Theodoro Grist, MD Inpatient      TOTAL TIME TAKING CARE OF THIS PATIENT: 40 minutes.    Theodoro Grist M.D on 02/16/2017 at 4:04 PM  Between 7am  to 6pm - Pager - [phone]  After 6pm go to [email or web address] - password [email or web address] [facility]  [facility]  Office  [phone]  CC: Primary care physician; Lorelee Market, MD

## 2017-02-16 NOTE — Progress Notes (Signed)
Daily Progress Note   Patient Name: Dawn Allen       Date: 02/16/2017 DOB: December 03, 1950  Age: 66 y.o. 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  Dawn Allen in bed asleep. Awakens easily upon my arrival. She is upset regarding discussions this morning with case management around her planned discharge home with Hospice. She states she cannot go home because she cannot stand up and take care of herself. We discussed how this was the reason for support from her family and Hospice services at home as we discussed yesterday. The alternative is to go to rehab facility. Dawn Allen states she does not want to go to a nursing facility. Dawn Allen's states, "I'm not giving up" and that her GOC are "to be able to walk out of this hospital by myself". I gently encouraged Agatha to explore options for the reality that she may not be able to accomplish this goal.  When I asked Dawn Allen what she means by "not giving up" she says she's not going to lie down and die. She states she knows that she doesn't have long to live, and she understands that her cancer is not going away, but she doesn't want to go to a nursing home, and she can't go home because she can't take care of herself (accurate assessment). I called and talked to her daughter Dawn Allen. Dawn Allen tells me she just spoke to [place] and Mykal told her that her home was condemned. This brings a new element into Lyliana's situation. Dawn Allen is going to talk with  Dawn Allen's sisters and we are going to reconvene tomorrow for follow-up meeting to discuss plan with Dawn Allen.   ROS  Length of Stay: 3  Current Medications: Scheduled Meds:  . feeding supplement (ENSURE ENLIVE)  237 mL Oral BID BM  . guaiFENesin  600 mg Oral BID  . insulin aspart  0-15 Units Subcutaneous TID WC  . insulin aspart  0-5 Units Subcutaneous QHS  . lactulose  20 g Oral BID  . lidocaine  2 patch Transdermal Q24H  . linagliptin  5 mg Oral Daily  . lisinopril  20 mg Oral Daily  . mirtazapine  7.5 mg Oral QHS  . nicotine  21 mg Transdermal Daily  . pantoprazole (PROTONIX) IV  40 mg Intravenous Q12H  . phenytoin  300 mg Oral Daily    Continuous Infusions: . sodium chloride 75 mL/hr at 02/16/17 0439    PRN Meds: albuterol, diazepam, gi cocktail, HYDROmorphone (DILAUDID) injection, lidocaine-prilocaine, nitroGLYCERIN, ondansetron **OR** ondansetron (ZOFRAN) IV, oxyCODONE, zolpidem  Physical Exam          Vital Signs: BP (!) 120/56 (BP Location: Right Arm)   Pulse 88   Temp 98.4 F (36.9 C) (Oral)   Resp 20   Ht _0  (1.651 m)   Wt 73.2 kg (161 lb 6.4 oz)   SpO2 95%   BMI 26.86 kg/m  SpO2: SpO2: 95 % O2 Device: O2 Device: Not Delivered O2 Flow Rate:    Intake/output summary:  Intake/Output Summary (Last 24 hours) at 02/16/17 1348 Last data filed at 02/16/17 0800  Gross per 24 hour  Intake              240 ml  Output              900 ml  Net             -660 ml   LBM: Last BM Date: 02/16/17 Baseline Weight: Weight: 72.1 kg (159 lb) Most recent weight: Weight: 73.2 kg (161 lb 6.4 oz)       Palliative Assessment/Data: PPS: 40%    Flowsheet Rows     Most Recent Value  Intake Tab  Referral Department  Hospitalist  Unit at Time of Referral  Oncology Unit  Palliative Care Primary Diagnosis  Cancer  Date Notified  02/13/17  Palliative Care Type  New Palliative care  Reason for referral  Clarify Goals of Care  Date of Admission  02/13/17  # of days IP  prior to Palliative referral  0  Clinical Assessment  Psychosocial & Spiritual Assessment  Palliative Care Outcomes      Patient Active Problem List   Diagnosis Date Noted  . 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 Palliative care by specialist   . Advance care planning   . Goals of care, counseling/discussion   . GI bleed   . Metastatic breast cancer ([place])   . Intractable back pain 02/13/2017  . Liver metastases ([place])   . Carcinoma of overlapping sites of right breast in female, estrogen receptor negative ([place]) 02/17/2016  . Thyroid nodule 04/29/2015  . Squamous cell lung cancer ([place]) 04/23/2015  . Generalized weakness 03/02/2015  . Coronary artery disease   . Hypertension   . HLD (hyperlipidemia)   . Diabetic neuropathy ([place]) 05/31/2013    Palliative Care Assessment & Plan   Patient Profile: 66 y.o.femalewith past medical history of metastatic breast cancer (to liver dx Jan 2017, was on palliative Taxol, last treatment was in May) and lung cancer (now with likely met pleural effusions, had radiation tx, no further treatments), anxiety, CAD, diabetes, DVT, HTN, MI, deizures admitted on 6/24/2018with weakness and back pain. She was discharged on 6/23 for hyperammoniemia, jaundice and RUQ pain from progressing liver mets with biliary stent placement. Palliative medicine consulted for [place] and symptom management.   Assessment/Recommendations/Plan   Followup meeting planned tomorrow at 1pm with patient and family for further [place] and disposition discussion.  Goals of Care and Additional Recommendations:  Limitations  on Scope of Treatment: Avoid Hospitalization, No Chemotherapy and No Diagnostics  Code Status:  Full code  Prognosis:   < 3 months  Discharge Planning:  To Be Determined  Care plan was discussed with patient and her daughter, [name].  Thank you for allowing the Palliative Medicine Team to assist in  the care of this patient.   Time In: 1000 Time Out: 1100 Total Time 60 minutes Prolonged Time Billed No      Greater than 50%  of this time was spent counseling and coordinating care related to the above assessment and plan.  Mariana Kaufman, AGNP-C Palliative Medicine   Please contact Palliative Medicine Team phone at [phone] for questions and concerns.

## 2017-02-16 NOTE — Progress Notes (Addendum)
Dr. Ether Griffins notified patient has had 2 large black/bloody BM today. Hgb dropped by 10.7 to 8.2 STAT bleeding scan ordered.

## 2017-02-16 NOTE — Clinical Social Work Note (Signed)
Clinical Social Work Assessment  Patient Details  Name: Dawn Allen MRN: [id number] Date of Birth: [birth year]  Date of referral:  02/16/17               Reason for consult:  Facility Placement                Permission sought to share information with:  Chartered certified accountant granted to share information::  Yes, Verbal Permission Granted  Name::      Thor::     Relationship::     Contact Information:     Housing/Transportation Living arrangements for the past 2 months:  Single Family Home Source of Information:  Patient, Other (Comment Required) (Sister ) Patient Interpreter Needed:  None Criminal Activity/Legal Involvement Pertinent to Current Situation/Hospitalization:  No - Comment as needed Significant Relationships:  Adult Children, Siblings, Friend, Other Family Members Lives with:  Self Do you feel safe going back to the place where you live?  Yes Need for family participation in patient care:  Yes (Comment)  Care giving concerns:  Patient lives alone in [place].    Social Worker assessment / plan:  Holiday representative (CSW) received verbal consult from RN case manager that patient will likely need SNF. CSW met with patient and her sister Dawn Allen and the nurse was at bedside at patient's request. Patient was alert and oriented X4 and was laying in the bed. CSW introduced self and explained role of CSW department. Patient reported that she lives alone and has nobody that can come check on her. CSW discussed hospice and provided emotional support. Patient refused hospice services of any kind. CSW explained SNF option and home health option. CSW discussed with patient that she is requiring a lot of assistance and doesn't have the help she needs at home. Patient reported that it takes 3 people to get her out of the hospital bed. Patient reported that she has been through worse and she wants to go home. CSW  explained benefits of SNF and short term rehab. CSW explained that medicare requires a 3 night qualifying inpatient stay in a hospital in order to pay for SNF. Patient was admitted to inpatient on 02/13/17. Patient reported that she would consider SNF and mentioned [facility] as a possibility. Patient requested to work with PT again. CSW contacted PT and made her aware of above. Patient is agreeable to SNF search in [facility]. FL2 complete and faxed out. CSW will continue to follow and assist as needed.   Employment status:  Disabled (Comment on whether or not currently receiving Disability) Insurance information:  Medicare, Medicaid In [place] PT Recommendations:  [name] / Referral to community resources:  [place]  Patient/Family's Response to care:  Patient prefers to go home but will consider SNF.   Patient/Family's Understanding of and Emotional Response to Diagnosis, Current Treatment, and Prognosis:  Patient appeared and upset about her diagnosis. CSW provided emotional support.    Emotional Assessment Appearance:  Appears stated age Attitude/Demeanor/Rapport:    Affect (typically observed):  Afraid/Fearful, Agitated, Angry, Anxious, Apprehensive Orientation:  Oriented to Self, Oriented to Place, Oriented to  Time, Oriented to Situation Alcohol / Substance use:  Not Applicable Psych involvement (Current and /or in the community):  No (Comment)  Discharge Needs  Concerns to be addressed:  Discharge Planning Concerns Readmission within the last 30 days:  No Current discharge risk:  Dependent with  Mobility Barriers to Discharge:  Continued Medical Work up   [employer], [name], [name] 02/16/2017, 2:41 PM

## 2017-02-17 ENCOUNTER — Inpatient Hospital Stay: Payer: Medicare Other

## 2017-02-17 LAB — BASIC METABOLIC PANEL
ANION GAP: 6 (ref 5–15)
BUN: 25 mg/dL — ABNORMAL HIGH (ref 6–20)
CALCIUM: 8.3 mg/dL — AB (ref 8.9–10.3)
CHLORIDE: 108 mmol/L (ref 101–111)
CO2: 22 mmol/L (ref 22–32)
Creatinine, Ser: 0.5 mg/dL (ref 0.44–1.00)
GFR calc non Af Amer: >60 mL/min (ref >60)
Glucose, Bld: 127 mg/dL — ABNORMAL HIGH (ref 65–99)
Potassium: 3.9 mmol/L (ref 3.5–5.1)
Sodium: 136 mmol/L (ref 135–145)

## 2017-02-17 LAB — MAGNESIUM: Magnesium: 2.1 mg/dL (ref 1.7–2.4)

## 2017-02-17 LAB — CYTOLOGY - NON PAP

## 2017-02-17 LAB — HEMOGLOBIN
Hemoglobin: 8.4 g/dL — ABNORMAL LOW (ref 12.0–16.0)
Hemoglobin: 9.1 g/dL — ABNORMAL LOW (ref 12.0–16.0)
Hemoglobin: 7.8 g/dL — ABNORMAL LOW (ref 12.0–16.0)

## 2017-02-17 LAB — PROTIME-INR
INR: 1.16
Prothrombin Time: 14.9 seconds (ref 11.4–15.2)

## 2017-02-17 LAB — GLUCOSE, CAPILLARY
GLUCOSE-CAPILLARY: 149 mg/dL — AB (ref 65–99)
Glucose-Capillary: 112 mg/dL — ABNORMAL HIGH (ref 65–99)
Glucose-Capillary: 145 mg/dL — ABNORMAL HIGH (ref 65–99)

## 2017-02-17 MED ORDER — MIRTAZAPINE 15 MG PO TABS
15.0000 mg | ORAL_TABLET | Freq: Every day | ORAL | Status: DC
  Filled 2017-02-17: qty 1

## 2017-02-17 MED ORDER — METOPROLOL TARTRATE 25 MG PO TABS
25.0000 mg | ORAL_TABLET | Freq: Two times a day (BID) | ORAL | Status: DC
Start: 2017-02-17 — End: 2017-02-18
  Administered 2017-02-18: 10:00:00 25 mg via ORAL
  Filled 2017-02-17: qty 1

## 2017-02-17 MED ORDER — KCL IN DEXTROSE-NACL 20-5-0.9 MEQ/L-%-% IV SOLN
INTRAVENOUS | Status: DC
  Administered 2017-02-17 – 2017-02-18 (×2): via INTRAVENOUS
  Filled 2017-02-17 (×3): qty 1000

## 2017-02-17 MED ORDER — BOOST / RESOURCE BREEZE PO LIQD
1.0000 | Freq: Three times a day (TID) | ORAL | Status: DC
  Administered 2017-02-17: 1 via ORAL

## 2017-02-17 NOTE — Progress Notes (Signed)
Dawn Bellows MD, MRCP(U.K) [address]  [place]  [place], [place] [address]  Main: [place] is being followed for GI bleed likely secondary to NSAID use   Subjective: Nofurther bloody bowel movements.    Objective: Vital signs in last 24 hours: Vitals:   02/16/17 1952 02/17/17 0517 02/17/17 0947 02/17/17 1255  BP: (!) 127/59 121/64 (!) 100/55 138/63  Pulse: (!) 102 93 91 (!) 101  Resp:  18 16 16   Temp: 97.9 F (36.6 C) 98.2 F (36.8 C) 98.3 F (36.8 C) 98 F (36.7 C)  TempSrc: Oral Oral Oral Oral  SpO2: 93% 100% 94% 100%  Weight:      Height:       Weight change:   Intake/Output Summary (Last 24 hours) at 02/17/17 1436 Last data filed at 02/17/17 0800  Gross per 24 hour  Intake                0 ml  Output                0 ml  Net                0 ml     Exam: Alert and orientated x 3  Not in any pain or distress  Lab Results: @LABTEST2 @ Micro Results: Recent Results (from the past 240 hour(s))  Body fluid culture     Status: None (Preliminary result)   Collection Time: 02/15/17  4:19 PM  Result Value Ref Range Status   Specimen Description PLEURAL  Final   Special Requests NONE  Final   Gram Stain   Final    RARE WBC PRESENT, PREDOMINANTLY MONONUCLEAR NO ORGANISMS SEEN    Culture   Final    NO GROWTH 1 DAY Performed at [facility], [address] [place], [place] [address]    Report Status PENDING  Incomplete   Studies/Results: Dg Chest 1 View  Result Date: 02/15/2017 CLINICAL DATA:  66 year old female status post thoracentesis EXAM: CHEST 1 VIEW COMPARISON:  02/14/2017, CT chest 02/14/2017 FINDINGS: Interval improvement in aeration at the left base, with persisting blunting of left costophrenic angle on obscuration left hemidiaphragm. No visualized pneumothorax. Right lung relatively well aerated. Coarsened interstitial markings persist. Unchanged position of right IJ port catheter with the tip appearing to  terminate superior vena cava. Calcifications of the aortic arch. No displaced fracture. IMPRESSION: Improved aeration on the left status post thoracentesis, with no evidence of pneumothorax. Opacity persisting at the left base likely a combination of small residual pleural fluid, atelectasis/ consolidation. Unchanged right IJ port catheter. Electronically Signed   By: Corrie Mckusick D.O.   On: 02/15/2017 16:28   Nm Gi Blood Loss  Result Date: 02/16/2017 CLINICAL DATA:  66 year old female with rectal bleeding for 1 day. History of metastatic breast cancer. EXAM: NUCLEAR MEDICINE GASTROINTESTINAL BLEEDING SCAN TECHNIQUE: Sequential abdominal images were obtained following intravenous administration of Tc-62m labeled red blood cells. RADIOPHARMACEUTICALS:  22.6 mCi Tc-51m in-vitro labeled red cells. COMPARISON:  None. FINDINGS: No abnormal areas of activity are identified to suggest active GI bleeding. No significant abnormalities noted. IMPRESSION: No evidence of active GI bleeding. Electronically Signed   By: Margarette Canada M.D.   On: 02/16/2017 19:06   Dg Chest Port 1 View  Result Date: 02/17/2017 CLINICAL DATA:  Follow-up pleural effusion. History of breast malignancy, coronary artery disease and MI, lung malignancy. EXAM: PORTABLE CHEST 1 VIEW COMPARISON:  Portable chest x-ray of February 15, 2017 FINDINGS: There remains a small left pleural effusion. There may have been minimal re-accumulation of fluid since the earlier thoracentesis. There is no pneumothorax. The left hemidiaphragm remains obscured. The right lung is clear. The heart and pulmonary vascularity are normal. There is calcification in the wall of the aortic arch. The porta catheter tip projects over the proximal SVC. IMPRESSION: Small left pleural effusion which has slightly increased in volume since the study of 2 days ago. Stable density at the left lung base. Thoracic aortic atherosclerosis. Electronically Signed   By: David  Martinique M.D.   On:  02/17/2017 13:32   US Thoracentesis Asp Pleural Space W/img Guide  Result Date: 02/15/2017 INDICATION: Shortness of breath and large left pleural effusion. EXAM: ULTRASOUND GUIDED LEFT THORACENTESIS MEDICATIONS: None. COMPLICATIONS: None immediate. PROCEDURE: An ultrasound guided thoracentesis was thoroughly discussed with the patient and questions answered. The benefits, risks, alternatives and complications were also discussed. The patient understands and wishes to proceed with the procedure. Written consent was obtained. Ultrasound was performed to localize and mark an adequate pocket of fluid in the left chest. The area was then prepped and draped in the normal sterile fashion. 1% Lidocaine was used for local anesthesia. Under ultrasound guidance a Safe-T-Centesis catheter was introduced. Thoracentesis was performed. The catheter was removed and a dressing applied. FINDINGS: A total of approximately 1 L of dark amber fluid was removed. Samples were sent to the laboratory as requested by the clinical team. IMPRESSION: Successful ultrasound guided left thoracentesis yielding 1 L of pleural fluid. Electronically Signed   By: Markus Daft M.D.   On: 02/15/2017 16:46   Medications: I have reviewed the patient's current medications. Scheduled Meds: . feeding supplement (ENSURE ENLIVE)  237 mL Oral BID BM  . guaiFENesin  600 mg Oral BID  . insulin aspart  0-15 Units Subcutaneous TID WC  . insulin aspart  0-5 Units Subcutaneous QHS  . lactulose  20 g Oral BID  . lidocaine  2 patch Transdermal Q24H  . linagliptin  5 mg Oral Daily  . lisinopril  20 mg Oral Daily  . mirtazapine  15 mg Oral QHS  . nicotine  21 mg Transdermal Daily  . pantoprazole (PROTONIX) IV  40 mg Intravenous Q12H  . 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 y/o female with metastatic breast cancer, liver mets admitted with shortness of breath , found to have a large left pleural effusion , associated left lower lobe atelectasis/consolidation , metastatic disease. H/o tarry black stool, stable Hb, daily use of goody powder and recent ERCP  Impression : Likely upper GI bleed from either a peptic ulcer from goody powder or bleeding from sphincterotomy site at ERCP worse due to effects of Goody powder. Yesterday she had a black tarry stool and subsequent tagged RBC scan was negative   Plan  1.  Hb stable - Overnight had runs of Vtac. High risk for endoscopy . If not actively bleeding would avoid endoscopy . Continue PPI.  I will sign off.  Please call me if any further GI concerns or questions.  We would like to thank you for the opportunity to participate in the care of Dawn Allen.     LOS: 4 days   Dawn Allen 02/17/2017, 2:36 PM

## 2017-02-17 NOTE — Progress Notes (Signed)
CCMD called and notified writer of another run of Vtach, 9 beats this time. Patient assessed, asymptomatic. Dr. Ether Griffins aware. Will continue to closely monitor.

## 2017-02-17 NOTE — Progress Notes (Signed)
Dr. Ether Griffins notified patient has had increased abdominal pain and cramping and nausea. Continue to ease symptoms of GI bleed with pain and nausea PRN medications. Hgb remains stable at this time.

## 2017-02-17 NOTE — Progress Notes (Signed)
CCMD called and notified writer of 10 beat run of Vtach. Patient assessed, asymptomatic. Obtained vital signs. Dr. Ether Griffins (attending MD) called and notified of above. Skyler, assigned RN made aware. MD to evaluate and place orders. Madlyn Frankel, RN

## 2017-02-17 NOTE — Progress Notes (Signed)
New referral for Palliative to follow at SNF received from Palliative Medicine NP Mariana Kaufman. CSW [employer] made aware, referral made aware. Discharge facility to be determined. Thank you. Flo Shanks RN, BSN, [place] [facility] [place], [facility] [phone] c

## 2017-02-17 NOTE — Progress Notes (Signed)
PT Cancellation Note  Patient Details Name: Dawn Allen MRN: [id number] DOB: 01-06-1951   Cancelled Treatment:    Reason Eval/Treat Not Completed: Other (comment). Pt stated "there's no point", as she's currently under hospice care. However, pt's family would like her to participate in PT. PT told pt to inform nurse if she changes her mind, and wishes to continue PT. Pt agreeable.  Geoffry Paradise, PT,DPT 02/17/17 11:48 AM     Jerrold Haskell L 02/17/2017, 11:48 AM

## 2017-02-17 NOTE — Progress Notes (Signed)
PT Cancellation Note  Patient Details Name: Dawn Allen MRN: [id number] DOB: Oct 14, 1950   Cancelled Treatment:    Reason Eval/Treat Not Completed: Other (comment). Pt eating breakfast/talking on the phone and wished to attempt PT at a later time. PT will re-attempt eval at a later date/time when pt is available.  Geoffry Paradise, PT,DPT 02/17/17 9:25 AM     Francetta Ilg L 02/17/2017, 9:20 AM

## 2017-02-17 NOTE — Progress Notes (Signed)
Nutrition Follow-up  DOCUMENTATION CODES:   Non-severe (moderate) malnutrition in context of chronic illness  INTERVENTION:  Will discontinue order for Ensure Enlive.  Provide Boost Breeze po TID, each supplement provides 250 kcal and 9 grams of protein.   Will continue to monitor discussions regarding goals of care. Patient has now been several weeks with poor PO intake and known 4 days of inadequate intake this admission. Per PMT note on 6/26 one of the limitations on scope of treatment includes no artificial feeding. Agree that if patient will not be undergoing more chemotherapy/treatment that long-term nutrition support would not be indicated. However, patient is discussing going to rehab to "become stronger." If this is truly her goal, would be hard to accomplish with how poor her intake is.  NUTRITION DIAGNOSIS:   Malnutrition (Moderate) related to chronic illness (stg IV breast cancer) as evidenced by percent weight loss, moderate depletions of muscle mass, moderate depletion of body fat.  Ongoing.  GOAL:   Patient will meet greater than or equal to 90% of their needs  Not met.   MONITOR:   PO intake, Supplement acceptance, Labs, Weight trends, I & O's  REASON FOR ASSESSMENT:   Consult Assessment of nutrition requirement/status  ASSESSMENT:   66 year old female with PMHx of HTN, DVT in left leg, hx of MI, DM type 2, seizure disorder, CAD, HLD, anxiety, stage IV breast cancer on palliative chemotherapy with Taxol now admitted with back pain, generalized weakness, and gait instability. Recent admission 6/19-6/24 for jaundice with hyperbilirubinemia due to liver metastasis.  -Oncology recommending no further treatment. Recommending hospice. -Patient being followed by PMT. Limitations on scope of treatment include no chemotherapy, no diagnostics, and no artificial nutrition per chart review. -Patient s/p US guided left thoracentesis (removed 1 L).  -Patient now with GIB.  Per GI note likely related to peptic ulcer from chronic Goody powder use or bleeding from sphincterectomy site at ERCP worsened by [employer] powder. Patient now on CLD.  Spoke with patient at bedside. She reports she is having nausea and abdominal pain. Reports she has not been able to eat well. Noted in chart she had "bites" of lunch tray but 0% of breakfast this morning and dinner last night. She reports she can't tolerate the clear liquids because they are not appetizing. She does not know what is available on the menu. Called diet office and requested they bring menu for patient. She would like something else to eat (besides what is being brought) but is not sure what she wants. Patient reports she has not been able to tolerate the Ensure. She does like the [employer].   Meal Completion: 0% to bites/sips  Medications reviewed and include: Novolog 0-15 units TID, Novolog 0-5 units QHS, lactulose, Remeron, pantoprazole, D5-NS with KCl 20 mEq/L @ 75 ml/hr (1.8 L, 90 grams dextrose, 306 kcal daily).   Labs reviewed: CBG 112-149 past 24 hrs, BUN 25.   Discussed with RN. Patient now also has abdominal pain and cramping in addition to nausea. Had two dark bloody stools today.  Diet Order:  Diet clear liquid Room service appropriate? Yes; Fluid consistency: Thin  Skin:  Reviewed, no issues  Last BM:  02/17/2017 - type 2 (black/red) - will continue to monitor bowel movements. Patient has had several type 2 bowel movements over the past few days, which indicate mild constipation and may be a sign of inadequate hydration.   Height:   Ht Readings from Last 1 Encounters:  02/13/17 5'  5" (1.651 m)    Weight:   Wt Readings from Last 1 Encounters:  02/13/17 161 lb 6.4 oz (73.2 kg)    Ideal Body Weight:  56.8 kg  BMI:  Body mass index is 26.86 kg/m.  Estimated Nutritional Needs:   Kcal:  0698-6148 (MSJ x 1.3-1.5)  Protein:  85-105 grams (1.2-1.5 grams/kg)  Fluid:  2 L/day (30  ml/kg)  EDUCATION NEEDS:   Education needs addressed  Willey Blade, MS, RD, LDN Pager: [phone] After Hours Pager: [phone]

## 2017-02-17 NOTE — Progress Notes (Signed)
Clinical Education officer, museum (CSW) met with patient and provided bed offers for rehab. Patient chose [facility]. CSW contacted [facility] admissions coordinator at [facility] and made her aware of bed offer and that patient will be followed by palliative care. CSW will continue to follow and assist as needed.   [employer], LCSW [phone]

## 2017-02-17 NOTE — Discharge Instructions (Signed)
Refer for Palliative services immediately upon admission to facility.

## 2017-02-17 NOTE — Progress Notes (Addendum)
Daily Progress Note   Patient Name: Dawn Allen       Date: 02/17/2017 DOB: 1950-09-25  Age: 66 y.o. MRN#: [id number] Attending Physician: Theodoro Grist, MD Primary Care Physician: Lorelee Market, MD Admit Date: 02/13/2017  Reason for Consultation/Follow-up: Disposition and Establishing goals of care  Subjective: Met with patient's daughters, and sisters. They have obtained a new house for patient and at first felt they could care for her in home with help from Hospice. However, after reviewing the level of assistance patient will require, it is felt that patient would be better served in a skilled facility.  Family and patient are aware that patient has incurable cancer that is rapidly progressing in her lungs and liver. She has pleural effusion that is likely to recur. She is deconditioned and malnutritioned, with albumin of 1.5 indicating poor prognosis.  Patient has deferred all of her healthcare planning and decision making to her daughter- Dawn Allen. Plan made to d/c patient to skilled facility with community palliative to follow with planned transition to Hospice. Focus of care will be on symptom management.  Patient agreed with this plan. Patient was asked if her heart were to stop, if she stopped breathing- would she want CPR or intubation? She answer NO. She stated she would not want resuscitation. Discussed this as well with patient's daughter Dawn Allen, and patient's sisters who agreed that DNR would best meet patient's GOC. DNR order placed.  Review of Systems  Constitutional: Positive for malaise/fatigue.  Gastrointestinal: Positive for nausea.  Neurological: Positive for weakness.    Length of Stay: 4  Current Medications: Scheduled Meds:  . feeding supplement  1  Container Oral TID BM  . guaiFENesin  600 mg Oral BID  . insulin aspart  0-15 Units Subcutaneous TID WC  . insulin aspart  0-5 Units Subcutaneous QHS  . lactulose  20 g Oral BID  . lidocaine  2 patch Transdermal Q24H  . linagliptin  5 mg Oral Daily  . lisinopril  20 mg Oral Daily  . mirtazapine  15 mg Oral QHS  . nicotine  21 mg Transdermal Daily  . pantoprazole (PROTONIX) IV  40 mg Intravenous Q12H  . phenytoin  300 mg Oral Daily    Continuous Infusions: . dextrose 5 % and 0.9 % NaCl with KCl 20 mEq/L  PRN Meds: albuterol, diazepam, gi cocktail, HYDROmorphone (DILAUDID) injection, lidocaine-prilocaine, nitroGLYCERIN, ondansetron **OR** ondansetron (ZOFRAN) IV, oxyCODONE, zolpidem  Physical Exam  Eyes: Scleral icterus is present.  Cardiovascular:  tachycardic  Pulmonary/Chest: Effort normal.  Abdominal: She exhibits distension.  Skin: Skin is warm and dry.  Nursing note and vitals reviewed.           Vital Signs: BP 138/63 (BP Location: Left Arm)   Pulse (!) 101   Temp 98 F (36.7 C) (Oral)   Resp 16   Ht '5\' 5"'  (1.651 m)   Wt 73.2 kg (161 lb 6.4 oz)   SpO2 100%   BMI 26.86 kg/m  SpO2: SpO2: 100 % O2 Device: O2 Device: Not Delivered O2 Flow Rate:    Intake/output summary:   Intake/Output Summary (Last 24 hours) at 02/17/17 1631 Last data filed at 02/17/17 0800  Gross per 24 hour  Intake                0 ml  Output                0 ml  Net                0 ml   LBM: Last BM Date: 02/16/17 Baseline Weight: Weight: 72.1 kg (159 lb) Most recent weight: Weight: 73.2 kg (161 lb 6.4 oz)       Palliative Assessment/Data: PPS: 30%    Flowsheet Rows     Most Recent Value  Intake Tab  Referral Department  Hospitalist  Unit at Time of Referral  Oncology Unit  Palliative Care Primary Diagnosis  Cancer  Date Notified  02/13/17  Palliative Care Type  New Palliative care  Reason for referral  Clarify Goals of Care  Date of Admission  02/13/17  # of days IP  prior to Palliative referral  0  Clinical Assessment  Psychosocial & Spiritual Assessment  Palliative Care Outcomes      Patient Active Problem List   Diagnosis Date Noted  . 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 Palliative care by specialist   . Advance care planning   . Goals of care, counseling/discussion   . GI bleed   . Metastatic breast cancer ([place])   . Intractable back pain 02/13/2017  . Liver metastases ([place])   . Carcinoma of overlapping sites of right breast in female, estrogen receptor negative ([place]) 02/17/2016  . Thyroid nodule 04/29/2015  . Squamous cell lung cancer ([place]) 04/23/2015  . Generalized weakness 03/02/2015  . Coronary artery disease   . Hypertension   . HLD (hyperlipidemia)   . Diabetic neuropathy ([place]) 05/31/2013    Palliative Care Assessment & Plan   Patient Profile: 66 y.o.femalewith past medical history of metastatic breast cancer (to liver dx Jan 2017, was on palliative Taxol, last treatment was in May) and lung cancer (now with likely met pleural effusions, had radiation tx, no further treatments), anxiety, CAD, diabetes, DVT, HTN, MI, deizures admitted on 6/24/2018with weakness and back pain. She was discharged on 6/23 for hyperammoniemia, jaundice and RUQ pain from progressing liver mets with biliary stent placement. Palliative medicine consulted for [place] and symptom management.   Assessment/Recommendations/Plan   D/C to SNF with palliative, planned transition to Hospice- I expect patient will decline rapidly and need transfer to residential hospice within a few weeks   Goals of Care and Additional Recommendations:  Limitations on Scope of Treatment: Avoid Hospitalization, Minimize Medications, No Artificial Feeding,  No Chemotherapy, No Diagnostics, No Radiation and No Surgical Procedures  Code Status:  DNR  Prognosis:   < 6 weeks d/t rapidly progressing cancer, cardiac instability-  runs of VTAC, failure to thrive (hypoalbuminemia)  Discharge Planning:  [place] for rehab with Palliative care service follow-up  Care plan was discussed with patient and her daughter, Dawn Allen.  Thank you for allowing the Palliative Medicine Team to assist in the care of this patient.   Time In: 1300 Time Out: 1500 Total Time 120 minutes Prolonged Time Billed Yes      Greater than 50%  of this time was spent counseling and coordinating care related to the above assessment and plan.  Mariana Kaufman, AGNP-C Palliative Medicine   Please contact Palliative Medicine Team phone at [phone] for questions and concerns.

## 2017-02-17 NOTE — Progress Notes (Signed)
[place] [facility] [place] NAME: Dawn Allen    MR#:  [id number]  DATE OF BIRTH:  [birth year]  SUBJECTIVE:  CHIEF COMPLAINT:   Chief Complaint  Patient presents with  . Back Pain   The patient is 66 year old female with history of metastatic breast cancer, who presents to the hospital with complaints of severe back pain. She has known metastatic breast cancer with radiation to liver and bone. She was recently seen by her primary oncologist [name], who recommended a bone scan, which was not performed.. Patient returns back due to significant pain. She explains that her pain is mostly in mid thoracic lower thoracic area and related to movements.The patient developed significant rectal bleeding yesterday, hemoglobin level drifted down from 13.4-8.6 today. Patient continues to pass some blood per rectum with clots. Denies abdominal pain except as above. The patient was seen by psychiatrist, who recommended  Remeron for sleep and appetite, however, did not feel that patient has significant depressive symptoms. The patient was seen by oncologist, who recommended palliative care consultation and hospice, he did not feel that patient is a candidate for chemotherapy at this time. Patient underwent left paracentesis of about 1 L 02/15/2017 and feels a little bit better today, less shortness of breath. The patient was seen by cardiologist and gastroenterologist, who felt that patient's gastrointestinal bleed is from peptic ulcer due Goody powders or bleeding from sphincterotomy site at ERCP due to [place], recommended watchful approach, bleeding scan if bleeding is worsened. Patient underwent bleeding scan yesterday, 02/16/2017, which was negative. Patient continues to bleed, having black to maroon stools. Patient was transfused, hemoglobin level remains stable. The patient was noted to have a 10 beat run of V. tach, magnesium level was normal.  Gastroenterologist felt the patient is high risk for an dose and he has signed off. Patient chose to go to rehabilitation facility, [facility] bed is available. Palliative care saw patient in consultation and is agreeable with rehabilitation facility placement  Review of Systems  Constitutional: Negative for chills, fever and weight loss.  HENT: Negative for congestion.   Eyes: Negative for blurred vision and double vision.  Respiratory: Negative for cough, sputum production, shortness of breath and wheezing.   Cardiovascular: Positive for chest pain. Negative for palpitations, orthopnea, leg swelling and PND.  Gastrointestinal: Positive for abdominal pain and blood in stool. Negative for constipation, diarrhea, nausea and vomiting.  Genitourinary: Negative for dysuria, frequency, hematuria and urgency.  Musculoskeletal: Positive for back pain. Negative for falls.  Neurological: Negative for dizziness, tremors, focal weakness and headaches.  Endo/Heme/Allergies: Does not bruise/bleed easily.  Psychiatric/Behavioral: Negative for depression. The patient does not have insomnia.     VITAL SIGNS: Blood pressure 138/63, pulse (!) 101, temperature 98 F (36.7 C), temperature source Oral, resp. rate 16, height 5\' 5"  (1.651 m), weight 73.2 kg (161 lb 6.4 oz), SpO2 100 %.  PHYSICAL EXAMINATION:   GENERAL:  66 y.o.-year-old patient lying in the bed with no acute distress.  EYES: Pupils equal, round, reactive to light and accommodation. No scleral icterus. Extraocular muscles intact.  HEENT: Head atraumatic, normocephalic. Oropharynx and nasopharynx clear.  NECK:  Supple, no jugular venous distention. No thyroid enlargement, no tenderness. Palpation of thoracic spine reveals tenderness in lower thoracic area, lumbar area, but not in chest LUNGS: Normal breath sounds bilaterally, no wheezing, rales,rhonchi or crepitation. No use of accessory muscles of respiration.  CARDIOVASCULAR: S1, S2 normal.  No  murmurs, rubs, or gallops.  ABDOMEN: Soft, tender diffusely, mostly in the right side, liver edge is hard, tender to touch. Abdomen is nondistended. Bowel sounds present. No organomegaly or mass.  EXTREMITIES: No pedal edema, cyanosis, or clubbing.  NEUROLOGIC: Cranial nerves II through XII are intact. Muscle strength 5/5 in all extremities. Sensation intact. Gait not checked.  PSYCHIATRIC: The patient is alert and oriented x 3.  SKIN: No obvious rash, lesion, or ulcer.   ORDERS/RESULTS REVIEWED:   CBC  Recent Labs Lab 02/12/17 1239 02/13/17 1916 02/14/17 0513  02/16/17 0128 02/16/17 0942 02/16/17 2009 02/17/17 0412 02/17/17 1322  WBC 8.2 8.8 8.6  --   --   --   --   --   --   HGB 13.4 10.7* 9.6*  < > 10.7* 8.2* 8.4* 8.4* 9.1*  HCT 40.5 32.6* 28.7*  --   --   --   --   --   --   PLT 155 165 156  --   --   --   --   --   --   MCV 89.9 89.5 89.8  --   --   --   --   --   --   MCH 29.7 29.5 30.0  --   --   --   --   --   --   MCHC 33.1 32.9 33.4  --   --   --   --   --   --   RDW 17.5* 17.4* 17.1*  --   --   --   --   --   --   < > = values in this interval not displayed. ------------------------------------------------------------------------------------------------------------------  Chemistries   Recent Labs Lab 02/12/17 0945 02/13/17 6269 02/13/17 1916 02/14/17 0036 02/14/17 0513 02/15/17 0918 02/15/17 1724 02/17/17 0412 02/17/17 1322  NA 128* 131* 131*  --  130* 134*  --  136  --   K 4.8 3.8 5.0  --  4.5  --   --  3.9  --   CL 94* 99* 95*  --  100*  --   --  108  --   CO2 26 25 25   --  24  --   --  22  --   GLUCOSE 204* 166* 168*  --  146*  --   --  127*  --   BUN 21* 31* 41*  --  49*  --   --  25*  --   CREATININE 0.53 0.61 0.75  --  0.70  --   --  0.50  --   CALCIUM 8.9 8.3* 8.5*  --  8.2*  --   --  8.3*  --   MG  --   --   --  2.0  --   --  2.1  --  2.1  AST 169* 169* 169*  --  155*  --   --   --   --   ALT 89* 80* 79*  --  72*  --   --   --   --     ALKPHOS 995* 861* 828*  --  734*  --   --   --   --   BILITOT 14.4* 12.5* 12.3*  --  11.0*  --   --   --   --    ------------------------------------------------------------------------------------------------------------------ estimated creatinine clearance is 70.3 mL/min (by C-G formula based on SCr of 0.5 mg/dL). ------------------------------------------------------------------------------------------------------------------ No results for input(s): TSH, T4TOTAL, T3FREE,  THYROIDAB in the last 72 hours.  Invalid input(s): FREET3  Cardiac Enzymes  Recent Labs Lab 02/14/17 0036 02/14/17 0513 02/14/17 1119  TROPONINI 0.05* 0.06* 0.08*   ------------------------------------------------------------------------------------------------------------------ Invalid input(s): POCBNP ---------------------------------------------------------------------------------------------------------------  RADIOLOGY: Nm Gi Blood Loss  Result Date: 02/16/2017 CLINICAL DATA:  66 year old female with rectal bleeding for 1 day. History of metastatic breast cancer. EXAM: NUCLEAR MEDICINE GASTROINTESTINAL BLEEDING SCAN TECHNIQUE: Sequential abdominal images were obtained following intravenous administration of Tc-36m labeled red blood cells. RADIOPHARMACEUTICALS:  22.6 mCi Tc-79m in-vitro labeled red cells. COMPARISON:  None. FINDINGS: No abnormal areas of activity are identified to suggest active GI bleeding. No significant abnormalities noted. IMPRESSION: No evidence of active GI bleeding. Electronically Signed   By: Margarette Canada M.D.   On: 02/16/2017 19:06   Dg Chest Port 1 View  Result Date: 02/17/2017 CLINICAL DATA:  Follow-up pleural effusion. History of breast malignancy, coronary artery disease and MI, lung malignancy. EXAM: PORTABLE CHEST 1 VIEW COMPARISON:  Portable chest x-ray of February 15, 2017 FINDINGS: There remains a small left pleural effusion. There may have been minimal re-accumulation of fluid  since the earlier thoracentesis. There is no pneumothorax. The left hemidiaphragm remains obscured. The right lung is clear. The heart and pulmonary vascularity are normal. There is calcification in the wall of the aortic arch. The porta catheter tip projects over the proximal SVC. IMPRESSION: Small left pleural effusion which has slightly increased in volume since the study of 2 days ago. Stable density at the left lung base. Thoracic aortic atherosclerosis. Electronically Signed   By: David  Martinique M.D.   On: 02/17/2017 13:32    EKG:  Orders placed or performed during the hospital encounter of 02/13/17  . 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 Acute posthemorrhagic anemia, hemoglobin level remains stable, although patient has intermittent gastrointestinal bleeding, transfuse as needed #2. Gastrointestinal bleed thought to be due to peptic ulcer disease due to [facility] powders or sphincterostomy site at ERCP due to [facility] powders, continue Protonix intravenously twice daily, clear liquid diet, appreciate gastroenterology's input, bleeding has continued , bleeding scan was negative 02/16/2017 #3. Intractable back pain , no metastasis to the bone per bone scan, , but broken ribs on the right side,appreciate oncologist input, continue pain medications, no significant help with Lidoderm patch or  K pad #4. Hyponatremia due to SIADH, initiated on  fluid restrictions, sodium level normalized,  follow intermittently #5. Malnutrition, hypoalbuminemia, appreciate dietitian's  input,  palliative care consultation is appreciated, Remeron is to be continued , started by psychiatrist to help with appetite, mood, sleep #6. Elevated transaminases due to infiltrative disease, seems to be stable, supportive therapy. My patient's ammonia level was high due to hepatic injury, continue lactulose as patient has hepatic encephalopathy signs  #7. Demand ischemia, hypotension, relative hypoxia, was initially concerning for pulmonary embolism, however chest x-ray revealed left pleural effusion, status post left thoracentesis 02/15/2017. Repeated chest x-ray did not show significant fluid collection, palliative care discussed with the patient and initially plan was to discharge patient home with hospice, however,  patient refused and wants to go to skilled nursing facility, social worker consultation is requested for placement , likely discharging tomorrow or day after tomorrow, depending on bleeding issues #8. SVT, magnesium level was normal, suspected due to large pleural effusion, albuterol inhaler, status post left  thoracentesis With about 1 L of fluid removed, heart rate remained stable, in sinus rhythm until today, patient developed 10 beat run V. tach episode, relatively asymptomatic, however, magnesium level was normal, initiate low-dose of metoprolol, echocardiogram 2 days ago revealed normal ejection fraction, small left pleural effusion, pericardial effusion, no tamponade  Management plans discussed with the patient, family and they are in agreement.   DRUG ALLERGIES:  Allergies  Allergen Reactions  . Penicillins Swelling    rash    CODE STATUS:     Code Status Orders        Start     Ordered   02/13/17 2323  Full code  Continuous     02/13/17 2322    Code Status History    Date Active Date Inactive Code Status Order ID Comments User Context   02/09/2017  4:22 AM 02/13/2017  5:01 PM Full Code [id number]  Harrie Foreman, MD Inpatient   03/02/2015  3:42 PM 03/05/2015  5:21 PM  Full Code [id number]  Idelle Crouch, MD Inpatient   02/27/2015  4:48 PM 02/28/2015 11:06 AM Full Code [id number]  Theodoro Grist, MD Inpatient      TOTAL TIME TAKING CARE OF THIS PATIENT: 40 minutes.    Theodoro Grist M.D on 02/17/2017 at 4:52 PM  Between 7am to 6pm - Pager - [phone]  After 6pm go to [email or web address] - password [email or web address] [facility]  [facility]  Office  [phone]  CC: Primary care physician; Lorelee Market, MD

## 2017-02-18 DIAGNOSIS — D649 Anemia, unspecified: Secondary | ICD-10-CM

## 2017-02-18 DIAGNOSIS — R740 Nonspecific elevation of levels of transaminase and lactic acid dehydrogenase [LDH]: Secondary | ICD-10-CM

## 2017-02-18 DIAGNOSIS — J90 Pleural effusion, not elsewhere classified: Secondary | ICD-10-CM

## 2017-02-18 DIAGNOSIS — E46 Unspecified protein-calorie malnutrition: Secondary | ICD-10-CM

## 2017-02-18 DIAGNOSIS — I499 Cardiac arrhythmia, unspecified: Secondary | ICD-10-CM

## 2017-02-18 DIAGNOSIS — K7200 Acute and subacute hepatic failure without coma: Secondary | ICD-10-CM

## 2017-02-18 DIAGNOSIS — J910 Malignant pleural effusion: Secondary | ICD-10-CM

## 2017-02-18 DIAGNOSIS — D62 Acute posthemorrhagic anemia: Secondary | ICD-10-CM

## 2017-02-18 DIAGNOSIS — K922 Gastrointestinal hemorrhage, unspecified: Secondary | ICD-10-CM

## 2017-02-18 DIAGNOSIS — R7401 Elevation of levels of liver transaminase levels: Secondary | ICD-10-CM

## 2017-02-18 DIAGNOSIS — R5383 Other fatigue: Secondary | ICD-10-CM

## 2017-02-18 DIAGNOSIS — E222 Syndrome of inappropriate secretion of antidiuretic hormone: Secondary | ICD-10-CM

## 2017-02-18 DIAGNOSIS — I248 Other forms of acute ischemic heart disease: Secondary | ICD-10-CM

## 2017-02-18 DIAGNOSIS — K7682 Hepatic encephalopathy: Secondary | ICD-10-CM

## 2017-02-18 LAB — PROTIME-INR
INR: 1.21
PROTHROMBIN TIME: 15.4 s — AB (ref 11.4–15.2)

## 2017-02-18 LAB — GLUCOSE, CAPILLARY
Glucose-Capillary: 152 mg/dL — ABNORMAL HIGH (ref 65–99)
Glucose-Capillary: 117 mg/dL — ABNORMAL HIGH (ref 65–99)
Glucose-Capillary: 156 mg/dL — ABNORMAL HIGH (ref 65–99)

## 2017-02-18 LAB — HEMOGLOBIN: Hemoglobin: 8.3 g/dL — ABNORMAL LOW (ref 12.0–16.0)

## 2017-02-18 MED ORDER — DIAZEPAM 5 MG PO TABS: 5.0000 mg | ORAL_TABLET | Freq: Two times a day (BID) | ORAL | 0 refills | Status: AC | PRN

## 2017-02-18 MED ORDER — PANTOPRAZOLE SODIUM 40 MG PO TBEC: 40.0000 mg | DELAYED_RELEASE_TABLET | Freq: Two times a day (BID) | ORAL | 3 refills | Status: AC

## 2017-02-18 MED ORDER — ONDANSETRON HCL 4 MG PO TABS: 4.0000 mg | ORAL_TABLET | Freq: Four times a day (QID) | ORAL | 0 refills | Status: AC | PRN

## 2017-02-18 MED ORDER — NICOTINE 21 MG/24HR TD PT24: 21.0000 mg | MEDICATED_PATCH | Freq: Every day | TRANSDERMAL | 0 refills | Status: AC

## 2017-02-18 MED ORDER — METOPROLOL TARTRATE 25 MG PO TABS: 25.0000 mg | ORAL_TABLET | Freq: Two times a day (BID) | ORAL | 2 refills | Status: AC

## 2017-02-18 MED ORDER — OXYCODONE HCL 5 MG PO TABS: 5.0000 mg | ORAL_TABLET | ORAL | 0 refills | Status: AC | PRN

## 2017-02-18 MED ORDER — MIRTAZAPINE 15 MG PO TABS: 15.0000 mg | ORAL_TABLET | Freq: Every day | ORAL | 3 refills | Status: AC

## 2017-02-18 NOTE — Progress Notes (Signed)
Patient refuses telemetry monitor, MD notified.

## 2017-02-18 NOTE — NC FL2 (Signed)
[place] LEVEL OF CARE SCREENING TOOL     IDENTIFICATION  Patient Name: Dawn Allen Birthdate: [birth year] Sex: female Admission Date (Current Location): 02/13/2017  [place] and [place] Number:  Engineering geologist and Address:  [facility], [address], [place], [place] [address]      Provider Number: [id number]  Attending Physician Name and Address:  Theodoro Grist, MD  Relative Name and Phone Number:       Current Level of Care: Hospital Recommended Level of Care: [place] Prior Approval Number:    Date Approved/Denied:   PASRR Number:  [id number])  Discharge Plan: SNF    Current Diagnoses: Patient Active Problem List   Diagnosis Date Noted  . Malignant pleural effusion 02/18/2017  . Arrhythmia 02/18/2017  . Acute posthemorrhagic anemia 02/18/2017  . SIADH (syndrome of inappropriate ADH production) ([place]) 02/18/2017  . Elevated transaminase level 02/18/2017  . Acute hepatic encephalopathy 02/18/2017  . Malnutrition ([place]) 02/18/2017  . Demand ischemia ([place]) 02/18/2017  . 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 Palliative care by specialist   . Advance care planning   . Goals of care, counseling/discussion   . GI bleed   . Metastatic breast cancer ([place])   . Intractable back pain 02/13/2017  . Liver metastases ([place])   . Carcinoma of overlapping sites of right breast in female, estrogen receptor negative ([place]) 02/17/2016  . Thyroid nodule 04/29/2015  . 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 Generalized weakness 03/02/2015  . Coronary artery disease   . Hypertension   . HLD (hyperlipidemia)   . Diabetic neuropathy ([place]) 05/31/2013    Orientation RESPIRATION BLADDER Height & Weight     Self, Time, Situation, Place  Normal Continent Weight: 161 lb 6.4 oz (73.2 kg) Height:  5\' 5"  (165.1 cm)  BEHAVIORAL SYMPTOMS/MOOD NEUROLOGICAL BOWEL  NUTRITION STATUS   (none) Convulsions/Seizures Continent Diet (Regualr Diet )  AMBULATORY STATUS COMMUNICATION OF NEEDS Skin   Extensive Assist Verbally Normal                       Personal Care Assistance Level of Assistance  Bathing, Feeding, Dressing Bathing Assistance: Limited assistance Feeding assistance: Independent Dressing Assistance: Limited assistance     Functional Limitations Info  Sight, Hearing, Speech Sight Info: Adequate Hearing Info: Adequate Speech Info: Adequate    SPECIAL CARE FACTORS FREQUENCY  PT (By licensed PT), OT (By licensed OT)     PT Frequency:  (5) OT Frequency:  (5)            Contractures      Additional Factors Info  Code Status, Allergies, Insulin Sliding Scale Code Status Info:  (Full Code. ) Allergies Info:  (Penicillins)   Insulin Sliding Scale Info:  (NovoLog Insulin Injections. )       Current Medications (02/18/2017):  This is the current hospital active medication list Current Facility-Administered Medications  Medication Dose Route Frequency Provider Last Rate Last Dose  . albuterol (PROVENTIL) (2.5 MG/3ML) 0.083% nebulizer solution 2.5 mg  2.5 mg Nebulization Q6H PRN Hugelmeyer, Alexis, DO   2.5 mg at 02/18/17 1036  . dextrose 5 % and 0.9 % NaCl with KCl 20 mEq/L infusion   Intravenous Continuous Theodoro Grist, MD 75 mL/hr at 02/18/17 0400    . diazepam (VALIUM) tablet 5 mg  5 mg Oral BID PRN Hugelmeyer, Alexis, DO      . feeding  supplement (BOOST / RESOURCE BREEZE) liquid 1 Container  1 Container Oral TID BM Theodoro Grist, MD   1 Container at 02/17/17 2127  . gi cocktail (Maalox,Lidocaine,Donnatal)  30 mL Oral TID PRN Hugelmeyer, Alexis, DO   30 mL at 02/13/17 2230  . guaiFENesin (MUCINEX) 12 hr tablet 600 mg  600 mg Oral BID Theodoro Grist, MD   600 mg at 02/18/17 1019  . HYDROmorphone (DILAUDID) injection 0.5 mg  0.5 mg Intravenous Q3H PRN Earlie Counts, NP   0.5 mg at 02/18/17 2979  . insulin aspart (novoLOG)  injection 0-15 Units  0-15 Units Subcutaneous TID WC Hugelmeyer, Alexis, DO   3 Units at 02/18/17 1016  . insulin aspart (novoLOG) injection 0-5 Units  0-5 Units Subcutaneous QHS Hugelmeyer, Alexis, DO      . lactulose (CHRONULAC) 10 GM/15ML solution 20 g  20 g Oral BID Theodoro Grist, MD   Stopped at 02/15/17 1430  . lidocaine (LIDODERM) 5 % 2 patch  2 patch Transdermal Q24H Theodoro Grist, MD   2 patch at 02/17/17 [phone]  . lidocaine-prilocaine (EMLA) cream 1 application  1 application Topical PRN Hugelmeyer, Alexis, DO      . linagliptin (TRADJENTA) tablet 5 mg  5 mg Oral Daily Hugelmeyer, Alexis, DO   5 mg at 02/18/17 1032  . lisinopril (PRINIVIL,ZESTRIL) tablet 20 mg  20 mg Oral Daily Hugelmeyer, Alexis, DO   20 mg at 02/18/17 1019  . metoprolol tartrate (LOPRESSOR) tablet 25 mg  25 mg Oral BID Theodoro Grist, MD   25 mg at 02/18/17 1019  . mirtazapine (REMERON) tablet 15 mg  15 mg Oral QHS Lockie Bothun, MD      . nicotine (NICODERM CQ - dosed in mg/24 hours) patch 21 mg  21 mg Transdermal Daily Theodoro Grist, MD   21 mg at 02/18/17 1025  . nitroGLYCERIN (NITROSTAT) SL tablet 0.4 mg  0.4 mg Sublingual Q5 min PRN Hugelmeyer, Alexis, DO      . ondansetron (ZOFRAN) tablet 4 mg  4 mg Oral Q6H PRN Hugelmeyer, Alexis, DO   4 mg at 02/17/17 1354   Or  . ondansetron (ZOFRAN) injection 4 mg  4 mg Intravenous Q6H PRN Hugelmeyer, Alexis, DO   4 mg at 02/14/17 1104  . oxyCODONE (Oxy IR/ROXICODONE) immediate release tablet 5 mg  5 mg Oral Q4H PRN Theodoro Grist, MD   5 mg at 02/17/17 1941  . pantoprazole (PROTONIX) injection 40 mg  40 mg Intravenous Q12H Theodoro Grist, MD   40 mg at 02/18/17 1025  . phenytoin (DILANTIN) ER capsule 300 mg  300 mg Oral Daily Hugelmeyer, Alexis, DO   300 mg at 02/18/17 1031  . zolpidem (AMBIEN) tablet 5 mg  5 mg Oral QHS PRN Hugelmeyer, Alexis, DO       Facility-Administered Medications Ordered in Other Encounters  Medication Dose Route Frequency Provider Last Rate Last  Dose  . albuterol (PROVENTIL) (2.5 MG/3ML) 0.083% nebulizer solution 2.5 mg  2.5 mg Nebulization Once Nestor Lewandowsky, MD      . sodium chloride flush (NS) 0.9 % injection 10 mL  10 mL Intravenous PRN Cammie Sickle, MD   10 mL at 12/01/15 0850     Discharge Medications: Please see discharge summary for a list of discharge medications.  Relevant Imaging Results:  Relevant Lab Results:   Additional Information  (SSN: [id number])  Sample, [name], LCSW

## 2017-02-18 NOTE — Care Management Important Message (Signed)
Important Message  Patient Details  Name: Dawn Allen MRN: [id number] Date of Birth: [birth year]   Medicare Important Message Given:  Yes    Shelbie Ammons, RN 02/18/2017, 8:09 AM

## 2017-02-18 NOTE — Clinical Social Work Placement (Signed)
   CLINICAL SOCIAL WORK PLACEMENT  NOTE  Date:  02/18/2017  Patient Details  Name: Dawn Allen MRN: [id number] Date of Birth: February 23, 1951  Clinical Social Work is seeking post-discharge placement for this patient at the [place] level of care (*CSW will initial, date and re-position this form in  chart as items are completed):  Yes   Patient/family provided with [facility] Work Department's list of facilities offering this level of care within the geographic area requested by the patient (or if unable, by the patient's family).  Yes   Patient/family informed of their freedom to choose among providers that offer the needed level of care, that participate in Medicare, Medicaid or managed care program needed by the patient, have an available bed and are willing to accept the patient.  Yes   Patient/family informed of 's ownership interest in [facility] and [facility], as well as of the fact that they are under no obligation to receive care at these facilities.  PASRR submitted to EDS on 02/15/17     PASRR number received on 02/15/17     Existing PASRR number confirmed on       FL2 transmitted to all facilities in geographic area requested by pt/family on 02/16/17     FL2 transmitted to all facilities within larger geographic area on       Patient informed that his/her managed care company has contracts with or will negotiate with certain facilities, including the following:        Yes   Patient/family informed of bed offers received.  Patient chooses bed at  [facility] )     Physician recommends and patient chooses bed at      Patient to be transferred to  [facility] ) on 02/18/17.  Patient to be transferred to facility by  [facility] )     Patient family notified on 02/18/17 of transfer.  Name of family member notified:   (CSW left patient's daughter [name] a voicemail. )     PHYSICIAN        Additional Comment:    _______________________________________________ Oliver Neuwirth, [name], LCSW 02/18/2017, 2:09 PM

## 2017-02-18 NOTE — Progress Notes (Signed)
Patient is medically stable for D/C to [facility] today. Per Neoma Laming admissions coordinator at [facility] patient can come today to room 305. RN will call report to [facility] and arrange EMS for transport. Palliative will follow, [facility] hospice liaison is aware of above. Clinical Education officer, museum (CSW) sent D/C orders to [employer] via [employer]. Patient is aware of above. Patient requested for CSW to call her daughter [name] and make her aware of above. CSW attempted to contact [name] however she did not answer and a voicemail was left. Please reconsult if future social work needs arise. CSW signing off.   [employer], LCSW [phone]

## 2017-02-18 NOTE — Progress Notes (Signed)
Patient is d/ced to [facility] and will be followed by Palliative Medicine.  Patient hasn't had bloody BM this shift.  Hgb is stable at 8.3 up from 7.8.  Patient is having increasing weakness and poor PO intake.  She was advanced to Full Liquid diet.  Continues to have back pain - gave 0.5 mg dilaudid today 1x.  She is s/p Thoracentesis this week for L malignant pleural effusion when they drew off 1L of amber liquid.  Last week she had biliary stent placed.  GI consult believes that GI bleed may be from sphincterotomy or pt's use of Goody's powders.  Patient has had extensive conversations with Palliative medicine and her family for plan of care.  She's seen Oncology which states she's not eligible for chemo b/c of health status.  She had Psych consult because she's been withdrawn and depressed.  RN removed IVs.  Report called to [place] at [facility].  Patient will be going to Rm 305.  EMS called for transport.

## 2017-02-18 NOTE — Discharge Summary (Signed)
[facility] NAME: Dawn Allen    MR#:  [id number]  DATE OF BIRTH:  10/07/1950  DATE OF ADMISSION:  02/13/2017 ADMITTING PHYSICIAN: Dawn Glassing Hugelmeyer, DO  DATE OF DISCHARGE: No discharge date for patient encounter.  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 Anxiety   . Breast cancer ([place]) 08/2015   right breast, chemo  . Cancer of right female breast ([place])   . Coronary artery disease    a. NSTEMI cath 08/02/14: LM nl, pLAD 50%, mLCx 30%, mRCA 95% s/p PCI/DES, 2nd lesion 40%, EF 55%  . Diabetes mellitus without complication ([place])   . DVT (deep venous thrombosis) (HCC)    LEFT LEG   . HLD (hyperlipidemia)   . Hypertension   . Lung cancer ([place])   . Lung nodule   . MI (myocardial infarction) ([place])   . Seizure disorder ([place])   . Seizures ([place])   . Squamous cell lung cancer ([place]) 04/23/2015    .pro HOSPITAL COURSE:   The patient is 66 year old female with history of metastatic breast cancer, who presents to the hospital with complaints of severe back pain. She has known  metastatic breast cancer with radiation to liver and bone. She was recently seen by her primary oncologist Dr. Mike Allen, who recommended a bone scan, which was not yet performed.. Patient returned back due to significant pain in mid thoracic,  lower thoracic area and related to movements. The patient had a whole-body bone scan, which was negative for metastatic disease, but subacute fractures of anterior right sixth and seventh ribs were found, which were felt to be culprit of pain. While in the hospital, the patient developed rectal bleeding, requiring transfusion. The patient was seen by gastroenterologist, who acknowledges she is poor candidate for invasive evaluations, conservative therapy was recommended. Patient was initiated on intravenous Protonix and was intermittently continued on clear liquid diet which was advanced to full liquids. If she tolerates diet well, she will likely be discharged to skilled nursing facility to continue Protonix orally twice a day and advance her diet as tolerated.. The patient was evaluated by oncologist, who stopped her chemotherapy, recommended palliative care, hospice, to which patient was not very much receptive. She chose rehabilitation facility instead. The patient was seen by psychiatrist, who recommended  Remeron for sleep and appetite, however, did not feel that patient has significant depressive symptoms. The patient was noted to have decreased breath sounds on the left side of the lungs, x-ray revealed a pleural effusion, she underwent left paracentesis of about 1 L 02/15/2017 , cytology was positive for adenocarcinoma,. Patient had  a few episodes of SVT as well as short episode of V. tach in the hospital, she was initiated on metoprolol.  Patient underwent bleeding scan due to intermittent gastrointestinal bleed on 02/16/2017, which was negative. Patient continues to bleed, having black to maroon stools. Palliative care as outpatient and recommended palliative care  follow up Discussion by problem:. #1. Acute posthemorrhagic anemia, hemoglobin level remains stable, patient continues to have  intermittent gastrointestinal bleeding, continue iron supplementation, off Coumadin #2. Gastrointestinal bleed thought to be due to peptic ulcer disease due to [place] powders or sphincterostomy site at ERCP due to [place] powders, continue Protonix orally twice daily, full liquid diet,  bleeding scan was negative 02/16/2017, patient was seen by gastroenterologist, however, was felt to be not candidate for interventions. #3. Intractable back pain , no metastasis to the bone per bone scan, , but broken ribs on the right side,appreciate oncologist input, continue pain medications, no significant help with Lidoderm patch or  K pad #4. Hyponatremia due to SIADH, initiated on  fluid restrictions, sodium level normalized,  follow intermittently as outpatient #5. Malnutrition, hypoalbuminemia, appreciate dietitian's  input, palliative care consultation is appreciated, Remeron is to be continued , started by psychiatrist to help with appetite, mood, sleep #6. Elevated transaminases due to infiltrative disease, stable, supportive therapy. Ammonia level was high due to hepatic  infiltrative disease, the patient is to continue  lactulose for  hepatic encephalopathy  #7. Demand ischemia, hypotension, relative hypoxia, was initially concerning for pulmonary embolism, however chest x-ray revealed left pleural effusion, status post left thoracentesis 02/15/2017. pathology revealed adenocarcinoma. Repeated chest x-ray did not show significant fluid collection, palliative care discussed with the patient and initially plan was to discharge patient home with hospice, however, patient refused and wants to go to skilled nursing facility, social worker consultation was requested for placement , discharge to skilled nursing facility today with palliative care follow up  #8. SVT,  Nonsustained V. Tach, ,  initiated on  low-dose of metoprolol, echocardiogram 2 days ago revealed normal ejection fraction, small left pleural effusion, pericardial effusion, no tamponade  #9 metastatic breast cancer, triple negative, progressive on Taxol, declining performance status, oncologist discontinued further Chemotherapy and recommended palliative care and hospice follow-up as outpatient  #10. Generalized weakness, patient is to continue physical therapy as able, palliative care follow up as outpatient   CONSULTS OBTAINED:  Treatment Team:  Cammie Sickle, MD Hildred Priest, MD  DRUG ALLERGIES:   Allergies  Allergen Reactions  . Penicillins Swelling    rash    DISCHARGE MEDICATIONS:   Current Discharge Medication List    START taking these medications   Details  metoprolol tartrate (LOPRESSOR) 25 MG tablet Take 1 tablet (25 mg total) by mouth 2 (two) times daily. Qty: 60 tablet, Refills: 2    mirtazapine (REMERON) 15 MG tablet Take 1 tablet (15 mg total) by mouth at bedtime. Qty: 30 tablet, Refills: 3    nicotine (NICODERM CQ - DOSED IN MG/24 HOURS) 21 mg/24hr patch Place 1 patch (21 mg total) onto the skin daily. Qty: 28 patch, Refills: 0    ondansetron (ZOFRAN) 4 MG tablet Take 1 tablet (4 mg total) by mouth every 6 (six) hours as needed for nausea. Qty: 20 tablet, Refills: 0    oxyCODONE (OXY IR/ROXICODONE) 5 MG immediate release tablet Take 1 tablet (5 mg total) by mouth every 4 (four) hours as needed for moderate pain, severe pain or breakthrough pain. Qty: 30 tablet, Refills: 0  pantoprazole (PROTONIX) 40 MG tablet Take 1 tablet (40 mg total) by mouth 2 (two) times daily. Qty: 60 tablet, Refills: 3      CONTINUE these medications which have CHANGED   Details  diazepam (VALIUM) 5 MG tablet Take 1 tablet (5 mg total) by mouth 2 (two) times daily as needed. Reported on 12/16/2015 Qty: 30 tablet, Refills: 0   Associated Diagnoses: Anxiety state      CONTINUE  these medications which have NOT CHANGED   Details  feeding supplement, ENSURE ENLIVE, (ENSURE ENLIVE) LIQD Take 237 mLs by mouth 2 (two) times daily between meals. Qty: 60 Bottle, Refills: 0    lactulose (CHRONULAC) 10 GM/15ML solution Take 30 mLs (20 g total) by mouth daily as needed for mild constipation. Qty: 946 mL, Refills: 0   Associated Diagnoses: Carcinoma of overlapping sites of right breast in female, estrogen receptor negative ([place]); Liver mass; Bone pain; Hyperbilirubinemia; Alkaline phosphatase elevation; Cancer related pain    lidocaine-prilocaine (EMLA) cream Apply 1 application topically as needed. Apply to port a cath site 1 hour before chemotherapy treatment. Qty: 30 g, Refills: 6   Associated Diagnoses: Portacath in place; Carcinoma of overlapping sites of right breast in female, estrogen receptor negative (HCC)    nitroGLYCERIN (NITROSTAT) 0.4 MG SL tablet Place 0.4 mg under the tongue as needed.   Associated Diagnoses: Carcinoma of overlapping sites of right breast in female, estrogen receptor negative ([place]); Liver mass; Bone pain; Hyperbilirubinemia; Alkaline phosphatase elevation; Cancer related pain    phenytoin (DILANTIN) 100 MG ER capsule Take 300 mg by mouth daily.     TRADJENTA 5 MG TABS tablet Take 5 mg by mouth daily.   Associated Diagnoses: Carcinoma of overlapping sites of right breast in female, estrogen receptor negative ([place]); Liver mass; Bone pain; Hyperbilirubinemia; Alkaline phosphatase elevation; Cancer related pain    VENTOLIN HFA 108 (90 Base) MCG/ACT inhaler Inhale 1 puff into the lungs every 6 (six) hours as needed. Reported on 01/20/2016   Associated Diagnoses: Malignant neoplasm of right female breast, unspecified site of breast; Chemotherapy induced nausea and vomiting      STOP taking these medications     empagliflozin (JARDIANCE) 25 MG TABS tablet      HYDROcodone-acetaminophen (NORCO/VICODIN) 5-325 MG tablet      lisinopril  (PRINIVIL,ZESTRIL) 20 MG tablet      warfarin (COUMADIN) 3 MG tablet          , The patient is to follow-up with If you experience wor her primary care physiciansening of your admission symptoms, develop shortness of breath, life threatening emergency, suicidal or homicidal thoughts you must seek medical attention immediately by calling 911 or calling your MD immediately  if symptoms less severe.  You Must read complete instructions/literature along with all the possible adverse reactions/side effects for all the Medicines you take and that have been prescribed to you. Take any new Medicines after you have completely understood and accept all the possible adverse reactions/side effects.   Please note  You were cared for by a hospitalist during your hospital stay. If you have any questions about your discharge medications or the care you received while you were in the hospital after you are discharged, you can call the unit and asked to speak with the hospitalist on call if the hospitalist that took care of you is not available. Once you are discharged, your primary care physician will handle any further medical issues. Please note that NO REFILLS for any discharge medications  will be authorized once you are discharged, as it is imperative that you return to your primary care physician (or establish a relationship with a primary care physician if you do not have one) for your aftercare needs so that they can reassess your need for medications and monitor your lab values.    Today   CHIEF COMPLAINT:   Chief Complaint  Patient presents with  . Back Pain    HISTORY OF PRESENT ILLNESS:     VITAL SIGNS:  Blood pressure 124/60, pulse 89, temperature 98.1 F (36.7 C), temperature source Oral, resp. rate 20, height 5\' 5"  (1.651 m), weight 73.2 kg (161 lb 6.4 oz), SpO2 92 %.  I/O:   Intake/Output Summary (Last 24 hours) at 02/18/17 1138 Last data filed at 02/17/17 1655  Gross per 24  hour  Intake               75 ml  Output                0 ml  Net               75 ml    PHYSICAL EXAMINATION:  GENERAL:  66 y.o.-year-old patient lying in the bed with no acute distress.  EYES: Pupils equal, round, reactive to light and accommodation. No scleral icterus. Extraocular muscles intact.  HEENT: Head atraumatic, normocephalic. Oropharynx and nasopharynx clear.  NECK:  Supple, no jugular venous distention. No thyroid enlargement, no tenderness.  LUNGS: Normal breath sounds bilaterally, no wheezing, rales,rhonchi or crepitation. No use of accessory muscles of respiration.  CARDIOVASCULAR: S1, S2 normal. No murmurs, rubs, or gallops.  ABDOMEN: Soft, non-tender, non-distended. Bowel sounds present. No organomegaly or mass.  EXTREMITIES: No pedal edema, cyanosis, or clubbing.  NEUROLOGIC: Cranial nerves II through XII are intact. Muscle strength 5/5 in all extremities. Sensation intact. Gait not checked.  PSYCHIATRIC: The patient is alert and oriented x 3.  SKIN: No obvious rash, lesion, or ulcer.   DATA REVIEW:   CBC  Recent Labs Lab 02/14/17 0513  02/18/17 0319  WBC 8.6  --   --   HGB 9.6*  < > 8.3*  HCT 28.7*  --   --   PLT 156  --   --   < > = values in this interval not displayed.  Chemistries   Recent Labs Lab 02/14/17 0513  02/17/17 0412 02/17/17 1322  NA 130*  < > 136  --   K 4.5  --  3.9  --   CL 100*  --  108  --   CO2 24  --  22  --   GLUCOSE 146*  --  127*  --   BUN 49*  --  25*  --   CREATININE 0.70  --  0.50  --   CALCIUM 8.2*  --  8.3*  --   MG  --   < >  --  2.1  AST 155*  --   --   --   ALT 72*  --   --   --   ALKPHOS 734*  --   --   --   BILITOT 11.0*  --   --   --   < > = values in this interval not displayed.  Cardiac Enzymes  Recent Labs Lab 02/14/17 1119  TROPONINI 0.08*    Microbiology Results  Results for orders placed or performed during the hospital encounter of 02/13/17  Body fluid culture  Status: None (Preliminary  result)   Collection Time: 02/15/17  4:19 PM  Result Value Ref Range Status   Specimen Description PLEURAL  Final   Special Requests NONE  Final   Gram Stain   Final    RARE WBC PRESENT, PREDOMINANTLY MONONUCLEAR NO ORGANISMS SEEN    Culture   Final    NO GROWTH 2 DAYS Performed at [facility], [address], [place], [place] [address]    Report Status PENDING  Incomplete    RADIOLOGY:  Nm Gi Blood Loss  Result Date: 02/16/2017 CLINICAL DATA:  66 year old female with rectal bleeding for 1 day. History of metastatic breast cancer. EXAM: NUCLEAR MEDICINE GASTROINTESTINAL BLEEDING SCAN TECHNIQUE: Sequential abdominal images were obtained following intravenous administration of Tc-9m labeled red blood cells. RADIOPHARMACEUTICALS:  22.6 mCi Tc-14m in-vitro labeled red cells. COMPARISON:  None. FINDINGS: No abnormal areas of activity are identified to suggest active GI bleeding. No significant abnormalities noted. IMPRESSION: No evidence of active GI bleeding. Electronically Signed   By: Margarette Canada M.D.   On: 02/16/2017 19:06   Dg Chest Port 1 View  Result Date: 02/17/2017 CLINICAL DATA:  Follow-up pleural effusion. History of breast malignancy, coronary artery disease and MI, lung malignancy. EXAM: PORTABLE CHEST 1 VIEW COMPARISON:  Portable chest x-ray of February 15, 2017 FINDINGS: There remains a small left pleural effusion. There may have been minimal re-accumulation of fluid since the earlier thoracentesis. There is no pneumothorax. The left hemidiaphragm remains obscured. The right lung is clear. The heart and pulmonary vascularity are normal. There is calcification in the wall of the aortic arch. The porta catheter tip projects over the proximal SVC. IMPRESSION: Small left pleural effusion which has slightly increased in volume since the study of 2 days ago. Stable density at the left lung base. Thoracic aortic atherosclerosis. Electronically Signed   By: David  Martinique M.D.   On:  02/17/2017 13:32    EKG:   Orders placed or performed during the hospital encounter of 02/13/17  . EKG 12-Lead      Management plans discussed with the patient, family and they are in agreement.  CODE STATUS:     Code Status Orders        Start     Ordered   02/17/17 1622  Do not attempt resuscitation (DNR)  Continuous    Question Answer Comment  In the event of cardiac or respiratory ARREST Do not call a "code blue"   In the event of cardiac or respiratory ARREST Do not perform Intubation, CPR, defibrillation or ACLS   In the event of cardiac or respiratory ARREST Use medication by any route, position, wound care, and other measures to relive pain and suffering. May use oxygen, suction and manual treatment of airway obstruction as needed for comfort.      02/17/17 1621    Code Status History    Date Active Date Inactive Code Status Order ID Comments User Context   02/13/2017 11:22 PM 02/17/2017  4:21 PM Full Code [id number]  [place] DO Inpatient   02/09/2017  4:22 AM 02/13/2017  5:01 PM Full Code [id number]  Harrie Foreman, MD Inpatient   03/02/2015  3:42 PM 03/05/2015  5:21 PM Full Code [id number]  Idelle Crouch, MD Inpatient   02/27/2015  4:48 PM 02/28/2015 11:06 AM Full Code [id number]  Theodoro Grist, MD Inpatient      TOTAL TIME TAKING CARE OF THIS PATIENT: 40 minutes.    [email or web address]  on 02/18/2017 at 11:38 AM  Between 7am to 6pm - Pager - [phone]  After 6pm go to [email or web address] - password [email or web address] [facility]  [facility]  Office  [phone]  CC: Primary care physician; Dawn Market, MD

## 2017-02-18 NOTE — Progress Notes (Signed)
Dawn Allen   DOB:03-07-1951   BJ#:[id number]    Subjective: Patient poor historian.   Patient is upset- given the overall chain of events. States her pain is controlled. Does not want to talk.  ROS: unable to asess.   Objective:  Vitals:   02/17/17 2125 02/18/17 0438  BP: (!) 93/53 (!) 106/55  Pulse: 90 83  Resp:  20  Temp:  98.1 F (36.7 C)     Intake/Output Summary (Last 24 hours) at 02/18/17 0807 Last data filed at 02/17/17 1655  Gross per 24 hour  Intake               75 ml  Output                0 ml  Net               75 ml   GENERAL: Well-nourished well-developed; Alert, no distress and comfortable.   Alone. EYES: positive for jaundice. OROPHARYNX: no thrush or ulceration. NECK: supple, no masses felt LUNGS: decreased breath sounds to auscultation at bases and  No wheeze or crackles HEART/CVS: regular rate & rhythm and no murmurs; No lower extremity edema ABDOMEN: abdomen soft, non-tender and normal bowel sounds Musculoskeletal:no cyanosis of digits and no clubbing  PSYCH: alert & oriented x 3; flat affect NEURO: no focal motor/sensory deficits SKIN:  no rashes or significant lesions   Labs:  Lab Results  Component Value Date   WBC 8.6 02/14/2017   HGB 8.3 (L) 02/18/2017   HCT 28.7 (L) 02/14/2017   MCV 89.8 02/14/2017   PLT 156 02/14/2017   NEUTROABS 3.8 02/03/2017    Lab Results  Component Value Date   NA 136 02/17/2017   K 3.9 02/17/2017   CL 108 02/17/2017   CO2 22 02/17/2017    Studies:  Nm Gi Blood Loss  Result Date: 02/16/2017 CLINICAL DATA:  66 year old female with rectal bleeding for 1 day. History of metastatic breast cancer. EXAM: NUCLEAR MEDICINE GASTROINTESTINAL BLEEDING SCAN TECHNIQUE: Sequential abdominal images were obtained following intravenous administration of Tc-70m labeled red blood cells. RADIOPHARMACEUTICALS:  22.6 mCi Tc-92m in-vitro labeled red cells. COMPARISON:  None. FINDINGS: No abnormal areas of activity are identified  to suggest active GI bleeding. No significant abnormalities noted. IMPRESSION: No evidence of active GI bleeding. Electronically Signed   By: Margarette Canada M.D.   On: 02/16/2017 19:06   Dg Chest Port 1 View  Result Date: 02/17/2017 CLINICAL DATA:  Follow-up pleural effusion. History of breast malignancy, coronary artery disease and MI, lung malignancy. EXAM: PORTABLE CHEST 1 VIEW COMPARISON:  Portable chest x-ray of February 15, 2017 FINDINGS: There remains a small left pleural effusion. There may have been minimal re-accumulation of fluid since the earlier thoracentesis. There is no pneumothorax. The left hemidiaphragm remains obscured. The right lung is clear. The heart and pulmonary vascularity are normal. There is calcification in the wall of the aortic arch. The porta catheter tip projects over the proximal SVC. IMPRESSION: Small left pleural effusion which has slightly increased in volume since the study of 2 days ago. Stable density at the left lung base. Thoracic aortic atherosclerosis. Electronically Signed   By: David  Martinique M.D.   On: 02/17/2017 13:32    Assessment & Plan:  66 year old female patient with metastatic progressive breast cancer- multiple comorbidities; currently admitted to the hospital for generalized weakness   # Metastatic breast cancer- triple negative progressive on Taxol. Declining performance status. Discontinue further chemotherapy. Agree  with palliative care/hospice  # Intermittent GI bleed- acute. Hemoglobin stable around 8. Bleeding scan negative. Poor candidate for any endoscopic procedures. Appreciate GI evaluation.  # Left side large pleural effusion- secondary to malignancy status post thoracentesis.  # Arrhythmia- secondary to underlying malignancy/ electrolyte imbalance. Conservative management  # Prognosis- poor; patient will be best served with hospice at the nursing home. Very much appreciate palliative care evaluation. Agree with DNR/DNI.   Cammie Sickle, MD 02/18/2017  8:07 AM

## 2017-02-18 NOTE — Progress Notes (Signed)
[facility] responded to a referral from MD to see the Pt in room 124. MD stated that the Pt did not want to talk and asked if I could speak with her. [place] agreed. Pt was in a dark room and seemed to be in a dark mood. When asked how she was feeling, however, she replied "Much Better". When asked what made her feel better, she mentioned a visit from a friend. This person is one whom she trusts. This friend gave her an inspirational talk which is motivating her... For today... To look at her situation in a positive light. Pt asked for prayer, which was provided. CH is available for follow up as needed.    02/18/17 1000  Clinical Encounter Type  Visited With Patient  Visit Type Follow-up;Spiritual support  Referral From Physician  Consult/Referral To Chaplain  Spiritual Encounters  Spiritual Needs Prayer;Emotional

## 2017-02-19 LAB — BODY FLUID CULTURE: CULTURE: NO GROWTH

## 2017-02-24 ENCOUNTER — Ambulatory Visit: Payer: Medicare Other | Admitting: Internal Medicine

## 2017-02-25 ENCOUNTER — Ambulatory Visit: Payer: Medicare Other | Admitting: Cardiovascular Disease

## 2017-03-23 DEATH — deceased

## 2017-04-06 ENCOUNTER — Other Ambulatory Visit: Payer: Self-pay | Admitting: Nurse Practitioner

## 2017-04-25 IMAGING — CR DG RIBS 2V*R*
2 series · 2 of 2 positions shown · non-contrast
Comparison: Chest radiograph - 01/29/2016

CLINICAL DATA: Post fall now with right rib pain. History of breast
cancer.

EXAM:
RIGHT RIBS - 2 VIEW

[rib pa]
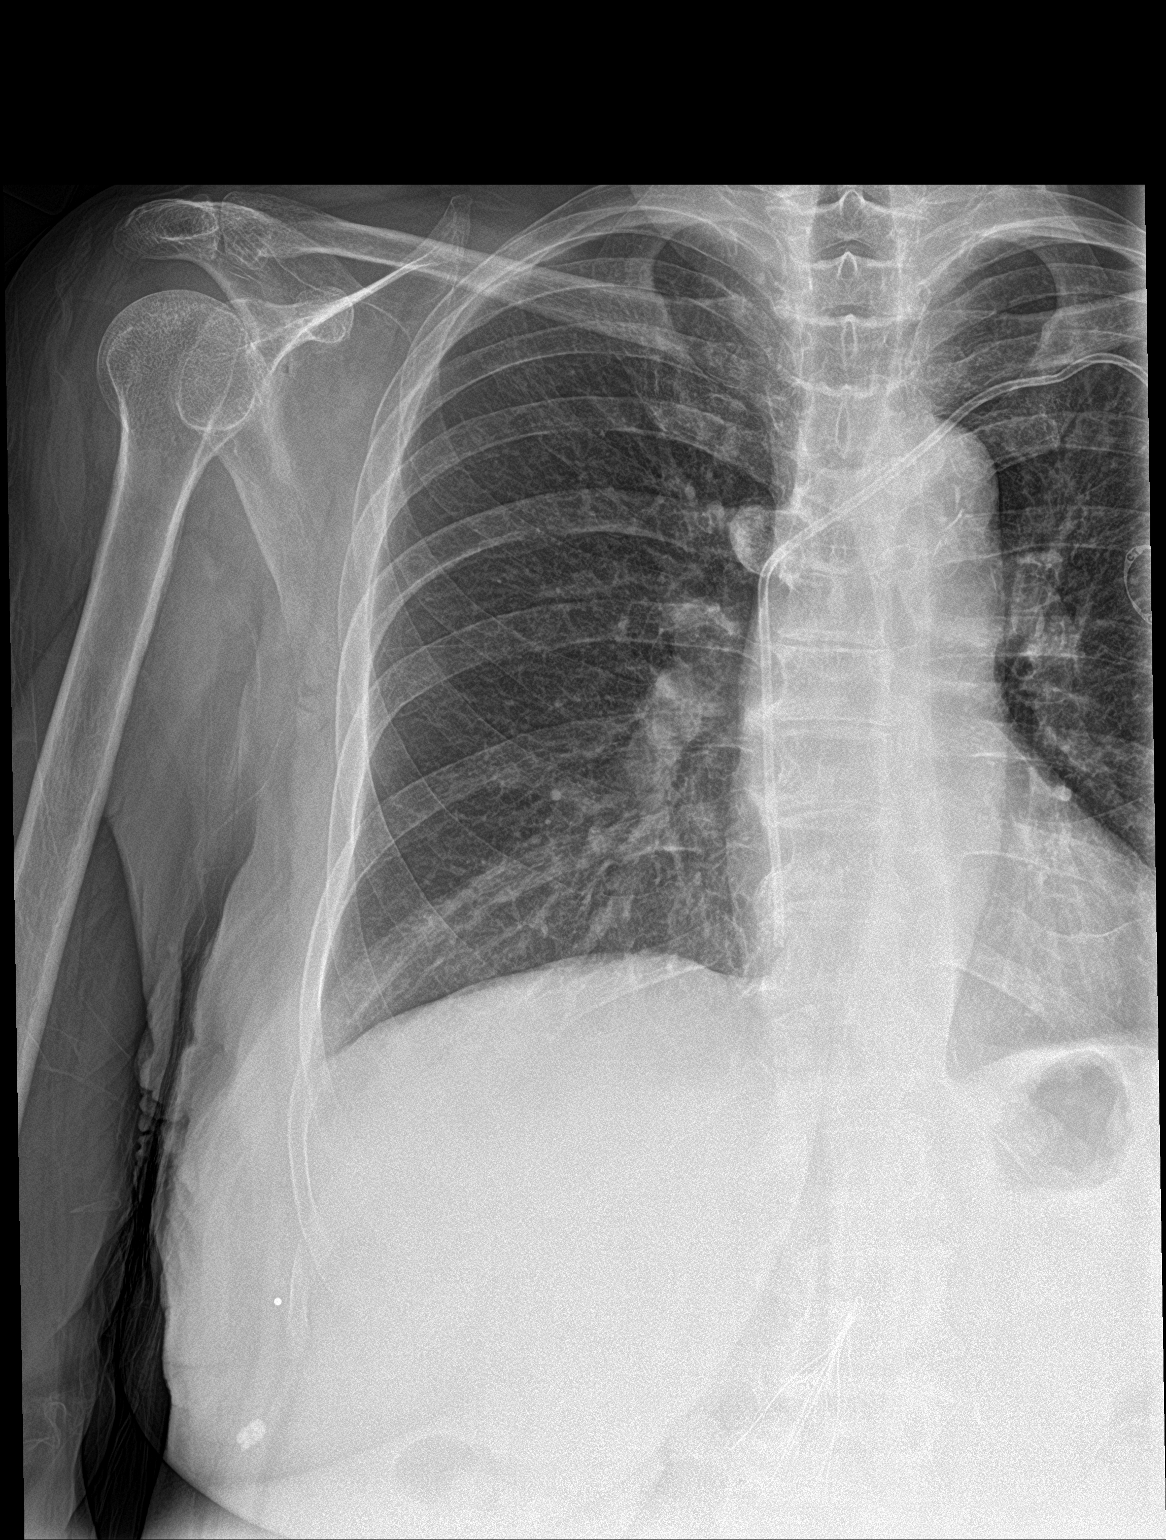

[rib pa obl]
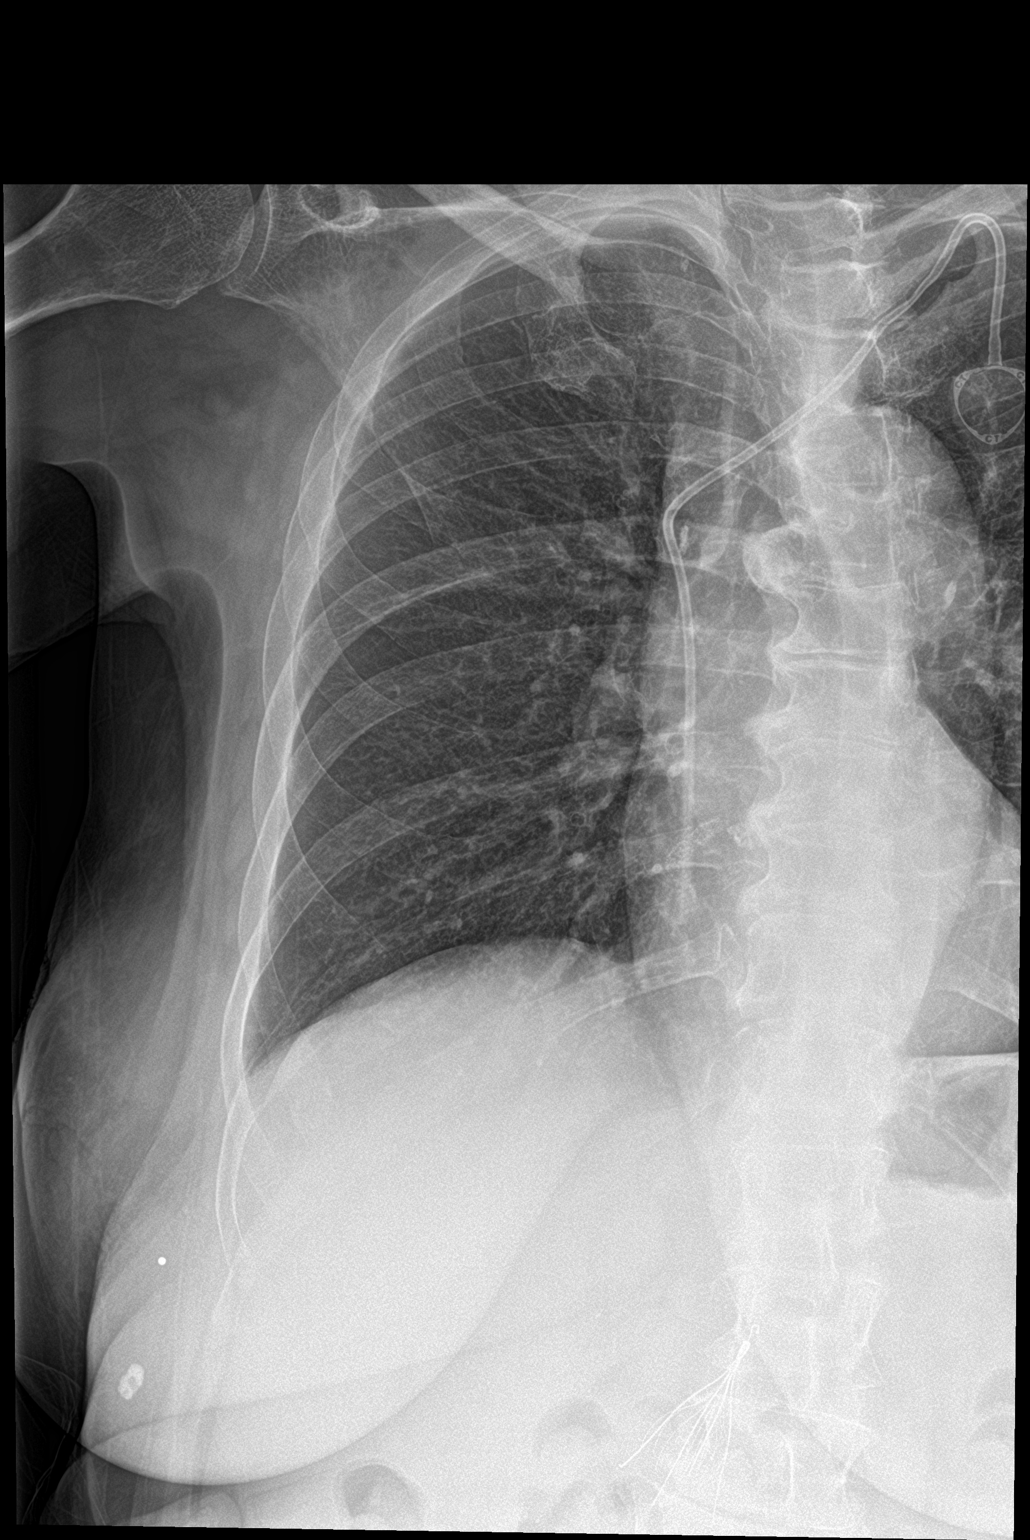

[2 of 2 positions shown; findings below may reference images not displayed]

FINDINGS: Grossly unchanged cardiac silhouette and mediastinal contours with
atherosclerotic plaque within the thoracic aorta. Stable position of
support apparatus. No focal airspace opacities. No pleural effusion
or pneumothorax. No evidence of edema.

No definite displaced right-sided rib fractures with special
attention paid to the area demarcated by the radiopaque BB.

Dystrophic calcification noted about the caudal aspect the right
breast. Post IVC filter placement.
IMPRESSION: No definite displaced right-sided rib fracture with special
attention paid to the area demarcated by the radiopaque BB.

## 2017-05-11 ENCOUNTER — Telehealth: Payer: Self-pay

## 2017-05-12 NOTE — Telephone Encounter (Signed)
Opened in error

## 2017-07-06 ENCOUNTER — Ambulatory Visit: Payer: Medicare Other | Admitting: Radiation Oncology

## 2017-11-21 IMAGING — MR MR ABDOMEN WO/W CM MRCP
12 of 19 series · 26 of 48 positions shown · IV contrast (multihance)
Comparison: PET-CT 10/13/2016.  Abdominal CT 02/04/2017.

CLINICAL DATA: Right breast cancer with hepatic metastatic disease.
Elevated liver function studies.

EXAM:
MRI ABDOMEN WITHOUT AND WITH CONTRAST (INCLUDING MRCP)
TECHNIQUE: Multiplanar multisequence MR imaging of the abdomen was performed
both before and after the administration of intravenous contrast.
Heavily T2-weighted images of the biliary and pancreatic ducts were
obtained, and three-dimensional MRCP images were rendered by post
processing.
CONTRAST:  13mL MULTIHANCE GADOBENATE DIMEGLUMINE 529 MG/ML IV SOLN

[Series 4: T2 fat-sat · axial · 7.0mm · 0.74mm/px · z∈[-109,+126]mm · 2 of 29 slices shown]
[im 1/29]
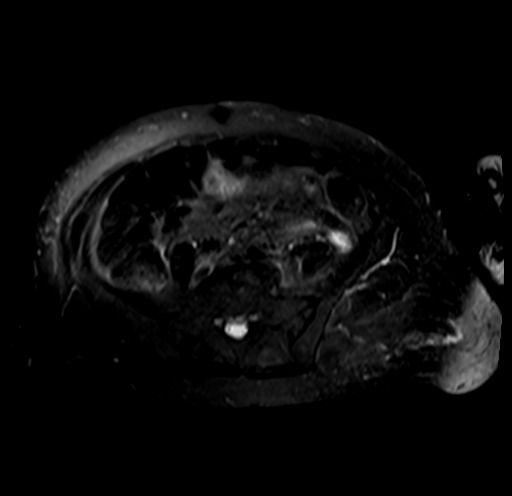
[im 29/29]
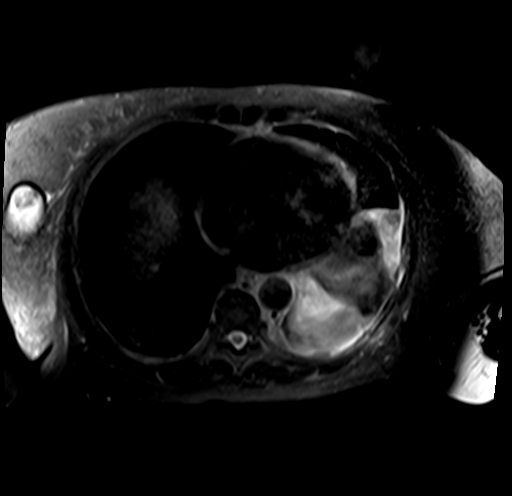

[Series 5: T2 · coronal · 8.0mm · 0.78mm/px · 2 of 21 slices shown (1 of 2)]
[im 1/21]
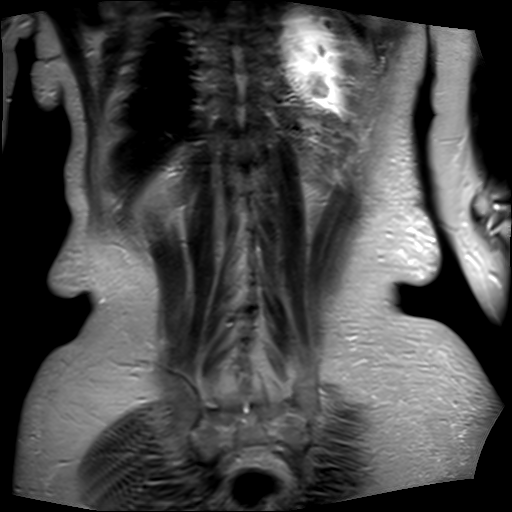
[im 21/21]
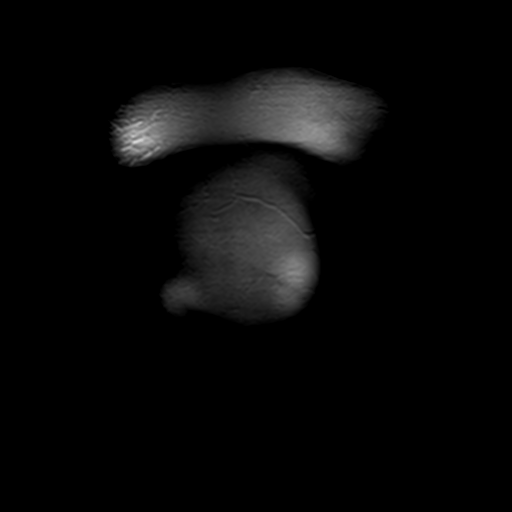

[Series 6: ax dual echo · axial · 7.0mm · 0.74mm/px · z∈[-105,+130]mm · 3 of 58 slices shown]
[im 1/58]
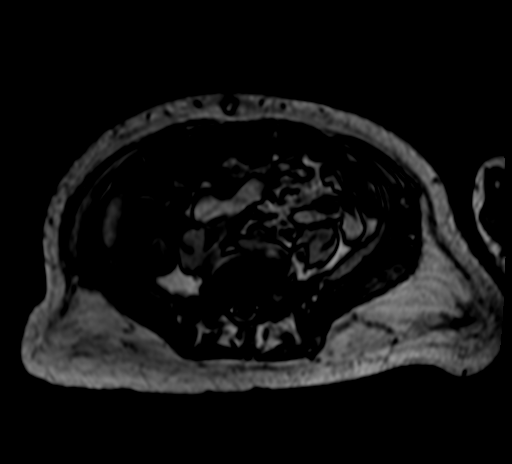
[im 29/58]
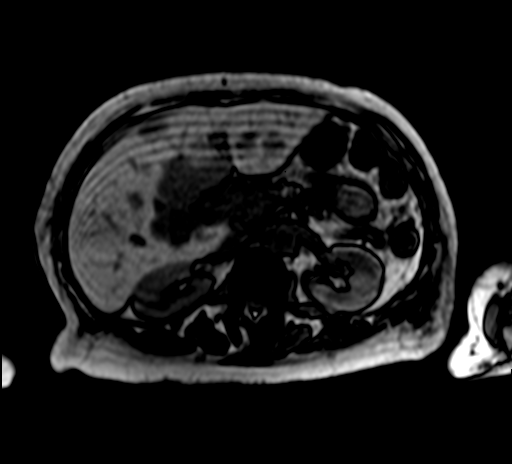
[im 58/58]
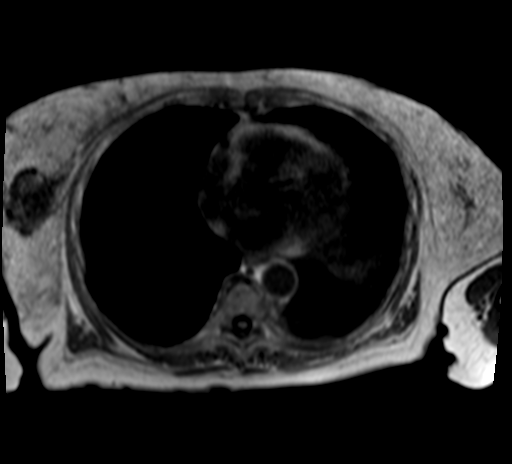

[Series 11: cor tru fisp · coronal · 4.0mm · 0.74mm/px · 2 of 48 slices shown]
[im 1/48]
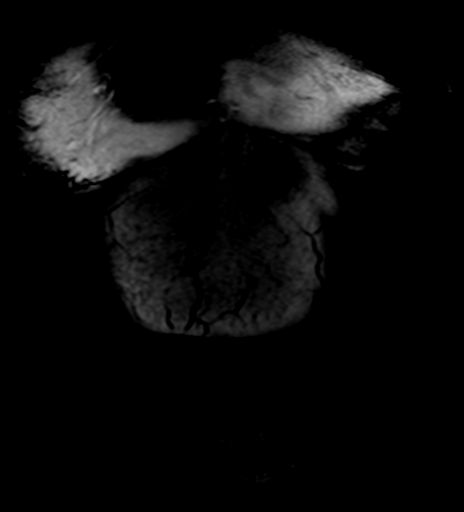
[im 48/48]
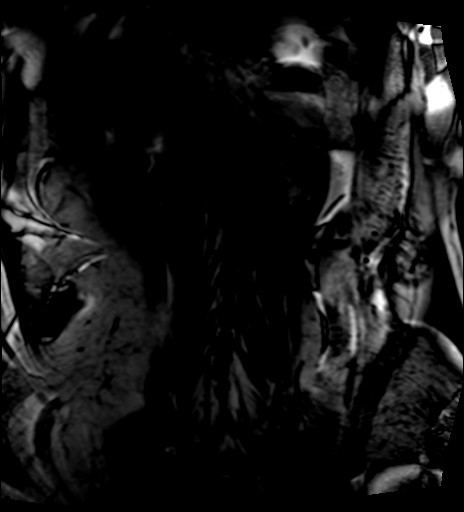

[Series 15: cor thins · coronal · 4.0mm · 0.89mm/px · 1 of 11 slices shown]
[im 1/11]
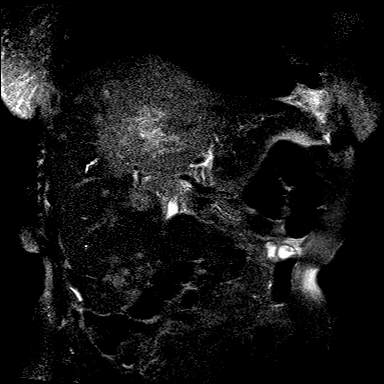

[Series 17: T2 · axial · 7.0mm · 0.74mm/px · 1 of 29 slices shown (2 of 2)]
[im 1/29]
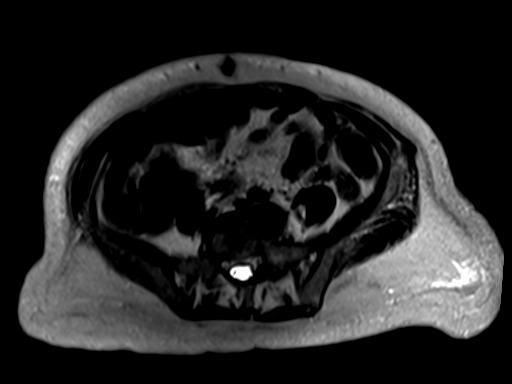

[Series 18: DWI · axial · 6.0mm · 1.77mm/px · z∈[-110,+135]mm · 4 of 105 slices shown]
[im 1/105]
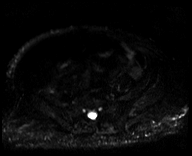
[im 35/105]
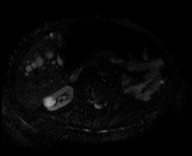
[im 70/105]
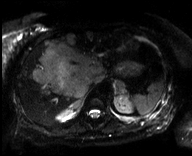
[im 105/105]
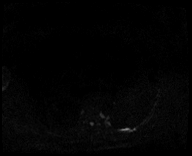

[Series 19: ax dwi_adc · axial · 6.0mm · 1.77mm/px · 1 of 35 slices shown]
[im 1/35]
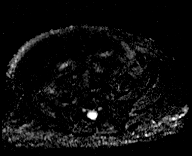

[Series 20: T1 dynamic fat-sat · axial · non-contrast · 2.5mm · 0.74mm/px · z∈[-106,+131]mm · 3 of 96 slices shown (1 of 3)]
[im 1/96]
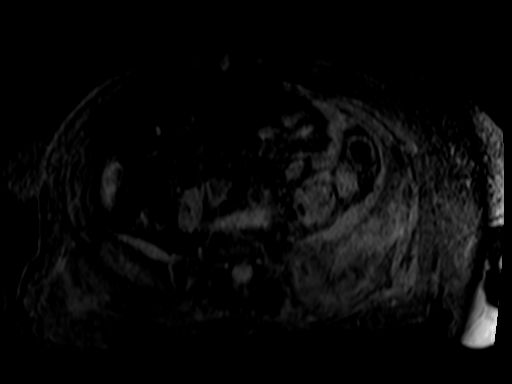
[im 48/96]
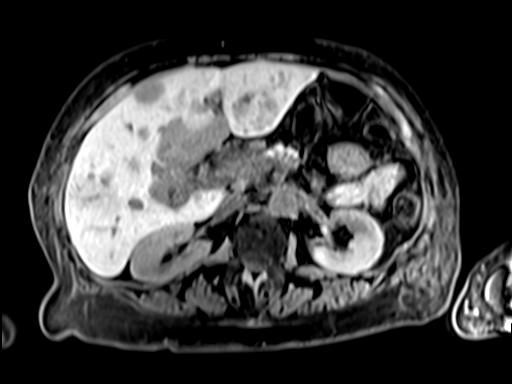
[im 96/96]
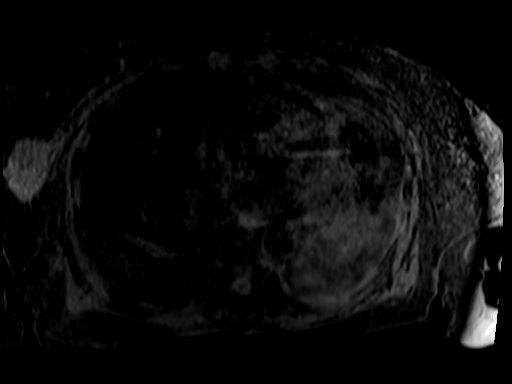

[Series 21: T1 dynamic fat-sat · axial · 2.5mm · 0.74mm/px · z∈[-106,+131]mm · 3 of 96 slices shown (2 of 3)]
[im 1/96]
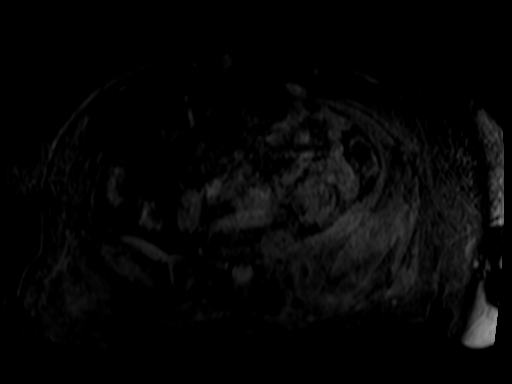
[im 48/96]
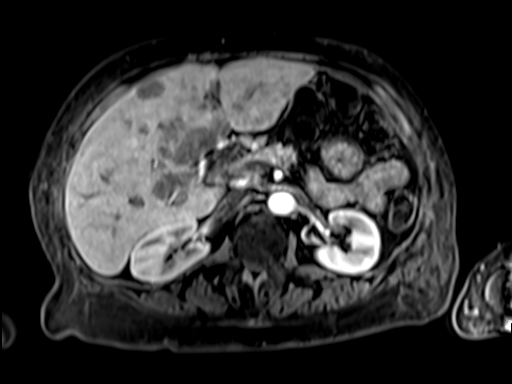
[im 96/96]
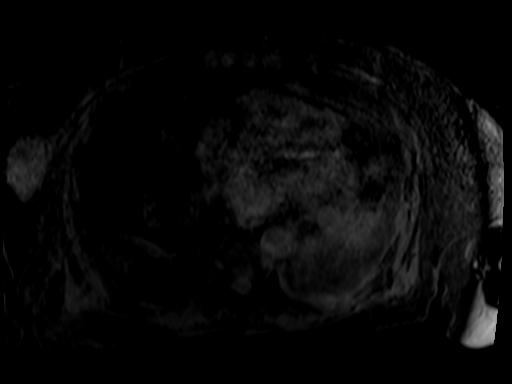

[Series 22: T1 dynamic fat-sat · axial · 2.5mm · 0.74mm/px · z∈[-106,+131]mm · 3 of 96 slices shown (3 of 3)]
[im 1/96]
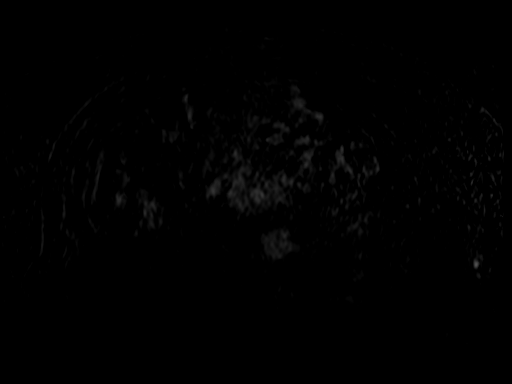
[im 48/96]
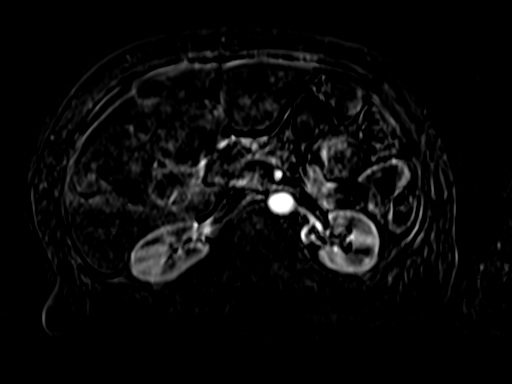
[im 96/96]
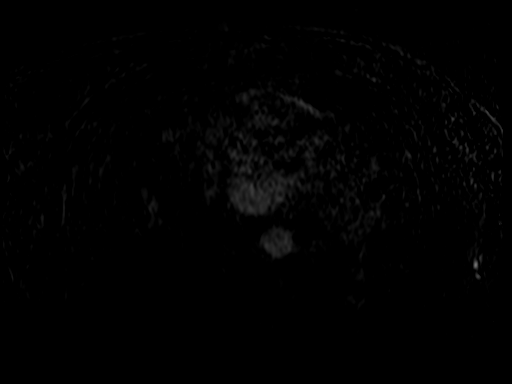

[Series 23: T1 dynamic fat-sat post-contrast · axial · 2.5mm · 0.74mm/px · 1 of 96 slices shown]
[im 1/96]
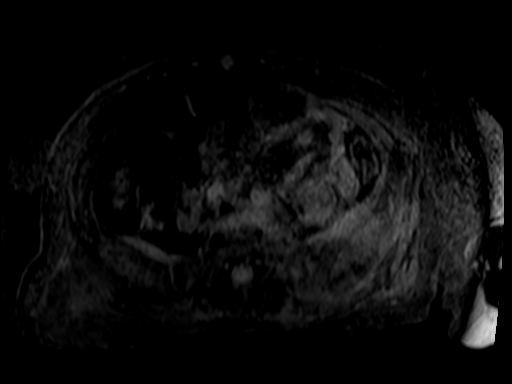

[26 of 48 positions shown; findings below may reference images not displayed]

FINDINGS: Lower chest: Complex left pleural effusion is again partially imaged
with irregular pleural thickening and enhancement, likely malignant.
No significant pleural fluid on the right. Grossly stable fluid
collection laterally in the right breast without suspicious
enhancement.

Hepatobiliary: Again demonstrated is widespread hepatic metastatic
disease with a large conglomerate mass centrally in the liver
measuring up to 11.9 x 10.0 x 13.1 cm. This partially encases and
exerts mass effect on the hepatic and portal veins as well as the
IVC. No tumor thrombus identified. There is probable extrinsic
compression of the biliary system in the hepatic hilum. Mild
intrahepatic biliary dilatation is present. The common bile duct is
decompressed. The gallbladder is collapsed. Apart from the dominant
central liver mass, there are multiple smaller lesions, similar to
recent CT.

Pancreas: Unremarkable. No pancreatic ductal dilatation or
surrounding inflammatory changes.

Spleen: Normal in size without focal abnormality.

Adrenals/Urinary Tract: Small bilateral adrenal masses are grossly
stable from previous studies and were not hypermetabolic on PET-CT,
probably adenomas or hyperplasia. There are several cysts within the
lower pole of the right kidney. The left kidney appears normal. No
hydronephrosis.

Stomach/Bowel: No evidence of bowel wall thickening, distention or
surrounding inflammatory change.

Vascular/Lymphatic: Stable adenopathy within the porta hepatis,
potentially contributing to mass effect on the biliary system. No
acute vascular findings. As above, there is extrinsic mass effect on
the intrahepatic vasculature by the large central hepatic mass.
Aortic and branch vessel atherosclerosis better demonstrated by CT.
IVC filter noted.

Other: No ascites or peritoneal nodularity.

Musculoskeletal: No acute or significant osseous findings.
IMPRESSION: 1. Large conglomerate central hepatic metastases likely contribute
to extrinsic compression of the biliary system in the porta hepatis,
contributing to the patient's elevated liver function studies. The
common bile duct is decompressed.
2. Probable metastatic lymphadenopathy within the porta hepatis.
3. Probable malignant left pleural effusion.
4. Stable bilateral adrenal adenomas or hyperplasia.

## 2023-10-17 ENCOUNTER — Other Ambulatory Visit (HOSPITAL_COMMUNITY): Payer: Self-pay
